# Patient Record
Sex: Female | Born: 1975 | Race: Black or African American | Hispanic: No | Marital: Single | Smoking: Former smoker
Health system: Southern US, Community
[De-identification: ages and names within clinical notes are randomized; demographics above are authoritative.]

## PROBLEM LIST (undated history)

## (undated) DIAGNOSIS — C801 Malignant (primary) neoplasm, unspecified: Secondary | ICD-10-CM

## (undated) DIAGNOSIS — Z800 Family history of malignant neoplasm of digestive organs: Secondary | ICD-10-CM

## (undated) DIAGNOSIS — M1990 Unspecified osteoarthritis, unspecified site: Secondary | ICD-10-CM

## (undated) DIAGNOSIS — Z803 Family history of malignant neoplasm of breast: Secondary | ICD-10-CM

## (undated) HISTORY — DX: Unspecified osteoarthritis, unspecified site: M19.90

## (undated) HISTORY — DX: Family history of malignant neoplasm of digestive organs: Z80.0

## (undated) HISTORY — DX: Malignant (primary) neoplasm, unspecified: C80.1

## (undated) HISTORY — DX: Family history of malignant neoplasm of breast: Z80.3

---
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
## 2018-09-30 DIAGNOSIS — C50912 Malignant neoplasm of unspecified site of left female breast: Secondary | ICD-10-CM | POA: Diagnosis not present

## 2018-09-30 DIAGNOSIS — N644 Mastodynia: Secondary | ICD-10-CM | POA: Diagnosis not present

## 2018-09-30 DIAGNOSIS — R05 Cough: Secondary | ICD-10-CM | POA: Diagnosis not present

## 2018-09-30 DIAGNOSIS — F17200 Nicotine dependence, unspecified, uncomplicated: Secondary | ICD-10-CM | POA: Diagnosis not present

## 2018-09-30 DIAGNOSIS — R0602 Shortness of breath: Secondary | ICD-10-CM | POA: Diagnosis not present

## 2018-09-30 DIAGNOSIS — C801 Malignant (primary) neoplasm, unspecified: Secondary | ICD-10-CM | POA: Diagnosis not present

## 2018-09-30 DIAGNOSIS — J90 Pleural effusion, not elsewhere classified: Secondary | ICD-10-CM | POA: Diagnosis not present

## 2018-09-30 DIAGNOSIS — C7800 Secondary malignant neoplasm of unspecified lung: Secondary | ICD-10-CM | POA: Diagnosis not present

## 2018-09-30 DIAGNOSIS — C7981 Secondary malignant neoplasm of breast: Secondary | ICD-10-CM | POA: Diagnosis not present

## 2018-10-10 ENCOUNTER — Ambulatory Visit (INDEPENDENT_AMBULATORY_CARE_PROVIDER_SITE_OTHER): Payer: BLUE CROSS/BLUE SHIELD | Admitting: Family Medicine

## 2018-10-10 ENCOUNTER — Encounter: Payer: Self-pay | Admitting: Family Medicine

## 2018-10-10 VITALS — BP 148/88 | HR 96 | Temp 99.7°F | Ht 64.0 in | Wt 270.0 lb

## 2018-10-10 DIAGNOSIS — Z7689 Persons encountering health services in other specified circumstances: Secondary | ICD-10-CM

## 2018-10-10 DIAGNOSIS — C50919 Malignant neoplasm of unspecified site of unspecified female breast: Secondary | ICD-10-CM | POA: Insufficient documentation

## 2018-10-10 DIAGNOSIS — N6320 Unspecified lump in the left breast, unspecified quadrant: Secondary | ICD-10-CM | POA: Diagnosis not present

## 2018-10-10 DIAGNOSIS — C50819 Malignant neoplasm of overlapping sites of unspecified female breast: Secondary | ICD-10-CM

## 2018-10-10 NOTE — Progress Notes (Signed)
Subjective:  Patient ID: Sharon Floyd, female    DOB: 1976/06/14, 43 y.o.   MRN: [id number]  Chief Complaint:  Establish Care (went to [facility] ER, needs referral)   HPI: Sharon Floyd is a 43 y.o. female presenting on 10/10/2018 for Establish Care (went to [facility] ER, needs referral)   1. Left breast mass   2. Encounter to establish care   Pt presents today to establish care and to get a referral to oncology. States she was seen at [facility] in the ED on 09/30/2018 for ongoing cough and left upper chest pain with left breast swelling. Pt states she was told she had metastatic breast cancer. She states she noticed left breast swelling 2 days prior to her ED visit.  States she had had intermittent left breast swelling with slight tenderness, she states this always subsided. Pt states she has had the cough and exertional shortness of breath since 09/28/2018. States she has not had fever, chills, unexplained weight loss, or weakness. She states her appetite has been great. States she does have a difficult time getting comfortable to sleep due to the breast swelling and pressure. She states she has not noticed any drainage from her breast. No significant family history of cancer. She stases her last PAP was normal. She has had a colposcopy in the past due to an abnormal PAP. She states that was years ago. She denies use of birth control or hormones. States she use to get Depo injections but stopped these several years ago.  Relevant past medical, surgical, family, and social history reviewed and updated as indicated.  Allergies and medications reviewed and updated.   Past Medical History:  Diagnosis Date  . Arthritis   . Cancer [facility])    breast    Past Surgical History:  Procedure Laterality Date  . PATELLA ARTHROPLASTY Left 1992    Social History   Socioeconomic History  . Marital status: Single    Spouse name: Not on file  . Number of children: Not on file  .  Years of education: Not on file  . Highest education level: Not on file  Occupational History  . Not on file  Social Needs  . Financial resource strain: Not on file  . Food insecurity:    Worry: Not on file    Inability: Not on file  . Transportation needs:    Medical: Not on file    Non-medical: Not on file  Tobacco Use  . Smoking status: Former Smoker    Types: Cigarettes    Last attempt to quit: 10/09/2018  . Smokeless tobacco: Never Used  Substance and Sexual Activity  . Alcohol use: Not Currently  . Drug use: Never  . Sexual activity: Not on file  Lifestyle  . Physical activity:    Days per week: Not on file    Minutes per session: Not on file  . Stress: Not on file  Relationships  . Social connections:    Talks on phone: Not on file    Gets together: Not on file    Attends religious service: Not on file    Active member of club or organization: Not on file    Attends meetings of clubs or organizations: Not on file    Relationship status: Not on file  . Intimate partner violence:    Fear of current or ex partner: Not on file    Emotionally abused: Not on file    Physically abused: Not  on file    Forced sexual activity: Not on file  Other Topics Concern  . Not on file  Social History Narrative  . Not on file    Outpatient Encounter Medications as of 10/10/2018  Medication Sig  . naproxen sodium (ALEVE) 220 MG tablet Take 220 mg by mouth daily as needed.   No facility-administered encounter medications on file as of 10/10/2018.     Allergies  Allergen Reactions  . Aspirin     Review of Systems  Constitutional: Negative for activity change, appetite change, chills, fatigue, fever and unexpected weight change.  Respiratory: Positive for cough, chest tightness, shortness of breath and wheezing.   Cardiovascular: Negative for chest pain, palpitations and leg swelling.  Gastrointestinal: Negative for abdominal pain.  Genitourinary: Negative for pelvic pain,  vaginal bleeding and vaginal pain.  Neurological: Negative for dizziness, weakness and headaches.  Hematological: Positive for adenopathy.  Psychiatric/Behavioral: Negative for confusion.  All other systems reviewed and are negative.       Objective:  BP (!) 148/88   Pulse 96   Temp 99.7 F (37.6 C) (Oral)   Ht 5\' 4"  (1.626 m)   Wt 270 lb (122.5 kg)   BMI 46.35 kg/m    Wt Readings from Last 3 Encounters:  10/10/18 270 lb (122.5 kg)    Physical Exam Vitals signs and nursing note reviewed.  Constitutional:      General: She is not in acute distress.    Appearance: Normal appearance. She is obese. She is not ill-appearing or toxic-appearing.  HENT:     Head: Normocephalic and atraumatic.     Mouth/Throat:     Mouth: Mucous membranes are moist.     Pharynx: Oropharynx is clear.  Eyes:     Conjunctiva/sclera: Conjunctivae normal.     Pupils: Pupils are equal, round, and reactive to light.  Neck:     Musculoskeletal: Full passive range of motion without pain and neck supple.     Thyroid: No thyroid mass, thyromegaly or thyroid tenderness.  Cardiovascular:     Rate and Rhythm: Normal rate and regular rhythm.     Heart sounds: Normal heart sounds. No murmur. No friction rub. No gallop.   Pulmonary:     Effort: Respiratory distress (exertional shortness of breath) present.     Breath sounds: Examination of the right-upper field reveals wheezing. Examination of the left-upper field reveals wheezing. Wheezing (mild) present.  Chest:     Breasts: Breasts are asymmetrical.        Right: Normal.        Left: Swelling, mass (hard mass to upper breast quadrant), skin change (peau d' orange to entire breast) and tenderness present. No bleeding or nipple discharge.  Lymphadenopathy:     Cervical: No cervical adenopathy.     Upper Body:     Right upper body: No supraclavicular, axillary or pectoral adenopathy.     Left upper body: Supraclavicular adenopathy and axillary adenopathy  present.  Skin:    General: Skin is warm and dry.     Capillary Refill: Capillary refill takes less than 2 seconds.  Neurological:     Mental Status: She is alert and oriented to person, place, and time.  Psychiatric:        Mood and Affect: Mood normal.        Behavior: Behavior is cooperative.        Thought Content: Thought content normal.        Judgment: Judgment normal.  No results found for this or any previous visit.     Pertinent labs & imaging results that were available during my care of the patient were reviewed by me and considered in my medical decision making.  Assessment & Plan:  Anasia was seen today for establish care.  Diagnoses and all orders for this visit:  Left breast mass Urgent referral to oncology. Pt needs CPE. States she will set this up once she sees oncology and develops a treatment plan.  -     Ambulatory referral to Oncology    Continue all other maintenance medications.  Follow up plan: Return for CPE, ASAP.  Educational handout given for breast cancer  The above assessment and management plan was discussed with the patient. The patient verbalized understanding of and has agreed to the management plan. Patient is aware to call the clinic if symptoms persist or worsen. Patient is aware when to return to the clinic for a follow-up visit. Patient educated on when it is appropriate to go to the emergency department.   Monia Pouch, FNP-C [facility] [phone]

## 2018-10-10 NOTE — Patient Instructions (Signed)
Breast Cancer, Female  Breast cancer is a malignant growth of tissue (tumor) in the breast. Unlike noncancerous (benign) tumors, malignant tumors are cancerous and can spread to other parts of the body. The two most common types of breast cancer start in the milk ducts (ductal carcinoma) or in the lobules where milk is made in the breast (lobular carcinoma). Breast cancer is one of the most common types of cancer in women. What are the causes? The exact cause of female breast cancer is unknown. What increases the risk? The following factors may make you more likely to develop this condition:  Being older than 44 years of age.  Race and ethnicity. Caucasian women generally have an increased risk, but African-American women are more likely to develop the disease before age 18.  Having a family history of breast cancer.  Having had breast cancer in the past.  Having certain noncancerous conditions of the breast, such as dense breast tissue.  Having the BRCA1 and BRCA2 genes.  Having a history of radiation exposure.  Obesity.  Starting menopause after age 64.  Starting your menstrual periods before age 10.  Having never been pregnant or having your first child after age 53.  Having never breastfed.  Using hormone therapy after menopause.  Using birth control pills.  Drinking more than one alcoholic drink a day.  Exposure to the drug DES, which was given to pregnant women from the 1940s to the 1970s. What are the signs or symptoms? Symptoms of this condition include:  A painless lump or thickening in your breast.  Changes in the size or shape of your breast.  Breast skin changes, such as puckering or dimpling.  Nipple abnormalities, such as scaling, crustiness, redness, or pulling in (retraction).  Nipple discharge that is bloody or clear. How is this diagnosed? This condition may be diagnosed by:  Taking your medical history and doing a physical exam. During the  exam, your health care provider will feel the tissue around your breast and under your arms.  Taking a sample of nipple discharge. The sample will be examined under a microscope.  Performing imaging tests, such as breast X-rays (mammogram), breast ultrasound exams, or an MRI.  Taking a tissue sample (biopsy) from the breast. The sample will be examined under a microscope to look for cancer cells.  Taking a sample from the lymph nodes near the affected breast (sentinel node biopsy). Your cancer will be staged to determine its severity and extent. Staging is a careful attempt to find out the size of the tumor, whether the cancer has spread, and if so, to what parts of the body. Staging also includes testing your tumor for certain receptors, such as estrogen, progesterone, and human epidermal growth factor receptor 2 (HER2). This will help your cancer care team decide on a treatment that will work best for you. You may need to have more tests to determine the stage of your cancer. Stages include the following:  Stage 0-The tumor has not spread to other breast tissue.  Stage I-The cancer is only found in the breast or may be in the lymph nodes. The tumor may be up to  in (2 cm) wide.  Stage II-The cancer has spread to nearby lymph nodes. The tumor may be up to 2 in (5 cm) wide.  Stage III-The cancer has spread to more distant lymph nodes. The tumor may be larger than 2 in (5 cm) wide.  Stage IV-The cancer has spread to other parts of  the body, such as the bones, brain, liver, or lungs. How is this treated? Treatment for this condition depends on the type and stage of the breast cancer. It may be treated with:  Surgery. This may involve breast-conserving surgery (lumpectomy or partial mastectomy) in which only the part of the breast containing the cancer is removed. Some normal tissue surrounding this area may also be removed. In some cases, surgery may be done to remove the entire breast  (mastectomy) and nipple. Lymph nodes may also be removed.  Radiation therapy, which uses high-energy rays to kill cancer cells.  Chemotherapy, which is the use of drugs to kill cancer cells.  Hormone therapy, which involves taking medicine to adjust the hormone levels in your body. You may take medicine to decrease your estrogen levels. This can help stop cancer cells from growing.  Targeted therapy, in which drugs are used to block the growth and spread of cancer cells. These drugs target a specific part of the cancer cell and usually cause fewer side effects than chemotherapy. Targeted therapy may be used alone or in combination with chemotherapy.  A combination of surgery, radiation, chemotherapy, or hormone therapy may be needed to treat breast cancer. Follow these instructions at home:  Take over-the-counter and prescription medicines only as told by your health care provider.  Eat a healthy diet. A healthy diet includes lots of fruits and vegetables, low-fat dairy products, lean meats, and fiber. ? Make sure half your plate is filled with fruits or vegetables. ? Choose high-fiber foods such as whole-grain breads and cereals.  Consider joining a support group. This may help you learn to cope with the stress of having breast cancer.  Talk to your health care team about exercise and physical activity. The right exercise program can: ? Help prevent or reduce symptoms such as fatigue or depression. ? Improve overall health and survival rates.  Keep all follow-up visits as told by your health care provider. This is important. Where to find more information  American Cancer Society: www.cancer.org  National Cancer Institute: www.cancer.gov Contact a health care provider if:  You have a sudden increase in pain.  You have any symptoms or changes that concern you.  You lose weight without trying.  You notice a new lump in either breast or under your arm.  You develop swelling in  either arm or hand.  You have a fever.  You notice new fatigue or weakness. Get help right away if:  You have chest pain or trouble breathing.  You faint. Summary  Breast cancer is a malignant growth of tissue (tumor) in the breast.  Your cancer will be staged to determine its severity and extent.  Treatment for this condition depends on the type and stage of the breast cancer. This information is not intended to replace advice given to you by your health care provider. Make sure you discuss any questions you have with your health care provider. Document Released: 11/03/2005 Document Revised: 03/21/2017 Document Reviewed: 03/21/2017 Elsevier Interactive Patient Education  2019 Elsevier Inc.  

## 2018-10-11 ENCOUNTER — Encounter: Payer: Self-pay | Admitting: *Deleted

## 2018-10-11 ENCOUNTER — Other Ambulatory Visit: Payer: Self-pay

## 2018-10-11 ENCOUNTER — Encounter: Payer: Self-pay | Admitting: Hematology

## 2018-10-11 NOTE — Progress Notes (Signed)
Reached out to [employer] to introduce myself as the office RN Navigator and explain our new patient process. Reviewed the reason for their referral and scheduled their new patient appointment along with labs. Provided address and directions to the office including call back phone number. Reviewed with patient any concerns they may have or any possible barriers to attending their appointment.   Informed patient about my role as a navigator and that I will meet with them prior to their New Patient appointment and more fully discuss what services I can provide. At this time patient has no further questions or needs.

## 2018-10-12 ENCOUNTER — Ambulatory Visit (INDEPENDENT_AMBULATORY_CARE_PROVIDER_SITE_OTHER): Payer: BLUE CROSS/BLUE SHIELD | Admitting: Pulmonary Disease

## 2018-10-12 ENCOUNTER — Inpatient Hospital Stay (HOSPITAL_BASED_OUTPATIENT_CLINIC_OR_DEPARTMENT_OTHER): Payer: BLUE CROSS/BLUE SHIELD | Admitting: Hematology

## 2018-10-12 ENCOUNTER — Other Ambulatory Visit: Payer: Self-pay

## 2018-10-12 ENCOUNTER — Encounter: Payer: Self-pay | Admitting: Pulmonary Disease

## 2018-10-12 ENCOUNTER — Encounter: Payer: Self-pay | Admitting: Hematology

## 2018-10-12 ENCOUNTER — Inpatient Hospital Stay: Payer: BLUE CROSS/BLUE SHIELD | Attending: Hematology

## 2018-10-12 ENCOUNTER — Encounter: Payer: Self-pay | Admitting: *Deleted

## 2018-10-12 ENCOUNTER — Other Ambulatory Visit: Payer: Self-pay | Admitting: Hematology

## 2018-10-12 ENCOUNTER — Telehealth: Payer: Self-pay | Admitting: Hematology

## 2018-10-12 VITALS — BP 141/91 | HR 103 | Ht 65.0 in | Wt 280.0 lb

## 2018-10-12 VITALS — BP 145/99 | HR 101 | Resp 21 | Ht 65.0 in | Wt 268.5 lb

## 2018-10-12 DIAGNOSIS — C7931 Secondary malignant neoplasm of brain: Secondary | ICD-10-CM | POA: Insufficient documentation

## 2018-10-12 DIAGNOSIS — C7802 Secondary malignant neoplasm of left lung: Secondary | ICD-10-CM | POA: Insufficient documentation

## 2018-10-12 DIAGNOSIS — Z800 Family history of malignant neoplasm of digestive organs: Secondary | ICD-10-CM | POA: Diagnosis not present

## 2018-10-12 DIAGNOSIS — Z79899 Other long term (current) drug therapy: Secondary | ICD-10-CM | POA: Diagnosis not present

## 2018-10-12 DIAGNOSIS — N63 Unspecified lump in unspecified breast: Secondary | ICD-10-CM | POA: Diagnosis not present

## 2018-10-12 DIAGNOSIS — N6320 Unspecified lump in the left breast, unspecified quadrant: Secondary | ICD-10-CM

## 2018-10-12 DIAGNOSIS — C7801 Secondary malignant neoplasm of right lung: Secondary | ICD-10-CM | POA: Insufficient documentation

## 2018-10-12 DIAGNOSIS — Z87891 Personal history of nicotine dependence: Secondary | ICD-10-CM | POA: Insufficient documentation

## 2018-10-12 DIAGNOSIS — Z803 Family history of malignant neoplasm of breast: Secondary | ICD-10-CM | POA: Insufficient documentation

## 2018-10-12 DIAGNOSIS — J910 Malignant pleural effusion: Secondary | ICD-10-CM | POA: Insufficient documentation

## 2018-10-12 DIAGNOSIS — Z809 Family history of malignant neoplasm, unspecified: Secondary | ICD-10-CM

## 2018-10-12 DIAGNOSIS — C50919 Malignant neoplasm of unspecified site of unspecified female breast: Secondary | ICD-10-CM

## 2018-10-12 DIAGNOSIS — C50812 Malignant neoplasm of overlapping sites of left female breast: Secondary | ICD-10-CM

## 2018-10-12 DIAGNOSIS — D649 Anemia, unspecified: Secondary | ICD-10-CM | POA: Diagnosis not present

## 2018-10-12 DIAGNOSIS — G893 Neoplasm related pain (acute) (chronic): Secondary | ICD-10-CM | POA: Insufficient documentation

## 2018-10-12 DIAGNOSIS — R918 Other nonspecific abnormal finding of lung field: Secondary | ICD-10-CM

## 2018-10-12 DIAGNOSIS — F17200 Nicotine dependence, unspecified, uncomplicated: Secondary | ICD-10-CM

## 2018-10-12 LAB — CMP (CANCER CENTER ONLY)
ALT: 19 U/L (ref 0–44)
AST: 20 U/L (ref 15–41)
Albumin: 4.1 g/dL (ref 3.5–5.0)
Alkaline Phosphatase: 65 U/L (ref 38–126)
Anion gap: 9 (ref 5–15)
BUN: 11 mg/dL (ref 6–20)
CO2: 27 mmol/L (ref 22–32)
Calcium: 9.8 mg/dL (ref 8.9–10.3)
Chloride: 103 mmol/L (ref 98–111)
Creatinine: 0.99 mg/dL (ref 0.44–1.00)
GFR, Est AFR Am: >60 mL/min (ref >60)
GFR, Estimated: >60 mL/min (ref >60)
Glucose, Bld: 111 mg/dL — ABNORMAL HIGH (ref 70–99)
Potassium: 4.2 mmol/L (ref 3.5–5.1)
Sodium: 139 mmol/L (ref 135–145)
Total Bilirubin: 0.5 mg/dL (ref 0.3–1.2)
Total Protein: 7.7 g/dL (ref 6.5–8.1)

## 2018-10-12 LAB — CBC WITH DIFFERENTIAL (CANCER CENTER ONLY)
Abs Immature Granulocytes: 0.04 10*3/uL (ref 0.00–0.07)
Basophils Absolute: 0 10*3/uL (ref 0.0–0.1)
Basophils Relative: 0 %
EOS PCT: 0 %
Eosinophils Absolute: 0 10*3/uL (ref 0.0–0.5)
HCT: 40.3 % (ref 36.0–46.0)
Hemoglobin: 13 g/dL (ref 12.0–15.0)
Immature Granulocytes: 1 %
Lymphocytes Relative: 20 %
Lymphs Abs: 1.5 10*3/uL (ref 0.7–4.0)
MCH: 29.1 pg (ref 26.0–34.0)
MCHC: 32.3 g/dL (ref 30.0–36.0)
MCV: 90.4 fL (ref 80.0–100.0)
Monocytes Absolute: 0.8 10*3/uL (ref 0.1–1.0)
Monocytes Relative: 10 %
Neutro Abs: 5.1 10*3/uL (ref 1.7–7.7)
Neutrophils Relative %: 69 %
Platelet Count: 504 10*3/uL — ABNORMAL HIGH (ref 150–400)
RBC: 4.46 MIL/uL (ref 3.87–5.11)
RDW: 12.5 % (ref 11.5–15.5)
WBC Count: 7.4 10*3/uL (ref 4.0–10.5)
nRBC: 0 % (ref 0.0–0.2)

## 2018-10-12 MED ORDER — LORAZEPAM 0.5 MG PO TABS: 0.2500 mg | ORAL_TABLET | Freq: Once | ORAL | 0 refills | Status: DC | PRN

## 2018-10-12 NOTE — Telephone Encounter (Signed)
Scheduled appt per 3/5 sch message - -pt aware of appt date and time   

## 2018-10-12 NOTE — Patient Instructions (Addendum)
Breast mass and multiple lung masses: > we are going to arrange for a breast mass biopsy

## 2018-10-12 NOTE — Progress Notes (Signed)
Synopsis: Referred in March 2020 for evaluation of a breast mass with multiple lung metastases.  Subjective:   PATIENT ID: Sharon Floyd GENDER: female DOB: 09/28/1975, MRN: [id number]   HPI  Chief Complaint  Patient presents with  . Pulm Consult    Referred by Dr. Maylon Peppers.     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  She has smoked cigarettes up until about 2 days ago, less than a pack a day for many years.  She says that as a child she had asthma but she was never told that she had any severe symptoms.  She has considered herself healthy for most of her adult life.  However, she says that after her last menstrual period she had a persistence of pain and swelling in her left breast which was more than normal and then she developed a cough.  She went to be evaluated at an outside facility where she was found to have a lung mass as well as a large mass invading her pectoral muscle.  A CT scan was performed which showed evidence of a large mediastinal mass.  She was referred to oncology who asked that we see her today.  She says that she does not take any blood thinners.  She says that her cough will sometimes be productive, she does not have hemoptysis, fevers, chills.  She says that she feels like she is short of breath because there is a mass pressing down on the left side of her chest.  Past Medical History:  Diagnosis Date  . Arthritis   . Cancer [facility])    breast     Family History  Problem Relation Age of Onset  . Arthritis Mother   . Diabetes Father   . Hypertension Father   . Arthritis Father   . Heart disease Father   . Early death Maternal Grandmother   . Asthma Maternal Grandfather   . Heart disease Maternal Grandfather   . Migraines Maternal Grandfather   . Early death Maternal Grandfather   . Asthma Paternal Grandmother   . Hypertension Paternal Grandmother   .  Asthma Paternal Grandfather   . Hypertension Paternal Grandfather      Social History   Socioeconomic History  . Marital status: Single    Spouse name: Not on file  . Number of children: Not on file  . Years of education: Not on file  . Highest education level: Not on file  Occupational History  . Not on file  Social Needs  . Financial resource strain: Not on file  . Food insecurity:    Worry: Not on file    Inability: Not on file  . Transportation needs:    Medical: Not on file    Non-medical: Not on file  Tobacco Use  . Smoking status: Former Smoker    Types: Cigarettes    Last attempt to quit: 10/09/2018  . Smokeless tobacco: Never Used  Substance and Sexual Activity  . Alcohol use: Not Currently  . Drug use: Never  . Sexual activity: Not on file  Lifestyle  . Physical activity:    Days per week: Not on file    Minutes per session: Not on file  . Stress: Not on file  Relationships  . Social connections:    Talks on phone: Not on file    Gets together: Not on file  Attends religious service: Not on file    Active member of club or organization: Not on file    Attends meetings of clubs or organizations: Not on file    Relationship status: Not on file  . Intimate partner violence:    Fear of current or ex partner: Not on file    Emotionally abused: Not on file    Physically abused: Not on file    Forced sexual activity: Not on file  Other Topics Concern  . Not on file  Social History Narrative  . Not on file     Allergies  Allergen Reactions  . Aspirin      Outpatient Medications Prior to Visit  Medication Sig Dispense Refill  . LORazepam (ATIVAN) 0.5 MG tablet Take 0.5 tablets (0.25 mg total) by mouth once as needed for up to 2 doses for anxiety (Prior to PET and MRI scan). 1 tablet 0  . naproxen sodium (ALEVE) 220 MG tablet Take 220 mg by mouth daily as needed.     No facility-administered medications prior to visit.     Review of Systems    Constitutional: Positive for malaise/fatigue. Negative for chills, fever and weight loss.  HENT: Negative for congestion, nosebleeds, sinus pain and sore throat.   Eyes: Negative for photophobia, pain and discharge.  Respiratory: Positive for cough, sputum production and shortness of breath. Negative for hemoptysis and wheezing.   Cardiovascular: Negative for chest pain, palpitations, orthopnea and leg swelling.  Gastrointestinal: Negative for abdominal pain, constipation, diarrhea, nausea and vomiting.  Genitourinary: Negative for dysuria, frequency, hematuria and urgency.  Musculoskeletal: Negative for back pain, joint pain, myalgias and neck pain.  Skin: Negative for itching and rash.  Neurological: Negative for tingling, tremors, sensory change, speech change, focal weakness, seizures, weakness and headaches.  Psychiatric/Behavioral: Negative for memory loss, substance abuse and suicidal ideas. The patient is not nervous/anxious.       Objective:  Physical Exam   Vitals:   10/12/18 1411  BP: (!) 141/91  Pulse: (!) 103  SpO2: 92%  Weight: 280 lb (127 kg)  Height: 5\' 5"  (1.651 m)   Gen: well appearing, no acute distress HENT: NCAT, OP clear, neck supple without masses Eyes: PERRL, EOMi Lymph: no cervical lymphadenopathy PULM: CTA B CV: RRR, no mgr, no JVD GI: BS+, soft, nontender, no hsm Derm: no rash or skin breakdown MSK: normal bulk and tone Neuro: A&Ox4, CN II-XII intact, strength 5/5 in all 4 extremities Psyche: normal mood and affect   CBC    Component Value Date/Time   WBC 7.4 10/12/2018 1030   RBC 4.46 10/12/2018 1030   HGB 13.0 10/12/2018 1030   HCT 40.3 10/12/2018 1030   PLT 504 (H) 10/12/2018 1030   MCV 90.4 10/12/2018 1030   MCH 29.1 10/12/2018 1030   MCHC 32.3 10/12/2018 1030   RDW 12.5 10/12/2018 1030   LYMPHSABS 1.5 10/12/2018 1030   MONOABS 0.8 10/12/2018 1030   EOSABS 0.0 10/12/2018 1030   BASOSABS 0.0 10/12/2018 1030     Chest  imaging: February 2020 CT chest images independently reviewed showing innumerable pulmonary masses bilaterally, scattered nodules, bronchovascular distribution, there is mediastinal and hilar adenopathy seen as well.  PFT:  Labs:  Path:  Echo:  Heart Catheterization:       Assessment & Plan:   Breast mass in female - Plan: [place] FNA SOFT TISSUE  Lung mass  Smoker  Discussion: Orvan Falconer presents today for evaluation of multiple pulmonary masses as well as  mediastinal adenopathy in the setting of a large breast mass.  We are happy to perform a navigational bronchoscopy and endoscopic ultrasound guided needle aspiration of her lymphadenopathy to obtain a tissue diagnosis.  Based on her smoking history and the need to obtain ample tissue for molecular and genetic studies  Plan: Breast mass and multiple lung masses: > After discussing the images with my partners we think the best approach is to obtain a core biopsy of the left breast mass > If we are unable to obtain enough tissue for molecular/hormonal receptor testing then we are happy to perform a bronchoscopy to obtain tissue from the lung metastasis.    Current Outpatient Medications:  .  LORazepam (ATIVAN) 0.5 MG tablet, Take 0.5 tablets (0.25 mg total) by mouth once as needed for up to 2 doses for anxiety (Prior to PET and MRI scan)., Disp: 1 tablet, Rfl: 0 .  naproxen sodium (ALEVE) 220 MG tablet, Take 220 mg by mouth daily as needed., Disp: , Rfl:

## 2018-10-12 NOTE — Progress Notes (Signed)
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 -Late 09/2018: presented to [facility] ER for several weeks of cough; exam notable for left breast skin changes; CT showed dermal thickening and multiple masses within the left breast and a 6cm mass invading the left pectoralis muscle, consistent with primary breast malignancy, extensive thoracic adenopathy and numerous pulmonary metastases, and an indeterminate subcentimeter lucency in the dome of right liver  ASSESSMENT & PLAN:   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  She underwent CT chest, which showed dermal thickening and multiple masses within the left breast as well as a 6 cm mass invading the left pectoralis muscle, consistent with breast malignancy.  In addition, there was extensive thoracic adenopathy and multiple pulmonary metastases as well as an indeterminant subcentimeter lucency in the dome of the right liver. -I reviewed imaging results in detail with the patient -Given the suspected extensive pulmonary metastases and thoracic adenopathy, I have referred the patient to pulmonary medicine for urgent evaluation of biopsy to confirm malignancy and for hormone receptor studies, assuming that this is primary breast malignancy -In addition, I have ordered PET scan and MRI brain to assess for evidence of other metastatic  disease -Anticipating that she is going to require chemotherapy, I have also ordered TTE to assess baseline cardiac function  -Pending the imaging studies and biopsy results, we will determine the treatment options and prognosis  Family history of malignancy  -There is no 1st degree relative with history of cancer, but there is malignancy in more distant relatives, including a 2nd degree aunt (colon cancer) and her aunt's son (breast cancer) -Therefore, I have referred the patient to genetics for evaluation of any hereditary syndrome, such as BRCA mutation   Orders Placed This Encounter  Procedures  . Ambulatory referral to Genetics    Referral Priority:   Urgent    Referral Type:   Consultation    Referral Reason:   Specialty Services Required    Number of Visits Requested:   1  . ECHOCARDIOGRAM COMPLETE    Standing Status:   Future    Standing Expiration Date:   01/12/2020    Order Specific Question:   Where should this test be performed    Answer:   [facility]    Order Specific Question:   Perflutren DEFINITY (image enhancing agent) should be administered unless hypersensitivity or allergy exist    Answer:   Administer Perflutren    Order Specific Question:   Reason for exam-Echo    Answer:   Chemotherapy evaluation  v87.41 / v58.11    Order Specific Question:   Reason for exam-Echo    Answer:   Chest Pain  786.50 / R07.9   A total of more than 60 minutes were spent face-to-face with the patient during this encounter and over half of that time was spent on counseling and coordination of care as outlined above.    All questions were  answered. The patient knows to call the clinic with any problems, questions or concerns.  Return in 1 week for clinic follow-up of the work-up above.   Tish Men, MD 10/12/2018 11:52 AM   CHIEF COMPLAINTS/PURPOSE OF CONSULTATION:  "My left breast is very uncomfortable"  HISTORY OF PRESENTING ILLNESS:  Sharon Floyd 43 y.o. female is here  because of suspected metastatic breast cancer to the lungs.  Patient presented to [facility] ED in 09/2018 for several weeks of cough, left upper chest pain, and left breast swelling.  On exam, her left breast was noticeably enlarged with skin changes, concerning for inflammatory breast cancer.  She underwent CT chest, which unfortunately showed extensive pulmonary metastases and thoracic adenopathy, as well as an indeterminant lucency in the dome of the right liver.  Patient was referred to oncology for further evaluation.  Patient reports that she has some difficulty with breathing at night when she lies flat due to the large left breast mass that weighs on her chest wall, and she has to reposition herself several times over the course of the night to be able to sleep.  She has tried breast binder without any relief.  She is on Aleve twice a day.  In addition, she reports mild exertional dyspnea over the past week.  She denies any fever, chill, night sweats, persistent chest pain, abdominal pain, nausea, vomiting, diarrhea.  I have reviewed her chart and materials related to her cancer extensively and collaborated history with the patient. Summary of oncologic history is as follows:   Breast cancer ([place])   09/30/2018 Imaging    CT chest with contrast (at [facility]): Impressions: 1.  Dermal thickening of the multiple masses within the left breast as well as a invasive 6 cm mass of the left pectoralis major, compatible with primary breast cancer. 2.  Numerous pulmonary metastases.  Left axillary, retropectoral, supraclavicular, and upper mediastinal metastatic lymphadenopathy.  Mildly enlarged right axillary and supraclavicular lymph nodes may be metastatic. 3.  Indeterminant subcentimeter lucency in the dome of the right liver. 4.  Small left pleural effusion.    10/10/2018 Initial Diagnosis    Breast cancer [facility])     MEDICAL HISTORY:  Past Medical History:  Diagnosis Date  . Arthritis    . Cancer [facility])    breast    SURGICAL HISTORY: Past Surgical History:  Procedure Laterality Date  . PATELLA ARTHROPLASTY Left 1992    SOCIAL HISTORY: Social History   Socioeconomic History  . Marital status: Single    Spouse name: Not on file  . Number of children: Not on file  . Years of education: Not on file  . Highest education level: Not on file  Occupational History  . Not on file  Social Needs  . Financial resource strain: Not on file  . Food insecurity:    Worry: Not on file    Inability: Not on file  . Transportation needs:    Medical: Not on file    Non-medical: Not on file  Tobacco Use  . Smoking status: Former Smoker    Types: Cigarettes    Last attempt to quit: 10/09/2018  . Smokeless tobacco: Never Used  Substance and Sexual Activity  . Alcohol use: Not Currently  . Drug use: Never  . Sexual activity: Not on file  Lifestyle  . Physical activity:    Days per week: Not on file    Minutes per session: Not on file  . Stress: Not on file  Relationships  . Social connections:    Talks on phone: Not on file    Gets together: Not on file    Attends religious service: Not on file    Active member of club or organization: Not on file    Attends meetings of clubs or organizations: Not on file    Relationship status: Not on file  . Intimate partner violence:    Fear of current or ex partner: Not on file    Emotionally abused: Not on file    Physically abused: Not on file    Forced sexual activity: Not on file  Other Topics Concern  . Not on file  Social History Narrative  . Not on file    FAMILY HISTORY: Family History  Problem Relation Age of Onset  . Arthritis Mother   . Diabetes Father   . Hypertension Father   . Arthritis Father   . Heart disease Father   . Early death Maternal Grandmother   . Asthma Maternal Grandfather   . Heart disease Maternal Grandfather   . Migraines Maternal Grandfather   . Early death Maternal Grandfather   .  Asthma Paternal Grandmother   . Hypertension Paternal Grandmother   . Asthma Paternal Grandfather   . Hypertension Paternal Grandfather     ALLERGIES:  is allergic to aspirin.  MEDICATIONS:  Current Outpatient Medications  Medication Sig Dispense Refill  . naproxen sodium (ALEVE) 220 MG tablet Take 220 mg by mouth daily as needed.    Marland Kitchen LORazepam (ATIVAN) 0.5 MG tablet Take 0.5 tablets (0.25 mg total) by mouth once as needed for up to 2 doses for anxiety (Prior to PET and MRI scan). 1 tablet 0   No current facility-administered medications for this visit.     REVIEW OF SYSTEMS:   Constitutional: ( - ) fevers, ( - )  chills , ( - ) night sweats Eyes: ( - ) blurriness of vision, ( - ) double vision, ( - ) watery eyes Ears, nose, mouth, throat, and face: ( - ) mucositis, ( - ) sore throat Respiratory: ( - ) cough, ( - ) dyspnea, ( - ) wheezes Cardiovascular: ( - ) palpitation, ( - ) chest discomfort, ( - ) lower extremity swelling Gastrointestinal:  ( - ) nausea, ( - ) heartburn, ( - ) change in bowel habits Skin: ( - ) abnormal skin rashes Lymphatics: ( - ) new lymphadenopathy, ( - ) easy bruising Neurological: ( - ) numbness, ( - ) tingling, ( - ) new weaknesses Behavioral/Psych: ( - ) mood change, ( - ) new changes  All other systems were reviewed with the patient and are negative.  PHYSICAL EXAMINATION: ECOG PERFORMANCE STATUS: 1 - Symptomatic but completely ambulatory  Vitals:   10/12/18 1000  BP: (!) 145/99  Pulse: (!) 101  Resp: (!) 21   Filed Weights   10/12/18 1000  Weight: 268 lb 8 oz (121.8 kg)    GENERAL: alert, no distress and comfortable, obese  SKIN: skin color, texture, turgor are normal, no rashes or significant lesions EYES: conjunctiva are pink and non-injected, sclera clear OROPHARYNX: no exudate, no erythema; lips, buccal mucosa, and tongue normal  NECK: supple, non-tender LYMPH:  palpable ~1.5cm left supraclavicular LN and left axillary  adenopathy LUNGS: clear to auscultation with normal breathing effort HEART: regular rate & rhythm, no murmurs, no lower extremity edema ABDOMEN: soft, non-tender, non-distended, normal bowel sounds Musculoskeletal: no cyanosis of digits and no clubbing  PSYCH: alert &  oriented x 3, fluent speech NEURO: no focal motor/sensory deficits BREASTS: a large, firm left breast with extensive peau d'orange changes in the entire breast, right breast unremarkable   LABORATORY DATA:  I have reviewed the data as listed Lab Results  Component Value Date   WBC 7.4 10/12/2018   HGB 13.0 10/12/2018   HCT 40.3 10/12/2018   MCV 90.4 10/12/2018   PLT 504 (H) 10/12/2018   Lab Results  Component Value Date   NA 139 10/12/2018   K 4.2 10/12/2018   CL 103 10/12/2018   CO2 27 10/12/2018    RADIOGRAPHIC STUDIES: I have personally reviewed the radiological images as listed and agreed with the findings in the report.  PATHOLOGY: I have reviewed the pathology reports as documented in the oncologist history.

## 2018-10-12 NOTE — Progress Notes (Signed)
Initial RN Navigator Patient Visit  Name: Sharon Floyd Date of Referral : 10/10/2018 Diagnosis: Metastatic Breast  Met with patient prior to their visit with MD. Hanley Seamen patient "Your Patient Navigator" handout which explains my role, areas in which I am able to help, and all the contact information for myself and the office. Also gave patient MD and Navigator business card. Reviewed with patient the general overview of expected course after initial diagnosis and time frame for all steps to be completed.  Patient completed visit with Dr. Maylon Peppers  Revisited with patient after MD visit. Patient will need  - Referral to Pulmonology - Dr Lake Bells. Scheduled for today at 2p.  - PET - scheduled for 10/18/2018 @ 12p - MRI brain - scheduled for  10/18/2018 @ 9a - Echo - scheduled for 10/19/2018 @ 2:15  The following appointments were made while patient was still here in the office. Calendar was reviewed and given to patient. Patient given instruction on PET and MRI prep.  Patient understands all follow up procedures and expectations. They have my number to reach out for any further clarification or additional needs. Will call patient in 5-7 days to see if any further needs have presented, or if patient has any further questions or needs.

## 2018-10-13 ENCOUNTER — Encounter: Payer: Self-pay | Admitting: *Deleted

## 2018-10-13 ENCOUNTER — Telehealth: Payer: Self-pay | Admitting: Pulmonary Disease

## 2018-10-13 NOTE — Telephone Encounter (Signed)
LMTCB

## 2018-10-13 NOTE — Telephone Encounter (Signed)
Called and spoke with Anderson Malta, she stated that she spoke with both Cherina and BQ at length yesterday in regards to the patient. BQ was under the impression that the patient received a mammogram from [facility]. Patient has not had one. Per the radiologist the patient will need a full workup including a L breast ultrasound and a mammogram. As of right now [place] radiology is short handed and they could not get the patient in until Wednesday of next week however the patient has an appointment in [place] already. Anderson Malta stated that they could get the patient in at [facility] on Tuesday at [facility].    BQ please advise, thank you.

## 2018-10-13 NOTE — Telephone Encounter (Signed)
Called and spoke with patient, see other phone from 10/13/18 advised her of response. Will wait on response from BQ.

## 2018-10-13 NOTE — Progress Notes (Signed)
Received further orders from Dr Maylon Peppers for patient  - Referral to [facility] - scheduled for 10/17/2018  Orders placed by other physicians, but confirmed to be scheduled  - Genetics - scheduled for 10/17/2018 - Breast Center Biopsy - location still unable to work out an [place] appointment. Will follow up next week.   Called patient and spoke to her to confirm and review all appointments. She is aware of all appointments, locations, and reason for referrals. She expressed a concern over financial obligation of her scans ~ $3000. I made a referral to our Financial Counsellor and also reached out to breast navigators at [facility].

## 2018-10-15 ENCOUNTER — Encounter: Payer: Self-pay | Admitting: Hematology

## 2018-10-16 ENCOUNTER — Other Ambulatory Visit: Payer: Self-pay | Admitting: Hematology

## 2018-10-16 ENCOUNTER — Telehealth: Payer: Self-pay | Admitting: Pulmonary Disease

## 2018-10-16 DIAGNOSIS — N6320 Unspecified lump in the left breast, unspecified quadrant: Secondary | ICD-10-CM

## 2018-10-16 DIAGNOSIS — C50919 Malignant neoplasm of unspecified site of unspecified female breast: Secondary | ICD-10-CM

## 2018-10-16 NOTE — Progress Notes (Signed)
Radiation Oncology         [phone] ________________________________  Name: Sharon Floyd        MRN: [id number]  Date of Service: 10/17/2018 DOB: May 09, 1976  KT:GYBWL, Connye Burkitt, FNP  Tish Men, MD     REFERRING PHYSICIAN: Tish Men, MD   DIAGNOSIS: The primary encounter diagnosis was Malignant neoplasm of female breast, unspecified estrogen receptor status, unspecified laterality, unspecified site of breast ([place]). Diagnoses of Fever, unspecified and Shortness of breath were also pertinent to this visit.   HISTORY OF PRESENT ILLNESS: Sharon Floyd is a 43 y.o. female seen at the request of Dr. 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 The patient reports that for several months she's had episodes of cough and progressive edema of the left breast. She noticed in the last 2-3 weeks of having progressive edema and tightness of the left breast. She reports she's had progressive shortness of breath in the last few weeks as well. She did go to the ED at [facility] on 10/02/18 when she had a CXR noting numerous bilateral pulmonary nodules and masses most compatible with pulmonary metastasis and small left effusion. She had a CT without contrast showing multiple masses within the left breast and a 6 cm mass of the left pectoralis major. She had numerous metastatic lesions in the left axilla, retropectoral region, supraclavicular and mediastinal adenopathy. She had indeterminate subcentimeter disease in the dome of rigth liver. She had a small left effusion. She is scheduled for PET and brain MRI tomorrow. She is scheduled for ultrasound of the breast and biopsy on 10/19/2018. She comes today to discuss palliative radiotherapy to the left breast.  PREVIOUS RADIATION THERAPY: No   PAST MEDICAL HISTORY:  Past Medical History:  Diagnosis Date  . Arthritis   . Cancer [facility])    breast  . Family history of breast cancer   . Family history of colon cancer        PAST SURGICAL  HISTORY: Past Surgical History:  Procedure Laterality Date  . PATELLA REALIGNMENT Left 1992     FAMILY HISTORY:  Family History  Problem Relation Age of Onset  . Arthritis Mother   . Diabetes Father   . Hypertension Father   . Arthritis Father   . Heart disease Father   . Early death Maternal Grandmother   . Asthma Maternal Grandfather   . Heart disease Maternal Grandfather   . Migraines Maternal Grandfather   . Early death Maternal Grandfather   . Asthma Paternal Grandmother   . Hypertension Paternal Grandmother   . Asthma Paternal Grandfather   . Hypertension Paternal Grandfather   . Breast cancer Other        paternal cousin, female  . Colon cancer Cousin        paternal  . Breast cancer Cousin        paternal  . Lung cancer Other      SOCIAL HISTORY:  reports that she quit smoking 8 days ago. Her smoking use included cigarettes. She has never used smokeless tobacco. She reports previous alcohol use. She reports that she does not use drugs. The patient is single and lives in [place]. She is accompanied by her mother. 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 She has a son who is 67.    ALLERGIES: Aspirin   MEDICATIONS:  Current Outpatient Medications  Medication Sig Dispense Refill  . LORazepam (ATIVAN) 0.5 MG  tablet Take 0.5 tablets (0.25 mg total) by mouth once as needed for up to 2 doses for anxiety (Prior to PET and MRI scan). 1 tablet 0  . naproxen sodium (ALEVE) 220 MG tablet Take 220 mg by mouth daily as needed.    Marland Kitchen oxyCODONE (OXY IR/ROXICODONE) 5 MG immediate release tablet Take 0.5-1 tablets (2.5-5 mg total) by mouth every 4 (four) hours as needed for severe pain. 60 tablet 0   No current facility-administered medications for this encounter.      REVIEW OF SYSTEMS: On review of systems, the patient reports that she is not doing so well. She is short of breath with rest and exertion and with exertion, has oxygen  saturations that drop into the 70% range. She reports she has terrible chest wall pain at times, and some mild errosion of the left breast along the upper outer quadrant of the left breast. She has a sense of being "smothered" when laying flat. She denies any known fevers prior to coming in. She has been exposed to other people at work with pneumonia and flu.  She has a productive cough with clear to white mucous. She denies unintended weight changes. She denies any bowel or bladder disturbances, and denies abdominal pain, nausea or vomiting. She denies any other new musculoskeletal or joint aches or pains. A complete review of systems is obtained and is otherwise negative.     PHYSICAL EXAM:  Wt Readings from Last 3 Encounters:  10/17/18 267 lb 3.2 oz (121.2 kg)  10/12/18 280 lb (127 kg)  10/12/18 268 lb 8 oz (121.8 kg)   Temp Readings from Last 3 Encounters:  10/17/18 99.1 F (37.3 C) (Oral)  10/10/18 99.7 F (37.6 C) (Oral)   BP Readings from Last 3 Encounters:  10/17/18 119/86  10/12/18 (!) 141/91  10/12/18 (!) 145/99   Pulse Readings from Last 3 Encounters:  10/17/18 (!) 103  10/12/18 (!) 103  10/12/18 (!) 101   Pain Assessment Pain Score: 6  Pain Frequency: Constant Pain Loc: Breast(Left breast- Shooting pain, pins/needles)/10  In general this is a well appearing African American female in mild respiratory distress with tachypnea at a rate of 30 breaths per minute. She is alert and oriented x4 and appropriate throughout the examination. HEENT reveals that the patient is normocephalic, atraumatic. EOMs are intact. Cardiovascular exam reveals a tachycardic rate but normal rhythm. No C/R/M are noted. Her chest reveals absent breath sounds in the upper fields of the left chest. She has normal breath sounds in the right lung fields. Her left breast is firm, enlarged, and very tight. She has an area about 1 cm in the upper outer quadrant of the left breast. She has lidocaine patches  over the breast but does have intact skin below this. The tissue of the breast appears consistent with peau d'orange.        ECOG = 1  0 - Asymptomatic (Fully active, able to carry on all predisease activities without restriction)  1 - Symptomatic but completely ambulatory (Restricted in physically strenuous activity but ambulatory and able to carry out work of a light or sedentary nature. For example, light housework, office work)  2 - Symptomatic, <50% in bed during the day (Ambulatory and capable of all self care but unable to carry out any work activities. Up and about more than 50% of waking hours)  3 - Symptomatic, >50% in bed, but not bedbound (Capable of only limited self-care, confined to bed or chair 50%  or more of waking hours)  4 - Bedbound (Completely disabled. Cannot carry on any self-care. Totally confined to bed or chair)  5 - Death   Eustace Pen MM, Creech RH, Tormey DC, et al. [phone]). "Toxicity and response criteria of the [facility]". [place] Oncol. 5 (6): 649-55    LABORATORY DATA:  Lab Results  Component Value Date   WBC 7.4 10/12/2018   HGB 13.0 10/12/2018   HCT 40.3 10/12/2018   MCV 90.4 10/12/2018   PLT 504 (H) 10/12/2018   Lab Results  Component Value Date   NA 139 10/12/2018   K 4.2 10/12/2018   CL 103 10/12/2018   CO2 27 10/12/2018   Lab Results  Component Value Date   ALT 19 10/12/2018   AST 20 10/12/2018   ALKPHOS 65 10/12/2018   BILITOT 0.5 10/12/2018      RADIOGRAPHY: No results found.     IMPRESSION/PLAN: 1. 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 Dr. Lisbeth Renshaw discusses the imaging findings, exam findings, and nature of breast disease. We would anticipate a course of palliative radiotherapy to the left breast and regional nodes. We discussed the risks, benefits, short, and long term effects of radiotherapy, and the patient is interested in proceeding. Dr. Lisbeth Renshaw discusses the delivery and logistics of  radiotherapy and anticipates a course of 12 fractions of radiotherapy. Written consent is obtained and placed in the chart, a copy was provided to the patient. She will need to coordinate simulation with our staff this week.  2. Shortness of breath in the setting of pulmonary metastases and recent finding of effusion in the pleural space and elevated temperature. We will proceed with rapid Flu testing as well. After discussing her case with Dr. Maylon Peppers, we will proceed to the ED for further work up. We're suspicious of pleural effusion given outside reports versus postobstructive pneumonia. She is in agreement with this plan. 3. Possible genetic predisposition to malignancy. The patient is a candidate for genetic testing given her personal and family history. She was offered referral and has already met with genetic counseling.   In a visit lasting 90 minutes, greater than 50% of the time was spent face to face discussing her case, and coordinating the patient's care.  The above documentation reflects my direct findings during this shared patient visit. Please see the separate note by Dr. Lisbeth Renshaw on this date for the remainder of the patient's plan of care.    Carola Rhine, PAC

## 2018-10-16 NOTE — Telephone Encounter (Signed)
Spoke with Sharon Floyd, she is calling us back to explain the events that happened on Friday. They thought the patient had a a Mammogram at [facility] in February, they looked over the looked over the films and come to find out she didn't have a mammogram but she had a CT chest. [facility] Na had an opening on Tuesday morning that they were trying to hold for pt since this was a time-sensitive matter. Sharon Floyd called and tried to explain what was going on and that she needed to speak to Dr. Lake Bells as soon as possible about the orders and the plan moving forward.   She told the person who answered our phone and took the message that this was important but kept telling her that they would send a message.   Sharon Floyd states they have scheduled for diagnostic mammogram  biopsy, genetic counseling, Pet scan, first thing Thursday morning. The patient is aware. Dr. Maylon Peppers ended up signing off on the orders and just wanted to let you know that everything is handled. If you need to speak to [facility] or Dr. Maylon Peppers, the numbers are listed below.   [phone] Dr. Maylon Peppers [phone]

## 2018-10-16 NOTE — Telephone Encounter (Signed)
Anderson Malta calling back [phone] ext [phone]//kob

## 2018-10-16 NOTE — Telephone Encounter (Signed)
I was told on 3/5 in the afternoon that the team at the breast center was going to review her CT scan report and then advise what procedure was needed.  Would also advise discussing with Dr. Lorette Ang nurse navigator because I think they were going to check on the situation on 3/6 as well.  Either way she needs a biopsy ASAP so if I need to sign an order let me know.

## 2018-10-16 NOTE — Telephone Encounter (Signed)
Attempted to call Anderson Malta again but unable to reach her. Left message for Anderson Malta to return call.

## 2018-10-16 NOTE — Telephone Encounter (Signed)
Message from phone encounter 10/13/2018: Juanito Doom, MD  to [facility] Triage Pool      10:45 AM  Note    I was told on 3/5 in the afternoon that the team at the breast center was going to review her CT scan report and then advise what procedure was needed.  Would also advise discussing with Dr. Lorette Ang nurse navigator because I think they were going to check on the situation on 3/6 as well. Either way she needs a biopsy ASAP so if I need to sign an order let me know.     Attempted to call Anderson Malta at [place] but unable to reach. Left message for Anderson Malta to return call.

## 2018-10-16 NOTE — Telephone Encounter (Signed)
Will close this encounter so we are not documenting on duplicate messages.

## 2018-10-16 NOTE — Telephone Encounter (Signed)
Anderson Malta from the [facility] is calling back [phone]

## 2018-10-16 NOTE — Progress Notes (Signed)
Location of Breast Cancer: Malignant neoplasm of female breast, unspecified estrogen receptor status, unspecified laterality, unspecified site of breast.  Did patient present with symptoms (if so, please note symptoms) or was this found on screening mammography?: Patient noticed some left breast swelling and tenderness.  Patient presented to [facility] ER for several weeks of cough, Left sided chest pain, left breast swelling.  PET 10/18/2018:  MRI Brain 10/18/2018:  Mammogram 10/19/2018:  Breast US 10/19/2018:  CT Chest 09/30/2018: dermal thickening and multiple masses within the left breast and a 6cm mass invading the left pectoralis muscle, consistent with primary breast malignancy, extensive thoracic adenopathy and numerous pulmonary metastases, and an indeterminate sub-centimeter lucency in the dome of the right liver.  Histology per Pathology Report:   Receptor Status: ER(), PR (), Her2-neu (), Ki-()  Past/Anticipated interventions by surgeon, if any:  Past/Anticipated interventions by pulmonology, if any: Dr. Lake Bells 10/12/2018 -After discussing the images with my partners we think the best approach is to obtain a core biopsy of the left breast mass. -If we are unable to obtain enough tissue for molecular/hormonal receptor testing then we are happy to perform a bronchoscopy to obtain tissue from the lung metastasis.   Past/Anticipated interventions by medical oncology, if any: Chemotherapy  Dr. Maylon Peppers 10/12/2018 -Given the suspected extensive pulmonary metastases and thoracic adenopathy, I have referred the patient to pulmonary medicine for urgent evaluation of biopsy to confirm malignancy and for hormone receptor studies, assuming that this is primary breast malignancy. -In addition, I have ordered PET scan and MRI brain to assess for evidence of other metastatic disease -Anticipating that she is going to require chemotherapy, I have also ordered TTE to assess baseline cardiac  function  -Pending the imaging studies and biopsy results, we will determine the treatment options and prognosis.  Lymphedema issues, if any: None in the arm.  Left subclavian swollen.  Pain issues, if any:  5-6 pain, shooting pains, feels like pins and needles.  BP 119/86 (BP Location: Right Arm, Patient Position: Sitting)   Pulse (!) 103   Temp 99.1 F (37.3 C) (Oral)   Resp (!) 28   Ht 5' 4"  (1.626 m)   Wt 267 lb 3.2 oz (121.2 kg)   SpO2 95%   BMI 45.86 kg/m    Wt Readings from Last 3 Encounters:  10/17/18 267 lb 3.2 oz (121.2 kg)  10/12/18 280 lb (127 kg)  10/12/18 268 lb 8 oz (121.8 kg)   SAFETY ISSUES:  Prior radiation? No  Pacemaker/ICD? No  Possible current pregnancy? No  Is the patient on methotrexate? No  Current Complaints / other details:   -Genetics 10/17/2018    Cori Razor, RN 10/16/2018,12:59 PM

## 2018-10-16 NOTE — Telephone Encounter (Signed)
Patient called regarding the biopsy she is supposed to have. She is confused and needs clarification.

## 2018-10-16 NOTE — Telephone Encounter (Signed)
Called the [facility] and there was nobody available to talk to. I left a voicemail for Anderson Malta to call back.

## 2018-10-17 ENCOUNTER — Inpatient Hospital Stay (HOSPITAL_COMMUNITY)
Admission: EM | Admit: 2018-10-17 | Discharge: 2018-10-20 | DRG: 579 | Disposition: A | Discharge status: Home / Self Care | Payer: BLUE CROSS/BLUE SHIELD | Source: Ambulatory Visit | Attending: Internal Medicine | Admitting: Internal Medicine

## 2018-10-17 ENCOUNTER — Ambulatory Visit
Admission: RE | Admit: 2018-10-17 | Discharge: 2018-10-17 | Disposition: A | Discharge status: Home / Self Care | Payer: BLUE CROSS/BLUE SHIELD | Source: Ambulatory Visit | Attending: Radiation Oncology | Admitting: Radiation Oncology

## 2018-10-17 ENCOUNTER — Inpatient Hospital Stay: Payer: BLUE CROSS/BLUE SHIELD

## 2018-10-17 ENCOUNTER — Other Ambulatory Visit: Payer: Self-pay

## 2018-10-17 ENCOUNTER — Inpatient Hospital Stay (HOSPITAL_BASED_OUTPATIENT_CLINIC_OR_DEPARTMENT_OTHER): Payer: BLUE CROSS/BLUE SHIELD | Admitting: Licensed Clinical Social Worker

## 2018-10-17 ENCOUNTER — Encounter (HOSPITAL_COMMUNITY): Payer: Self-pay

## 2018-10-17 ENCOUNTER — Other Ambulatory Visit: Payer: BLUE CROSS/BLUE SHIELD

## 2018-10-17 ENCOUNTER — Encounter: Payer: Self-pay | Admitting: *Deleted

## 2018-10-17 ENCOUNTER — Emergency Department (HOSPITAL_COMMUNITY): Payer: BLUE CROSS/BLUE SHIELD

## 2018-10-17 ENCOUNTER — Encounter: Payer: Self-pay | Admitting: Radiation Oncology

## 2018-10-17 VITALS — BP 119/86 | HR 103 | Temp 99.1°F | Resp 28 | Ht 64.0 in | Wt 267.2 lb

## 2018-10-17 DIAGNOSIS — J90 Pleural effusion, not elsewhere classified: Secondary | ICD-10-CM

## 2018-10-17 DIAGNOSIS — C7802 Secondary malignant neoplasm of left lung: Secondary | ICD-10-CM | POA: Diagnosis not present

## 2018-10-17 DIAGNOSIS — Z79899 Other long term (current) drug therapy: Secondary | ICD-10-CM | POA: Insufficient documentation

## 2018-10-17 DIAGNOSIS — Z8261 Family history of arthritis: Secondary | ICD-10-CM

## 2018-10-17 DIAGNOSIS — R0602 Shortness of breath: Secondary | ICD-10-CM | POA: Insufficient documentation

## 2018-10-17 DIAGNOSIS — Z803 Family history of malignant neoplasm of breast: Secondary | ICD-10-CM

## 2018-10-17 DIAGNOSIS — J910 Malignant pleural effusion: Secondary | ICD-10-CM | POA: Diagnosis not present

## 2018-10-17 DIAGNOSIS — M1990 Unspecified osteoarthritis, unspecified site: Secondary | ICD-10-CM | POA: Diagnosis not present

## 2018-10-17 DIAGNOSIS — R509 Fever, unspecified: Secondary | ICD-10-CM

## 2018-10-17 DIAGNOSIS — J9601 Acute respiratory failure with hypoxia: Secondary | ICD-10-CM | POA: Diagnosis not present

## 2018-10-17 DIAGNOSIS — C50812 Malignant neoplasm of overlapping sites of left female breast: Secondary | ICD-10-CM

## 2018-10-17 DIAGNOSIS — C50919 Malignant neoplasm of unspecified site of unspecified female breast: Secondary | ICD-10-CM

## 2018-10-17 DIAGNOSIS — F17210 Nicotine dependence, cigarettes, uncomplicated: Secondary | ICD-10-CM | POA: Insufficient documentation

## 2018-10-17 DIAGNOSIS — C7931 Secondary malignant neoplasm of brain: Secondary | ICD-10-CM | POA: Diagnosis present

## 2018-10-17 DIAGNOSIS — J9811 Atelectasis: Secondary | ICD-10-CM | POA: Diagnosis present

## 2018-10-17 DIAGNOSIS — C770 Secondary and unspecified malignant neoplasm of lymph nodes of head, face and neck: Secondary | ICD-10-CM | POA: Diagnosis present

## 2018-10-17 DIAGNOSIS — R918 Other nonspecific abnormal finding of lung field: Secondary | ICD-10-CM | POA: Diagnosis not present

## 2018-10-17 DIAGNOSIS — N6332 Unspecified lump in axillary tail of the left breast: Secondary | ICD-10-CM

## 2018-10-17 DIAGNOSIS — R0902 Hypoxemia: Secondary | ICD-10-CM

## 2018-10-17 DIAGNOSIS — C7801 Secondary malignant neoplasm of right lung: Secondary | ICD-10-CM | POA: Diagnosis present

## 2018-10-17 DIAGNOSIS — J181 Lobar pneumonia, unspecified organism: Secondary | ICD-10-CM | POA: Diagnosis not present

## 2018-10-17 DIAGNOSIS — R7989 Other specified abnormal findings of blood chemistry: Secondary | ICD-10-CM

## 2018-10-17 DIAGNOSIS — Z800 Family history of malignant neoplasm of digestive organs: Secondary | ICD-10-CM | POA: Diagnosis not present

## 2018-10-17 DIAGNOSIS — N6320 Unspecified lump in the left breast, unspecified quadrant: Secondary | ICD-10-CM

## 2018-10-17 DIAGNOSIS — C50912 Malignant neoplasm of unspecified site of left female breast: Secondary | ICD-10-CM | POA: Diagnosis not present

## 2018-10-17 DIAGNOSIS — Z801 Family history of malignant neoplasm of trachea, bronchus and lung: Secondary | ICD-10-CM

## 2018-10-17 DIAGNOSIS — Z5111 Encounter for antineoplastic chemotherapy: Secondary | ICD-10-CM | POA: Diagnosis not present

## 2018-10-17 DIAGNOSIS — M129 Arthropathy, unspecified: Secondary | ICD-10-CM

## 2018-10-17 DIAGNOSIS — R591 Generalized enlarged lymph nodes: Secondary | ICD-10-CM

## 2018-10-17 DIAGNOSIS — Z87891 Personal history of nicotine dependence: Secondary | ICD-10-CM | POA: Diagnosis not present

## 2018-10-17 DIAGNOSIS — C384 Malignant neoplasm of pleura: Secondary | ICD-10-CM | POA: Diagnosis not present

## 2018-10-17 DIAGNOSIS — Z888 Allergy status to other drugs, medicaments and biological substances status: Secondary | ICD-10-CM

## 2018-10-17 DIAGNOSIS — R590 Localized enlarged lymph nodes: Secondary | ICD-10-CM | POA: Diagnosis not present

## 2018-10-17 DIAGNOSIS — C7800 Secondary malignant neoplasm of unspecified lung: Secondary | ICD-10-CM | POA: Diagnosis not present

## 2018-10-17 DIAGNOSIS — C773 Secondary and unspecified malignant neoplasm of axilla and upper limb lymph nodes: Secondary | ICD-10-CM | POA: Diagnosis not present

## 2018-10-17 DIAGNOSIS — C50819 Malignant neoplasm of overlapping sites of unspecified female breast: Secondary | ICD-10-CM

## 2018-10-17 DIAGNOSIS — R778 Other specified abnormalities of plasma proteins: Secondary | ICD-10-CM

## 2018-10-17 DIAGNOSIS — R599 Enlarged lymph nodes, unspecified: Secondary | ICD-10-CM

## 2018-10-17 DIAGNOSIS — Z9889 Other specified postprocedural states: Secondary | ICD-10-CM

## 2018-10-17 LAB — COMPREHENSIVE METABOLIC PANEL
ALT: 29 U/L (ref 0–44)
AST: 42 U/L — ABNORMAL HIGH (ref 15–41)
Albumin: 3.8 g/dL (ref 3.5–5.0)
Alkaline Phosphatase: 70 U/L (ref 38–126)
Anion gap: 8 (ref 5–15)
BUN: 11 mg/dL (ref 6–20)
CO2: 25 mmol/L (ref 22–32)
Calcium: 9 mg/dL (ref 8.9–10.3)
Chloride: 106 mmol/L (ref 98–111)
Creatinine, Ser: 0.9 mg/dL (ref 0.44–1.00)
GFR calc non Af Amer: >60 mL/min (ref >60)
GLUCOSE: 89 mg/dL (ref 70–99)
Potassium: 4.2 mmol/L (ref 3.5–5.1)
Sodium: 139 mmol/L (ref 135–145)
Total Bilirubin: 0.8 mg/dL (ref 0.3–1.2)
Total Protein: 7.9 g/dL (ref 6.5–8.1)

## 2018-10-17 LAB — PROTIME-INR
INR: 0.9 (ref 0.8–1.2)
Prothrombin Time: 12.1 seconds (ref 11.4–15.2)

## 2018-10-17 LAB — CBC WITH DIFFERENTIAL/PLATELET
Abs Immature Granulocytes: 0.05 10*3/uL (ref 0.00–0.07)
Basophils Absolute: 0 10*3/uL (ref 0.0–0.1)
Basophils Relative: 0 %
Eosinophils Absolute: 0 10*3/uL (ref 0.0–0.5)
Eosinophils Relative: 0 %
HCT: 40.9 % (ref 36.0–46.0)
Hemoglobin: 12.6 g/dL (ref 12.0–15.0)
Immature Granulocytes: 1 %
Lymphocytes Relative: 22 %
Lymphs Abs: 1.8 10*3/uL (ref 0.7–4.0)
MCH: 28.4 pg (ref 26.0–34.0)
MCHC: 30.8 g/dL (ref 30.0–36.0)
MCV: 92.1 fL (ref 80.0–100.0)
MONOS PCT: 10 %
Monocytes Absolute: 0.8 10*3/uL (ref 0.1–1.0)
Neutro Abs: 5.6 10*3/uL (ref 1.7–7.7)
Neutrophils Relative %: 67 %
Platelets: 513 10*3/uL — ABNORMAL HIGH (ref 150–400)
RBC: 4.44 MIL/uL (ref 3.87–5.11)
RDW: 13.1 % (ref 11.5–15.5)
WBC: 8.4 10*3/uL (ref 4.0–10.5)
nRBC: 0 % (ref 0.0–0.2)

## 2018-10-17 LAB — TROPONIN I: Troponin I: 0.08 ng/mL (ref <0.03)

## 2018-10-17 LAB — BRAIN NATRIURETIC PEPTIDE: B Natriuretic Peptide: 39.8 pg/mL (ref 0.0–100.0)

## 2018-10-17 LAB — INFLUENZA PANEL BY PCR (TYPE A & B)
Influenza A By PCR: NEGATIVE
Influenza B By PCR: NEGATIVE

## 2018-10-17 MED ORDER — IOHEXOL 350 MG/ML SOLN
100.0000 mL | Freq: Once | INTRAVENOUS | Status: AC | PRN
  Administered 2018-10-17: 100 mL via INTRAVENOUS

## 2018-10-17 MED ORDER — SODIUM CHLORIDE (PF) 0.9 % IJ SOLN
INTRAMUSCULAR | Status: AC
  Filled 2018-10-17: qty 50

## 2018-10-17 MED ORDER — OXYCODONE HCL 5 MG PO TABS: 2.5000 mg | ORAL_TABLET | ORAL | 0 refills | Status: DC | PRN

## 2018-10-17 MED ORDER — IPRATROPIUM-ALBUTEROL 0.5-2.5 (3) MG/3ML IN SOLN
3.0000 mL | Freq: Once | RESPIRATORY_TRACT | Status: AC
  Administered 2018-10-17: 3 mL via RESPIRATORY_TRACT
  Filled 2018-10-17: qty 3

## 2018-10-17 MED ORDER — OXYCODONE-ACETAMINOPHEN 5-325 MG PO TABS
1.0000 | ORAL_TABLET | Freq: Once | ORAL | Status: AC
  Administered 2018-10-17: 1 via ORAL
  Filled 2018-10-17: qty 1

## 2018-10-17 NOTE — Telephone Encounter (Signed)
Called and spoke with Anderson Malta from the breast center. She stated that everything was taken care of for this patient after talking with [facility] yesterday. Will copy the note from that encounter below. Nothing further needed.    Jannette Spanner, CMA       4:51 PM  Note    Spoke with Anderson Malta, she is calling us back to explain the events that happened on Friday. They thought the patient had a a Mammogram at [facility] in February, they looked over the looked over the films and come to find out she didn't have a mammogram but she had a CT chest. Forestine Na had an opening on Tuesday morning that they were trying to hold for pt since this was a time-sensitive matter. Anderson Malta called and tried to explain what was going on and that she needed to speak to Dr. Lake Bells as soon as possible about the orders and the plan moving forward.   She told the person who answered our phone and took the message that this was important but kept telling her that they would send a message.   Anderson Malta states they have scheduled for diagnostic mammogram  biopsy, genetic counseling, Pet scan, first thing Thursday morning. The patient is aware. Dr. Maylon Peppers ended up signing off on the orders and just wanted to let you know that everything is handled. If you need to speak to [facility] or Dr. Maylon Peppers, the numbers are listed below.   [phone] Dr. Maylon Peppers [phone]

## 2018-10-17 NOTE — ED Triage Notes (Signed)
Pt from [place] center.  Pt hx breast cancer, pt has recently had difficulty breathing with her O2 sats ranging from 77-82%.  Rapid response came and placed pt on 2L, increasing her sats to  96%.  Trial without O2 sats were 84-86%.  Pt's last CT showed atelectasis, pts provider is concerned it is worse.

## 2018-10-17 NOTE — Progress Notes (Signed)
REFERRING PROVIDER: Tish Men, MD [place], [place] [address]  PRIMARY PROVIDER:  Baruch Gouty, FNP  PRIMARY REASON FOR VISIT:  1. Malignant neoplasm of female breast, unspecified estrogen receptor status, unspecified laterality, unspecified site of breast ([place])   2. Family history of breast cancer   3. Family history of colon cancer     HISTORY OF PRESENT ILLNESS:   Sharon Floyd, a 43 y.o. female, was seen for a [place] cancer genetics consultation at the request of Dr. Maylon Peppers due to a personal and family history of cancer..  Sharon Floyd presents to clinic today to discuss the possibility of a hereditary predisposition to cancer, genetic testing, and to further clarify her future cancer risks, as well as potential cancer risks for family members.   Sharon Floyd is a 43 y.o. 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 She presented to [facility] ER with a cough, left breast skin changes were noted. A chest CT showed multiple masses in the left breast and numerous pulmonary metastases.   CANCER HISTORY:    Breast cancer ([place])   09/30/2018 Imaging    CT chest with contrast (at [facility]: [facility]): Impressions: 1.  Dermal thickening of the multiple masses within the left breast as well as a invasive 6 cm mass of the left pectoralis major, compatible with primary breast cancer. 2.  Numerous pulmonary metastases.  Left axillary, retropectoral, supraclavicular, and upper mediastinal metastatic lymphadenopathy.  Mildly enlarged right axillary and supraclavicular lymph nodes may be metastatic. 3.  Indeterminant subcentimeter lucency in the dome of the right liver. 4.  Small left pleural effusion.    10/10/2018 Initial Diagnosis    Breast cancer ([place])      RISK FACTORS:  Menarche was at age 89.  First live birth at age 75.  OCP use for approximately 2 years.  Ovaries intact: yes.  Hysterectomy: no.  Menopausal status: perimenopausal.  HRT use: 0  years. Colonoscopy: no; not examined. Mammogram within the last year: no. Number of breast biopsies: 0. Up to date with pelvic exams: no. Any excessive radiation exposure in the past: no  Past Medical History:  Diagnosis Date  . Arthritis   . Cancer [facility])    breast  . Family history of breast cancer   . Family history of colon cancer     Past Surgical History:  Procedure Laterality Date  . PATELLA ARTHROPLASTY Left 1992    Social History   Socioeconomic History  . Marital status: Single    Spouse name: Not on file  . Number of children: Not on file  . Years of education: Not on file  . Highest education level: Not on file  Occupational History  . Not on file  Social Needs  . Financial resource strain: Not on file  . Food insecurity:    Worry: Not on file    Inability: Not on file  . Transportation needs:    Medical: Not on file    Non-medical: Not on file  Tobacco Use  . Smoking status: Former Smoker    Types: Cigarettes    Last attempt to quit: 10/09/2018    Years since quitting: 0.0  . Smokeless tobacco: Never Used  Substance and Sexual Activity  . Alcohol use: Not Currently  . Drug use: Never  . Sexual activity: Not on file  Lifestyle  . Physical activity:    Days per week: Not on file    Minutes per session: Not on  file  . Stress: Not on file  Relationships  . Social connections:    Talks on phone: Not on file    Gets together: Not on file    Attends religious service: Not on file    Active member of club or organization: Not on file    Attends meetings of clubs or organizations: Not on file    Relationship status: Not on file  Other Topics Concern  . Not on file  Social History Narrative  . Not on file     FAMILY HISTORY:  We obtained a detailed, 4-generation family history.  Significant diagnoses are listed below: Family History  Problem Relation Age of Onset  . Arthritis Mother   . Diabetes Father   . Hypertension Father   . Arthritis  Father   . Heart disease Father   . Early death Maternal Grandmother   . Asthma Maternal Grandfather   . Heart disease Maternal Grandfather   . Migraines Maternal Grandfather   . Early death Maternal Grandfather   . Asthma Paternal Grandmother   . Hypertension Paternal Grandmother   . Asthma Paternal Grandfather   . Hypertension Paternal Grandfather   . Breast cancer Other        paternal cousin, female  . Colon cancer Cousin        paternal  . Breast cancer Cousin        paternal  . Lung cancer Other    Sharon Floyd has one son, age 32. She does not have biological siblings.  Sharon Floyd mother is living at 55. Sharon Floyd had 3 maternal uncles, 1 maternal aunt. Her uncles have all passed away, no cancer history. No information is known about her aunt. No cancers in her maternal cousins that she is aware of. Her maternal grandmother died at 55 due to a brain hemorrhage, grandfather died at 12 due to heart issues.  Sharon Floyd father is living at 70. He does not have siblings. Sharon Floyd maternal grandmother died in her late 70s-70s. Sharon Floyd maternal grandfather died in his late 20s-70s. He had a brother who had lung cancer with history of smoking. He also had a sister whose daughter had colon cancer. This relative with colon cancer had a son, Sharon Floyd's second cousin, with breast cancer recently diagnosed in his 78s. Sharon Floyd has another second cousin once removed who had breast cancer.  Sharon Floyd is unaware of previous family history of genetic testing for hereditary cancer risks. Patient's maternal ancestors are of [place] descent, and paternal ancestors are of African American/Caucasian descent. There is no reported Ashkenazi Jewish ancestry. There is no known consanguinity.  GENETIC COUNSELING ASSESSMENT: Sharon Floyd is a 43 y.o. female with a personal and family history which is somewhat suggestive of Hereditary Breast and Ovarian Cancer and predisposition to  cancer. We, therefore, discussed and recommended the following at today's visit.   DISCUSSION: We discussed that 5-10% of breast cancer is hereditary, with most cases associated with the BRCA genes.  There are other genes that can be associated with hereditary breast cancer syndromes.  These include PALB, ATM, CHEK2, etc. There are also other genes associated with other cancers that we can analyze with genetic testing.  We reviewed the characteristics, features and inheritance patterns of hereditary cancer syndromes. We also discussed genetic testing, including the appropriate family members to test, the process of testing, insurance coverage and turn-around-time for results. We discussed the implications of a negative, positive and/or variant of  uncertain significant result. We recommended Sharon Floyd pursue genetic testing for the Common Hereditary Cancers gene panel.   The Common Hereditary Cancers Panel offered by [facility] includes sequencing and/or deletion duplication testing of the following 47 genes: APC, ATM, AXIN2, BARD1, BMPR1A, BRCA1, BRCA2, BRIP1, CDH1, CDKN2A (p14ARF), CDKN2A (p16INK4a), CKD4, CHEK2, CTNNA1, DICER1, EPCAM (Deletion/duplication testing only), GREM1 (promoter region deletion/duplication testing only), KIT, MEN1, MLH1, MSH2, MSH3, MSH6, MUTYH, NBN, NF1, NHTL1, PALB2, PDGFRA, PMS2, POLD1, POLE, PTEN, RAD50, RAD51C, RAD51D, SDHB, SDHC, SDHD, SMAD4, SMARCA4. STK11, TP53, TSC1, TSC2, and VHL.  The following genes were evaluated for sequence changes only: SDHA and HOXB13 c.251G>A variant only.  Based on Sharon Floyd. Rhody's personal history of cancer, she meets medical criteria for genetic testing. Despite that she meets criteria, she may still have an out of pocket cost. The lab will notify her of an OOP if any.  PLAN: After considering the risks, benefits, and limitations, Sharon Floyd provided informed consent to pursue genetic testing and the blood sample was sent to [facility] for  analysis of the Common Hereditary Cancers Panel. Results should be available within approximately 2-3 weeks' time, at which point they will be disclosed by telephone to Sharon Floyd, as will any additional recommendations warranted by these results. Sharon Floyd. Krinsky will receive a summary of her genetic counseling visit and a copy of her results once available. This information will also be available in Epic.   Based on Sharon Floyd. Misner's family history, we recommended her female second cousin, who was diagnosed with breast cancer, have genetic counseling and testing. Sharon Floyd. Merriott will let us know if we can be of any assistance in coordinating genetic counseling and/or testing for this family member.   Lastly, we encouraged Sharon Floyd. Laubacher to remain in contact with cancer genetics annually so that we can continuously update the family history and inform her of any changes in cancer genetics and testing that may be of benefit for this family.   Sharon Floyd. Alwine questions were answered to her satisfaction today. Our contact information was provided should additional questions or concerns arise. Thank you for the referral and allowing [place] to share in the care of your patient.   Faith Rogue, Sharon Floyd Genetic Counselor [email or web address] Phone: [phone]  The patient was seen for a total of 35 minutes in face-to-face genetic counseling. The patient was accompanied by her mother, Augustin Coupe.  Drs. Magrinat, Lindi Adie and/or Burr Medico were available for discussion regarding this case.

## 2018-10-17 NOTE — ED Notes (Signed)
Bed: FF69 Expected date:  Expected time:  Means of arrival:  Comments: [place]- 01-Mar-1976- stage 4 breast CA- [facility]- pulse ox 70s- 92-95 on 2L- diminished

## 2018-10-17 NOTE — ED Notes (Signed)
Pts mother request to be contacted when pt is admitted [phone]

## 2018-10-17 NOTE — ED Notes (Signed)
Bed: [facility] Expected date:  Expected time:  Means of arrival:  Comments: Pt is waiting for her husband

## 2018-10-17 NOTE — ED Notes (Signed)
[place] MD made aware of pt request for pain medication.

## 2018-10-17 NOTE — ED Provider Notes (Signed)
Emergency Department Provider Note   I have reviewed the triage vital signs and the nursing notes.   HISTORY  Chief Complaint No chief complaint on file.   HPI Sharon Floyd is a 43 y.o. 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  Patient states that she is had chemotherapy she is wait for radiation but has not yet.  She not had any fever or other infection symptoms.  No chills or body aches.  Patient states that she has worsening dyspnea on exertion.  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 No lower extremity swelling but does have significant swelling of her left breast with a cancer started.  No upper extremity swelling.  No history of DVT.  No other associated or modifying symptoms.    Past Medical History:  Diagnosis Date  . Arthritis   . Cancer [facility])    breast  . Family history of breast cancer   . Family history of colon cancer     Patient Active Problem List   Diagnosis Date Noted  . Family history of breast cancer   . Family history of colon cancer   . Family history of cancer 10/12/2018  . Malignant neoplasm of overlapping sites of breast ([place]) 10/10/2018    Past Surgical History:  Procedure Laterality Date  . PATELLA REALIGNMENT Left 1992    Current Outpatient Rx  . Order #: [id number] Class: Historical Med  . Order #: [id number] Class: Normal  . Order #: [id number] Class: Normal    Allergies Aspirin  Family History  Problem Relation Age of Onset  . Arthritis Mother   . Diabetes Father   . Hypertension Father   . Arthritis Father   . Heart disease Father   . Early death Maternal Grandmother   . Asthma Maternal Grandfather   . Heart disease Maternal Grandfather   . Migraines Maternal Grandfather   . Early death Maternal Grandfather   .  Asthma Paternal Grandmother   . Hypertension Paternal Grandmother   . Asthma Paternal Grandfather   . Hypertension Paternal Grandfather   . Breast cancer Other        paternal cousin, female  . Colon cancer Cousin        paternal  . Breast cancer Cousin        paternal  . Lung cancer Other     Social History Social History   Tobacco Use  . Smoking status: Former Smoker    Types: Cigarettes    Last attempt to quit: 10/09/2018    Years since quitting: 0.0  . Smokeless tobacco: Never Used  Substance Use Topics  . Alcohol use: Not Currently  . Drug use: Never    Review of Systems  All other systems negative except as documented in the HPI. All pertinent positives and negatives as reviewed in the HPI. ____________________________________________   PHYSICAL EXAM:  VITAL SIGNS: ED Triage Vitals  Enc Vitals Group     BP 10/17/18 1549 (!) 144/93     Pulse Rate 10/17/18 1549 97     Resp 10/17/18 1549 16     Temp 10/17/18 1549 98.2 F (36.8 C)     Temp src --      SpO2 10/17/18 1549 99 %     Weight 10/17/18 1551 267 lb (  121.1 kg)     Height 10/17/18 1551 5\' 4"  (1.626 m)    Constitutional: Alert and oriented. Well appearing and in no acute distress. Eyes: Conjunctivae are normal. PERRL. EOMI. Head: Atraumatic. Nose: No congestion/rhinnorhea. Mouth/Throat: Mucous membranes are moist.  Oropharynx non-erythematous. Neck: No stridor.  No meningeal signs.   Cardiovascular: Normal rate, regular rhythm. Good peripheral circulation. Grossly normal heart sounds.   Respiratory: Normal respiratory effort.  No retractions. Lungs CTAB. Gastrointestinal: Soft and nontender. No distention.  Musculoskeletal: No lower extremity tenderness nor edema. No gross deformities of extremities. Neurologic:  Normal speech and language. No gross focal neurologic deficits are appreciated.  Skin:  Skin is warm, dry and intact. No rash noted.   ____________________________________________    LABS (all labs ordered are listed, but only abnormal results are displayed)  Labs Reviewed  CBC WITH DIFFERENTIAL/PLATELET - Abnormal; Notable for the following components:      Result Value   Platelets 513 (*)    All other components within normal limits  COMPREHENSIVE METABOLIC PANEL - Abnormal; Notable for the following components:   AST 42 (*)    All other components within normal limits  TROPONIN I - Abnormal; Notable for the following components:   Troponin I 0.08 (*)    All other components within normal limits  BRAIN NATRIURETIC PEPTIDE  PROTIME-INR   ____________________________________________  EKG   EKG Interpretation  Date/Time:    Ventricular Rate:    PR Interval:    QRS Duration:   QT Interval:    QTC Calculation:   R Axis:     Text Interpretation:         ____________________________________________  RADIOLOGY  Dg Chest 2 View  Result Date: 10/17/2018 CLINICAL DATA:  43 year old female with breast cancer and shortness of breath requiring rapid response. EXAM: CHEST - 2 VIEW COMPARISON:  [facility] chest CT 09/30/2018. FINDINGS: Numerous rounded bilateral pulmonary metastases redemonstrated with superimposed small left pleural effusion. Mildly decreased left lung volume and decreased left lung ventilation compared to the February CT. No superimposed pneumothorax or pulmonary edema. Stable cardiac size and mediastinal contours. Visualized tracheal air column is within normal limits. Negative visible bowel gas pattern. Asymmetric left breast enlargement redemonstrated. No osseous lesion is evident. IMPRESSION: Extensive pulmonary metastatic disease with small left pleural effusion and worsening left lung base ventilation since the February CT. Electronically Signed   By: Genevie Ann M.D.   On: 10/17/2018 17:51   Ct Angio Chest Pe W And/or Wo Contrast  Result Date: 10/17/2018 CLINICAL DATA:  Cough and shortness of breath. Recently diagnosed breast  cancer. EXAM: CT ANGIOGRAPHY CHEST WITH CONTRAST TECHNIQUE: Multidetector CT imaging of the chest was performed using the standard protocol during bolus administration of intravenous contrast. Multiplanar CT image reconstructions and MIPs were obtained to evaluate the vascular anatomy. CONTRAST:  144mL OMNIPAQUE IOHEXOL 350 MG/ML SOLN COMPARISON:  09/30/2018 FINDINGS: Cardiovascular: No lobar or more central pulmonary embolus is identified. Segmental assessment is limited by motion artifact and patient body habitus. The heart is normal in size. There is no pericardial effusion. The thoracic aorta is normal in size. Mediastinum/Nodes: Again partially visualized are multiple left breast masses with left greater than right breast skin thickening. A 6 x 6 cm mass invading the left pectoralis major muscle is unchanged, and there is a similar appearance of diffuse regional edema. Multiple enlarged left axillary and subpectoral lymph nodes are again partially visualized measuring up to approximately 4 cm in long axis. Partially  visualized left supraclavicular lymph nodes are similar to the prior CT. Partially visualized right axillary lymph nodes appear mildly larger than on the prior CT and measure up to 1.5 cm in short axis. Enlarged mediastinal lymph nodes have not significantly changed with the largest measuring 1.9 cm in short axis in the pre-vascular region. Lungs/Pleura: A moderate-sized left pleural effusion has enlarged from the prior CT with increasing atelectasis in the left lower lobe and to a lesser extent lingula. There is the suggestion of mild underlying pleural thickening, however a discrete pleural mass is not identified. Numerous bilateral lung nodules are again seen with multiple demonstrating a mild interval increase in size including a 3.3 x 3.1 cm left upper lobe mass (series 6, image 48, previously 3.1 x 3.0 cm) and a 2.0 cm right upper lobe nodule (series 6, image 50, previously 1.7 cm). Motion  artifact and the enlarging left pleural effusion and atelectasis obscure some nodules. Upper Abdomen: Subcentimeter hypodensity in the hepatic dome on the prior CT is obscured by motion artifact on today's study. Musculoskeletal: No acute osseous abnormality or suspicious osseous lesion. Review of the MIP images confirms the above findings. IMPRESSION: 1. No evidence of lobar or more central pulmonary embolus. Nondiagnostic segmental assessment. 2. Moderate left pleural effusion, increased in size from the prior CT with increasing left lung atelectasis. 3. Unchanged appearance of multiple left breast masses including an invasive mass involving the pectoralis major. 4. Unchanged left axillary, left subpectoral, and mediastinal lymphadenopathy. 5. Mild interval enlargement of multiple lung metastases and of right axillary lymph nodes. Electronically Signed   By: Logan Bores M.D.   On: 10/17/2018 20:05    ____________________________________________   PROCEDURES  Procedure(s) performed:   Procedures  CRITICAL CARE Performed by: Merrily Pew Total critical care time: 35 minutes Critical care time was exclusive of separately billable procedures and treating other patients. Critical care was necessary to treat or prevent imminent or life-threatening deterioration. Critical care was time spent personally by me on the following activities: development of treatment plan with patient and/or surrogate as well as nursing, discussions with consultants, evaluation of patient's response to treatment, examination of patient, obtaining history from patient or surrogate, ordering and performing treatments and interventions, ordering and review of laboratory studies, ordering and review of radiographic studies, pulse oximetry and re-evaluation of patient's condition.  ____________________________________________   INITIAL IMPRESSION / ASSESSMENT AND PLAN / ED COURSE  Ultimately found to have a large left-sided  pleural effusion is likely cause for her hypoxic respiratory failure.  This association with her metastatic disease is probably what is going on.  She is doing well and stable on oxygen so will not do thoracentesis at this point.  Will admit for same.  She also is worsening metastatic disease which is probably making things worse as well.  No evidence of PE down to the segmental level. No e/o infection.     Pertinent labs & imaging results that were available during my care of the patient were reviewed by me and considered in my medical decision making (see chart for details).  ____________________________________________  FINAL CLINICAL IMPRESSION(S) / ED DIAGNOSES  Final diagnoses:  Acute respiratory failure with hypoxia (HCC)  Pleural effusion     MEDICATIONS GIVEN DURING THIS VISIT:  Medications  sodium chloride (PF) 0.9 % injection (has no administration in time range)  ipratropium-albuterol (DUONEB) 0.5-2.5 (3) MG/3ML nebulizer solution 3 mL (3 mLs Nebulization Given 10/17/18 1619)  iohexol (OMNIPAQUE) 350 MG/ML injection 100 mL (  100 mLs Intravenous Contrast Given 10/17/18 1929)     NEW OUTPATIENT MEDICATIONS STARTED DURING THIS VISIT:  New Prescriptions   No medications on file    Note:  This note was prepared with assistance of Dragon voice recognition software. Occasional wrong-word or sound-a-like substitutions may have occurred due to the inherent limitations of voice recognition software.   Merrily Pew, MD 10/17/18 2051

## 2018-10-17 NOTE — Addendum Note (Signed)
Encounter addended by: Kyung Rudd, MD on: 10/17/2018 5:15 PM  Actions taken: Problem List modified, Visit diagnoses modified

## 2018-10-18 ENCOUNTER — Inpatient Hospital Stay (HOSPITAL_COMMUNITY): Payer: BLUE CROSS/BLUE SHIELD

## 2018-10-18 ENCOUNTER — Observation Stay (HOSPITAL_COMMUNITY): Payer: BLUE CROSS/BLUE SHIELD

## 2018-10-18 ENCOUNTER — Other Ambulatory Visit: Payer: Self-pay

## 2018-10-18 ENCOUNTER — Ambulatory Visit (HOSPITAL_COMMUNITY): Payer: BLUE CROSS/BLUE SHIELD

## 2018-10-18 ENCOUNTER — Ambulatory Visit
Admission: RE | Admit: 2018-10-18 | Discharge: 2018-10-18 | Disposition: A | Discharge status: Home / Self Care | Payer: BLUE CROSS/BLUE SHIELD | Source: Ambulatory Visit | Attending: Radiation Oncology | Admitting: Radiation Oncology

## 2018-10-18 ENCOUNTER — Ambulatory Visit (HOSPITAL_COMMUNITY): Admission: RE | Admit: 2018-10-18 | Payer: BLUE CROSS/BLUE SHIELD | Source: Ambulatory Visit

## 2018-10-18 ENCOUNTER — Ambulatory Visit (HOSPITAL_COMMUNITY)
Admit: 2018-10-18 | Discharge: 2018-10-18 | Disposition: A | Discharge status: Home / Self Care | Payer: BLUE CROSS/BLUE SHIELD | Attending: Hematology | Admitting: Hematology

## 2018-10-18 DIAGNOSIS — C7802 Secondary malignant neoplasm of left lung: Secondary | ICD-10-CM | POA: Diagnosis present

## 2018-10-18 DIAGNOSIS — Z87891 Personal history of nicotine dependence: Secondary | ICD-10-CM | POA: Diagnosis not present

## 2018-10-18 DIAGNOSIS — J910 Malignant pleural effusion: Secondary | ICD-10-CM

## 2018-10-18 DIAGNOSIS — C7931 Secondary malignant neoplasm of brain: Secondary | ICD-10-CM | POA: Insufficient documentation

## 2018-10-18 DIAGNOSIS — C50912 Malignant neoplasm of unspecified site of left female breast: Secondary | ICD-10-CM

## 2018-10-18 DIAGNOSIS — Z510 Encounter for antineoplastic radiation therapy: Secondary | ICD-10-CM | POA: Insufficient documentation

## 2018-10-18 DIAGNOSIS — J181 Lobar pneumonia, unspecified organism: Secondary | ICD-10-CM | POA: Diagnosis not present

## 2018-10-18 DIAGNOSIS — C770 Secondary and unspecified malignant neoplasm of lymph nodes of head, face and neck: Secondary | ICD-10-CM | POA: Diagnosis not present

## 2018-10-18 DIAGNOSIS — J9601 Acute respiratory failure with hypoxia: Secondary | ICD-10-CM | POA: Diagnosis not present

## 2018-10-18 DIAGNOSIS — C7801 Secondary malignant neoplasm of right lung: Secondary | ICD-10-CM | POA: Diagnosis present

## 2018-10-18 DIAGNOSIS — C50812 Malignant neoplasm of overlapping sites of left female breast: Secondary | ICD-10-CM | POA: Diagnosis not present

## 2018-10-18 DIAGNOSIS — Z5111 Encounter for antineoplastic chemotherapy: Secondary | ICD-10-CM | POA: Diagnosis not present

## 2018-10-18 DIAGNOSIS — R778 Other specified abnormalities of plasma proteins: Secondary | ICD-10-CM

## 2018-10-18 DIAGNOSIS — J90 Pleural effusion, not elsewhere classified: Secondary | ICD-10-CM

## 2018-10-18 DIAGNOSIS — C384 Malignant neoplasm of pleura: Secondary | ICD-10-CM | POA: Diagnosis not present

## 2018-10-18 DIAGNOSIS — Z803 Family history of malignant neoplasm of breast: Secondary | ICD-10-CM | POA: Diagnosis not present

## 2018-10-18 DIAGNOSIS — N6320 Unspecified lump in the left breast, unspecified quadrant: Secondary | ICD-10-CM

## 2018-10-18 DIAGNOSIS — Z800 Family history of malignant neoplasm of digestive organs: Secondary | ICD-10-CM | POA: Diagnosis not present

## 2018-10-18 DIAGNOSIS — R7989 Other specified abnormal findings of blood chemistry: Secondary | ICD-10-CM

## 2018-10-18 DIAGNOSIS — Z8261 Family history of arthritis: Secondary | ICD-10-CM | POA: Diagnosis not present

## 2018-10-18 DIAGNOSIS — Z801 Family history of malignant neoplasm of trachea, bronchus and lung: Secondary | ICD-10-CM | POA: Diagnosis not present

## 2018-10-18 DIAGNOSIS — M1990 Unspecified osteoarthritis, unspecified site: Secondary | ICD-10-CM | POA: Diagnosis present

## 2018-10-18 DIAGNOSIS — Z888 Allergy status to other drugs, medicaments and biological substances status: Secondary | ICD-10-CM | POA: Diagnosis not present

## 2018-10-18 DIAGNOSIS — Z171 Estrogen receptor negative status [ER-]: Secondary | ICD-10-CM

## 2018-10-18 DIAGNOSIS — J9811 Atelectasis: Secondary | ICD-10-CM | POA: Diagnosis present

## 2018-10-18 DIAGNOSIS — R918 Other nonspecific abnormal finding of lung field: Secondary | ICD-10-CM | POA: Diagnosis not present

## 2018-10-18 DIAGNOSIS — C50919 Malignant neoplasm of unspecified site of unspecified female breast: Secondary | ICD-10-CM | POA: Diagnosis not present

## 2018-10-18 LAB — HIV ANTIBODY (ROUTINE TESTING W REFLEX): HIV Screen 4th Generation wRfx: NONREACTIVE

## 2018-10-18 LAB — LACTATE DEHYDROGENASE, PLEURAL OR PERITONEAL FLUID: LD FL: 276 U/L — AB (ref 3–23)

## 2018-10-18 LAB — BODY FLUID CELL COUNT WITH DIFFERENTIAL
EOS FL: 0 %
Lymphs, Fluid: 52 %
Monocyte-Macrophage-Serous Fluid: 48 % — ABNORMAL LOW (ref 50–90)
Neutrophil Count, Fluid: 0 % (ref 0–25)
Total Nucleated Cell Count, Fluid: 1432 cu mm — ABNORMAL HIGH (ref 0–1000)

## 2018-10-18 LAB — TROPONIN I
TROPONIN I: 0.03 ng/mL — AB (ref <0.03)
Troponin I: 0.04 ng/mL (ref <0.03)
Troponin I: 0.03 ng/mL (ref <0.03)

## 2018-10-18 LAB — GRAM STAIN

## 2018-10-18 LAB — PROTEIN, PLEURAL OR PERITONEAL FLUID: Total protein, fluid: 4.6 g/dL

## 2018-10-18 LAB — LACTATE DEHYDROGENASE: LDH: 314 U/L — ABNORMAL HIGH (ref 98–192)

## 2018-10-18 LAB — GLUCOSE, PLEURAL OR PERITONEAL FLUID: Glucose, Fluid: 89 mg/dL

## 2018-10-18 MED ORDER — GADOBUTROL 1 MMOL/ML IV SOLN
10.0000 mL | Freq: Once | INTRAVENOUS | Status: AC | PRN
  Administered 2018-10-18: 10 mL via INTRAVENOUS

## 2018-10-18 MED ORDER — LIDOCAINE HCL 1 % IJ SOLN
INTRAMUSCULAR | Status: AC
  Filled 2018-10-18: qty 20

## 2018-10-18 MED ORDER — ACETAMINOPHEN 325 MG PO TABS
650.0000 mg | ORAL_TABLET | Freq: Four times a day (QID) | ORAL | Status: DC | PRN
  Administered 2018-10-18: 650 mg via ORAL
  Filled 2018-10-18: qty 2

## 2018-10-18 MED ORDER — MORPHINE SULFATE (PF) 2 MG/ML IV SOLN
1.0000 mg | INTRAVENOUS | Status: DC | PRN
  Administered 2018-10-18 – 2018-10-19 (×2): 2 mg via INTRAVENOUS
  Filled 2018-10-18 (×2): qty 1

## 2018-10-18 MED ORDER — DEXTROSE 5 % IV SOLN
3.0000 g | INTRAVENOUS | Status: AC
  Administered 2018-10-19: 3 g via INTRAVENOUS
  Filled 2018-10-18: qty 3000
  Filled 2018-10-18: qty 3

## 2018-10-18 MED ORDER — DEXTROSE 5 % IV SOLN
3.0000 g | INTRAVENOUS | Status: DC
  Filled 2018-10-18: qty 3000

## 2018-10-18 MED ORDER — ACETAMINOPHEN 650 MG RE SUPP: 650.0000 mg | Freq: Four times a day (QID) | RECTAL | Status: DC | PRN

## 2018-10-18 MED ORDER — HYDROCODONE-ACETAMINOPHEN 5-325 MG PO TABS
1.0000 | ORAL_TABLET | Freq: Four times a day (QID) | ORAL | Status: DC | PRN
  Administered 2018-10-18 – 2018-10-19 (×4): 2 via ORAL
  Filled 2018-10-18 (×4): qty 2

## 2018-10-18 MED ORDER — HEPARIN SODIUM (PORCINE) 5000 UNIT/ML IJ SOLN: 5000.0000 [IU] | Freq: Three times a day (TID) | INTRAMUSCULAR | Status: DC

## 2018-10-18 NOTE — H&P (Signed)
History and Physical    Sharon Floyd GLO:[id number] DOB: [birth year] DOA: 10/17/2018  PCP: Baruch Gouty, FNP  Chief Complaint: Shortness of breath  HPI: Sharon Floyd is a 43 y.o. female with medical history significant of recent diagnosis of suspected stage IV breast cancer with lung mets presenting to the hospital for evaluation of shortness of breath.  Patient presented from the cancer center where oxygen saturation was 77 to 82% on room air and she was placed on 2 L supplemental oxygen.  Patient reports having dyspnea on exertion for the past few weeks; worse this past 1 week.  Also reports cough productive of white-colored sputum.  No fevers or chills.  Reports significant left-sided breast pain for the past 3 to 4 weeks due to swelling.  Her oncologist is Dr. Jeannie Done in [facility].  Patient states she has not been started on any treatments yet.  Review of Systems: As per HPI otherwise 10 point review of systems negative.  Past Medical History:  Diagnosis Date  . Arthritis   . Cancer [facility])    breast  . Family history of breast cancer   . Family history of colon cancer     Past Surgical History:  Procedure Laterality Date  . PATELLA REALIGNMENT Left 1992     reports that she quit smoking 9 days ago. Her smoking use included cigarettes. She has never used smokeless tobacco. She reports previous alcohol use. She reports that she does not use drugs.  Allergies  Allergen Reactions  . Aspirin     Family History  Problem Relation Age of Onset  . Arthritis Mother   . Diabetes Father   . Hypertension Father   . Arthritis Father   . Heart disease Father   . Early death Maternal Grandmother   . Asthma Maternal Grandfather   . Heart disease Maternal Grandfather   . Migraines Maternal Grandfather   . Early death Maternal Grandfather   . Asthma Paternal Grandmother   . Hypertension Paternal Grandmother   . Asthma Paternal Grandfather   . Hypertension Paternal Grandfather   .  Breast cancer Other        paternal cousin, female  . Colon cancer Cousin        paternal  . Breast cancer Cousin        paternal  . Lung cancer Other     Prior to Admission medications   Medication Sig Start Date End Date Taking? Authorizing Provider  naproxen sodium (ALEVE) 220 MG tablet Take 220 mg by mouth daily as needed (pain).    Yes [provider]  LORazepam (ATIVAN) 0.5 MG tablet Take 0.5 tablets (0.25 mg total) by mouth once as needed for up to 2 doses for anxiety (Prior to PET and MRI scan). 10/12/18   Tish Men, MD  oxyCODONE (OXY IR/ROXICODONE) 5 MG immediate release tablet Take 0.5-1 tablets (2.5-5 mg total) by mouth every 4 (four) hours as needed for severe pain. 10/17/18   Hayden Pedro, PA-C    Physical Exam: Vitals:   10/18/18 0200 10/18/18 0201 10/18/18 0250 10/18/18 0603  BP: 119/78  (!) 168/90 112/87  Pulse: 85  94 74  Resp:   20 19  Temp:  98.2 F (36.8 C) 98.2 F (36.8 C) 98.2 F (36.8 C)  TempSrc:  Oral Oral Oral  SpO2: 96%  97% 98%  Weight:      Height:        Physical Exam  Constitutional: She is oriented to  person, place, and time. She appears well-developed and well-nourished. No distress.  HENT:  Head: Normocephalic.  Mouth/Throat: Oropharynx is clear and moist.  Eyes: Right eye exhibits no discharge. Left eye exhibits no discharge.  Neck: Neck supple.  Cardiovascular: Normal rate, regular rhythm and intact distal pulses.  Pulmonary/Chest: Effort normal. No respiratory distress. She has no wheezes. She has no rales.  Decreased breath sounds at the left lung base On 2.5 L supplemental oxygen Speaking clearly in full sentences  Abdominal: Soft. Bowel sounds are normal. She exhibits no distension. There is no abdominal tenderness. There is no guarding.  Musculoskeletal:        General: Edema present.     Comments: Left breast: Significant edema and dermal thickening.  Neurological: She is alert and oriented to person, place, and  time.  Skin: Skin is warm and dry. She is not diaphoretic.    Labs on Admission: I have personally reviewed following labs and imaging studies  CBC: Recent Labs  Lab 10/12/18 1030 10/17/18 1612  WBC 7.4 8.4  NEUTROABS 5.1 5.6  HGB 13.0 12.6  HCT 40.3 40.9  MCV 90.4 92.1  PLT 504* 706*   Basic Metabolic Panel: Recent Labs  Lab 10/12/18 1030 10/17/18 1612  NA 139 139  K 4.2 4.2  CL 103 106  CO2 27 25  GLUCOSE 111* 89  BUN 11 11  CREATININE 0.99 0.90  CALCIUM 9.8 9.0   GFR: Estimated Creatinine Clearance: 103.4 mL/min (by C-G formula based on SCr of 0.9 mg/dL). Liver Function Tests: Recent Labs  Lab 10/12/18 1030 10/17/18 1612  AST 20 42*  ALT 19 29  ALKPHOS 65 70  BILITOT 0.5 0.8  PROT 7.7 7.9  ALBUMIN 4.1 3.8   No results for input(s): LIPASE, AMYLASE in the last 168 hours. No results for input(s): AMMONIA in the last 168 hours. Coagulation Profile: Recent Labs  Lab 10/17/18 1612  INR 0.9   Cardiac Enzymes: Recent Labs  Lab 10/17/18 1612 10/18/18 0443  TROPONINI 0.08* 0.04*   BNP (last 3 results) No results for input(s): PROBNP in the last 8760 hours. HbA1C: No results for input(s): HGBA1C in the last 72 hours. CBG: No results for input(s): GLUCAP in the last 168 hours. Lipid Profile: No results for input(s): CHOL, HDL, LDLCALC, TRIG, CHOLHDL, LDLDIRECT in the last 72 hours. Thyroid Function Tests: No results for input(s): TSH, T4TOTAL, FREET4, T3FREE, THYROIDAB in the last 72 hours. Anemia Panel: No results for input(s): VITAMINB12, FOLATE, FERRITIN, TIBC, IRON, RETICCTPCT in the last 72 hours. Urine analysis: No results found for: COLORURINE, APPEARANCEUR, LABSPEC, PHURINE, GLUCOSEU, HGBUR, BILIRUBINUR, KETONESUR, PROTEINUR, UROBILINOGEN, NITRITE, LEUKOCYTESUR  Radiological Exams on Admission: Dg Chest 2 View  Result Date: 10/17/2018 CLINICAL DATA:  43 year old female with breast cancer and shortness of breath requiring rapid response.  EXAM: CHEST - 2 VIEW COMPARISON:  [facility] chest CT 09/30/2018. FINDINGS: Numerous rounded bilateral pulmonary metastases redemonstrated with superimposed small left pleural effusion. Mildly decreased left lung volume and decreased left lung ventilation compared to the February CT. No superimposed pneumothorax or pulmonary edema. Stable cardiac size and mediastinal contours. Visualized tracheal air column is within normal limits. Negative visible bowel gas pattern. Asymmetric left breast enlargement redemonstrated. No osseous lesion is evident. IMPRESSION: Extensive pulmonary metastatic disease with small left pleural effusion and worsening left lung base ventilation since the February CT. Electronically Signed   By: Genevie Ann M.D.   On: 10/17/2018 17:51   Ct Angio Chest Pe W  And/or Wo Contrast  Result Date: 10/17/2018 CLINICAL DATA:  Cough and shortness of breath. Recently diagnosed breast cancer. EXAM: CT ANGIOGRAPHY CHEST WITH CONTRAST TECHNIQUE: Multidetector CT imaging of the chest was performed using the standard protocol during bolus administration of intravenous contrast. Multiplanar CT image reconstructions and MIPs were obtained to evaluate the vascular anatomy. CONTRAST:  131mL OMNIPAQUE IOHEXOL 350 MG/ML SOLN COMPARISON:  09/30/2018 FINDINGS: Cardiovascular: No lobar or more central pulmonary embolus is identified. Segmental assessment is limited by motion artifact and patient body habitus. The heart is normal in size. There is no pericardial effusion. The thoracic aorta is normal in size. Mediastinum/Nodes: Again partially visualized are multiple left breast masses with left greater than right breast skin thickening. A 6 x 6 cm mass invading the left pectoralis major muscle is unchanged, and there is a similar appearance of diffuse regional edema. Multiple enlarged left axillary and subpectoral lymph nodes are again partially visualized measuring up to approximately 4 cm in long axis.  Partially visualized left supraclavicular lymph nodes are similar to the prior CT. Partially visualized right axillary lymph nodes appear mildly larger than on the prior CT and measure up to 1.5 cm in short axis. Enlarged mediastinal lymph nodes have not significantly changed with the largest measuring 1.9 cm in short axis in the pre-vascular region. Lungs/Pleura: A moderate-sized left pleural effusion has enlarged from the prior CT with increasing atelectasis in the left lower lobe and to a lesser extent lingula. There is the suggestion of mild underlying pleural thickening, however a discrete pleural mass is not identified. Numerous bilateral lung nodules are again seen with multiple demonstrating a mild interval increase in size including a 3.3 x 3.1 cm left upper lobe mass (series 6, image 48, previously 3.1 x 3.0 cm) and a 2.0 cm right upper lobe nodule (series 6, image 50, previously 1.7 cm). Motion artifact and the enlarging left pleural effusion and atelectasis obscure some nodules. Upper Abdomen: Subcentimeter hypodensity in the hepatic dome on the prior CT is obscured by motion artifact on today's study. Musculoskeletal: No acute osseous abnormality or suspicious osseous lesion. Review of the MIP images confirms the above findings. IMPRESSION: 1. No evidence of lobar or more central pulmonary embolus. Nondiagnostic segmental assessment. 2. Moderate left pleural effusion, increased in size from the prior CT with increasing left lung atelectasis. 3. Unchanged appearance of multiple left breast masses including an invasive mass involving the pectoralis major. 4. Unchanged left axillary, left subpectoral, and mediastinal lymphadenopathy. 5. Mild interval enlargement of multiple lung metastases and of right axillary lymph nodes. Electronically Signed   By: Logan Bores M.D.   On: 10/17/2018 20:05    Assessment/Plan Principal Problem:   Pleural effusion Active Problems:   Malignant neoplasm of overlapping  sites of breast (HCC)   Acute respiratory failure with hypoxia (HCC)   Elevated troponin   Acute hypoxic respiratory failure secondary to pleural effusion -SPO2 77 to 82% on room air.  Requiring 2 to 3 L supplemental oxygen. -Afebrile and no leukocytosis.  Influenza panel negative.  BNP normal.  CT angiogram without evidence of lobar or more central PE.  Showing moderate left pleural effusion, increased in size from prior CT with increasing left lung atelectasis.  Pleural effusion likely related to suspected metastatic breast cancer. -Supplemental oxygen -IR ultrasound-guided thoracentesis with pleural fluid analysis labs ordered -Check serum LDH level  Elevated troponin Troponin 0.08 > 0.04.  Difficult to assess whether patient is having angina due to significant left breast  edema/discomfort.  Except family history and prior tobacco use, no other significant risk factors for CAD. -Continue to trend troponin -EKG -Cardiac monitoring  Suspected stage IV breast cancer with lung mets -Patient has significant left breast edema and discomfort.  Recently seen by oncology and radiation oncology.  Scheduled for mammogram and breast biopsy tomorrow.  Patient will need continued oncology follow-up. -CT showing unchanged appearance of multiple left breast masses including an invasive mass involving the pectoralis major.  Unchanged left axillary, left subpectoral, and mediastinal lymphadenopathy.  Mild interval enlargement of multiple lung metastasis and of right axillary lymph nodes. -Percocet PRN pain, Tylenol PRN  DVT prophylaxis: SCDs Code Status: Full code Family Communication: No family available. Disposition Plan: Anticipate discharge after clinical improvement. Admission status: Observation, telemetry   Shela Leff MD [facility] Pager [phone]  If 7PM-7AM, please contact night-coverage [email or web address] Password [facility]  10/18/2018, 7:42 AM

## 2018-10-18 NOTE — Progress Notes (Signed)
Attempted patient for stat Brain MRI, pt does not clear bore of scanner due to body habitus. Spoke to [employer] and explained that if MD still wants patient to have MRI done as INPT she will need to call [facility] at [phone] so they can give a time to have [facility] bring patient. Sharon Floyd is to call md. Called [facility], no answer at [phone] 11:37am

## 2018-10-18 NOTE — Progress Notes (Signed)
Patient ID: Sharon Floyd, female   DOB: 1976-08-07, 43 y.o.   MRN: [id number] IR dept called to get pt for port a cath/lymph node biopsy. She is currently at [facility] undergoing MRI. Per Dr. Anselm Pancoast procedures will now be done on 3/12. Nurse aware.

## 2018-10-18 NOTE — ED Notes (Addendum)
ED TO INPATIENT HANDOFF REPORT  ED Nurse Name and Phone #[phone] S Name/Age/Gender Sharon Floyd 43 y.o. female Room/Bed: WA13/WA13  Code Status   Code Status: Full Code  Home/SNF/Other Home Patient oriented to: self Is this baseline? Yes   Triage Complete: Triage complete  Chief Complaint CA Pt; [place]  Triage Note Pt from [facility].  Pt hx breast cancer, pt has recently had difficulty breathing with her O2 sats ranging from 77-82%.  Rapid response came and placed pt on 2L, increasing her sats to  96%.  Trial without O2 sats were 84-86%.  Pt's last CT showed atelectasis, pts provider is concerned it is worse.   Allergies Allergies  Allergen Reactions  . Aspirin     Level of Care/Admitting Diagnosis ED Disposition    ED Disposition Condition [facility] Area: [place] [[id number]]  Level of Care: Telemetry [5]  Admit to tele based on following criteria: Other see comments  Comments: Monitor hemodynamics  Diagnosis: Acute respiratory failure with hypoxia [facility]) [[id number]]  Admitting Physician: Shela Leff [[id number]]  Attending Physician: Shela Leff [[id number]]  PT Class (Do Not Modify): Observation [104]  PT Acc Code (Do Not Modify): Observation [10022]       B Medical/Surgery History Past Medical History:  Diagnosis Date  . Arthritis   . Cancer [facility])    breast  . Family history of breast cancer   . Family history of colon cancer    Past Surgical History:  Procedure Laterality Date  . PATELLA REALIGNMENT Left 1992     A IV Location/Drains/Wounds Patient Lines/Drains/Airways Status   Active Line/Drains/Airways    Name:   Placement date:   Placement time:   Site:   Days:   Peripheral IV 10/17/18 Left Antecubital   10/17/18    1710    Antecubital   1          Intake/Output Last 24 hours No intake or output data in the 24 hours ending 10/18/18 0145  Labs/Imaging Results for orders placed or performed  during the hospital encounter of 10/17/18 (from the past 48 hour(s))  CBC WITH DIFFERENTIAL     Status: Abnormal   Collection Time: 10/17/18  4:12 PM  Result Value Ref Range   WBC 8.4 4.0 - 10.5 K/uL   RBC 4.44 3.87 - 5.11 MIL/uL   Hemoglobin 12.6 12.0 - 15.0 g/dL   HCT 40.9 36.0 - 46.0 %   MCV 92.1 80.0 - 100.0 fL   MCH 28.4 26.0 - 34.0 pg   MCHC 30.8 30.0 - 36.0 g/dL   RDW 13.1 11.5 - 15.5 %   Platelets 513 (H) 150 - 400 K/uL   nRBC 0.0 0.0 - 0.2 %   Neutrophils Relative % 67 %   Neutro Abs 5.6 1.7 - 7.7 K/uL   Lymphocytes Relative 22 %   Lymphs Abs 1.8 0.7 - 4.0 K/uL   Monocytes Relative 10 %   Monocytes Absolute 0.8 0.1 - 1.0 K/uL   Eosinophils Relative 0 %   Eosinophils Absolute 0.0 0.0 - 0.5 K/uL   Basophils Relative 0 %   Basophils Absolute 0.0 0.0 - 0.1 K/uL   Immature Granulocytes 1 %   Abs Immature Granulocytes 0.05 0.00 - 0.07 K/uL    Comment: Performed at [facility], [place] [address] [place], [place] [address]  Comprehensive metabolic panel     Status: Abnormal   Collection Time: 10/17/18  4:12 PM  Result Value  Ref Range   Sodium 139 135 - 145 mmol/L   Potassium 4.2 3.5 - 5.1 mmol/L   Chloride 106 98 - 111 mmol/L   CO2 25 22 - 32 mmol/L   Glucose, Bld 89 70 - 99 mg/dL   BUN 11 6 - 20 mg/dL   Creatinine, Ser 0.90 0.44 - 1.00 mg/dL   Calcium 9.0 8.9 - 10.3 mg/dL   Total Protein 7.9 6.5 - 8.1 g/dL   Albumin 3.8 3.5 - 5.0 g/dL   AST 42 (H) 15 - 41 U/L   ALT 29 0 - 44 U/L   Alkaline Phosphatase 70 38 - 126 U/L   Total Bilirubin 0.8 0.3 - 1.2 mg/dL   GFR calc non Af Amer >60 >60 mL/min   GFR calc Af Amer >60 >60 mL/min   Anion gap 8 5 - 15    Comment: Performed at [facility], [place] [address] [place], [place] [address]  Brain natriuretic peptide     Status: None   Collection Time: 10/17/18  4:12 PM  Result Value Ref Range   B Natriuretic Peptide 39.8 0.0 - 100.0 pg/mL    Comment: Performed at [facility], [place] [address] [place], [place] [address]  Troponin I - ONCE - STAT     Status: Abnormal   Collection Time: 10/17/18  4:12 PM  Result Value Ref Range   Troponin I 0.08 (HH) <0.03 ng/mL    Comment: CRITICAL RESULT CALLED TO, READ BACK BY AND VERIFIED WITH: S.BINGHAM AT 1729 ON 10/17/18 BY N.THOMPSON Performed at [facility], [place] [address] [place], [place] [address]   Protime-INR     Status: None   Collection Time: 10/17/18  4:12 PM  Result Value Ref Range   Prothrombin Time 12.1 11.4 - 15.2 seconds   INR 0.9 0.8 - 1.2    Comment: (NOTE) INR goal varies based on device and disease states. Performed at [facility], [place] [address], [place], [place] [address]    Dg Chest 2 View  Result Date: 10/17/2018 CLINICAL DATA:  43 year old female with breast cancer and shortness of breath requiring rapid response. EXAM: CHEST - 2 VIEW COMPARISON:  [facility] chest CT 09/30/2018. FINDINGS: Numerous rounded bilateral pulmonary metastases redemonstrated with superimposed small left pleural effusion. Mildly decreased left lung volume and decreased left lung ventilation compared to the February CT. No superimposed pneumothorax or pulmonary edema. Stable cardiac size and mediastinal contours. Visualized tracheal air column is within normal limits. Negative visible bowel gas pattern. Asymmetric left breast enlargement redemonstrated. No osseous lesion is evident. IMPRESSION: Extensive pulmonary metastatic disease with small left pleural effusion and worsening left lung base ventilation since the February CT. Electronically Signed   By: Genevie Ann M.D.   On: 10/17/2018 17:51   Ct Angio Chest Pe W And/or Wo Contrast  Result Date: 10/17/2018 CLINICAL DATA:  Cough and shortness of breath. Recently diagnosed breast cancer. EXAM: CT ANGIOGRAPHY CHEST WITH CONTRAST TECHNIQUE: Multidetector CT imaging of the chest was performed using the standard protocol  during bolus administration of intravenous contrast. Multiplanar CT image reconstructions and MIPs were obtained to evaluate the vascular anatomy. CONTRAST:  141mL OMNIPAQUE IOHEXOL 350 MG/ML SOLN COMPARISON:  09/30/2018 FINDINGS: Cardiovascular: No lobar or more central pulmonary embolus is identified. Segmental assessment is limited by motion artifact and patient body habitus. The heart is normal in size. There is no pericardial effusion. The thoracic aorta is normal in size.  Mediastinum/Nodes: Again partially visualized are multiple left breast masses with left greater than right breast skin thickening. A 6 x 6 cm mass invading the left pectoralis major muscle is unchanged, and there is a similar appearance of diffuse regional edema. Multiple enlarged left axillary and subpectoral lymph nodes are again partially visualized measuring up to approximately 4 cm in long axis. Partially visualized left supraclavicular lymph nodes are similar to the prior CT. Partially visualized right axillary lymph nodes appear mildly larger than on the prior CT and measure up to 1.5 cm in short axis. Enlarged mediastinal lymph nodes have not significantly changed with the largest measuring 1.9 cm in short axis in the pre-vascular region. Lungs/Pleura: A moderate-sized left pleural effusion has enlarged from the prior CT with increasing atelectasis in the left lower lobe and to a lesser extent lingula. There is the suggestion of mild underlying pleural thickening, however a discrete pleural mass is not identified. Numerous bilateral lung nodules are again seen with multiple demonstrating a mild interval increase in size including a 3.3 x 3.1 cm left upper lobe mass (series 6, image 48, previously 3.1 x 3.0 cm) and a 2.0 cm right upper lobe nodule (series 6, image 50, previously 1.7 cm). Motion artifact and the enlarging left pleural effusion and atelectasis obscure some nodules. Upper Abdomen: Subcentimeter hypodensity in the hepatic  dome on the prior CT is obscured by motion artifact on today's study. Musculoskeletal: No acute osseous abnormality or suspicious osseous lesion. Review of the MIP images confirms the above findings. IMPRESSION: 1. No evidence of lobar or more central pulmonary embolus. Nondiagnostic segmental assessment. 2. Moderate left pleural effusion, increased in size from the prior CT with increasing left lung atelectasis. 3. Unchanged appearance of multiple left breast masses including an invasive mass involving the pectoralis major. 4. Unchanged left axillary, left subpectoral, and mediastinal lymphadenopathy. 5. Mild interval enlargement of multiple lung metastases and of right axillary lymph nodes. Electronically Signed   By: Logan Bores M.D.   On: 10/17/2018 20:05    Pending Labs Unresulted Labs (From admission, onward)    Start     Ordered   10/18/18 0500  Lactate dehydrogenase  Tomorrow morning,   R     10/18/18 0052   10/18/18 0049  Troponin I - Now Then Q6H  Now then every 6 hours,   R     10/18/18 0051   10/18/18 0047  HIV antibody (Routine Testing)  Once,   R     10/18/18 0051          Vitals/Pain Today's Vitals   10/18/18 0000 10/18/18 0034 10/18/18 0100 10/18/18 0140  BP: (!) 99/55 (!) 99/55 103/61 126/87  Pulse: 99 (!) 103 (!) 101 (!) 102  Resp:  (!) 21  20  Temp:    98.4 F (36.9 C)  TempSrc:    Oral  SpO2: 95% 98% 90% 98%  Weight:      Height:      PainSc:    8     Isolation Precautions No active isolations  Medications Medications  sodium chloride (PF) 0.9 % injection (has no administration in time range)  heparin injection 5,000 Units (has no administration in time range)  acetaminophen (TYLENOL) tablet 650 mg (has no administration in time range)    Or  acetaminophen (TYLENOL) suppository 650 mg (has no administration in time range)  HYDROcodone-acetaminophen (NORCO/VICODIN) 5-325 MG per tablet 1-2 tablet (2 tablets Oral Given 10/18/18 0139)  ipratropium-albuterol  (DUONEB) 0.5-2.5 (  3) MG/3ML nebulizer solution 3 mL (3 mLs Nebulization Given 10/17/18 1619)  iohexol (OMNIPAQUE) 350 MG/ML injection 100 mL (100 mLs Intravenous Contrast Given 10/17/18 1929)    Mobility walks Moderate fall risk   Focused Assessments Pulmonary Assessment Handoff:  Lung sounds:   O2 Device: Room Air O2 Flow Rate (L/min): 3 L/min      R Recommendations: See Admitting Provider Note  Report given to:   Additional Notes: condition staBLE

## 2018-10-18 NOTE — Progress Notes (Addendum)
I saw the patient this am in our CT simulation room. She was able to tolerate simulation. She is going for left thoracentesis for dx and therapeutic purposes this am, as well as left supraclavicular biopsy and PAC placement this afternoon. It appears that she also has CT imaging planned of the abdomen and pelvis and MRI brain as well to complete her staging. We anticipate starting radiation treatment to the left breast and regional nodes on Monday to allow for tissue confirmation of her suspected breast cancer diagnosis.    Carola Rhine, PAC

## 2018-10-18 NOTE — Consult Note (Addendum)
Chief Complaint: Patient was seen in consultation today for port a cath placement and left axillary lymph node biopsy  Referring Physician(s): Akula,V/Zhao,Y  Supervising Physician: Markus Daft  Patient Status: [facility] - In-pt  History of Present Illness: Sharon Floyd is a 43 y.o. female with recent findings of suspected metastatic breast cancer to the lungs in late 09/2018 when she presented to [facility] ER with cough and left sided chest pain. Patient followed up with oncology, and imaging on 09/30/18 confirmed dermal thickening of multiple masses within left breast compatible with primary breast cancer, numerous pulmonary metastases, and left axillary, supraclavicular, and upper mediastinal metastatic lymphadenopathy. Patient has yet to have tissue diagnosis. On 3/10, patient presented to [facility] ER with complaint of shortness of breath, and imaging found pleural effusion on L side. Request has been made for left axillary lymph node biopsy, Port-A-Cath placement, and thoracentesis of L pleural effusion.   Past Medical History:  Diagnosis Date  . Arthritis   . Cancer [facility])    breast  . Family history of breast cancer   . Family history of colon cancer     Past Surgical History:  Procedure Laterality Date  . PATELLA REALIGNMENT Left 1992    Allergies: Aspirin  Medications: Prior to Admission medications   Medication Sig Start Date End Date Taking? Authorizing Provider  naproxen sodium (ALEVE) 220 MG tablet Take 220 mg by mouth daily as needed (pain).    Yes [provider]  LORazepam (ATIVAN) 0.5 MG tablet Take 0.5 tablets (0.25 mg total) by mouth once as needed for up to 2 doses for anxiety (Prior to PET and MRI scan). 10/12/18/    Tish Men, MD  oxyCODONE (OXY IR/ROXICODONE) 5 MG immediate release tablet Take 0.5-1 tablets (2.5-5 mg total) by mouth every 4 (four) hours as needed for severe pain. 10/17/18   Hayden Pedro, PA-C     Family History  Problem Relation Age  of Onset  . Arthritis Mother   . Diabetes Father   . Hypertension Father   . Arthritis Father   . Heart disease Father   . Early death Maternal Grandmother   . Asthma Maternal Grandfather   . Heart disease Maternal Grandfather   . Migraines Maternal Grandfather   . Early death Maternal Grandfather   . Asthma Paternal Grandmother   . Hypertension Paternal Grandmother   . Asthma Paternal Grandfather   . Hypertension Paternal Grandfather   . Breast cancer Other        paternal cousin, female  . Colon cancer Cousin        paternal  . Breast cancer Cousin        paternal  . Lung cancer Other     Social History   Socioeconomic History  . Marital status: Single    Spouse name: Not on file  . Number of children: Not on file  . Years of education: Not on file  . Highest education level: Not on file  Occupational History  . Not on file  Social Needs  . Financial resource strain: Not on file  . Food insecurity:    Worry: Not on file    Inability: Not on file  . Transportation needs:    Medical: No    Non-medical: No  Tobacco Use  . Smoking status: Former Smoker    Types: Cigarettes    Last attempt to quit: 10/09/2018    Years since quitting: 0.0  . Smokeless tobacco: Never Used  Substance and  Sexual Activity  . Alcohol use: Not Currently  . Drug use: Never  . Sexual activity: Not on file  Lifestyle  . Physical activity:    Days per week: Not on file    Minutes per session: Not on file  . Stress: Not on file  Relationships  . Social connections:    Talks on phone: Not on file    Gets together: Not on file    Attends religious service: Not on file    Active member of club or organization: Not on file    Attends meetings of clubs or organizations: Not on file    Relationship status: Not on file  Other Topics Concern  . Not on file  Social History Narrative  . Not on file      Review of Systems  Patient denies headache, fever, chills, abdominal pain, vomiting,  chest pain, and bleeding. Patient confirms shortness of breath, cough, and some occasional nausea. Patient says she did not eat today.  Vital Signs: BP 112/87 (BP Location: Right Arm)   Pulse 74   Temp 98.2 F (36.8 C) (Oral)   Resp 19   Ht 5\' 4"  (1.626 m)   Wt 267 lb (121.1 kg)   LMP 10/04/2018   SpO2 98%   BMI 45.83 kg/m   Physical Exam  Patient is awake and alert, resting comfortably in bed. Breathing was mildly labored with exertion; distant breath sounds in left lower lung field; regular rate and rhythm without murmurs/rubs, abdomen was soft and non-tender to palpation with normal bowel sounds; no LE edema noted  Imaging: Dg Chest 2 View  Result Date: 10/17/2018 CLINICAL DATA:  43 year old female with breast cancer and shortness of breath requiring rapid response. EXAM: CHEST - 2 VIEW COMPARISON:  [facility] chest CT 09/30/2018. FINDINGS: Numerous rounded bilateral pulmonary metastases redemonstrated with superimposed small left pleural effusion. Mildly decreased left lung volume and decreased left lung ventilation compared to the February CT. No superimposed pneumothorax or pulmonary edema. Stable cardiac size and mediastinal contours. Visualized tracheal air column is within normal limits. Negative visible bowel gas pattern. Asymmetric left breast enlargement redemonstrated. No osseous lesion is evident. IMPRESSION: Extensive pulmonary metastatic disease with small left pleural effusion and worsening left lung base ventilation since the February CT. Electronically Signed   By: Genevie Ann M.D.   On: 10/17/2018 17:51   Ct Angio Chest Pe W And/or Wo Contrast  Result Date: 10/17/2018 CLINICAL DATA:  Cough and shortness of breath. Recently diagnosed breast cancer. EXAM: CT ANGIOGRAPHY CHEST WITH CONTRAST TECHNIQUE: Multidetector CT imaging of the chest was performed using the standard protocol during bolus administration of intravenous contrast. Multiplanar CT image  reconstructions and MIPs were obtained to evaluate the vascular anatomy. CONTRAST:  12mL OMNIPAQUE IOHEXOL 350 MG/ML SOLN COMPARISON:  09/30/2018 FINDINGS: Cardiovascular: No lobar or more central pulmonary embolus is identified. Segmental assessment is limited by motion artifact and patient body habitus. The heart is normal in size. There is no pericardial effusion. The thoracic aorta is normal in size. Mediastinum/Nodes: Again partially visualized are multiple left breast masses with left greater than right breast skin thickening. A 6 x 6 cm mass invading the left pectoralis major muscle is unchanged, and there is a similar appearance of diffuse regional edema. Multiple enlarged left axillary and subpectoral lymph nodes are again partially visualized measuring up to approximately 4 cm in long axis. Partially visualized left supraclavicular lymph nodes are similar to the prior CT. Partially visualized right  axillary lymph nodes appear mildly larger than on the prior CT and measure up to 1.5 cm in short axis. Enlarged mediastinal lymph nodes have not significantly changed with the largest measuring 1.9 cm in short axis in the pre-vascular region. Lungs/Pleura: A moderate-sized left pleural effusion has enlarged from the prior CT with increasing atelectasis in the left lower lobe and to a lesser extent lingula. There is the suggestion of mild underlying pleural thickening, however a discrete pleural mass is not identified. Numerous bilateral lung nodules are again seen with multiple demonstrating a mild interval increase in size including a 3.3 x 3.1 cm left upper lobe mass (series 6, image 48, previously 3.1 x 3.0 cm) and a 2.0 cm right upper lobe nodule (series 6, image 50, previously 1.7 cm). Motion artifact and the enlarging left pleural effusion and atelectasis obscure some nodules. Upper Abdomen: Subcentimeter hypodensity in the hepatic dome on the prior CT is obscured by motion artifact on today's study.  Musculoskeletal: No acute osseous abnormality or suspicious osseous lesion. Review of the MIP images confirms the above findings. IMPRESSION: 1. No evidence of lobar or more central pulmonary embolus. Nondiagnostic segmental assessment. 2. Moderate left pleural effusion, increased in size from the prior CT with increasing left lung atelectasis. 3. Unchanged appearance of multiple left breast masses including an invasive mass involving the pectoralis major. 4. Unchanged left axillary, left subpectoral, and mediastinal lymphadenopathy. 5. Mild interval enlargement of multiple lung metastases and of right axillary lymph nodes. Electronically Signed   By: Logan Bores M.D.   On: 10/17/2018 20:05    Labs:  CBC: Recent Labs    10/12/18 1030 10/17/18 1612  WBC 7.4 8.4  HGB 13.0 12.6  HCT 40.3 40.9  PLT 504* 513*    COAGS: Recent Labs    10/17/18 1612  INR 0.9    BMP: Recent Labs    10/12/18 1030 10/17/18 1612  NA 139 139  K 4.2 4.2  CL 103 106  CO2 27 25  GLUCOSE 111* 89  BUN 11 11  CALCIUM 9.8 9.0  CREATININE 0.99 0.90  GFRNONAA >60 >60  GFRAA >60 >60    LIVER FUNCTION TESTS: Recent Labs    10/12/18 1030 10/17/18 1612  BILITOT 0.5 0.8  AST 20 42*  ALT 19 29  ALKPHOS 65 70  PROT 7.7 7.9  ALBUMIN 4.1 3.8    TUMOR MARKERS: No results for input(s): AFPTM, CEA, CA199, CHROMGRNA in the last 8760 hours.  Assessment and Plan: Malignant neoplasm of overlapping sites of left  breast; left axillary, subpectoral, and supraclavicular lymphadenopathy; acute respiratory failure with hypoxia; moderate left sided pleural effusion; stage IV breast cancer suspected Plan is to perform US guided left thoracentesis this morning and perform L axillary LN biopsy and Port-A-Cath placement this afternoon in IR.  Imaging was reviewed by Dr. Anselm Pancoast; labs are stable Risks and benefits of biopsy, thoracentesis, and port-a-cath placement was discussed with the patient and/or patient's family  including, but not limited to bleeding, infection, damage to adjacent structures or low yield requiring additional tests.  All of the questions were answered and there is agreement to proceed.  Consent signed and in chart.   Thank you for this interesting consult.  I greatly enjoyed meeting Sharon Floyd and look forward to participating in their care.  A copy of this report was sent to the requesting provider on this date.  Electronically Signed: D. Rowe Robert, PA-C 10/18/2018, 9:26 AM   I spent a total  of 25 minutes    in face to face in clinical consultation, greater than 50% of which was counseling/coordinating care for port a cath placement/image guided left axillary lymph node biopsy

## 2018-10-18 NOTE — Progress Notes (Signed)
Pt unable to fit in [facility]. [facility] contacted, pt has 2pm appointment. [facility] contacted, pt transport set up for 1:30pm.

## 2018-10-18 NOTE — Procedures (Signed)
Ultrasound-guided diagnostic and therapeutic left thoracentesis performed yielding 1.2 liters of hazy, yellow fluid. No immediate complications. Follow-up chest x-ray pending.The fluid was sent to the lab for preordered studies. EBL none.

## 2018-10-18 NOTE — Progress Notes (Signed)
[facility] INPATIENT PROGRESS NOTE  SUBJECTIVE: Sharon Floyd 43 y.o. female with suspected stage IV metastatic breast cancer.  Biopsy and additional staging are pending.  The patient was admitted to the hospital for hypoxia and dyspnea.  CT angiogram the chest performed in the emergency room showed no evidence of pulmonary embolism.  There was a moderate left pleural effusion which was increased in size, unchanged pulmonary metastasis and left axillary, subpectoral, and mediastinal lymphadenopathy.  There is mild interval enlargement of the multiple lung metastases and right axillary lymph nodes.  The patient reports her breathing is better today since being on oxygen.  Denies fevers and chills.  Denies chest pain.  Denies nausea, vomiting, constipation, diarrhea.  No bleeding.  ALLERGIES:  is allergic to aspirin.  MEDICATIONS:  Current Facility-Administered Medications  Medication Dose Route Frequency Provider Last Rate Last Dose  . acetaminophen (TYLENOL) tablet 650 mg  650 mg Oral Q6H PRN Shela Leff, MD       Or  . acetaminophen (TYLENOL) suppository 650 mg  650 mg Rectal Q6H PRN Shela Leff, MD      . HYDROcodone-acetaminophen (NORCO/VICODIN) 5-325 MG per tablet 1-2 tablet  1-2 tablet Oral Q6H PRN Shela Leff, MD   2 tablet at 10/18/18 0139    REVIEW OF SYSTEMS:    Review of Systems  Constitutional: Positive for malaise/fatigue. Negative for chills and fever.  HENT: Negative.   Eyes: Negative.   Respiratory: Positive for shortness of breath. Negative for cough and hemoptysis.   Cardiovascular: Negative.   Gastrointestinal: Negative.   Genitourinary: Negative.   Musculoskeletal: Negative.   Skin: Negative for rash.  Neurological: Negative.   Endo/Heme/Allergies: Negative.   Psychiatric/Behavioral: Negative.       PHYSICAL EXAMINATION:  Vital signs in last 24 hours: Temp:  [98.2 F (36.8 C)-99.1 F (37.3 C)] 98.2 F (36.8 C) (03/11  0603) Pulse Rate:  [74-105] 74 (03/11 0603) Resp:  [16-28] 19 (03/11 0603) BP: (99-168)/(55-108) 112/87 (03/11 0603) SpO2:  [90 %-99 %] 98 % (03/11 0603) Weight:  [267 lb (121.1 kg)-267 lb 3.2 oz (121.2 kg)] 267 lb (121.1 kg) (03/10 1551) Weight change:  Last BM Date: 10/18/18  ECOG PERFORMANCE STATUS: 1 - Symptomatic but completely ambulatory  Intake/Output from previous day: No intake/output data recorded. General: Alert, awake without distress. Head: Normocephalic atraumatic. Mouth: mucous membranes moist, pharynx normal without lesions Eyes: No scleral icterus.  Pupils are equal and round reactive to light. Lymph: Palpable approximately 1.5 cm left supraclavicular lymph node and left axillary adenopathy Resp: Diminished breath sounds to the left lung base.  Clear on the right. Cardio: regular rate and rhythm, S1, S2 normal, no murmur, click, rub or gallop GI: soft, non-tender; bowel sounds normal; no masses,  no organomegaly Musculoskeletal: No joint deformity or effusion. Neurological: No motor, sensory deficits.  Intact deep tendon reflexes. Skin: No rashes or lesions. Breasts: A large, firm left breast with extensive peau d'orange changes in the entire breast, right breast unremarkable   LABORATORY DATA: Lab Results  Component Value Date   WBC 8.4 10/17/2018   HGB 12.6 10/17/2018   HCT 40.9 10/17/2018   MCV 92.1 10/17/2018   PLT 513 (H) 10/17/2018   CMP Latest Ref Rng & Units 10/17/2018 10/12/2018  Glucose 70 - 99 mg/dL 89 111(H)  BUN 6 - 20 mg/dL 11 11  Creatinine 0.44 - 1.00 mg/dL 0.90 0.99  Sodium 135 - 145 mmol/L 139 139  Potassium 3.5 - 5.1 mmol/L 4.2 4.2  Chloride 98 - 111 mmol/L 106 103  CO2 22 - 32 mmol/L 25 27  Calcium 8.9 - 10.3 mg/dL 9.0 9.8  Total Protein 6.5 - 8.1 g/dL 7.9 7.7  Total Bilirubin 0.3 - 1.2 mg/dL 0.8 0.5  Alkaline Phos 38 - 126 U/L 70 65  AST 15 - 41 U/L 42(H) 20  ALT 0 - 44 U/L 29 19    RADIOGRAPHIC STUDIES:  Dg Chest 2  View  Result Date: 10/17/2018 CLINICAL DATA:  43 year old female with breast cancer and shortness of breath requiring rapid response. EXAM: CHEST - 2 VIEW COMPARISON:  [facility] chest CT 09/30/2018. FINDINGS: Numerous rounded bilateral pulmonary metastases redemonstrated with superimposed small left pleural effusion. Mildly decreased left lung volume and decreased left lung ventilation compared to the February CT. No superimposed pneumothorax or pulmonary edema. Stable cardiac size and mediastinal contours. Visualized tracheal air column is within normal limits. Negative visible bowel gas pattern. Asymmetric left breast enlargement redemonstrated. No osseous lesion is evident. IMPRESSION: Extensive pulmonary metastatic disease with small left pleural effusion and worsening left lung base ventilation since the February CT. Electronically Signed   By: Genevie Ann M.D.   On: 10/17/2018 17:51   Ct Angio Chest Pe W And/or Wo Contrast  Result Date: 10/17/2018 CLINICAL DATA:  Cough and shortness of breath. Recently diagnosed breast cancer. EXAM: CT ANGIOGRAPHY CHEST WITH CONTRAST TECHNIQUE: Multidetector CT imaging of the chest was performed using the standard protocol during bolus administration of intravenous contrast. Multiplanar CT image reconstructions and MIPs were obtained to evaluate the vascular anatomy. CONTRAST:  181mL OMNIPAQUE IOHEXOL 350 MG/ML SOLN COMPARISON:  09/30/2018 FINDINGS: Cardiovascular: No lobar or more central pulmonary embolus is identified. Segmental assessment is limited by motion artifact and patient body habitus. The heart is normal in size. There is no pericardial effusion. The thoracic aorta is normal in size. Mediastinum/Nodes: Again partially visualized are multiple left breast masses with left greater than right breast skin thickening. A 6 x 6 cm mass invading the left pectoralis major muscle is unchanged, and there is a similar appearance of diffuse regional edema.  Multiple enlarged left axillary and subpectoral lymph nodes are again partially visualized measuring up to approximately 4 cm in long axis. Partially visualized left supraclavicular lymph nodes are similar to the prior CT. Partially visualized right axillary lymph nodes appear mildly larger than on the prior CT and measure up to 1.5 cm in short axis. Enlarged mediastinal lymph nodes have not significantly changed with the largest measuring 1.9 cm in short axis in the pre-vascular region. Lungs/Pleura: A moderate-sized left pleural effusion has enlarged from the prior CT with increasing atelectasis in the left lower lobe and to a lesser extent lingula. There is the suggestion of mild underlying pleural thickening, however a discrete pleural mass is not identified. Numerous bilateral lung nodules are again seen with multiple demonstrating a mild interval increase in size including a 3.3 x 3.1 cm left upper lobe mass (series 6, image 48, previously 3.1 x 3.0 cm) and a 2.0 cm right upper lobe nodule (series 6, image 50, previously 1.7 cm). Motion artifact and the enlarging left pleural effusion and atelectasis obscure some nodules. Upper Abdomen: Subcentimeter hypodensity in the hepatic dome on the prior CT is obscured by motion artifact on today's study. Musculoskeletal: No acute osseous abnormality or suspicious osseous lesion. Review of the MIP images confirms the above findings. IMPRESSION: 1. No evidence of lobar or more central pulmonary embolus. Nondiagnostic segmental assessment. 2. Moderate  left pleural effusion, increased in size from the prior CT with increasing left lung atelectasis. 3. Unchanged appearance of multiple left breast masses including an invasive mass involving the pectoralis major. 4. Unchanged left axillary, left subpectoral, and mediastinal lymphadenopathy. 5. Mild interval enlargement of multiple lung metastases and of right axillary lymph nodes. Electronically Signed   By: Logan Bores M.D.    On: 10/17/2018 20:05     ASSESSMENT/PLAN:  1.  Suspected metastatic breast cancer to the lungs.  The patient presented with a several week history of cough and skin changes to her left breast.  She has now been admitted to the hospital with worsening shortness of breath and hypoxia. -Thoracentesis today.  We will send fluid for pleural fluid analysis. -Axillary lymph node biopsy by interventional radiology today. -Port-A-Cath will be placed for pending chemotherapy interventional radiology. -A CT of the abdomen and pelvis has been ordered for staging purposes. -An MRI of the brain has been ordered for staging purposes. -The patient may need an echocardiogram as baseline prior to potential chemotherapy.   LOS: 0 days   Mikey Bussing, DNP, AGPCNP-BC, AOCNP 10/18/18

## 2018-10-18 NOTE — Progress Notes (Signed)
Sharon Floyd is a 43 y.o. female with medical history significant of recent diagnosis of suspected stage IV breast cancer with lung mets presenting to the hospital for evaluation of shortness of breath.  Patient presented from the cancer center where oxygen saturation was 77 to 82% on room air and she was placed on 2 L supplemental oxygen.  Patient reports having dyspnea on exertion for the past few weeks; worse this past 1 week.   Plan: 1. IR guided biopsy of LN 2. Thoracentesis.    Hosie Poisson, MD [phone]

## 2018-10-18 NOTE — Progress Notes (Signed)
Spoke to Dr. Edison Pace attempted, however, unable to visualize heart due to breast swelling.

## 2018-10-19 ENCOUNTER — Ambulatory Visit (HOSPITAL_BASED_OUTPATIENT_CLINIC_OR_DEPARTMENT_OTHER): Admission: RE | Admit: 2018-10-19 | Payer: BLUE CROSS/BLUE SHIELD | Source: Ambulatory Visit

## 2018-10-19 ENCOUNTER — Encounter (HOSPITAL_COMMUNITY): Payer: Self-pay | Admitting: Interventional Radiology

## 2018-10-19 ENCOUNTER — Inpatient Hospital Stay (HOSPITAL_COMMUNITY): Payer: BLUE CROSS/BLUE SHIELD

## 2018-10-19 ENCOUNTER — Inpatient Hospital Stay: Payer: BLUE CROSS/BLUE SHIELD

## 2018-10-19 ENCOUNTER — Other Ambulatory Visit: Payer: Self-pay | Admitting: Radiation Therapy

## 2018-10-19 ENCOUNTER — Ambulatory Visit: Payer: BLUE CROSS/BLUE SHIELD | Admitting: Radiation Oncology

## 2018-10-19 DIAGNOSIS — C7931 Secondary malignant neoplasm of brain: Secondary | ICD-10-CM

## 2018-10-19 DIAGNOSIS — C50912 Malignant neoplasm of unspecified site of left female breast: Secondary | ICD-10-CM

## 2018-10-19 DIAGNOSIS — J9601 Acute respiratory failure with hypoxia: Secondary | ICD-10-CM

## 2018-10-19 DIAGNOSIS — C7949 Secondary malignant neoplasm of other parts of nervous system: Principal | ICD-10-CM

## 2018-10-19 DIAGNOSIS — C770 Secondary and unspecified malignant neoplasm of lymph nodes of head, face and neck: Secondary | ICD-10-CM | POA: Diagnosis not present

## 2018-10-19 HISTORY — PX: IR US GUIDE BX ASP/DRAIN: IMG2392

## 2018-10-19 HISTORY — PX: IR IMAGING GUIDED PORT INSERTION: IMG5740

## 2018-10-19 LAB — CBC WITH DIFFERENTIAL/PLATELET
Abs Immature Granulocytes: 0.04 10*3/uL (ref 0.00–0.07)
Basophils Absolute: 0 10*3/uL (ref 0.0–0.1)
Basophils Relative: 0 %
EOS ABS: 0.1 10*3/uL (ref 0.0–0.5)
Eosinophils Relative: 1 %
HCT: 38.3 % (ref 36.0–46.0)
Hemoglobin: 11.8 g/dL — ABNORMAL LOW (ref 12.0–15.0)
IMMATURE GRANULOCYTES: 1 %
Lymphocytes Relative: 16 %
Lymphs Abs: 1.2 10*3/uL (ref 0.7–4.0)
MCH: 29 pg (ref 26.0–34.0)
MCHC: 30.8 g/dL (ref 30.0–36.0)
MCV: 94.1 fL (ref 80.0–100.0)
Monocytes Absolute: 0.9 10*3/uL (ref 0.1–1.0)
Monocytes Relative: 13 %
NEUTROS PCT: 69 %
Neutro Abs: 4.9 10*3/uL (ref 1.7–7.7)
Platelets: 422 10*3/uL — ABNORMAL HIGH (ref 150–400)
RBC: 4.07 MIL/uL (ref 3.87–5.11)
RDW: 13 % (ref 11.5–15.5)
WBC: 7 10*3/uL (ref 4.0–10.5)
nRBC: 0 % (ref 0.0–0.2)

## 2018-10-19 LAB — ECHOCARDIOGRAM COMPLETE
Height: 64 in
Weight: 4272 oz

## 2018-10-19 LAB — PH, BODY FLUID: pH, Body Fluid: 7.4

## 2018-10-19 MED ORDER — FENTANYL CITRATE (PF) 100 MCG/2ML IJ SOLN
INTRAMUSCULAR | Status: AC | PRN
  Administered 2018-10-19: 25 ug via INTRAVENOUS
  Administered 2018-10-19: 50 ug via INTRAVENOUS

## 2018-10-19 MED ORDER — INSULIN ASPART 100 UNIT/ML ~~LOC~~ SOLN: 0.0000 [IU] | Freq: Three times a day (TID) | SUBCUTANEOUS | Status: DC

## 2018-10-19 MED ORDER — MORPHINE SULFATE (PF) 2 MG/ML IV SOLN: 1.0000 mg | INTRAVENOUS | Status: DC | PRN

## 2018-10-19 MED ORDER — MIDAZOLAM HCL 2 MG/2ML IJ SOLN
INTRAMUSCULAR | Status: AC | PRN
  Administered 2018-10-19: 1 mg via INTRAVENOUS

## 2018-10-19 MED ORDER — HEPARIN SODIUM (PORCINE) 5000 UNIT/ML IJ SOLN
5000.0000 [IU] | Freq: Three times a day (TID) | INTRAMUSCULAR | Status: DC
  Filled 2018-10-19: qty 1

## 2018-10-19 MED ORDER — FENTANYL CITRATE (PF) 100 MCG/2ML IJ SOLN
INTRAMUSCULAR | Status: AC
  Filled 2018-10-19: qty 4

## 2018-10-19 MED ORDER — IOHEXOL 300 MG/ML  SOLN
100.0000 mL | Freq: Once | INTRAMUSCULAR | Status: AC | PRN
  Administered 2018-10-19: 100 mL via INTRAVENOUS

## 2018-10-19 MED ORDER — PERFLUTREN LIPID MICROSPHERE
1.0000 mL | INTRAVENOUS | Status: AC | PRN
  Administered 2018-10-19: 2 mL via INTRAVENOUS
  Filled 2018-10-19: qty 10

## 2018-10-19 MED ORDER — INSULIN ASPART 100 UNIT/ML ~~LOC~~ SOLN: 0.0000 [IU] | Freq: Every day | SUBCUTANEOUS | Status: DC

## 2018-10-19 MED ORDER — IOHEXOL 300 MG/ML  SOLN: 15.0000 mL | Freq: Once | INTRAMUSCULAR | Status: DC | PRN

## 2018-10-19 MED ORDER — SODIUM CHLORIDE (PF) 0.9 % IJ SOLN
INTRAMUSCULAR | Status: AC
  Filled 2018-10-19: qty 50

## 2018-10-19 MED ORDER — OXYCODONE HCL 5 MG PO TABS
5.0000 mg | ORAL_TABLET | ORAL | Status: DC | PRN
  Administered 2018-10-19 – 2018-10-20 (×4): 10 mg via ORAL
  Filled 2018-10-19 (×4): qty 2

## 2018-10-19 MED ORDER — FENTANYL CITRATE (PF) 100 MCG/2ML IJ SOLN
INTRAMUSCULAR | Status: AC | PRN
  Administered 2018-10-19: 25 ug via INTRAVENOUS

## 2018-10-19 MED ORDER — MIDAZOLAM HCL 2 MG/2ML IJ SOLN
INTRAMUSCULAR | Status: AC
  Filled 2018-10-19: qty 4

## 2018-10-19 MED ORDER — LIDOCAINE-EPINEPHRINE (PF) 2 %-1:200000 IJ SOLN
INTRAMUSCULAR | Status: AC
  Filled 2018-10-19: qty 20

## 2018-10-19 MED ORDER — HEPARIN SOD (PORK) LOCK FLUSH 100 UNIT/ML IV SOLN
INTRAVENOUS | Status: AC
  Filled 2018-10-19: qty 5

## 2018-10-19 MED ORDER — LIDOCAINE HCL 1 % IJ SOLN
INTRAMUSCULAR | Status: AC
  Filled 2018-10-19: qty 20

## 2018-10-19 NOTE — Procedures (Signed)
Interventional Radiology Procedure Note  Procedure: Placement of a right IJ approach single lumen PowerPort.  Tip is positioned at the superior cavoatrial junction and catheter is ready for immediate use.   Left Supraclavicular LN biopsy with US guidance.   Complications: No immediate Recommendations:  - Ok to shower tomorrow - Do not submerge for 7 days - Routine line care   Signed,  Criselda Peaches, MD

## 2018-10-19 NOTE — Progress Notes (Signed)
  Echocardiogram 2D Echocardiogram has been performed.  Sharon Floyd 10/19/2018, 10:04 AM

## 2018-10-19 NOTE — Progress Notes (Signed)
PROGRESS NOTE    Sharon Floyd  IOE:[id number] DOB: 24-Nov-1975 DOA: 10/17/2018 PCP: Baruch Gouty, FNP   Brief Narrative: Sharon Floyd 43 y.o. female with suspected stage IV metastatic breast cancer,  was admitted to the hospital for hypoxia and dyspnea.  She underwent CT of the chest showing Moderate left pleural effusion, increased in size from the prior CT with increasing left lung atelectasis. 3. Unchanged appearance of multiple left breast masses including an invasive mass involving the pectoralis major. 4. Unchanged left axillary, left subpectoral, and mediastinal lymphadenopathy.  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  Thoracentesis and 1.2 L fluid taken out and sent for analysis. She underwent left supraclavicular lymph node biopsy and placement of right IJ single-lumen PowerPort by IR.  Patient still hypoxic and requiring up to 3 L of nasal cannula oxygen to keep sats greater than 90% especially during ambulation. Monitor her overnight watch to see if we can wean her off the oxygen in the next 24 hours.  Probably repeat chest x-ray tomorrow.    Metastatic disease to brain Radiation oncology consulted and currently waiting for biopsy results and schedule for radiation on Monday   Acute respiratory failure with hypoxia probably secondary to left pleural effusion from metastatic disease.   DVT prophylaxis: Heparin Code Status: Full code Family Communication: None at bedside  disposition Plan: Possible discharge tomorrow   Consultants:   Neurology  Radiation oncology  IR  Procedures: Supraclavicular lymph  node biopsy Right IJ port placed. MRI of the brain  Antimicrobials: None  Subjective: Coughing and hypoxia on room air on ambulation hence requiring up to 3 L of nasal cannula oxygen.  Objective: Vitals:   10/19/18 1555 10/19/18 1558 10/19/18 1605 10/19/18 1611  BP: 117/76 117/76 133/88 (!) 135/102  Pulse: 89 90 89 95  Resp: 14 14 17 11   Temp:      TempSrc:      SpO2: 95% 92% 94% 93%  Weight:      Height:        Intake/Output Summary (Last 24 hours) at 10/19/2018 1645 Last data filed at 10/19/2018 1200 Gross per 24 hour  Intake 0 ml  Output --  Net 0 ml   Filed Weights   10/17/18 1551  Weight: 121.1 kg    Examination:  General exam: still requiring up to 3 lit of [place] Oxygen.  Respiratory system: diminished on the left , no wheezing heard.  Cardiovascular system: S1 & S2 heard, RRR.  Gastrointestinal system: Abdomen is nondistended, soft and nontender. No organomegaly or masses felt. Normal bowel sounds heard. Central nervous system: Alert and oriented. No focal neurological deficits. Extremities: Symmetric 5 x 5 power. Skin: No rashes, lesions or ulcers Psychiatry: Mood & affect appropriate.     Data Reviewed: I have personally reviewed following labs and imaging studies  CBC: Recent Labs  Lab 10/17/18 1612 10/19/18 0625  WBC 8.4 7.0  NEUTROABS 5.6 4.9  HGB 12.6 11.8*  HCT 40.9 38.3  MCV 92.1 94.1  PLT 513* 182*   Basic Metabolic Panel: Recent Labs  Lab 10/17/18 1612  NA 139  K 4.2  CL 106  CO2 25  GLUCOSE 89  BUN 11  CREATININE 0.90  CALCIUM 9.0   GFR: Estimated Creatinine Clearance: 103.4 mL/min (by C-G formula based on SCr of 0.9 mg/dL). Liver Function Tests: Recent Labs  Lab 10/17/18 1612  AST 42*  ALT 29  ALKPHOS 70  BILITOT 0.8  PROT 7.9  ALBUMIN 3.8   No results for input(s): LIPASE, AMYLASE in the last 168 hours. No results for input(s): AMMONIA in the last 168 hours. Coagulation Profile: Recent Labs  Lab 10/17/18 1612   INR 0.9   Cardiac Enzymes: Recent Labs  Lab 10/17/18 1612 10/18/18 0443 10/18/18 1012 10/18/18 1647  TROPONINI 0.08* 0.04* 0.03* 0.03*   BNP (last 3 results) No results for input(s): PROBNP in the last 8760 hours. HbA1C: No results for input(s): HGBA1C in the last 72 hours. CBG: No results for input(s): GLUCAP in the last 168 hours. Lipid Profile: No results for input(s): CHOL, HDL, LDLCALC, TRIG, CHOLHDL, LDLDIRECT in the last 72 hours. Thyroid Function Tests: No results for input(s): TSH, T4TOTAL, FREET4, T3FREE, THYROIDAB in the last 72 hours. Anemia Panel: No results for input(s): VITAMINB12, FOLATE, FERRITIN, TIBC, IRON, RETICCTPCT in the last 72 hours. Sepsis Labs: No results for input(s): PROCALCITON, LATICACIDVEN in the last 168 hours.  Recent Results (from the past 240 hour(s))  Culture, body fluid-bottle     Status: None (Preliminary result)   Collection Time: 10/18/18 11:23 AM  Result Value Ref Range Status   Specimen Description PLEURAL LEFT  Final   Special Requests NONE  Final   Culture   Final    NO GROWTH 1 DAY Performed at [facility], [address] [place], [place] [address]    Report Status PENDING  Incomplete  Gram stain     Status: None   Collection Time: 10/18/18 11:23 AM  Result Value Ref Range Status   Specimen Description PLEURAL LEFT  Final   Special Requests NONE  Final   Gram Stain   Final    RARE WBC PRESENT, PREDOMINANTLY PMN NO ORGANISMS SEEN Performed at [facility], [place] [address], [place], [place] [address]    Report Status 10/18/2018 FINAL  Final         Radiology Studies: Dg Chest 1 View  Result Date: 10/18/2018 CLINICAL DATA:  Status post left-sided thoracentesis EXAM: CHEST  1 VIEW COMPARISON:  10/17/2018 FINDINGS: Cardiac shadows within normal limits. Significant reduction in left pleural effusion is noted following thoracentesis. No pneumothorax is noted. Minimal residual fluid remains. Multiple  pulmonary metastatic lesions are noted. No acute bony abnormality is seen. IMPRESSION: Stable pulmonary metastatic disease. Improving left-sided effusion following thoracentesis. No pneumothorax is noted. Electronically Signed   By: Inez Catalina M.D.   On: 10/18/2018 11:51   Dg Chest 2 View  Result Date: 10/17/2018 CLINICAL DATA:  43 year old female with breast cancer and shortness of breath requiring rapid response. EXAM: CHEST - 2 VIEW COMPARISON:  [facility] chest CT 09/30/2018. FINDINGS: Numerous rounded bilateral pulmonary metastases redemonstrated with superimposed small left pleural effusion. Mildly decreased left lung volume and decreased left lung ventilation compared to the February CT. No superimposed pneumothorax or pulmonary edema. Stable cardiac size and mediastinal contours. Visualized tracheal air column is within normal limits. Negative visible bowel gas pattern. Asymmetric left breast enlargement redemonstrated. No osseous lesion is evident. IMPRESSION: Extensive pulmonary metastatic disease with small left pleural effusion and worsening left lung base ventilation since the February CT. Electronically Signed  By: Genevie Ann M.D.   On: 10/17/2018 17:51   Ct Angio Chest Pe W And/or Wo Contrast  Result Date: 10/17/2018 CLINICAL DATA:  Cough and shortness of breath. Recently diagnosed breast cancer. EXAM: CT ANGIOGRAPHY CHEST WITH CONTRAST TECHNIQUE: Multidetector CT imaging of the chest was performed using the standard protocol during bolus administration of intravenous contrast. Multiplanar CT image reconstructions and MIPs were obtained to evaluate the vascular anatomy. CONTRAST:  155mL OMNIPAQUE IOHEXOL 350 MG/ML SOLN COMPARISON:  09/30/2018 FINDINGS: Cardiovascular: No lobar or more central pulmonary embolus is identified. Segmental assessment is limited by motion artifact and patient body habitus. The heart is normal in size. There is no pericardial effusion. The thoracic aorta  is normal in size. Mediastinum/Nodes: Again partially visualized are multiple left breast masses with left greater than right breast skin thickening. A 6 x 6 cm mass invading the left pectoralis major muscle is unchanged, and there is a similar appearance of diffuse regional edema. Multiple enlarged left axillary and subpectoral lymph nodes are again partially visualized measuring up to approximately 4 cm in long axis. Partially visualized left supraclavicular lymph nodes are similar to the prior CT. Partially visualized right axillary lymph nodes appear mildly larger than on the prior CT and measure up to 1.5 cm in short axis. Enlarged mediastinal lymph nodes have not significantly changed with the largest measuring 1.9 cm in short axis in the pre-vascular region. Lungs/Pleura: A moderate-sized left pleural effusion has enlarged from the prior CT with increasing atelectasis in the left lower lobe and to a lesser extent lingula. There is the suggestion of mild underlying pleural thickening, however a discrete pleural mass is not identified. Numerous bilateral lung nodules are again seen with multiple demonstrating a mild interval increase in size including a 3.3 x 3.1 cm left upper lobe mass (series 6, image 48, previously 3.1 x 3.0 cm) and a 2.0 cm right upper lobe nodule (series 6, image 50, previously 1.7 cm). Motion artifact and the enlarging left pleural effusion and atelectasis obscure some nodules. Upper Abdomen: Subcentimeter hypodensity in the hepatic dome on the prior CT is obscured by motion artifact on today's study. Musculoskeletal: No acute osseous abnormality or suspicious osseous lesion. Review of the MIP images confirms the above findings. IMPRESSION: 1. No evidence of lobar or more central pulmonary embolus. Nondiagnostic segmental assessment. 2. Moderate left pleural effusion, increased in size from the prior CT with increasing left lung atelectasis. 3. Unchanged appearance of multiple left breast  masses including an invasive mass involving the pectoralis major. 4. Unchanged left axillary, left subpectoral, and mediastinal lymphadenopathy. 5. Mild interval enlargement of multiple lung metastases and of right axillary lymph nodes. Electronically Signed   By: Logan Bores M.D.   On: 10/17/2018 20:05   Mr Jeri Cos XB Contrast  Result Date: 10/19/2018 CLINICAL DATA:  43 y/o F; Breast cancer, initial staging, clinical stage IV. EXAM: MRI HEAD WITHOUT AND WITH CONTRAST TECHNIQUE: Multiplanar, multiecho pulse sequences of the brain and surrounding structures were obtained without and with intravenous contrast. CONTRAST:  10 cc Gadavist. COMPARISON:  None. FINDINGS: Brain: 4 mm focus of enhancement within the right frontal white matter (series 23, image 37 and series 17, image 17 and series 5, image 85). No additional abnormal enhancement in the brain is identified. No findings of acute stroke, hemorrhage, extra-axial collection, hydrocephalus, or herniation. No additional structural or signal abnormality of the brain is evident. Vascular: Normal flow voids. Skull and upper cervical spine: Normal marrow signal.  Sinuses/Orbits: Negative. Other: None. IMPRESSION: Single 4 mm focus of enhancement within right frontal white matter probably representing metastatic disease. Attention at follow-up is recommended. Electronically Signed   By: Kristine Garbe M.D.   On: 10/19/2018 02:43   Ct Abdomen Pelvis W Contrast  Result Date: 10/19/2018 CLINICAL DATA:  Staging EXAM: CT ABDOMEN AND PELVIS WITH CONTRAST TECHNIQUE: Multidetector CT imaging of the abdomen and pelvis was performed using the standard protocol following bolus administration of intravenous contrast. CONTRAST:  153mL OMNIPAQUE IOHEXOL 300 MG/ML  SOLN COMPARISON:  CT chest, 10/17/2018 FINDINGS: Lower chest: Left pleural effusion associated atelectasis consolidation with numerous pulmonary masses and nodules on partially imaged. Skin thickening of  the left breast. Hepatobiliary: No focal liver abnormality is seen. No gallstones, gallbladder wall thickening, or biliary dilatation. Pancreas: Unremarkable. No pancreatic ductal dilatation or surrounding inflammatory changes. Spleen: Normal in size without focal abnormality. Adrenals/Urinary Tract: Adrenal glands are unremarkable. Kidneys are normal, without renal calculi, focal lesion, or hydronephrosis. Bladder is unremarkable. Stomach/Bowel: Stomach is within normal limits. Appendix appears normal. No evidence of bowel wall thickening, distention, or inflammatory changes. Vascular/Lymphatic: No significant vascular findings are present. No enlarged abdominal or pelvic lymph nodes. Reproductive: No mass or other abnormality. Other: No abdominal wall hernia or abnormality. No abdominopelvic ascites. Musculoskeletal: No acute or significant osseous findings. IMPRESSION: 1. No CT evidence of metastatic disease involving the abdomen or pelvis. 2. Left pleural effusion associated atelectasis consolidation with numerous pulmonary masses and nodules on partially imaged. Skin thickening of the left breast. Findings are in keeping with advanced breast malignancy and as seen on recent CT of the chest, 10/17/2018. Electronically Signed   By: Eddie Candle M.D.   On: 10/19/2018 14:09   US Thoracentesis Asp Pleural Space W/img Guide  Result Date: 10/18/2018 INDICATION: Patient with history of presumed stage IV metastatic breast cancer with dyspnea and left pleural effusion; request made for diagnostic and therapeutic left thoracentesis. EXAM: ULTRASOUND GUIDED DIAGNOSTIC AND THERAPEUTIC LEFT THORACENTESIS MEDICATIONS: None COMPLICATIONS: None immediate. PROCEDURE: An ultrasound guided thoracentesis was thoroughly discussed with the patient and questions answered. The benefits, risks, alternatives and complications were also discussed. The patient understands and wishes to proceed with the procedure. Written consent was  obtained. Ultrasound was performed to localize and mark an adequate pocket of fluid in the left chest. The area was then prepped and draped in the normal sterile fashion. 1% Lidocaine was used for local anesthesia. Under ultrasound guidance a 6 Fr Safe-T-Centesis catheter was introduced. Thoracentesis was performed. The catheter was removed and a dressing applied. FINDINGS: A total of approximately 1.2 liters of hazy, yellow fluid was removed. Samples were sent to the laboratory as requested by the clinical team. IMPRESSION: Successful ultrasound guided diagnostic and therapeutic left thoracentesis yielding 1.2 liters of pleural fluid. Read by: Rowe Robert, PA-C Electronically Signed   By: Markus Daft M.D.   On: 10/18/2018 12:03        Scheduled Meds:  sodium chloride (PF)       Continuous Infusions:   LOS: 1 day    Time spent: 36 minutes.     Hosie Poisson, MD [facility] Pager [phone]  If 7PM-7AM, please contact night-coverage [email or web address] Password [facility] 10/19/2018, 4:45 PM

## 2018-10-19 NOTE — Progress Notes (Signed)
[facility] INPATIENT PROGRESS NOTE  SUBJECTIVE: Sharon Floyd 43 y.o. female with suspected stage IV metastatic breast cancer.  Biopsy and additional staging are pending.  The patient was admitted to the hospital for hypoxia and dyspnea.  CT angiogram the chest performed in the emergency room showed no evidence of pulmonary embolism.  There was a moderate left pleural effusion which was increased in size, unchanged pulmonary metastasis and left axillary, subpectoral, and mediastinal lymphadenopathy.  There is mild interval enlargement of the multiple lung metastases and right axillary lymph nodes.  The patient underwent a thoracentesis on 10/18/2018.  1.2 L of hazy, yellow fluid was removed.  Fluid has been sent for pleural fluid analysis.  She also had an MRI of the brain performed yesterday for initial staging.  This showed a single 4 mm focus of enhancement within the right frontal white matter likely representing metastatic disease.  CT of the abdomen and pelvis, lymph node biopsy, and Port-A-Cath placement are pending today.  An echocardiogram was attempted yesterday but could not be completed due to significant swelling over her left chest.  Echocardiogram is being reattempted this morning and although a complete echocardiogram was unable to be completed, and ejection fraction should be available to [place].  The patient reports her breathing is better today as the thoracentesis.  Denies fevers and chills.  Denies chest pain.  Denies nausea, vomiting, constipation, diarrhea.  No bleeding.  ALLERGIES:  is allergic to aspirin.  MEDICATIONS:  Current Facility-Administered Medications  Medication Dose Route Frequency Provider Last Rate Last Dose  . acetaminophen (TYLENOL) tablet 650 mg  650 mg Oral Q6H PRN Shela Leff, MD   650 mg at 10/18/18 1334   Or  . acetaminophen (TYLENOL) suppository 650 mg  650 mg Rectal Q6H PRN Shela Leff, MD      . ceFAZolin (ANCEF) 3 g in  dextrose 5 % 50 mL IVPB  3 g Intravenous to XRAY Allred, Darrell K, PA-C      . HYDROcodone-acetaminophen (NORCO/VICODIN) 5-325 MG per tablet 1-2 tablet  1-2 tablet Oral Q6H PRN Shela Leff, MD   2 tablet at 10/18/18 1932  . iohexol (OMNIPAQUE) 300 MG/ML solution 15 mL  15 mL Oral Once PRN Hosie Poisson, MD      . morphine 2 MG/ML injection 1-2 mg  1-2 mg Intravenous Q4H PRN Hosie Poisson, MD   2 mg at 10/19/18 0039  . perflutren lipid microspheres (DEFINITY) IV suspension  1-10 mL Intravenous PRN Maryanna Shape, NP   2 mL at 10/19/18 0909    REVIEW OF SYSTEMS:    Review of Systems  Constitutional: Positive for malaise/fatigue. Negative for chills and fever.  HENT: Negative.   Eyes: Negative.   Respiratory: Positive for cough and shortness of breath. Negative for hemoptysis and sputum production.        Shortness of breath has improved since thoracentesis.  Cardiovascular: Negative.   Gastrointestinal: Negative.   Genitourinary: Negative.   Musculoskeletal: Negative.   Skin: Negative for rash.  Neurological: Negative.   Endo/Heme/Allergies: Negative.   Psychiatric/Behavioral: Negative.       PHYSICAL EXAMINATION:  Vital signs in last 24 hours: Temp:  [98.6 F (37 C)-99.9 F (37.7 C)] 99.2 F (37.3 C) (03/12 0500) Pulse Rate:  [91-101] 97 (03/12 0500) Resp:  [20-30] 22 (03/12 0500) BP: (119-133)/(65-89) 130/89 (03/12 0500) SpO2:  [94 %-97 %] 95 % (03/12 0500) Weight change:  Last BM Date: 10/18/18  ECOG PERFORMANCE STATUS: 1 - Symptomatic  but completely ambulatory  Intake/Output from previous day: No intake/output data recorded. General: Alert, awake without distress. Head: Normocephalic atraumatic. Mouth: mucous membranes moist, pharynx normal without lesions Eyes: No scleral icterus.  Pupils are equal and round reactive to light. Lymph: Palpable approximately 1.5 cm left supraclavicular lymph node and left axillary adenopathy Resp: Diminished breath sounds to  the left lung base.  Clear on the right. Cardio: regular rate and rhythm, S1, S2 normal, no murmur, click, rub or gallop GI: soft, non-tender; bowel sounds normal; no masses,  no organomegaly Musculoskeletal: No joint deformity or effusion. Neurological: No motor, sensory deficits.  Intact deep tendon reflexes. Skin: No rashes or lesions. Breasts: A large, firm left breast with extensive peau d'orange changes in the entire breast, right breast unremarkable   LABORATORY DATA: Lab Results  Component Value Date   WBC 7.0 10/19/2018   HGB 11.8 (L) 10/19/2018   HCT 38.3 10/19/2018   MCV 94.1 10/19/2018   PLT 422 (H) 10/19/2018   CMP Latest Ref Rng & Units 10/17/2018 10/12/2018  Glucose 70 - 99 mg/dL 89 111(H)  BUN 6 - 20 mg/dL 11 11  Creatinine 0.44 - 1.00 mg/dL 0.90 0.99  Sodium 135 - 145 mmol/L 139 139  Potassium 3.5 - 5.1 mmol/L 4.2 4.2  Chloride 98 - 111 mmol/L 106 103  CO2 22 - 32 mmol/L 25 27  Calcium 8.9 - 10.3 mg/dL 9.0 9.8  Total Protein 6.5 - 8.1 g/dL 7.9 7.7  Total Bilirubin 0.3 - 1.2 mg/dL 0.8 0.5  Alkaline Phos 38 - 126 U/L 70 65  AST 15 - 41 U/L 42(H) 20  ALT 0 - 44 U/L 29 19    RADIOGRAPHIC STUDIES:  Dg Chest 1 View  Result Date: 10/18/2018 CLINICAL DATA:  Status post left-sided thoracentesis EXAM: CHEST  1 VIEW COMPARISON:  10/17/2018 FINDINGS: Cardiac shadows within normal limits. Significant reduction in left pleural effusion is noted following thoracentesis. No pneumothorax is noted. Minimal residual fluid remains. Multiple pulmonary metastatic lesions are noted. No acute bony abnormality is seen. IMPRESSION: Stable pulmonary metastatic disease. Improving left-sided effusion following thoracentesis. No pneumothorax is noted. Electronically Signed   By: Inez Catalina M.D.   On: 10/18/2018 11:51   Dg Chest 2 View  Result Date: 10/17/2018 CLINICAL DATA:  43 year old female with breast cancer and shortness of breath requiring rapid response. EXAM: CHEST - 2 VIEW  COMPARISON:  [facility] chest CT 09/30/2018. FINDINGS: Numerous rounded bilateral pulmonary metastases redemonstrated with superimposed small left pleural effusion. Mildly decreased left lung volume and decreased left lung ventilation compared to the February CT. No superimposed pneumothorax or pulmonary edema. Stable cardiac size and mediastinal contours. Visualized tracheal air column is within normal limits. Negative visible bowel gas pattern. Asymmetric left breast enlargement redemonstrated. No osseous lesion is evident. IMPRESSION: Extensive pulmonary metastatic disease with small left pleural effusion and worsening left lung base ventilation since the February CT. Electronically Signed   By: Genevie Ann M.D.   On: 10/17/2018 17:51   Ct Angio Chest Pe W And/or Wo Contrast  Result Date: 10/17/2018 CLINICAL DATA:  Cough and shortness of breath. Recently diagnosed breast cancer. EXAM: CT ANGIOGRAPHY CHEST WITH CONTRAST TECHNIQUE: Multidetector CT imaging of the chest was performed using the standard protocol during bolus administration of intravenous contrast. Multiplanar CT image reconstructions and MIPs were obtained to evaluate the vascular anatomy. CONTRAST:  132mL OMNIPAQUE IOHEXOL 350 MG/ML SOLN COMPARISON:  09/30/2018 FINDINGS: Cardiovascular: No lobar or more central pulmonary embolus  is identified. Segmental assessment is limited by motion artifact and patient body habitus. The heart is normal in size. There is no pericardial effusion. The thoracic aorta is normal in size. Mediastinum/Nodes: Again partially visualized are multiple left breast masses with left greater than right breast skin thickening. A 6 x 6 cm mass invading the left pectoralis major muscle is unchanged, and there is a similar appearance of diffuse regional edema. Multiple enlarged left axillary and subpectoral lymph nodes are again partially visualized measuring up to approximately 4 cm in long axis. Partially visualized  left supraclavicular lymph nodes are similar to the prior CT. Partially visualized right axillary lymph nodes appear mildly larger than on the prior CT and measure up to 1.5 cm in short axis. Enlarged mediastinal lymph nodes have not significantly changed with the largest measuring 1.9 cm in short axis in the pre-vascular region. Lungs/Pleura: A moderate-sized left pleural effusion has enlarged from the prior CT with increasing atelectasis in the left lower lobe and to a lesser extent lingula. There is the suggestion of mild underlying pleural thickening, however a discrete pleural mass is not identified. Numerous bilateral lung nodules are again seen with multiple demonstrating a mild interval increase in size including a 3.3 x 3.1 cm left upper lobe mass (series 6, image 48, previously 3.1 x 3.0 cm) and a 2.0 cm right upper lobe nodule (series 6, image 50, previously 1.7 cm). Motion artifact and the enlarging left pleural effusion and atelectasis obscure some nodules. Upper Abdomen: Subcentimeter hypodensity in the hepatic dome on the prior CT is obscured by motion artifact on today's study. Musculoskeletal: No acute osseous abnormality or suspicious osseous lesion. Review of the MIP images confirms the above findings. IMPRESSION: 1. No evidence of lobar or more central pulmonary embolus. Nondiagnostic segmental assessment. 2. Moderate left pleural effusion, increased in size from the prior CT with increasing left lung atelectasis. 3. Unchanged appearance of multiple left breast masses including an invasive mass involving the pectoralis major. 4. Unchanged left axillary, left subpectoral, and mediastinal lymphadenopathy. 5. Mild interval enlargement of multiple lung metastases and of right axillary lymph nodes. Electronically Signed   By: Logan Bores M.D.   On: 10/17/2018 20:05   Mr Jeri Cos XA Contrast  Result Date: 10/19/2018 CLINICAL DATA:  43 y/o F; Breast cancer, initial staging, clinical stage IV. EXAM:  MRI HEAD WITHOUT AND WITH CONTRAST TECHNIQUE: Multiplanar, multiecho pulse sequences of the brain and surrounding structures were obtained without and with intravenous contrast. CONTRAST:  10 cc Gadavist. COMPARISON:  None. FINDINGS: Brain: 4 mm focus of enhancement within the right frontal white matter (series 23, image 37 and series 17, image 17 and series 5, image 85). No additional abnormal enhancement in the brain is identified. No findings of acute stroke, hemorrhage, extra-axial collection, hydrocephalus, or herniation. No additional structural or signal abnormality of the brain is evident. Vascular: Normal flow voids. Skull and upper cervical spine: Normal marrow signal. Sinuses/Orbits: Negative. Other: None. IMPRESSION: Single 4 mm focus of enhancement within right frontal white matter probably representing metastatic disease. Attention at follow-up is recommended. Electronically Signed   By: Kristine Garbe M.D.   On: 10/19/2018 02:43   US Thoracentesis Asp Pleural Space W/img Guide  Result Date: 10/18/2018 INDICATION: Patient with history of presumed stage IV metastatic breast cancer with dyspnea and left pleural effusion; request made for diagnostic and therapeutic left thoracentesis. EXAM: ULTRASOUND GUIDED DIAGNOSTIC AND THERAPEUTIC LEFT THORACENTESIS MEDICATIONS: None COMPLICATIONS: None immediate. PROCEDURE: An ultrasound guided  thoracentesis was thoroughly discussed with the patient and questions answered. The benefits, risks, alternatives and complications were also discussed. The patient understands and wishes to proceed with the procedure. Written consent was obtained. Ultrasound was performed to localize and mark an adequate pocket of fluid in the left chest. The area was then prepped and draped in the normal sterile fashion. 1% Lidocaine was used for local anesthesia. Under ultrasound guidance a 6 Fr Safe-T-Centesis catheter was introduced. Thoracentesis was performed. The catheter  was removed and a dressing applied. FINDINGS: A total of approximately 1.2 liters of hazy, yellow fluid was removed. Samples were sent to the laboratory as requested by the clinical team. IMPRESSION: Successful ultrasound guided diagnostic and therapeutic left thoracentesis yielding 1.2 liters of pleural fluid. Read by: Rowe Robert, PA-C Electronically Signed   By: Markus Daft M.D.   On: 10/18/2018 12:03     ASSESSMENT/PLAN:  1.  Suspected metastatic breast cancer to the lungs.  The patient presented with a several week history of cough and skin changes to her left breast.  She has now been admitted to the hospital with worsening shortness of breath and hypoxia. -Thoracentesis placed on 10/18/2018.  Breathing has improved.  Fluid has been sent for pleural fluid analysis. -Axillary lymph node biopsy by interventional radiology today. -Port-A-Cath will be placed for pending chemotherapy by interventional radiology later today. -A CT of the abdomen and pelvis has been ordered for staging purposes. -An MRI of the brain showed a small 4 mm focus of enhancement.  Radiation oncology has been notified will evaluate the patient for possible SRS.  This result has been discussed with the patient.  She has no vasogenic edema so dexamethasone will not be started. -Echocardiogram was reattempted this morning and likely successful in measuring ejection fraction.  Waiting results. -Above plan was discussed with the hospitalist.   LOS: 1 day   Mikey Bussing, DNP, AGPCNP-BC, AOCNP 10/19/18

## 2018-10-19 NOTE — Progress Notes (Signed)
MEDICATION-RELATED CONSULT NOTE   IR Procedure Consult - Anticoagulant/Antiplatelet PTA/Inpatient Med List Review by Pharmacist    Procedure: IJ placement and left supraclavicular LN biopsy    Completed: 1614  Post-Procedural bleeding risk per IR MD assessment: low  Antithrombotic medications on inpatient or PTA profile prior to procedure:   [place] UFH    Recommended restart time per IR Post-Procedure Guidelines:  4 hours post-procedure   Other considerations:      Plan:     Will resume  UFH 5000 units q 8h at 2200.  Ulice Dash, PharmD Clinical Pharmacist Pager # [phone]

## 2018-10-19 NOTE — Progress Notes (Signed)
Notified by tele that patient was not on montior. Day shift RN failed to put patient back on tele after biopsy procedure. Patient is now back on tele. Will continue to monitor.

## 2018-10-19 NOTE — Telephone Encounter (Signed)
There was a duplicate encounter that was also open in regards to same info from [place] at [facility] and all as able to get all taken care of for pt. Nothing further needed.

## 2018-10-20 ENCOUNTER — Ambulatory Visit: Payer: BLUE CROSS/BLUE SHIELD

## 2018-10-20 ENCOUNTER — Inpatient Hospital Stay (HOSPITAL_COMMUNITY): Payer: BLUE CROSS/BLUE SHIELD

## 2018-10-20 DIAGNOSIS — J9601 Acute respiratory failure with hypoxia: Secondary | ICD-10-CM

## 2018-10-20 LAB — CBC
HCT: 40.7 % (ref 36.0–46.0)
Hemoglobin: 12.2 g/dL (ref 12.0–15.0)
MCH: 28.6 pg (ref 26.0–34.0)
MCHC: 30 g/dL (ref 30.0–36.0)
MCV: 95.3 fL (ref 80.0–100.0)
Platelets: 472 10*3/uL — ABNORMAL HIGH (ref 150–400)
RBC: 4.27 MIL/uL (ref 3.87–5.11)
RDW: 12.7 % (ref 11.5–15.5)
WBC: 7.7 10*3/uL (ref 4.0–10.5)
nRBC: 0 % (ref 0.0–0.2)

## 2018-10-20 LAB — BASIC METABOLIC PANEL
Anion gap: 10 (ref 5–15)
BUN: 9 mg/dL (ref 6–20)
CHLORIDE: 102 mmol/L (ref 98–111)
CO2: 26 mmol/L (ref 22–32)
Calcium: 8.8 mg/dL — ABNORMAL LOW (ref 8.9–10.3)
Creatinine, Ser: 0.84 mg/dL (ref 0.44–1.00)
GFR calc Af Amer: >60 mL/min (ref >60)
GFR calc non Af Amer: >60 mL/min (ref >60)
Glucose, Bld: 97 mg/dL (ref 70–99)
Potassium: 4.1 mmol/L (ref 3.5–5.1)
Sodium: 138 mmol/L (ref 135–145)

## 2018-10-20 NOTE — Progress Notes (Signed)
[place] INPATIENT PROGRESS NOTE  SUBJECTIVE: Sharon Floyd 43 y.o. female with suspected stage IV metastatic breast cancer.  Biopsy and additional staging are pending.  The patient was admitted to the hospital for hypoxia and dyspnea.  CT angiogram the chest performed in the emergency room showed no evidence of pulmonary embolism.  There was a moderate left pleural effusion which was increased in size, unchanged pulmonary metastasis and left axillary, subpectoral, and mediastinal lymphadenopathy.  There is mild interval enlargement of the multiple lung metastases and right axillary lymph nodes.  The patient underwent a thoracentesis on 10/18/2018.  1.2 L of hazy, yellow fluid was removed.  Fluid has been sent for pleural fluid analysis. Cytology pending.  She also had an MRI of the brain performed yesterday for initial staging.  This showed a single 4 mm focus of enhancement within the right frontal white matter likely representing metastatic disease.  Radiation oncology is aware of these results and is in the process of scheduling the patient for [facility].  CT of the abdomen pelvis did not show any evidence of metastatic disease.  A Port-A-Cath was placed by interventional radiology on 10/19/2018.  She also underwent a lymph node biopsy. Pathology pending.  An echocardiogram was performed which showed ejection fraction greater than 65%.  The patient reports that her breathing is stable.  Still has an intermittent nonproductive cough.  Denies fevers and chills.  Denies chest pain.  Denies nausea, vomiting, constipation, diarrhea.  No bleeding.  ALLERGIES:  is allergic to aspirin.  MEDICATIONS:  Current Facility-Administered Medications  Medication Dose Route Frequency Provider Last Rate Last Dose  . acetaminophen (TYLENOL) tablet 650 mg  650 mg Oral Q6H PRN Shela Leff, MD   650 mg at 10/18/18 1334   Or  . acetaminophen (TYLENOL) suppository 650 mg  650 mg Rectal Q6H PRN Shela Leff, MD      . heparin injection 5,000 Units  5,000 Units Subcutaneous Q8H Jacqulynn Cadet, MD      . iohexol (OMNIPAQUE) 300 MG/ML solution 15 mL  15 mL Oral Once PRN Hosie Poisson, MD      . morphine 2 MG/ML injection 1-2 mg  1-2 mg Intravenous Q4H PRN Hosie Poisson, MD      . oxyCODONE (Oxy IR/ROXICODONE) immediate release tablet 5-10 mg  5-10 mg Oral Q4H PRN Hosie Poisson, MD   10 mg at 10/20/18 5697    REVIEW OF SYSTEMS:    Review of Systems  Constitutional: Positive for malaise/fatigue. Negative for chills and fever.  HENT: Negative.   Eyes: Negative.   Respiratory: Positive for cough and shortness of breath. Negative for hemoptysis and sputum production.        Shortness of breath has improved since thoracentesis.  Cardiovascular: Negative.   Gastrointestinal: Negative.   Genitourinary: Negative.   Musculoskeletal: Negative.   Skin: Negative for rash.  Neurological: Negative.   Endo/Heme/Allergies: Negative.   Psychiatric/Behavioral: Negative.       PHYSICAL EXAMINATION:  Vital signs in last 24 hours: Temp:  [98.3 F (36.8 C)-98.9 F (37.2 C)] 98.8 F (37.1 C) (03/13 0553) Pulse Rate:  [84-103] 103 (03/13 0553) Resp:  [11-20] 20 (03/13 0553) BP: (100-135)/(69-102) 128/93 (03/13 0553) SpO2:  [92 %-99 %] 97 % (03/13 0553) Weight change:  Last BM Date: 10/18/18  ECOG PERFORMANCE STATUS: 1 - Symptomatic but completely ambulatory  Intake/Output from previous day: No intake/output data recorded. General: Alert, awake without distress. Head: Normocephalic atraumatic. Mouth: mucous membranes moist, pharynx  normal without lesions Eyes: No scleral icterus.  Pupils are equal and round reactive to light. Lymph: Palpable approximately 1.5 cm left supraclavicular lymph node and left axillary adenopathy Resp: Diminished breath sounds to the left lung base.  Clear on the right. Cardio: regular rate and rhythm, S1, S2 normal, no murmur, click, rub or gallop GI: soft,  non-tender; bowel sounds normal; no masses,  no organomegaly Musculoskeletal: No joint deformity or effusion. Neurological: No motor, sensory deficits.  Intact deep tendon reflexes. Skin: No rashes or lesions. Breasts: A large, firm left breast with extensive peau d'orange changes in the entire breast, right breast unremarkable   LABORATORY DATA: Lab Results  Component Value Date   WBC 7.7 10/20/2018   HGB 12.2 10/20/2018   HCT 40.7 10/20/2018   MCV 95.3 10/20/2018   PLT 472 (H) 10/20/2018   CMP Latest Ref Rng & Units 10/20/2018 10/17/2018 10/12/2018  Glucose 70 - 99 mg/dL 97 89 111(H)  BUN 6 - 20 mg/dL 9 11 11   Creatinine 0.44 - 1.00 mg/dL 0.84 0.90 0.99  Sodium 135 - 145 mmol/L 138 139 139  Potassium 3.5 - 5.1 mmol/L 4.1 4.2 4.2  Chloride 98 - 111 mmol/L 102 106 103  CO2 22 - 32 mmol/L 26 25 27   Calcium 8.9 - 10.3 mg/dL 8.8(L) 9.0 9.8  Total Protein 6.5 - 8.1 g/dL - 7.9 7.7  Total Bilirubin 0.3 - 1.2 mg/dL - 0.8 0.5  Alkaline Phos 38 - 126 U/L - 70 65  AST 15 - 41 U/L - 42(H) 20  ALT 0 - 44 U/L - 29 19    RADIOGRAPHIC STUDIES:  Dg Chest 1 View  Result Date: 10/18/2018 CLINICAL DATA:  Status post left-sided thoracentesis EXAM: CHEST  1 VIEW COMPARISON:  10/17/2018 FINDINGS: Cardiac shadows within normal limits. Significant reduction in left pleural effusion is noted following thoracentesis. No pneumothorax is noted. Minimal residual fluid remains. Multiple pulmonary metastatic lesions are noted. No acute bony abnormality is seen. IMPRESSION: Stable pulmonary metastatic disease. Improving left-sided effusion following thoracentesis. No pneumothorax is noted. Electronically Signed   By: Inez Catalina M.D.   On: 10/18/2018 11:51   Dg Chest 2 View  Result Date: 10/17/2018 CLINICAL DATA:  43 year old female with breast cancer and shortness of breath requiring rapid response. EXAM: CHEST - 2 VIEW COMPARISON:  [facility] chest CT 09/30/2018. FINDINGS: Numerous rounded  bilateral pulmonary metastases redemonstrated with superimposed small left pleural effusion. Mildly decreased left lung volume and decreased left lung ventilation compared to the February CT. No superimposed pneumothorax or pulmonary edema. Stable cardiac size and mediastinal contours. Visualized tracheal air column is within normal limits. Negative visible bowel gas pattern. Asymmetric left breast enlargement redemonstrated. No osseous lesion is evident. IMPRESSION: Extensive pulmonary metastatic disease with small left pleural effusion and worsening left lung base ventilation since the February CT. Electronically Signed   By: Genevie Ann M.D.   On: 10/17/2018 17:51   Ct Angio Chest Pe W And/or Wo Contrast  Result Date: 10/17/2018 CLINICAL DATA:  Cough and shortness of breath. Recently diagnosed breast cancer. EXAM: CT ANGIOGRAPHY CHEST WITH CONTRAST TECHNIQUE: Multidetector CT imaging of the chest was performed using the standard protocol during bolus administration of intravenous contrast. Multiplanar CT image reconstructions and MIPs were obtained to evaluate the vascular anatomy. CONTRAST:  16mL OMNIPAQUE IOHEXOL 350 MG/ML SOLN COMPARISON:  09/30/2018 FINDINGS: Cardiovascular: No lobar or more central pulmonary embolus is identified. Segmental assessment is limited by motion artifact and patient body  habitus. The heart is normal in size. There is no pericardial effusion. The thoracic aorta is normal in size. Mediastinum/Nodes: Again partially visualized are multiple left breast masses with left greater than right breast skin thickening. A 6 x 6 cm mass invading the left pectoralis major muscle is unchanged, and there is a similar appearance of diffuse regional edema. Multiple enlarged left axillary and subpectoral lymph nodes are again partially visualized measuring up to approximately 4 cm in long axis. Partially visualized left supraclavicular lymph nodes are similar to the prior CT. Partially visualized  right axillary lymph nodes appear mildly larger than on the prior CT and measure up to 1.5 cm in short axis. Enlarged mediastinal lymph nodes have not significantly changed with the largest measuring 1.9 cm in short axis in the pre-vascular region. Lungs/Pleura: A moderate-sized left pleural effusion has enlarged from the prior CT with increasing atelectasis in the left lower lobe and to a lesser extent lingula. There is the suggestion of mild underlying pleural thickening, however a discrete pleural mass is not identified. Numerous bilateral lung nodules are again seen with multiple demonstrating a mild interval increase in size including a 3.3 x 3.1 cm left upper lobe mass (series 6, image 48, previously 3.1 x 3.0 cm) and a 2.0 cm right upper lobe nodule (series 6, image 50, previously 1.7 cm). Motion artifact and the enlarging left pleural effusion and atelectasis obscure some nodules. Upper Abdomen: Subcentimeter hypodensity in the hepatic dome on the prior CT is obscured by motion artifact on today's study. Musculoskeletal: No acute osseous abnormality or suspicious osseous lesion. Review of the MIP images confirms the above findings. IMPRESSION: 1. No evidence of lobar or more central pulmonary embolus. Nondiagnostic segmental assessment. 2. Moderate left pleural effusion, increased in size from the prior CT with increasing left lung atelectasis. 3. Unchanged appearance of multiple left breast masses including an invasive mass involving the pectoralis major. 4. Unchanged left axillary, left subpectoral, and mediastinal lymphadenopathy. 5. Mild interval enlargement of multiple lung metastases and of right axillary lymph nodes. Electronically Signed   By: Logan Bores M.D.   On: 10/17/2018 20:05   Mr Jeri Cos TK Contrast  Result Date: 10/19/2018 CLINICAL DATA:  43 y/o F; Breast cancer, initial staging, clinical stage IV. EXAM: MRI HEAD WITHOUT AND WITH CONTRAST TECHNIQUE: Multiplanar, multiecho pulse  sequences of the brain and surrounding structures were obtained without and with intravenous contrast. CONTRAST:  10 cc Gadavist. COMPARISON:  None. FINDINGS: Brain: 4 mm focus of enhancement within the right frontal white matter (series 23, image 37 and series 17, image 17 and series 5, image 85). No additional abnormal enhancement in the brain is identified. No findings of acute stroke, hemorrhage, extra-axial collection, hydrocephalus, or herniation. No additional structural or signal abnormality of the brain is evident. Vascular: Normal flow voids. Skull and upper cervical spine: Normal marrow signal. Sinuses/Orbits: Negative. Other: None. IMPRESSION: Single 4 mm focus of enhancement within right frontal white matter probably representing metastatic disease. Attention at follow-up is recommended. Electronically Signed   By: Kristine Garbe M.D.   On: 10/19/2018 02:43   Ct Abdomen Pelvis W Contrast  Result Date: 10/19/2018 CLINICAL DATA:  Staging EXAM: CT ABDOMEN AND PELVIS WITH CONTRAST TECHNIQUE: Multidetector CT imaging of the abdomen and pelvis was performed using the standard protocol following bolus administration of intravenous contrast. CONTRAST:  131mL OMNIPAQUE IOHEXOL 300 MG/ML  SOLN COMPARISON:  CT chest, 10/17/2018 FINDINGS: Lower chest: Left pleural effusion associated atelectasis consolidation with  numerous pulmonary masses and nodules on partially imaged. Skin thickening of the left breast. Hepatobiliary: No focal liver abnormality is seen. No gallstones, gallbladder wall thickening, or biliary dilatation. Pancreas: Unremarkable. No pancreatic ductal dilatation or surrounding inflammatory changes. Spleen: Normal in size without focal abnormality. Adrenals/Urinary Tract: Adrenal glands are unremarkable. Kidneys are normal, without renal calculi, focal lesion, or hydronephrosis. Bladder is unremarkable. Stomach/Bowel: Stomach is within normal limits. Appendix appears normal. No evidence  of bowel wall thickening, distention, or inflammatory changes. Vascular/Lymphatic: No significant vascular findings are present. No enlarged abdominal or pelvic lymph nodes. Reproductive: No mass or other abnormality. Other: No abdominal wall hernia or abnormality. No abdominopelvic ascites. Musculoskeletal: No acute or significant osseous findings. IMPRESSION: 1. No CT evidence of metastatic disease involving the abdomen or pelvis. 2. Left pleural effusion associated atelectasis consolidation with numerous pulmonary masses and nodules on partially imaged. Skin thickening of the left breast. Findings are in keeping with advanced breast malignancy and as seen on recent CT of the chest, 10/17/2018. Electronically Signed   By: Eddie Candle M.D.   On: 10/19/2018 14:09   Ir US Guide Bx Asp/drain  Addendum Date: 10/19/2018   ADDENDUM REPORT: 10/19/2018 16:58 ADDENDUM: Concurrently performed procedures were inadvertently linked. Following successful placement of the port catheter, attention was turned to the left supraclavicular region which was sterilely prepped and draped in the standard fashion using chlorhexidine skin prep. Ultrasound was used to interrogate the neck. A large supraclavicular lymph node was successfully identified. A suitable skin entry site was selected and marked. Local anesthesia was attained by infiltration with 1 percent lidocaine. A small dermatotomy was made. Under real-time sonographic guidance, multiple 18 gauge core biopsies were obtained of lymph node using the Bard mission biopsy device. Biopsy specimens were placed in formalin and delivered to pathology for further analysis. Post biopsy ultrasound imaging demonstrates no evidence of complication. IMPRESSION: Technically successful ultrasound-guided core biopsy of left supraclavicular lymph node. Electronically Signed   By: Jacqulynn Cadet M.D.   On: 10/19/2018 16:58   Result Date: 10/19/2018 INDICATION: 43 year old female with  advanced left breast cancer. She presents for port catheter placement to initiate chemotherapy. EXAM: IMPLANTED PORT A CATH PLACEMENT WITH ULTRASOUND AND FLUOROSCOPIC GUIDANCE MEDICATIONS: 3 g Ancef; The antibiotic was administered within an appropriate time interval prior to skin puncture. ANESTHESIA/SEDATION: Versed 1 mg IV; Fentanyl 75 mcg IV; Moderate Sedation Time:  22 minutes The patient was continuously monitored during the procedure by the interventional radiology nurse under my direct supervision. FLUOROSCOPY TIME:  0 minutes, 12 seconds (12 mGy) COMPLICATIONS: None immediate. PROCEDURE: The right neck and chest was prepped with chlorhexidine, and draped in the usual sterile fashion using maximum barrier technique (cap and mask, sterile gown, sterile gloves, large sterile sheet, hand hygiene and cutaneous antiseptic). Local anesthesia was attained by infiltration with 1% lidocaine with epinephrine. Ultrasound demonstrated patency of the right internal jugular vein, and this was documented with an image. Under real-time ultrasound guidance, this vein was accessed with a 21 gauge micropuncture needle and image documentation was performed. A small dermatotomy was made at the access site with an 11 scalpel. A 0.018" wire was advanced into the SVC and the access needle exchanged for a 69F micropuncture vascular sheath. The 0.018" wire was then removed and a 0.035" wire advanced into the IVC. An appropriate location for the subcutaneous reservoir was selected below the clavicle and an incision was made through the skin and underlying soft tissues. The subcutaneous tissues were then  dissected using a combination of blunt and sharp surgical technique and a pocket was formed. A single lumen power injectable portacatheter was then tunneled through the subcutaneous tissues from the pocket to the dermatotomy and the port reservoir placed within the subcutaneous pocket. The venous access site was then serially dilated  and a peel away vascular sheath placed over the wire. The wire was removed and the port catheter advanced into position under fluoroscopic guidance. The catheter tip is positioned in the superior cavoatrial junction. This was documented with a spot image. The portacatheter was then tested and found to flush and aspirate well. The port was flushed with saline followed by 100 units/mL heparinized saline. The pocket was then closed in two layers using first subdermal inverted interrupted absorbable sutures followed by a running subcuticular suture. The epidermis was then sealed with Dermabond. The dermatotomy at the venous access site was also closed with Dermabond. IMPRESSION: Successful placement of a right IJ approach Power Port with ultrasound and fluoroscopic guidance. The catheter is ready for use. Electronically Signed: By: Jacqulynn Cadet M.D. On: 10/19/2018 16:48   Ir Imaging Guided Port Insertion  Addendum Date: 10/19/2018   ADDENDUM REPORT: 10/19/2018 16:58 ADDENDUM: Concurrently performed procedures were inadvertently linked. Following successful placement of the port catheter, attention was turned to the left supraclavicular region which was sterilely prepped and draped in the standard fashion using chlorhexidine skin prep. Ultrasound was used to interrogate the neck. A large supraclavicular lymph node was successfully identified. A suitable skin entry site was selected and marked. Local anesthesia was attained by infiltration with 1 percent lidocaine. A small dermatotomy was made. Under real-time sonographic guidance, multiple 18 gauge core biopsies were obtained of lymph node using the Bard mission biopsy device. Biopsy specimens were placed in formalin and delivered to pathology for further analysis. Post biopsy ultrasound imaging demonstrates no evidence of complication. IMPRESSION: Technically successful ultrasound-guided core biopsy of left supraclavicular lymph node. Electronically Signed    By: Jacqulynn Cadet M.D.   On: 10/19/2018 16:58   Result Date: 10/19/2018 INDICATION: 43 year old female with advanced left breast cancer. She presents for port catheter placement to initiate chemotherapy. EXAM: IMPLANTED PORT A CATH PLACEMENT WITH ULTRASOUND AND FLUOROSCOPIC GUIDANCE MEDICATIONS: 3 g Ancef; The antibiotic was administered within an appropriate time interval prior to skin puncture. ANESTHESIA/SEDATION: Versed 1 mg IV; Fentanyl 75 mcg IV; Moderate Sedation Time:  22 minutes The patient was continuously monitored during the procedure by the interventional radiology nurse under my direct supervision. FLUOROSCOPY TIME:  0 minutes, 12 seconds (12 mGy) COMPLICATIONS: None immediate. PROCEDURE: The right neck and chest was prepped with chlorhexidine, and draped in the usual sterile fashion using maximum barrier technique (cap and mask, sterile gown, sterile gloves, large sterile sheet, hand hygiene and cutaneous antiseptic). Local anesthesia was attained by infiltration with 1% lidocaine with epinephrine. Ultrasound demonstrated patency of the right internal jugular vein, and this was documented with an image. Under real-time ultrasound guidance, this vein was accessed with a 21 gauge micropuncture needle and image documentation was performed. A small dermatotomy was made at the access site with an 11 scalpel. A 0.018" wire was advanced into the SVC and the access needle exchanged for a 22F micropuncture vascular sheath. The 0.018" wire was then removed and a 0.035" wire advanced into the IVC. An appropriate location for the subcutaneous reservoir was selected below the clavicle and an incision was made through the skin and underlying soft tissues. The subcutaneous tissues were then dissected  using a combination of blunt and sharp surgical technique and a pocket was formed. A single lumen power injectable portacatheter was then tunneled through the subcutaneous tissues from the pocket to the dermatotomy  and the port reservoir placed within the subcutaneous pocket. The venous access site was then serially dilated and a peel away vascular sheath placed over the wire. The wire was removed and the port catheter advanced into position under fluoroscopic guidance. The catheter tip is positioned in the superior cavoatrial junction. This was documented with a spot image. The portacatheter was then tested and found to flush and aspirate well. The port was flushed with saline followed by 100 units/mL heparinized saline. The pocket was then closed in two layers using first subdermal inverted interrupted absorbable sutures followed by a running subcuticular suture. The epidermis was then sealed with Dermabond. The dermatotomy at the venous access site was also closed with Dermabond. IMPRESSION: Successful placement of a right IJ approach Power Port with ultrasound and fluoroscopic guidance. The catheter is ready for use. Electronically Signed: By: Jacqulynn Cadet M.D. On: 10/19/2018 16:48   US Thoracentesis Asp Pleural Space W/img Guide  Result Date: 10/18/2018 INDICATION: Patient with history of presumed stage IV metastatic breast cancer with dyspnea and left pleural effusion; request made for diagnostic and therapeutic left thoracentesis. EXAM: ULTRASOUND GUIDED DIAGNOSTIC AND THERAPEUTIC LEFT THORACENTESIS MEDICATIONS: None COMPLICATIONS: None immediate. PROCEDURE: An ultrasound guided thoracentesis was thoroughly discussed with the patient and questions answered. The benefits, risks, alternatives and complications were also discussed. The patient understands and wishes to proceed with the procedure. Written consent was obtained. Ultrasound was performed to localize and mark an adequate pocket of fluid in the left chest. The area was then prepped and draped in the normal sterile fashion. 1% Lidocaine was used for local anesthesia. Under ultrasound guidance a 6 Fr Safe-T-Centesis catheter was introduced. Thoracentesis  was performed. The catheter was removed and a dressing applied. FINDINGS: A total of approximately 1.2 liters of hazy, yellow fluid was removed. Samples were sent to the laboratory as requested by the clinical team. IMPRESSION: Successful ultrasound guided diagnostic and therapeutic left thoracentesis yielding 1.2 liters of pleural fluid. Read by: Rowe Robert, PA-C Electronically Signed   By: Markus Daft M.D.   On: 10/18/2018 12:03     ASSESSMENT/PLAN:  1.  Suspected metastatic breast cancer to the lungs.  The patient presented with a several week history of cough and skin changes to her left breast.  She has now been admitted to the hospital with worsening shortness of breath and hypoxia. -Thoracentesis placed on 10/18/2018.  Breathing has improved.  Fluid has been sent for pleural fluid analysis. Cytology pending. -Supraclavicular lymph node biopsy was performed by interventional radiology on 10/19/2018. Pathology pending. -Port-A-Cath has been placed and ready for use. -A CT of the abdomen and pelvis showed no evidence of metastatic disease. -An MRI of the brain showed a small 4 mm focus of enhancement.  Radiation oncology has been notified and will arrange for [facility] as an outpatient. -Echocardiogram showed ejection fraction greater than 65%. -From an oncology standpoint, the patient may be discharged home medically stable.  She has a follow-up appointment on 10/23/2018 with radiation oncology and will follow-up with Dr. Maylon Peppers on 10/24/2018.   LOS: 2 days   Mikey Bussing, DNP, AGPCNP-BC, AOCNP 10/20/18

## 2018-10-20 NOTE — Progress Notes (Signed)
SATURATION QUALIFICATIONS: (This note is used to comply with regulatory documentation for home oxygen)  Patient Saturations on Room Air at Rest = 91%  Patient Saturations on Room Air while Ambulating = 86%  Patient Saturations on 2 Liters of oxygen while Ambulating = 88%

## 2018-10-20 NOTE — Progress Notes (Signed)
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 I called her to let her know we were aware of the findings, and Dr. Ida Rogue recommendation for stereotactic radiosurgery to this site. We reviewed the delivery and logistics of this single fraction treatment. She will have a 3T MRI scan coordinated as an outpatient, meet as an outpatient with Dr. Sherwood Gambler, and the proceed with simulation in our department and then subsequent treatment. Her anticipated SRS treatment will be administered the week of 11/06/2018 so we can coordinate all these things. We will follow up with the results of her biopsy and her cytology as well. We will plan to begin her breast radiotherapy however on Monday 3/16. The patient is in agreement with this plan after we discussed the risks, benefits, short, and long term effects. We will have formal consent and show her the treatment planning mask one day next week while she's in our department.      Carola Rhine, PAC

## 2018-10-20 NOTE — Discharge Summary (Signed)
Physician Discharge Summary  Sharon Floyd XAJ:[id number] DOB: [birth year] DOA: 10/17/2018  PCP: Sharon Gouty, FNP  Admit date: 10/17/2018 Discharge date: 10/20/2018  Admitted From: Home.  Disposition:  Home.   Recommendations for Outpatient Follow-up:  1. Follow up with PCP in 1-2 weeks 2. Please obtain BMP/CBC in one week Please follow up with oncology and radiation oncology as recommended.    Oxygen. 3 lit of [place] .   Discharge Condition:STABLE.  CODE STATUS: FULL CODE.  Diet recommendation: Heart Healthy  Brief/Interim Summary: Sharon Floyd y.o.femalewith suspected stage IV metastatic breast cancer,  was admitted to the hospital for hypoxia and dyspnea.  She underwent CT of the chest showing Moderate left pleural effusion, increased in size from the prior CT with increasing left lung atelectasis.  Unchanged appearance of multiple left breast masses including an invasive mass involving the pectoralis major.  Unchanged left axillary, left subpectoral, and mediastinal lymphadenopathy.  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  Thoracentesis and 1.2 L fluid taken out and sent for analysis. She underwent left supraclavicular lymph node biopsy and placement of right IJ single-lumen PowerPort by IR.  Patient still hypoxic and requiring up to 3 L of nasal cannula oxygen to keep sats greater than 90% especially during ambulation. She will need oxygen on discharge  Repeat CXR does not show any recurrent effusion.     Metastatic disease to brain Radiation oncology consulted and currently waiting for biopsy results and  schedule for radiation on Monday   Acute respiratory failure with hypoxia probably secondary to left pleural effusion from metastatic disease.  Discharge Instructions  Discharge Instructions    Diet - low sodium heart healthy   Complete by:  As directed    Diet - low sodium heart healthy   Complete by:  As directed    Discharge instructions   Complete by:  As directed    Please follow up with oncology as recommended.  Please follow up with PCP in one week.   Discharge instructions   Complete by:  As directed    Follow up with oncology and radiation oncology as recommended.     Allergies as of 10/20/2018      Reactions   Aspirin       Medication List    TAKE these medications   LORazepam 0.5 MG tablet Commonly known as:  ATIVAN Take 0.5 tablets (0.25 mg total) by mouth once as needed for up to 2 doses for anxiety (Prior to PET and MRI scan).   naproxen sodium 220 MG tablet Commonly known as:  ALEVE Take 220 mg by mouth daily as needed (pain).   oxyCODONE 5 MG immediate release tablet Commonly known as:  Oxy IR/ROXICODONE Take 0.5-1 tablets (2.5-5 mg total) by mouth every 4 (four) hours as needed for severe pain.            Durable Medical Equipment  (From admission, onward)         Start     Ordered   10/20/18 0945  For home use only DME oxygen  Once    Question Answer Comment  Mode or (Route) Nasal cannula  Liters per Minute 3   Frequency Continuous (stationary and portable oxygen unit needed)   Oxygen conserving device Yes   Oxygen delivery system Gas      10/20/18 0945         Follow-up Information    Sharon Floyd, Sharon Burkitt, FNP. Schedule an appointment as soon as possible for a visit in 1 week(s).   Specialty:  Family Medicine Contact information: [place] [phone]          Allergies  Allergen Reactions  . Aspirin     Consultations:  Oncology  Radiation oncology  IR.    Procedures/Studies: Dg  Chest 1 View  Result Date: 10/18/2018 CLINICAL DATA:  Status post left-sided thoracentesis EXAM: CHEST  1 VIEW COMPARISON:  10/17/2018 FINDINGS: Cardiac shadows within normal limits. Significant reduction in left pleural effusion is noted following thoracentesis. No pneumothorax is noted. Minimal residual fluid remains. Multiple pulmonary metastatic lesions are noted. No acute bony abnormality is seen. IMPRESSION: Stable pulmonary metastatic disease. Improving left-sided effusion following thoracentesis. No pneumothorax is noted. Electronically Signed   By: Sharon Floyd M.D.   On: 10/18/2018 11:51   Dg Chest 2 View  Result Date: 10/20/2018 CLINICAL DATA:  43 year old female with mild chest wall tenderness this morning. Port catheter placement yesterday. EXAM: CHEST - 2 VIEW COMPARISON:  Prior chest x-ray 10/18/2018 FINDINGS: New right IJ approach single-lumen power injectable port catheter. The catheter appears to be in good position with the tip overlying the cavoatrial junction. Stable cardiac and mediastinal contours. Multiple cannonball pulmonary nodules bilaterally consistent with suspected metastatic breast cancer. There is marked enlargement of the left breast shadow relative to the right consistent with known underlying breast mass. No acute osseous abnormality. IMPRESSION: 1. Well-positioned right IJ approach single-lumen power injectable port catheter. 2. Imaging findings suggest left breast cancer with multifocal pulmonary metastatic disease. Electronically Signed   By: Sharon Floyd M.D.   On: 10/20/2018 14:03   Dg Chest 2 View  Result Date: 10/17/2018 CLINICAL DATA:  43 year old female with breast cancer and shortness of breath requiring rapid response. EXAM: CHEST - 2 VIEW COMPARISON:  [facility] chest CT 09/30/2018. FINDINGS: Numerous rounded bilateral pulmonary metastases redemonstrated with superimposed small left pleural effusion. Mildly decreased left lung volume and  decreased left lung ventilation compared to the February CT. No superimposed pneumothorax or pulmonary edema. Stable cardiac size and mediastinal contours. Visualized tracheal air column is within normal limits. Negative visible bowel gas pattern. Asymmetric left breast enlargement redemonstrated. No osseous lesion is evident. IMPRESSION: Extensive pulmonary metastatic disease with small left pleural effusion and worsening left lung base ventilation since the February CT. Electronically Signed   By: Genevie Ann M.D.   On: 10/17/2018 17:51   Ct Angio Chest Pe W And/or Wo Contrast  Result Date: 10/17/2018 CLINICAL DATA:  Cough and shortness of breath. Recently diagnosed breast cancer. EXAM: CT ANGIOGRAPHY CHEST WITH CONTRAST TECHNIQUE: Multidetector CT imaging of the chest was performed using the standard protocol during bolus administration of intravenous contrast. Multiplanar CT image reconstructions and MIPs were obtained to evaluate the vascular anatomy. CONTRAST:  13mL OMNIPAQUE IOHEXOL 350 MG/ML SOLN COMPARISON:  09/30/2018 FINDINGS: Cardiovascular: No lobar or more central pulmonary embolus is identified. Segmental assessment is limited by motion artifact and patient body habitus. The heart is normal in size. There is no pericardial effusion. The thoracic aorta is normal in size. Mediastinum/Nodes: Again partially visualized are multiple left breast masses with left greater  than right breast skin thickening. A 6 x 6 cm mass invading the left pectoralis major muscle is unchanged, and there is a similar appearance of diffuse regional edema. Multiple enlarged left axillary and subpectoral lymph nodes are again partially visualized measuring up to approximately 4 cm in long axis. Partially visualized left supraclavicular lymph nodes are similar to the prior CT. Partially visualized right axillary lymph nodes appear mildly larger than on the prior CT and measure up to 1.5 cm in short axis. Enlarged mediastinal  lymph nodes have not significantly changed with the largest measuring 1.9 cm in short axis in the pre-vascular region. Lungs/Pleura: A moderate-sized left pleural effusion has enlarged from the prior CT with increasing atelectasis in the left lower lobe and to a lesser extent lingula. There is the suggestion of mild underlying pleural thickening, however a discrete pleural mass is not identified. Numerous bilateral lung nodules are again seen with multiple demonstrating a mild interval increase in size including a 3.3 x 3.1 cm left upper lobe mass (series 6, image 48, previously 3.1 x 3.0 cm) and a 2.0 cm right upper lobe nodule (series 6, image 50, previously 1.7 cm). Motion artifact and the enlarging left pleural effusion and atelectasis obscure some nodules. Upper Abdomen: Subcentimeter hypodensity in the hepatic dome on the prior CT is obscured by motion artifact on today's study. Musculoskeletal: No acute osseous abnormality or suspicious osseous lesion. Review of the MIP images confirms the above findings. IMPRESSION: 1. No evidence of lobar or more central pulmonary embolus. Nondiagnostic segmental assessment. 2. Moderate left pleural effusion, increased in size from the prior CT with increasing left lung atelectasis. 3. Unchanged appearance of multiple left breast masses including an invasive mass involving the pectoralis major. 4. Unchanged left axillary, left subpectoral, and mediastinal lymphadenopathy. 5. Mild interval enlargement of multiple lung metastases and of right axillary lymph nodes. Electronically Signed   By: Logan Bores M.D.   On: 10/17/2018 20:05   Mr Jeri Cos HG Contrast  Result Date: 10/19/2018 CLINICAL DATA:  43 y/o F; Breast cancer, initial staging, clinical stage IV. EXAM: MRI HEAD WITHOUT AND WITH CONTRAST TECHNIQUE: Multiplanar, multiecho pulse sequences of the brain and surrounding structures were obtained without and with intravenous contrast. CONTRAST:  10 cc Gadavist.  COMPARISON:  None. FINDINGS: Brain: 4 mm focus of enhancement within the right frontal white matter (series 23, image 37 and series 17, image 17 and series 5, image 85). No additional abnormal enhancement in the brain is identified. No findings of acute stroke, hemorrhage, extra-axial collection, hydrocephalus, or herniation. No additional structural or signal abnormality of the brain is evident. Vascular: Normal flow voids. Skull and upper cervical spine: Normal marrow signal. Sinuses/Orbits: Negative. Other: None. IMPRESSION: Single 4 mm focus of enhancement within right frontal white matter probably representing metastatic disease. Attention at follow-up is recommended. Electronically Signed   By: Kristine Garbe M.D.   On: 10/19/2018 02:43   Ct Abdomen Pelvis W Contrast  Result Date: 10/19/2018 CLINICAL DATA:  Staging EXAM: CT ABDOMEN AND PELVIS WITH CONTRAST TECHNIQUE: Multidetector CT imaging of the abdomen and pelvis was performed using the standard protocol following bolus administration of intravenous contrast. CONTRAST:  162mL OMNIPAQUE IOHEXOL 300 MG/ML  SOLN COMPARISON:  CT chest, 10/17/2018 FINDINGS: Lower chest: Left pleural effusion associated atelectasis consolidation with numerous pulmonary masses and nodules on partially imaged. Skin thickening of the left breast. Hepatobiliary: No focal liver abnormality is seen. No gallstones, gallbladder wall thickening, or biliary dilatation. Pancreas: Unremarkable.  No pancreatic ductal dilatation or surrounding inflammatory changes. Spleen: Normal in size without focal abnormality. Adrenals/Urinary Tract: Adrenal glands are unremarkable. Kidneys are normal, without renal calculi, focal lesion, or hydronephrosis. Bladder is unremarkable. Stomach/Bowel: Stomach is within normal limits. Appendix appears normal. No evidence of bowel wall thickening, distention, or inflammatory changes. Vascular/Lymphatic: No significant vascular findings are present.  No enlarged abdominal or pelvic lymph nodes. Reproductive: No mass or other abnormality. Other: No abdominal wall hernia or abnormality. No abdominopelvic ascites. Musculoskeletal: No acute or significant osseous findings. IMPRESSION: 1. No CT evidence of metastatic disease involving the abdomen or pelvis. 2. Left pleural effusion associated atelectasis consolidation with numerous pulmonary masses and nodules on partially imaged. Skin thickening of the left breast. Findings are in keeping with advanced breast malignancy and as seen on recent CT of the chest, 10/17/2018. Electronically Signed   By: Eddie Candle M.D.   On: 10/19/2018 14:09   Ir US Guide Bx Asp/drain  Addendum Date: 10/19/2018   ADDENDUM REPORT: 10/19/2018 16:58 ADDENDUM: Concurrently performed procedures were inadvertently linked. Following successful placement of the port catheter, attention was turned to the left supraclavicular region which was sterilely prepped and draped in the standard fashion using chlorhexidine skin prep. Ultrasound was used to interrogate the neck. A large supraclavicular lymph node was successfully identified. A suitable skin entry site was selected and marked. Local anesthesia was attained by infiltration with 1 percent lidocaine. A small dermatotomy was made. Under real-time sonographic guidance, multiple 18 gauge core biopsies were obtained of lymph node using the Bard mission biopsy device. Biopsy specimens were placed in formalin and delivered to pathology for further analysis. Post biopsy ultrasound imaging demonstrates no evidence of complication. IMPRESSION: Technically successful ultrasound-guided core biopsy of left supraclavicular lymph node. Electronically Signed   By: Sharon Floyd M.D.   On: 10/19/2018 16:58   Result Date: 10/19/2018 INDICATION: 43 year old female with advanced left breast cancer. She presents for port catheter placement to initiate chemotherapy. EXAM: IMPLANTED PORT A CATH PLACEMENT  WITH ULTRASOUND AND FLUOROSCOPIC GUIDANCE MEDICATIONS: 3 g Ancef; The antibiotic was administered within an appropriate time interval prior to skin puncture. ANESTHESIA/SEDATION: Versed 1 mg IV; Fentanyl 75 mcg IV; Moderate Sedation Time:  22 minutes The patient was continuously monitored during the procedure by the interventional radiology nurse under my direct supervision. FLUOROSCOPY TIME:  0 minutes, 12 seconds (12 mGy) COMPLICATIONS: None immediate. PROCEDURE: The right neck and chest was prepped with chlorhexidine, and draped in the usual sterile fashion using maximum barrier technique (cap and mask, sterile gown, sterile gloves, large sterile sheet, hand hygiene and cutaneous antiseptic). Local anesthesia was attained by infiltration with 1% lidocaine with epinephrine. Ultrasound demonstrated patency of the right internal jugular vein, and this was documented with an image. Under real-time ultrasound guidance, this vein was accessed with a 21 gauge micropuncture needle and image documentation was performed. A small dermatotomy was made at the access site with an 11 scalpel. A 0.018" wire was advanced into the SVC and the access needle exchanged for a 67F micropuncture vascular sheath. The 0.018" wire was then removed and a 0.035" wire advanced into the IVC. An appropriate location for the subcutaneous reservoir was selected below the clavicle and an incision was made through the skin and underlying soft tissues. The subcutaneous tissues were then dissected using a combination of blunt and sharp surgical technique and a pocket was formed. A single lumen power injectable portacatheter was then tunneled through the subcutaneous tissues from the pocket  to the dermatotomy and the port reservoir placed within the subcutaneous pocket. The venous access site was then serially dilated and a peel away vascular sheath placed over the wire. The wire was removed and the port catheter advanced into position under  fluoroscopic guidance. The catheter tip is positioned in the superior cavoatrial junction. This was documented with a spot image. The portacatheter was then tested and found to flush and aspirate well. The port was flushed with saline followed by 100 units/mL heparinized saline. The pocket was then closed in two layers using first subdermal inverted interrupted absorbable sutures followed by a running subcuticular suture. The epidermis was then sealed with Dermabond. The dermatotomy at the venous access site was also closed with Dermabond. IMPRESSION: Successful placement of a right IJ approach Power Port with ultrasound and fluoroscopic guidance. The catheter is ready for use. Electronically Signed: By: Sharon Floyd M.D. On: 10/19/2018 16:48   Ir Imaging Guided Port Insertion  Addendum Date: 10/19/2018   ADDENDUM REPORT: 10/19/2018 16:58 ADDENDUM: Concurrently performed procedures were inadvertently linked. Following successful placement of the port catheter, attention was turned to the left supraclavicular region which was sterilely prepped and draped in the standard fashion using chlorhexidine skin prep. Ultrasound was used to interrogate the neck. A large supraclavicular lymph node was successfully identified. A suitable skin entry site was selected and marked. Local anesthesia was attained by infiltration with 1 percent lidocaine. A small dermatotomy was made. Under real-time sonographic guidance, multiple 18 gauge core biopsies were obtained of lymph node using the Bard mission biopsy device. Biopsy specimens were placed in formalin and delivered to pathology for further analysis. Post biopsy ultrasound imaging demonstrates no evidence of complication. IMPRESSION: Technically successful ultrasound-guided core biopsy of left supraclavicular lymph node. Electronically Signed   By: Sharon Floyd M.D.   On: 10/19/2018 16:58   Result Date: 10/19/2018 INDICATION: 43 year old female with advanced left  breast cancer. She presents for port catheter placement to initiate chemotherapy. EXAM: IMPLANTED PORT A CATH PLACEMENT WITH ULTRASOUND AND FLUOROSCOPIC GUIDANCE MEDICATIONS: 3 g Ancef; The antibiotic was administered within an appropriate time interval prior to skin puncture. ANESTHESIA/SEDATION: Versed 1 mg IV; Fentanyl 75 mcg IV; Moderate Sedation Time:  22 minutes The patient was continuously monitored during the procedure by the interventional radiology nurse under my direct supervision. FLUOROSCOPY TIME:  0 minutes, 12 seconds (12 mGy) COMPLICATIONS: None immediate. PROCEDURE: The right neck and chest was prepped with chlorhexidine, and draped in the usual sterile fashion using maximum barrier technique (cap and mask, sterile gown, sterile gloves, large sterile sheet, hand hygiene and cutaneous antiseptic). Local anesthesia was attained by infiltration with 1% lidocaine with epinephrine. Ultrasound demonstrated patency of the right internal jugular vein, and this was documented with an image. Under real-time ultrasound guidance, this vein was accessed with a 21 gauge micropuncture needle and image documentation was performed. A small dermatotomy was made at the access site with an 11 scalpel. A 0.018" wire was advanced into the SVC and the access needle exchanged for a 9F micropuncture vascular sheath. The 0.018" wire was then removed and a 0.035" wire advanced into the IVC. An appropriate location for the subcutaneous reservoir was selected below the clavicle and an incision was made through the skin and underlying soft tissues. The subcutaneous tissues were then dissected using a combination of blunt and sharp surgical technique and a pocket was formed. A single lumen power injectable portacatheter was then tunneled through the subcutaneous tissues from the pocket to  the dermatotomy and the port reservoir placed within the subcutaneous pocket. The venous access site was then serially dilated and a peel away  vascular sheath placed over the wire. The wire was removed and the port catheter advanced into position under fluoroscopic guidance. The catheter tip is positioned in the superior cavoatrial junction. This was documented with a spot image. The portacatheter was then tested and found to flush and aspirate well. The port was flushed with saline followed by 100 units/mL heparinized saline. The pocket was then closed in two layers using first subdermal inverted interrupted absorbable sutures followed by a running subcuticular suture. The epidermis was then sealed with Dermabond. The dermatotomy at the venous access site was also closed with Dermabond. IMPRESSION: Successful placement of a right IJ approach Power Port with ultrasound and fluoroscopic guidance. The catheter is ready for use. Electronically Signed: By: Sharon Floyd M.D. On: 10/19/2018 16:48   US Thoracentesis Asp Pleural Space W/img Guide  Result Date: 10/18/2018 INDICATION: Patient with history of presumed stage IV metastatic breast cancer with dyspnea and left pleural effusion; request made for diagnostic and therapeutic left thoracentesis. EXAM: ULTRASOUND GUIDED DIAGNOSTIC AND THERAPEUTIC LEFT THORACENTESIS MEDICATIONS: None COMPLICATIONS: None immediate. PROCEDURE: An ultrasound guided thoracentesis was thoroughly discussed with the patient and questions answered. The benefits, risks, alternatives and complications were also discussed. The patient understands and wishes to proceed with the procedure. Written consent was obtained. Ultrasound was performed to localize and mark an adequate pocket of fluid in the left chest. The area was then prepped and draped in the normal sterile fashion. 1% Lidocaine was used for local anesthesia. Under ultrasound guidance a 6 Fr Safe-T-Centesis catheter was introduced. Thoracentesis was performed. The catheter was removed and a dressing applied. FINDINGS: A total of approximately 1.2 liters of hazy, yellow  fluid was removed. Samples were sent to the laboratory as requested by the clinical team. IMPRESSION: Successful ultrasound guided diagnostic and therapeutic left thoracentesis yielding 1.2 liters of pleural fluid. Read by: Rowe Robert, PA-C Electronically Signed   By: Markus Daft M.D.   On: 10/18/2018 12:03     Subjective: No chest pain. Sob improved. Still requiring oxygen.   Discharge Exam: Vitals:   10/19/18 2118 10/20/18 0553  BP: 121/75 (!) 128/93  Pulse: 99 (!) 103  Resp: 17 20  Temp: 98.3 F (36.8 C) 98.8 F (37.1 C)  SpO2: 94% 97%   Vitals:   10/19/18 1605 10/19/18 1611 10/19/18 2118 10/20/18 0553  BP: 133/88 (!) 135/102 121/75 (!) 128/93  Pulse: 89 95 99 (!) 103  Resp: 17 11 17 20   Temp:   98.3 F (36.8 C) 98.8 F (37.1 C)  TempSrc:   Oral Oral  SpO2: 94% 93% 94% 97%  Weight:      Height:        General: Pt is alert, awake, not in acute distress Cardiovascular: RRR, S1/S2 +, no rubs, no gallops Respiratory: CTA bilaterally, no wheezing, no rhonchi Abdominal: Soft, NT, ND, bowel sounds + Extremities: no edema, no cyanosis    The results of significant diagnostics from this hospitalization (including imaging, microbiology, ancillary and laboratory) are listed below for reference.     Microbiology: Recent Results (from the past 240 hour(s))  Culture, body fluid-bottle     Status: None (Preliminary result)   Collection Time: 10/18/18 11:23 AM  Result Value Ref Range Status   Specimen Description PLEURAL LEFT  Final   Special Requests NONE  Final   Culture  Final    NO GROWTH 1 DAY Performed at [facility], [place] [address], [place], [place] [address]    Report Status PENDING  Incomplete  Gram stain     Status: None   Collection Time: 10/18/18 11:23 AM  Result Value Ref Range Status   Specimen Description PLEURAL LEFT  Final   Special Requests NONE  Final   Gram Stain   Final    RARE WBC PRESENT, PREDOMINANTLY PMN NO ORGANISMS SEEN Performed  at [facility], [place] [address], [place], [place] [address]    Report Status 10/18/2018 FINAL  Final     Labs: BNP (last 3 results) Recent Labs    10/17/18 1612  BNP 30.8   Basic Metabolic Panel: Recent Labs  Lab 10/17/18 1612 10/20/18 0608  NA 139 138  K 4.2 4.1  CL 106 102  CO2 25 26  GLUCOSE 89 97  BUN 11 9  CREATININE 0.90 0.84  CALCIUM 9.0 8.8*   Liver Function Tests: Recent Labs  Lab 10/17/18 1612  AST 42*  ALT 29  ALKPHOS 70  BILITOT 0.8  PROT 7.9  ALBUMIN 3.8   No results for input(s): LIPASE, AMYLASE in the last 168 hours. No results for input(s): AMMONIA in the last 168 hours. CBC: Recent Labs  Lab 10/17/18 1612 10/19/18 0625 10/20/18 0608  WBC 8.4 7.0 7.7  NEUTROABS 5.6 4.9  --   HGB 12.6 11.8* 12.2  HCT 40.9 38.3 40.7  MCV 92.1 94.1 95.3  PLT 513* 422* 472*   Cardiac Enzymes: Recent Labs  Lab 10/17/18 1612 10/18/18 0443 10/18/18 1012 10/18/18 1647  TROPONINI 0.08* 0.04* 0.03* 0.03*   BNP: Invalid input(s): POCBNP CBG: No results for input(s): GLUCAP in the last 168 hours. D-Dimer No results for input(s): DDIMER in the last 72 hours. Hgb A1c No results for input(s): HGBA1C in the last 72 hours. Lipid Profile No results for input(s): CHOL, HDL, LDLCALC, TRIG, CHOLHDL, LDLDIRECT in the last 72 hours. Thyroid function studies No results for input(s): TSH, T4TOTAL, T3FREE, THYROIDAB in the last 72 hours.  Invalid input(s): FREET3 Anemia work up No results for input(s): VITAMINB12, FOLATE, FERRITIN, TIBC, IRON, RETICCTPCT in the last 72 hours. Urinalysis No results found for: COLORURINE, APPEARANCEUR, [place], [place], [place], [place], [place], [place], PROTEINUR, UROBILINOGEN, NITRITE, LEUKOCYTESUR Sepsis Labs Invalid input(s): PROCALCITONIN,  WBC,  LACTICIDVEN Microbiology Recent Results (from the past 240 hour(s))  Culture, body fluid-bottle     Status: None (Preliminary result)   Collection Time: 10/18/18 11:23  AM  Result Value Ref Range Status   Specimen Description PLEURAL LEFT  Final   Special Requests NONE  Final   Culture   Final    NO GROWTH 1 DAY Performed at [facility], [address] [place], [place] [address]    Report Status PENDING  Incomplete  Gram stain     Status: None   Collection Time: 10/18/18 11:23 AM  Result Value Ref Range Status   Specimen Description PLEURAL LEFT  Final   Special Requests NONE  Final   Gram Stain   Final    RARE WBC PRESENT, PREDOMINANTLY PMN NO ORGANISMS SEEN Performed at [facility], [place] [address] [place], [place] [address]    Report Status 10/18/2018 FINAL  Final     Time coordinating discharge: 32  minutes  SIGNED:   Hosie Poisson, MD  [facility] 10/20/2018, 2:14 PM Pager   If 7PM-7AM, please contact night-coverage  [email or web address] Password [email or web address]

## 2018-10-23 ENCOUNTER — Telehealth: Payer: Self-pay | Admitting: *Deleted

## 2018-10-23 ENCOUNTER — Other Ambulatory Visit: Payer: Self-pay

## 2018-10-23 ENCOUNTER — Ambulatory Visit
Admission: RE | Admit: 2018-10-23 | Discharge: 2018-10-23 | Disposition: A | Discharge status: Home / Self Care | Payer: BLUE CROSS/BLUE SHIELD | Source: Ambulatory Visit | Attending: Radiation Oncology | Admitting: Radiation Oncology

## 2018-10-23 DIAGNOSIS — Z510 Encounter for antineoplastic radiation therapy: Secondary | ICD-10-CM | POA: Diagnosis not present

## 2018-10-23 DIAGNOSIS — C50812 Malignant neoplasm of overlapping sites of left female breast: Secondary | ICD-10-CM | POA: Diagnosis not present

## 2018-10-23 DIAGNOSIS — C7931 Secondary malignant neoplasm of brain: Secondary | ICD-10-CM | POA: Diagnosis not present

## 2018-10-23 LAB — CULTURE, BODY FLUID W GRAM STAIN -BOTTLE: Culture: NO GROWTH

## 2018-10-23 NOTE — Telephone Encounter (Signed)
Called Ronda back at 1p and added request for PDL-1 testing.

## 2018-10-23 NOTE — Progress Notes (Addendum)
[place] OFFICE PROGRESS NOTE  Patient Care Team: Baruch Gouty, FNP as PCP - General (Family Medicine) Tish Men, MD as Medical Oncologist (Hematology) Cordelia Poche, RN as Oncology Nurse Navigator  HEME/ONC OVERVIEW: 1. 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  -Late 09/2018: evaluation for cough at [facility] ER; dermal thickening and multiple masses within the left breast and a 6cm mass invading the left pectoralis muscle on CT, consistent with primary breast malignancy, extensive thoracic adenopathy and numerous pulmonary metastases, and an indeterminate subcentimeter lucency in the dome of right liver -10/2018: admitted for malignant left pleural effusion (cytology pos for malignancy); MRI brain showed a single 41m R frontal met; CT AP negative; left supraclavicular LN bx showed metastatic carcinoma, receptor status, Foundation One and PD-L1 pending   2. 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  CT CAP showed enlarging left pleural effusion, for which she underwent therapeutic thoracentesis with removal of ~1L pleural effusion, cytology positive for malignancy.  CT abdomen/pelvis was negative for malignancy, but MRI brain unfortunately showed a 419mright frontal lesion, consistent with metastatic disease.  Radiation oncology was consulted while  inpatient, and initiated palliative radiation to the left breast on 10/23/2018.  In addition, radiation oncology is coordinating outpatient evaluation and treatment of the right frontal lesion with neurosurgery.  Left supraclavicular lymph node biopsy was positive for metastatic carcinoma, receptor status, Foundation One and PD-L1 testing pending. -Port placed in 10/2018 while inpatient -Palliative radiation to the left breast started on 10/23/2018, plan for 2 weeks  -I reviewed the imaging and pathology results in detail with the patient -Pending the receptor status, Foundation One and PD-L1 testing, we will determine the treatment options and to start systemic therapy ASAP    Malignant pleural effusion  -S/p diagnostic and therapeutic thoracentesis in 10/2018; cytology positive for malignancy -Clinically, patient denies any symptoms of worsening dyspnea -Systemic treatment to be initiated ASAP once receptor status and PD-L1 testing results are available -Repeat CXR today showed moderate left pleural effusion, likely contributing to the patient's dyspnea -Given the rapidly re-accumulating malignant pleural effusion, I have requested PleurX catheter to be placed by IR  -I have also placed order for home health to do home catheter drainage teacher and supplies; plan to drain the catheter at least once a week, given the rapidly re-accumulating pleural effusion   Metastasis to the brain  -MRI brain incidentally noted a 1m17m frontal lobe metastasis; no vasogenic edema  -Patient is asymptomatic -I have discussed the case at length with radiation oncology, who is coordinating with neurosurgery for outpatient evaluation and treatment (SBRT) -I counseled the patient on any concerning symptoms, including new onset persistent headache, vision change, diplopia, or unilateral extremity weakness/numbness/tingling, for which she should contact the clinic clinic promptly  Cancer-related pain -Likely due to the  left pectoralis muscle invasion by the left breast malignancy -  Pain currently not well controlled -Given the frequency of short-acting pain medication usage, I have started MS-Contin 77m BID, with oxycodone 173mq6hrs PRN for breakthrough pain -I have also prescribed gabapentin 30049mID, as there is likely a neuropathic component due to the muscle and nerve invasion by the breast cancer   Family history of malignancy  -Genetic test pending to assess for any pathogenic mutation   Orders Placed This Encounter  Procedures  . DG Chest 2 View    Standing Status:   Future    Number of Occurrences:   1    Standing Expiration Date:   10/24/2019    Order Specific Question:   Reason for Exam (SYMPTOM  OR DIAGNOSIS REQUIRED)    Answer:   Malignant left pleural effusion s/p thoracentesis    Order Specific Question:   Preferred imaging location?    Answer:   [facility] Question:   Radiology Contrast Protocol - do NOT remove file path    Answer:   [email or web address]    Order Specific Question:   Is patient pregnant?    Answer:   No  . 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    Answer:   No    Order Specific Question:   Preferred Imaging Location?    [facility] ([facility]    Standing Status:   Future    Standing Expiration Date:   11/28/2019  . CMP ([facility])    Standing Status:   Future    Standing Expiration Date:   11/28/2019  . Ambulatory referral to Home Health    Referral Priority:   Urgent    Referral Type:   Home Health Care    Referral Reason:   Specialty Services  Required    Requested Specialty:   [facility] Number of Visits Requested:   1    All questions were answered. The patient knows to call the clinic with any problems, questions or concerns. No barriers to learning was detected.  Return in 1 week for labs, port flush, and clinic appt to follow up pathology results and discuss treatment options.  YanTish MenD 10/24/2018 4:07 PM  CHIEF COMPLAINT: "My left breast still hurts"  INTERVAL HISTORY: Ms. DalHarphamturns to clinic for follow-up of metastatic breast cancer.  Patient reports that since discharge, she continues to have intermittent pain in the left breast, sharp, exacerbated by movement, moderate to severe intensity, for which she takes oxycodone 10 mg approximately 3 times a day with relatively adequate pain control.  She also reports intermittent left-sided back pain, which she attributes to back strain and has tried Voltaren gel with some relief.  She reports that she has mild persistent shortness of breath, exacerbated by activity.  She was discharged from the hospital with home oxygen, but has had some issues with home health not delivering the right oxygen equipment.  She is currently using a portable oxygen tank and awaiting home health to set up home oxygen.  She has not gone back  to work yet, but would like to, despite her exertional dyspnea.  SUMMARY OF ONCOLOGIC HISTORY:   Malignant neoplasm of overlapping sites of breast ([place])   09/30/2018 Imaging    CT chest with contrast (at [facility]): Impressions: 1.  Dermal thickening of the multiple masses within the left breast as well as a invasive 6 cm mass of the left pectoralis major, compatible with primary breast cancer. 2.  Numerous pulmonary metastases.  Left axillary, retropectoral, supraclavicular, and upper mediastinal metastatic lymphadenopathy.  Mildly enlarged right axillary and supraclavicular lymph nodes may be metastatic. 3.  Indeterminant subcentimeter  lucency in the dome of the right liver. 4.  Small left pleural effusion.    10/10/2018 Initial Diagnosis    Breast cancer ([place])    10/17/2018 Imaging    CTA chest: IMPRESSION: 1. No evidence of lobar or more central pulmonary embolus. Nondiagnostic segmental assessment. 2. Moderate left pleural effusion, increased in size from the prior CT with increasing left lung atelectasis. 3. Unchanged appearance of multiple left breast masses including an invasive mass involving the pectoralis major. 4. Unchanged left axillary, left subpectoral, and mediastinal lymphadenopathy. 5. Mild interval enlargement of multiple lung metastases and of right axillary lymph nodes.    10/18/2018 Imaging    MRI brain w/o and w/ contrast: Single 4 mm focus of enhancement within right frontal white matter probably representing metastatic disease. Attention at follow-up is recommended.    10/19/2018 Imaging    CT abdomen/pelvis w/ contrast: IMPRESSION: 1. No CT evidence of metastatic disease involving the abdomen or pelvis. 2. Left pleural effusion associated atelectasis consolidation with numerous pulmonary masses and nodules on partially imaged. Skin thickening of the left breast. Findings are in keeping with advanced breast malignancy and as seen on recent CT of the chest, 10/17/2018.    10/19/2018 Procedure    US-guided bx of left supraclavicular LN     10/19/2018 Pathology Results    Accession: [id number]  Lymph node, needle/core biopsy, left supraclavicular - METASTATIC CARCINOMA     REVIEW OF SYSTEMS:   Constitutional: ( - ) fevers, ( - )  chills , ( - ) night sweats Eyes: ( - ) blurriness of vision, ( - ) double vision, ( - ) watery eyes Ears, nose, mouth, throat, and face: ( - ) mucositis, ( - ) sore throat Respiratory: ( - ) cough, ( - ) dyspnea, ( - ) wheezes Cardiovascular: ( - ) palpitation, ( - ) chest discomfort, ( - ) lower extremity swelling Gastrointestinal:  ( - ) nausea, ( - )  heartburn, ( - ) change in bowel habits Skin: ( + ) abnormal skin rashes Lymphatics: ( - ) new lymphadenopathy, ( - ) easy bruising Neurological: ( - ) numbness, ( - ) tingling, ( - ) new weaknesses Behavioral/Psych: ( - ) mood change, ( - ) new changes  All other systems were reviewed with the patient and are negative.  I have reviewed the past medical history, past surgical history, social history and family history with the patient and they are unchanged from previous note.  ALLERGIES:  is allergic to aspirin.  MEDICATIONS:  Current Outpatient Medications  Medication Sig Dispense Refill  . LORazepam (ATIVAN) 0.5 MG tablet Take 0.5 tablets (0.25 mg total) by mouth once as needed for up to 2 doses for anxiety (Prior to PET and MRI scan). 1 tablet 0  . gabapentin (NEURONTIN) 300 MG capsule Take 1 capsule (300 mg total) by mouth 2 (two) times daily  for 30 days. 60 capsule 3  . morphine (MS CONTIN) 30 MG 12 hr tablet Take 1 tablet (30 mg total) by mouth every 12 (twelve) hours for 30 days. 60 tablet 0  . oxyCODONE (OXY IR/ROXICODONE) 5 MG immediate release tablet Take 2 tablets (10 mg total) by mouth every 6 (six) hours as needed for up to 30 days for severe pain. 90 tablet 0   No current facility-administered medications for this visit.     PHYSICAL EXAMINATION: ECOG PERFORMANCE STATUS: 1 - Symptomatic but completely ambulatory  Today's Vitals   10/24/18 1000  BP: (!) 107/41  Pulse: (!) 114  Resp: (!) 21  Temp: 98.2 F (36.8 C)  TempSrc: Oral  SpO2: 92%  Height: 5' 4"  (1.626 m)  PainSc: 5    Body mass index is 45.83 kg/m.  There were no vitals filed for this visit.  GENERAL: alert, no distress and comfortable SKIN: skin color, texture, turgor are normal, no rashes or significant lesions EYES: conjunctiva are pink and non-injected, sclera clear OROPHARYNX: no exudate, no erythema; lips, buccal mucosa, and tongue normal  NECK: supple, non-tender LUNGS: clear to  auscultation, slightly increased respiratory effort with exertion HEART: regular rhythm, slightly tachycardic, no murmurs and no lower extremity edema ABDOMEN: soft, non-tender, non-distended, normal bowel sounds Musculoskeletal: no cyanosis of digits and no clubbing  PSYCH: alert & oriented x 3, fluent speech NEURO: no focal motor/sensory deficits  LABORATORY DATA:  I have reviewed the data as listed    Component Value Date/Time   NA 138 10/20/2018 0608   K 4.1 10/20/2018 0608   CL 102 10/20/2018 0608   CO2 26 10/20/2018 0608   GLUCOSE 97 10/20/2018 0608   BUN 9 10/20/2018 0608   CREATININE 0.84 10/20/2018 0608   CREATININE 0.99 10/12/2018 1030   CALCIUM 8.8 (L) 10/20/2018 0608   PROT 7.9 10/17/2018 1612   ALBUMIN 3.8 10/17/2018 1612   AST 42 (H) 10/17/2018 1612   AST 20 10/12/2018 1030   ALT 29 10/17/2018 1612   ALT 19 10/12/2018 1030   ALKPHOS 70 10/17/2018 1612   BILITOT 0.8 10/17/2018 1612   BILITOT 0.5 10/12/2018 1030   GFRNONAA >60 10/20/2018 0608   GFRNONAA >60 10/12/2018 1030   GFRAA >60 10/20/2018 0608   GFRAA >60 10/12/2018 1030    No results found for: SPEP, UPEP  Lab Results  Component Value Date   WBC 7.7 10/20/2018   NEUTROABS 4.9 10/19/2018   HGB 12.2 10/20/2018   HCT 40.7 10/20/2018   MCV 95.3 10/20/2018   PLT 472 (H) 10/20/2018      Chemistry      Component Value Date/Time   NA 138 10/20/2018 0608   K 4.1 10/20/2018 0608   CL 102 10/20/2018 0608   CO2 26 10/20/2018 0608   BUN 9 10/20/2018 0608   CREATININE 0.84 10/20/2018 0608   CREATININE 0.99 10/12/2018 1030      Component Value Date/Time   CALCIUM 8.8 (L) 10/20/2018 0608   ALKPHOS 70 10/17/2018 1612   AST 42 (H) 10/17/2018 1612   AST 20 10/12/2018 1030   ALT 29 10/17/2018 1612   ALT 19 10/12/2018 1030   BILITOT 0.8 10/17/2018 1612   BILITOT 0.5 10/12/2018 1030       RADIOGRAPHIC STUDIES: I have personally reviewed the radiological images as listed below and agreed with the  findings in the report. Dg Chest 1 View  Result Date: 10/18/2018 CLINICAL DATA:  Status post left-sided thoracentesis EXAM:  CHEST  1 VIEW COMPARISON:  10/17/2018 FINDINGS: Cardiac shadows within normal limits. Significant reduction in left pleural effusion is noted following thoracentesis. No pneumothorax is noted. Minimal residual fluid remains. Multiple pulmonary metastatic lesions are noted. No acute bony abnormality is seen. IMPRESSION: Stable pulmonary metastatic disease. Improving left-sided effusion following thoracentesis. No pneumothorax is noted. Electronically Signed   By: Inez Catalina M.D.   On: 10/18/2018 11:51   Dg Chest 2 View  Result Date: 10/24/2018 CLINICAL DATA:  Cough and congestion. Shortness of breath. History of breast cancer. EXAM: CHEST - 2 VIEW COMPARISON:  No prior. FINDINGS: PowerPort catheter with tip over right atrium. Mild cardiomegaly. No pulmonary venous congestion. Innumerable bilateral large pulmonary nodular densities are noted most consistent metastatic disease. Infectious etiologies can not be completely excluded. Left base atelectasis and infiltrate. Moderate left pleural effusion. No pneumothorax. IMPRESSION: 1. Innumerable bilateral large pulmonary nodular densities most consistent with metastatic disease 2. Left base atelectasis/infiltrate and moderate left pleural effusion. 3. Cardiomegaly. No pulmonary venous congestion. PowerPort catheter noted with tip over right atrium. Electronically Signed   By: Marcello Moores  Register   On: 10/24/2018 15:29   Dg Chest 2 View  Result Date: 10/20/2018 CLINICAL DATA:  43 year old female with mild chest wall tenderness this morning. Port catheter placement yesterday. EXAM: CHEST - 2 VIEW COMPARISON:  Prior chest x-ray 10/18/2018 FINDINGS: New right IJ approach single-lumen power injectable port catheter. The catheter appears to be in good position with the tip overlying the cavoatrial junction. Stable cardiac and mediastinal  contours. Multiple cannonball pulmonary nodules bilaterally consistent with suspected metastatic breast cancer. There is marked enlargement of the left breast shadow relative to the right consistent with known underlying breast mass. No acute osseous abnormality. IMPRESSION: 1. Well-positioned right IJ approach single-lumen power injectable port catheter. 2. Imaging findings suggest left breast cancer with multifocal pulmonary metastatic disease. Electronically Signed   By: Jacqulynn Cadet M.D.   On: 10/20/2018 14:03   Dg Chest 2 View  Result Date: 10/17/2018 CLINICAL DATA:  43 year old female with breast cancer and shortness of breath requiring rapid response. EXAM: CHEST - 2 VIEW COMPARISON:  [facility] chest CT 09/30/2018. FINDINGS: Numerous rounded bilateral pulmonary metastases redemonstrated with superimposed small left pleural effusion. Mildly decreased left lung volume and decreased left lung ventilation compared to the February CT. No superimposed pneumothorax or pulmonary edema. Stable cardiac size and mediastinal contours. Visualized tracheal air column is within normal limits. Negative visible bowel gas pattern. Asymmetric left breast enlargement redemonstrated. No osseous lesion is evident. IMPRESSION: Extensive pulmonary metastatic disease with small left pleural effusion and worsening left lung base ventilation since the February CT. Electronically Signed   By: Genevie Ann M.D.   On: 10/17/2018 17:51   Ct Angio Chest Pe W And/or Wo Contrast  Result Date: 10/17/2018 CLINICAL DATA:  Cough and shortness of breath. Recently diagnosed breast cancer. EXAM: CT ANGIOGRAPHY CHEST WITH CONTRAST TECHNIQUE: Multidetector CT imaging of the chest was performed using the standard protocol during bolus administration of intravenous contrast. Multiplanar CT image reconstructions and MIPs were obtained to evaluate the vascular anatomy. CONTRAST:  168m OMNIPAQUE IOHEXOL 350 MG/ML SOLN COMPARISON:   09/30/2018 FINDINGS: Cardiovascular: No lobar or more central pulmonary embolus is identified. Segmental assessment is limited by motion artifact and patient body habitus. The heart is normal in size. There is no pericardial effusion. The thoracic aorta is normal in size. Mediastinum/Nodes: Again partially visualized are multiple left breast masses with left greater than right  breast skin thickening. A 6 x 6 cm mass invading the left pectoralis major muscle is unchanged, and there is a similar appearance of diffuse regional edema. Multiple enlarged left axillary and subpectoral lymph nodes are again partially visualized measuring up to approximately 4 cm in long axis. Partially visualized left supraclavicular lymph nodes are similar to the prior CT. Partially visualized right axillary lymph nodes appear mildly larger than on the prior CT and measure up to 1.5 cm in short axis. Enlarged mediastinal lymph nodes have not significantly changed with the largest measuring 1.9 cm in short axis in the pre-vascular region. Lungs/Pleura: A moderate-sized left pleural effusion has enlarged from the prior CT with increasing atelectasis in the left lower lobe and to a lesser extent lingula. There is the suggestion of mild underlying pleural thickening, however a discrete pleural mass is not identified. Numerous bilateral lung nodules are again seen with multiple demonstrating a mild interval increase in size including a 3.3 x 3.1 cm left upper lobe mass (series 6, image 48, previously 3.1 x 3.0 cm) and a 2.0 cm right upper lobe nodule (series 6, image 50, previously 1.7 cm). Motion artifact and the enlarging left pleural effusion and atelectasis obscure some nodules. Upper Abdomen: Subcentimeter hypodensity in the hepatic dome on the prior CT is obscured by motion artifact on today's study. Musculoskeletal: No acute osseous abnormality or suspicious osseous lesion. Review of the MIP images confirms the above findings.  IMPRESSION: 1. No evidence of lobar or more central pulmonary embolus. Nondiagnostic segmental assessment. 2. Moderate left pleural effusion, increased in size from the prior CT with increasing left lung atelectasis. 3. Unchanged appearance of multiple left breast masses including an invasive mass involving the pectoralis major. 4. Unchanged left axillary, left subpectoral, and mediastinal lymphadenopathy. 5. Mild interval enlargement of multiple lung metastases and of right axillary lymph nodes. Electronically Signed   By: Logan Bores M.D.   On: 10/17/2018 20:05   Mr Jeri Cos ZR Contrast  Result Date: 10/19/2018 CLINICAL DATA:  43 y/o F; Breast cancer, initial staging, clinical stage IV. EXAM: MRI HEAD WITHOUT AND WITH CONTRAST TECHNIQUE: Multiplanar, multiecho pulse sequences of the brain and surrounding structures were obtained without and with intravenous contrast. CONTRAST:  10 cc Gadavist. COMPARISON:  None. FINDINGS: Brain: 4 mm focus of enhancement within the right frontal white matter (series 23, image 37 and series 17, image 17 and series 5, image 85). No additional abnormal enhancement in the brain is identified. No findings of acute stroke, hemorrhage, extra-axial collection, hydrocephalus, or herniation. No additional structural or signal abnormality of the brain is evident. Vascular: Normal flow voids. Skull and upper cervical spine: Normal marrow signal. Sinuses/Orbits: Negative. Other: None. IMPRESSION: Single 4 mm focus of enhancement within right frontal white matter probably representing metastatic disease. Attention at follow-up is recommended. Electronically Signed   By: Kristine Garbe M.D.   On: 10/19/2018 02:43   Ct Abdomen Pelvis W Contrast  Result Date: 10/19/2018 CLINICAL DATA:  Staging EXAM: CT ABDOMEN AND PELVIS WITH CONTRAST TECHNIQUE: Multidetector CT imaging of the abdomen and pelvis was performed using the standard protocol following bolus administration of  intravenous contrast. CONTRAST:  165m OMNIPAQUE IOHEXOL 300 MG/ML  SOLN COMPARISON:  CT chest, 10/17/2018 FINDINGS: Lower chest: Left pleural effusion associated atelectasis consolidation with numerous pulmonary masses and nodules on partially imaged. Skin thickening of the left breast. Hepatobiliary: No focal liver abnormality is seen. No gallstones, gallbladder wall thickening, or biliary dilatation. Pancreas: Unremarkable. No pancreatic  ductal dilatation or surrounding inflammatory changes. Spleen: Normal in size without focal abnormality. Adrenals/Urinary Tract: Adrenal glands are unremarkable. Kidneys are normal, without renal calculi, focal lesion, or hydronephrosis. Bladder is unremarkable. Stomach/Bowel: Stomach is within normal limits. Appendix appears normal. No evidence of bowel wall thickening, distention, or inflammatory changes. Vascular/Lymphatic: No significant vascular findings are present. No enlarged abdominal or pelvic lymph nodes. Reproductive: No mass or other abnormality. Other: No abdominal wall hernia or abnormality. No abdominopelvic ascites. Musculoskeletal: No acute or significant osseous findings. IMPRESSION: 1. No CT evidence of metastatic disease involving the abdomen or pelvis. 2. Left pleural effusion associated atelectasis consolidation with numerous pulmonary masses and nodules on partially imaged. Skin thickening of the left breast. Findings are in keeping with advanced breast malignancy and as seen on recent CT of the chest, 10/17/2018. Electronically Signed   By: Eddie Candle M.D.   On: 10/19/2018 14:09   Ir US Guide Bx Asp/drain  Addendum Date: 10/19/2018   ADDENDUM REPORT: 10/19/2018 16:58 ADDENDUM: Concurrently performed procedures were inadvertently linked. Following successful placement of the port catheter, attention was turned to the left supraclavicular region which was sterilely prepped and draped in the standard fashion using chlorhexidine skin prep. Ultrasound  was used to interrogate the neck. A large supraclavicular lymph node was successfully identified. A suitable skin entry site was selected and marked. Local anesthesia was attained by infiltration with 1 percent lidocaine. A small dermatotomy was made. Under real-time sonographic guidance, multiple 18 gauge core biopsies were obtained of lymph node using the [employer] mission biopsy device. Biopsy specimens were placed in formalin and delivered to pathology for further analysis. Post biopsy ultrasound imaging demonstrates no evidence of complication. IMPRESSION: Technically successful ultrasound-guided core biopsy of left supraclavicular lymph node. Electronically Signed   By: Jacqulynn Cadet M.D.   On: 10/19/2018 16:58   Result Date: 10/19/2018 INDICATION: 43 year old female with advanced left breast cancer. She presents for port catheter placement to initiate chemotherapy. EXAM: IMPLANTED PORT A CATH PLACEMENT WITH ULTRASOUND AND FLUOROSCOPIC GUIDANCE MEDICATIONS: 3 g Ancef; The antibiotic was administered within an appropriate time interval prior to skin puncture. ANESTHESIA/SEDATION: Versed 1 mg IV; Fentanyl 75 mcg IV; Moderate Sedation Time:  22 minutes The patient was continuously monitored during the procedure by the interventional radiology nurse under my direct supervision. FLUOROSCOPY TIME:  0 minutes, 12 seconds (12 mGy) COMPLICATIONS: None immediate. PROCEDURE: The right neck and chest was prepped with chlorhexidine, and draped in the usual sterile fashion using maximum barrier technique (cap and mask, sterile gown, sterile gloves, large sterile sheet, hand hygiene and cutaneous antiseptic). Local anesthesia was attained by infiltration with 1% lidocaine with epinephrine. Ultrasound demonstrated patency of the right internal jugular vein, and this was documented with an image. Under real-time ultrasound guidance, this vein was accessed with a 21 gauge micropuncture needle and image documentation was  performed. A small dermatotomy was made at the access site with an 11 scalpel. A 0.018" wire was advanced into the SVC and the access needle exchanged for a 69F micropuncture vascular sheath. The 0.018" wire was then removed and a 0.035" wire advanced into the IVC. An appropriate location for the subcutaneous reservoir was selected below the clavicle and an incision was made through the skin and underlying soft tissues. The subcutaneous tissues were then dissected using a combination of blunt and sharp surgical technique and a pocket was formed. A single lumen power injectable portacatheter was then tunneled through the subcutaneous tissues from the pocket to the  dermatotomy and the port reservoir placed within the subcutaneous pocket. The venous access site was then serially dilated and a peel away vascular sheath placed over the wire. The wire was removed and the port catheter advanced into position under fluoroscopic guidance. The catheter tip is positioned in the superior cavoatrial junction. This was documented with a spot image. The portacatheter was then tested and found to flush and aspirate well. The port was flushed with saline followed by 100 units/mL heparinized saline. The pocket was then closed in two layers using first subdermal inverted interrupted absorbable sutures followed by a running subcuticular suture. The epidermis was then sealed with Dermabond. The dermatotomy at the venous access site was also closed with Dermabond. IMPRESSION: Successful placement of a right IJ approach Power Port with ultrasound and fluoroscopic guidance. The catheter is ready for use. Electronically Signed: By: Jacqulynn Cadet M.D. On: 10/19/2018 16:48   Ir Imaging Guided Port Insertion  Addendum Date: 10/19/2018   ADDENDUM REPORT: 10/19/2018 16:58 ADDENDUM: Concurrently performed procedures were inadvertently linked. Following successful placement of the port catheter, attention was turned to the left  supraclavicular region which was sterilely prepped and draped in the standard fashion using chlorhexidine skin prep. Ultrasound was used to interrogate the neck. A large supraclavicular lymph node was successfully identified. A suitable skin entry site was selected and marked. Local anesthesia was attained by infiltration with 1 percent lidocaine. A small dermatotomy was made. Under real-time sonographic guidance, multiple 18 gauge core biopsies were obtained of lymph node using the Bard mission biopsy device. Biopsy specimens were placed in formalin and delivered to pathology for further analysis. Post biopsy ultrasound imaging demonstrates no evidence of complication. IMPRESSION: Technically successful ultrasound-guided core biopsy of left supraclavicular lymph node. Electronically Signed   By: Jacqulynn Cadet M.D.   On: 10/19/2018 16:58   Result Date: 10/19/2018 INDICATION: 43 year old female with advanced left breast cancer. She presents for port catheter placement to initiate chemotherapy. EXAM: IMPLANTED PORT A CATH PLACEMENT WITH ULTRASOUND AND FLUOROSCOPIC GUIDANCE MEDICATIONS: 3 g Ancef; The antibiotic was administered within an appropriate time interval prior to skin puncture. ANESTHESIA/SEDATION: Versed 1 mg IV; Fentanyl 75 mcg IV; Moderate Sedation Time:  22 minutes The patient was continuously monitored during the procedure by the interventional radiology nurse under my direct supervision. FLUOROSCOPY TIME:  0 minutes, 12 seconds (12 mGy) COMPLICATIONS: None immediate. PROCEDURE: The right neck and chest was prepped with chlorhexidine, and draped in the usual sterile fashion using maximum barrier technique (cap and mask, sterile gown, sterile gloves, large sterile sheet, hand hygiene and cutaneous antiseptic). Local anesthesia was attained by infiltration with 1% lidocaine with epinephrine. Ultrasound demonstrated patency of the right internal jugular vein, and this was documented with an image.  Under real-time ultrasound guidance, this vein was accessed with a 21 gauge micropuncture needle and image documentation was performed. A small dermatotomy was made at the access site with an 11 scalpel. A 0.018" wire was advanced into the SVC and the access needle exchanged for a 63F micropuncture vascular sheath. The 0.018" wire was then removed and a 0.035" wire advanced into the IVC. An appropriate location for the subcutaneous reservoir was selected below the clavicle and an incision was made through the skin and underlying soft tissues. The subcutaneous tissues were then dissected using a combination of blunt and sharp surgical technique and a pocket was formed. A single lumen power injectable portacatheter was then tunneled through the subcutaneous tissues from the pocket to the dermatotomy  and the port reservoir placed within the subcutaneous pocket. The venous access site was then serially dilated and a peel away vascular sheath placed over the wire. The wire was removed and the port catheter advanced into position under fluoroscopic guidance. The catheter tip is positioned in the superior cavoatrial junction. This was documented with a spot image. The portacatheter was then tested and found to flush and aspirate well. The port was flushed with saline followed by 100 units/mL heparinized saline. The pocket was then closed in two layers using first subdermal inverted interrupted absorbable sutures followed by a running subcuticular suture. The epidermis was then sealed with Dermabond. The dermatotomy at the venous access site was also closed with Dermabond. IMPRESSION: Successful placement of a right IJ approach Power Port with ultrasound and fluoroscopic guidance. The catheter is ready for use. Electronically Signed: By: Jacqulynn Cadet M.D. 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 EXAM: ULTRASOUND GUIDED DIAGNOSTIC AND THERAPEUTIC LEFT THORACENTESIS MEDICATIONS: None COMPLICATIONS: None immediate. PROCEDURE: An ultrasound guided thoracentesis was thoroughly discussed with the patient and questions answered. The benefits, risks, alternatives and complications were also discussed. The patient understands and wishes to proceed with the procedure. Written consent was obtained. Ultrasound was performed to localize and mark an adequate pocket of fluid in the left chest. The area was then prepped and draped in the normal sterile fashion. 1% Lidocaine was used for local anesthesia. Under ultrasound guidance a 6 Fr Safe-T-Centesis catheter was introduced. Thoracentesis was performed. The catheter was removed and a dressing applied. FINDINGS: A total of approximately 1.2 liters of hazy, yellow fluid was removed. Samples were sent to the laboratory as requested by the clinical team. IMPRESSION: Successful ultrasound guided diagnostic and therapeutic left thoracentesis yielding 1.2 liters of pleural fluid. Read by: Rowe Robert, PA-C Electronically Signed   By: Markus Daft M.D.   On: 10/18/2018 12:03

## 2018-10-23 NOTE — Telephone Encounter (Signed)
Spoke to [place] at [employer] and requested that Foundation One testing be added to specimen [id number]

## 2018-10-24 ENCOUNTER — Ambulatory Visit (HOSPITAL_BASED_OUTPATIENT_CLINIC_OR_DEPARTMENT_OTHER)
Admission: RE | Admit: 2018-10-24 | Discharge: 2018-10-24 | Disposition: A | Discharge status: Home / Self Care | Payer: BLUE CROSS/BLUE SHIELD | Source: Ambulatory Visit | Attending: Hematology | Admitting: Hematology

## 2018-10-24 ENCOUNTER — Encounter: Payer: Self-pay | Admitting: Hematology

## 2018-10-24 ENCOUNTER — Inpatient Hospital Stay (HOSPITAL_BASED_OUTPATIENT_CLINIC_OR_DEPARTMENT_OTHER): Payer: BLUE CROSS/BLUE SHIELD | Admitting: Hematology

## 2018-10-24 ENCOUNTER — Ambulatory Visit
Admission: RE | Admit: 2018-10-24 | Discharge: 2018-10-24 | Disposition: A | Discharge status: Home / Self Care | Payer: BLUE CROSS/BLUE SHIELD | Source: Ambulatory Visit | Attending: Radiation Oncology | Admitting: Radiation Oncology

## 2018-10-24 ENCOUNTER — Other Ambulatory Visit: Payer: Self-pay

## 2018-10-24 ENCOUNTER — Encounter: Payer: Self-pay | Admitting: *Deleted

## 2018-10-24 VITALS — BP 107/41 | HR 114 | Temp 98.2°F | Resp 21 | Ht 64.0 in

## 2018-10-24 DIAGNOSIS — C7931 Secondary malignant neoplasm of brain: Secondary | ICD-10-CM

## 2018-10-24 DIAGNOSIS — R05 Cough: Secondary | ICD-10-CM | POA: Diagnosis not present

## 2018-10-24 DIAGNOSIS — Z79899 Other long term (current) drug therapy: Secondary | ICD-10-CM

## 2018-10-24 DIAGNOSIS — Z87891 Personal history of nicotine dependence: Secondary | ICD-10-CM

## 2018-10-24 DIAGNOSIS — Z803 Family history of malignant neoplasm of breast: Secondary | ICD-10-CM

## 2018-10-24 DIAGNOSIS — C7802 Secondary malignant neoplasm of left lung: Secondary | ICD-10-CM

## 2018-10-24 DIAGNOSIS — J910 Malignant pleural effusion: Secondary | ICD-10-CM

## 2018-10-24 DIAGNOSIS — C50812 Malignant neoplasm of overlapping sites of left female breast: Secondary | ICD-10-CM | POA: Insufficient documentation

## 2018-10-24 DIAGNOSIS — Z809 Family history of malignant neoplasm, unspecified: Secondary | ICD-10-CM

## 2018-10-24 DIAGNOSIS — Z510 Encounter for antineoplastic radiation therapy: Secondary | ICD-10-CM | POA: Diagnosis not present

## 2018-10-24 DIAGNOSIS — C7801 Secondary malignant neoplasm of right lung: Secondary | ICD-10-CM

## 2018-10-24 DIAGNOSIS — Z800 Family history of malignant neoplasm of digestive organs: Secondary | ICD-10-CM

## 2018-10-24 DIAGNOSIS — R0602 Shortness of breath: Secondary | ICD-10-CM | POA: Diagnosis not present

## 2018-10-24 DIAGNOSIS — G893 Neoplasm related pain (acute) (chronic): Secondary | ICD-10-CM | POA: Insufficient documentation

## 2018-10-24 MED ORDER — GABAPENTIN 300 MG PO CAPS: 300.0000 mg | ORAL_CAPSULE | Freq: Two times a day (BID) | ORAL | 3 refills | Status: DC

## 2018-10-24 MED ORDER — OXYCODONE HCL 5 MG PO TABS: 10.0000 mg | ORAL_TABLET | Freq: Four times a day (QID) | ORAL | 0 refills | Status: DC | PRN

## 2018-10-24 MED ORDER — MORPHINE SULFATE ER 30 MG PO TBCR: 30.0000 mg | EXTENDED_RELEASE_TABLET | Freq: Two times a day (BID) | ORAL | 0 refills | Status: DC

## 2018-10-24 NOTE — Addendum Note (Signed)
Addended by: Tish Men on: 10/24/2018 04:07 PM   Modules accepted: Orders

## 2018-10-25 ENCOUNTER — Other Ambulatory Visit: Payer: Self-pay | Admitting: Radiation Therapy

## 2018-10-25 ENCOUNTER — Ambulatory Visit
Admission: RE | Admit: 2018-10-25 | Discharge: 2018-10-25 | Disposition: A | Discharge status: Home / Self Care | Payer: BLUE CROSS/BLUE SHIELD | Source: Ambulatory Visit | Attending: Radiation Oncology | Admitting: Radiation Oncology

## 2018-10-25 ENCOUNTER — Encounter: Payer: Self-pay | Admitting: *Deleted

## 2018-10-25 DIAGNOSIS — C50812 Malignant neoplasm of overlapping sites of left female breast: Secondary | ICD-10-CM | POA: Diagnosis not present

## 2018-10-25 DIAGNOSIS — C7931 Secondary malignant neoplasm of brain: Secondary | ICD-10-CM | POA: Diagnosis not present

## 2018-10-25 DIAGNOSIS — Z510 Encounter for antineoplastic radiation therapy: Secondary | ICD-10-CM | POA: Diagnosis not present

## 2018-10-25 NOTE — Progress Notes (Signed)
Sharon Floyd Psychosocial Distress Screening Clinical Social Work  Clinical Social Work was referred by distress screening protocol.  The patient scored a 6 on the Psychosocial Distress Thermometer which indicates moderate distress. Clinical Social Worker contacted patient at home to assess for distress and other psychosocial needs.  CSW left patient a message offering support and information on the support team and support services at [facility].  CSW provided contact information and encouraged patient to call with any questions or concerns.    ONCBCN DISTRESS SCREENING 10/17/2018  Screening Type Initial Screening  Distress experienced in past week (1-10) 6  Practical problem type Work/school  Emotional problem type Nervousness/Anxiety;Adjusting to illness  Physical Problem type Pain;Breathing  Other Reached by phone [phone]    Johnnye Lana, MSW, LCSW, OSW-C Clinical Social Worker [facility] [phone]

## 2018-10-26 ENCOUNTER — Other Ambulatory Visit: Payer: Self-pay

## 2018-10-26 ENCOUNTER — Other Ambulatory Visit: Payer: Self-pay | Admitting: Hematology

## 2018-10-26 ENCOUNTER — Ambulatory Visit
Admission: RE | Admit: 2018-10-26 | Discharge: 2018-10-26 | Disposition: A | Discharge status: Home / Self Care | Payer: BLUE CROSS/BLUE SHIELD | Source: Ambulatory Visit | Attending: Radiation Oncology | Admitting: Radiation Oncology

## 2018-10-26 ENCOUNTER — Encounter: Payer: Self-pay | Admitting: *Deleted

## 2018-10-26 ENCOUNTER — Telehealth: Payer: Self-pay | Admitting: *Deleted

## 2018-10-26 DIAGNOSIS — C50812 Malignant neoplasm of overlapping sites of left female breast: Secondary | ICD-10-CM

## 2018-10-26 DIAGNOSIS — J9601 Acute respiratory failure with hypoxia: Secondary | ICD-10-CM | POA: Diagnosis not present

## 2018-10-26 DIAGNOSIS — Z510 Encounter for antineoplastic radiation therapy: Secondary | ICD-10-CM | POA: Diagnosis not present

## 2018-10-26 DIAGNOSIS — C7931 Secondary malignant neoplasm of brain: Secondary | ICD-10-CM | POA: Diagnosis not present

## 2018-10-26 DIAGNOSIS — J910 Malignant pleural effusion: Secondary | ICD-10-CM

## 2018-10-26 NOTE — Telephone Encounter (Signed)
Request for PleurX supplies faxed to [facility]  [phone]

## 2018-10-26 NOTE — Progress Notes (Unsigned)
thorac

## 2018-10-26 NOTE — Progress Notes (Signed)
Patient called requesting results of CXR on the 17th. When speaking to patient, it was clear that she was struggling to breathe and was significantly short of breath. She confirmed these symptoms and stated that unless she was still, she felt like she couldn't catch her breath. At rest she is 94% on 3L of continuous O2.   Currently, she is scheduled for PleurX cath placement on 11/01/2018.  Called IR to see if this appointment could be moved up. They cannot accommodate that request. Spoke to Dr Maylon Peppers and he will place an order for a therapeutic thoracentesis for today or tomorrow.   Patient scheduled for thoracentesis 10/27/2018 at 11:30 to arrive at 11:15a. Patient instruction on time, date and location. She is also instructed to go to the ED if her breathing becomes worse, prior to her appointment for urgent assessment. She understands.

## 2018-10-27 ENCOUNTER — Ambulatory Visit (HOSPITAL_COMMUNITY)
Admission: RE | Admit: 2018-10-27 | Discharge: 2018-10-27 | Disposition: A | Discharge status: Home / Self Care | Payer: BLUE CROSS/BLUE SHIELD | Source: Ambulatory Visit | Attending: Radiology | Admitting: Radiology

## 2018-10-27 ENCOUNTER — Ambulatory Visit
Admission: RE | Admit: 2018-10-27 | Discharge: 2018-10-27 | Disposition: A | Discharge status: Home / Self Care | Payer: BLUE CROSS/BLUE SHIELD | Source: Ambulatory Visit | Attending: Radiation Oncology | Admitting: Radiation Oncology

## 2018-10-27 ENCOUNTER — Other Ambulatory Visit: Payer: Self-pay | Admitting: Hematology

## 2018-10-27 ENCOUNTER — Other Ambulatory Visit (HOSPITAL_COMMUNITY): Payer: BLUE CROSS/BLUE SHIELD

## 2018-10-27 ENCOUNTER — Ambulatory Visit (HOSPITAL_COMMUNITY)
Admission: RE | Admit: 2018-10-27 | Discharge: 2018-10-27 | Disposition: A | Discharge status: Home / Self Care | Payer: BLUE CROSS/BLUE SHIELD | Source: Ambulatory Visit | Attending: Hematology | Admitting: Hematology

## 2018-10-27 DIAGNOSIS — C50812 Malignant neoplasm of overlapping sites of left female breast: Secondary | ICD-10-CM

## 2018-10-27 DIAGNOSIS — J90 Pleural effusion, not elsewhere classified: Secondary | ICD-10-CM | POA: Diagnosis not present

## 2018-10-27 DIAGNOSIS — C7931 Secondary malignant neoplasm of brain: Secondary | ICD-10-CM | POA: Diagnosis not present

## 2018-10-27 DIAGNOSIS — J910 Malignant pleural effusion: Secondary | ICD-10-CM | POA: Diagnosis not present

## 2018-10-27 DIAGNOSIS — Z9889 Other specified postprocedural states: Secondary | ICD-10-CM | POA: Diagnosis not present

## 2018-10-27 DIAGNOSIS — Z510 Encounter for antineoplastic radiation therapy: Secondary | ICD-10-CM | POA: Diagnosis not present

## 2018-10-27 DIAGNOSIS — C50919 Malignant neoplasm of unspecified site of unspecified female breast: Secondary | ICD-10-CM

## 2018-10-27 MED ORDER — RADIAPLEXRX EX GEL
Freq: Once | CUTANEOUS | Status: AC
  Administered 2018-10-27: 15:00:00 via TOPICAL

## 2018-10-27 MED ORDER — LIDOCAINE HCL 1 % IJ SOLN
INTRAMUSCULAR | Status: AC
  Filled 2018-10-27: qty 20

## 2018-10-27 NOTE — Procedures (Signed)
Ultrasound-guided  therapeutic left thoracentesis performed yielding 1.2 liters of hazy, yellow  fluid. No immediate complications. Follow-up chest x-ray pending. EBL < 1 cc.

## 2018-10-30 ENCOUNTER — Ambulatory Visit
Admission: RE | Admit: 2018-10-30 | Discharge: 2018-10-30 | Disposition: A | Discharge status: Home / Self Care | Payer: BLUE CROSS/BLUE SHIELD | Source: Ambulatory Visit | Attending: Radiation Oncology | Admitting: Radiation Oncology

## 2018-10-30 ENCOUNTER — Other Ambulatory Visit: Payer: Self-pay

## 2018-10-30 ENCOUNTER — Ambulatory Visit
Admit: 2018-10-30 | Discharge: 2018-10-30 | Disposition: A | Discharge status: Home / Self Care | Payer: BLUE CROSS/BLUE SHIELD | Attending: Radiation Oncology | Admitting: Radiation Oncology

## 2018-10-30 DIAGNOSIS — C7931 Secondary malignant neoplasm of brain: Secondary | ICD-10-CM | POA: Diagnosis not present

## 2018-10-30 DIAGNOSIS — C7949 Secondary malignant neoplasm of other parts of nervous system: Principal | ICD-10-CM

## 2018-10-30 DIAGNOSIS — Z510 Encounter for antineoplastic radiation therapy: Secondary | ICD-10-CM | POA: Diagnosis not present

## 2018-10-30 DIAGNOSIS — C50812 Malignant neoplasm of overlapping sites of left female breast: Secondary | ICD-10-CM | POA: Diagnosis not present

## 2018-10-30 MED ORDER — GADOBENATE DIMEGLUMINE 529 MG/ML IV SOLN
20.0000 mL | Freq: Once | INTRAVENOUS | Status: AC | PRN
  Administered 2018-10-30: 20 mL via INTRAVENOUS

## 2018-10-31 ENCOUNTER — Other Ambulatory Visit: Payer: Self-pay | Admitting: Radiology

## 2018-10-31 ENCOUNTER — Other Ambulatory Visit: Payer: Self-pay

## 2018-10-31 ENCOUNTER — Ambulatory Visit
Admission: RE | Admit: 2018-10-31 | Discharge: 2018-10-31 | Disposition: A | Discharge status: Home / Self Care | Payer: BLUE CROSS/BLUE SHIELD | Source: Ambulatory Visit | Attending: Radiation Oncology | Admitting: Radiation Oncology

## 2018-10-31 VITALS — BP 115/73 | HR 111 | Temp 99.2°F | Resp 24 | Ht 64.0 in | Wt 264.0 lb

## 2018-10-31 DIAGNOSIS — C50812 Malignant neoplasm of overlapping sites of left female breast: Secondary | ICD-10-CM | POA: Diagnosis not present

## 2018-10-31 DIAGNOSIS — C7931 Secondary malignant neoplasm of brain: Secondary | ICD-10-CM

## 2018-10-31 DIAGNOSIS — Z510 Encounter for antineoplastic radiation therapy: Secondary | ICD-10-CM | POA: Diagnosis not present

## 2018-10-31 MED ORDER — HEPARIN SOD (PORK) LOCK FLUSH 100 UNIT/ML IV SOLN
500.0000 [IU] | Freq: Once | INTRAVENOUS | Status: AC
  Administered 2018-10-31: 500 [IU] via INTRAVENOUS

## 2018-10-31 MED ORDER — SODIUM CHLORIDE 0.9% FLUSH
10.0000 mL | Freq: Once | INTRAVENOUS | Status: AC
  Administered 2018-10-31: 10 mL via INTRAVENOUS

## 2018-10-31 NOTE — Progress Notes (Signed)
Has armband been applied?  Yes  Does patient have an allergy to IV contrast dye?: No   Has patient ever received premedication for IV contrast dye?: N/A  Does patient take metformin?: No  If patient does take metformin when was the last dose:N/A    Date of lab work: 10/20/2018 BUN: 9 CR: 0.84 EGfr: >60  IV site: R Chest Port  Has IV site been added to flowsheet? Yes   BP 115/73 (BP Location: Right Arm, Patient Position: Sitting)   Pulse (!) 111   Temp 99.2 F (37.3 C)   Resp (!) 24   Ht 5' 4"  (1.626 m)   Wt 264 lb (119.7 kg)   LMP 10/04/2018   SpO2 95%   BMI 45.32 kg/m   LMP 10/04/2018

## 2018-11-01 ENCOUNTER — Other Ambulatory Visit: Payer: Self-pay

## 2018-11-01 ENCOUNTER — Ambulatory Visit (HOSPITAL_COMMUNITY)
Admission: RE | Admit: 2018-11-01 | Discharge: 2018-11-01 | Disposition: A | Discharge status: Home / Self Care | Payer: BLUE CROSS/BLUE SHIELD | Source: Ambulatory Visit | Attending: Diagnostic Radiology | Admitting: Diagnostic Radiology

## 2018-11-01 ENCOUNTER — Inpatient Hospital Stay: Payer: BLUE CROSS/BLUE SHIELD

## 2018-11-01 ENCOUNTER — Ambulatory Visit
Admission: RE | Admit: 2018-11-01 | Discharge: 2018-11-01 | Disposition: A | Discharge status: Home / Self Care | Payer: BLUE CROSS/BLUE SHIELD | Source: Ambulatory Visit | Attending: Radiation Oncology | Admitting: Radiation Oncology

## 2018-11-01 ENCOUNTER — Ambulatory Visit (HOSPITAL_COMMUNITY)
Admission: RE | Admit: 2018-11-01 | Discharge: 2018-11-01 | Disposition: A | Discharge status: Home / Self Care | Payer: BLUE CROSS/BLUE SHIELD | Source: Ambulatory Visit | Attending: Hematology | Admitting: Hematology

## 2018-11-01 ENCOUNTER — Encounter (HOSPITAL_COMMUNITY): Payer: Self-pay

## 2018-11-01 DIAGNOSIS — Z510 Encounter for antineoplastic radiation therapy: Secondary | ICD-10-CM | POA: Diagnosis not present

## 2018-11-01 DIAGNOSIS — R0602 Shortness of breath: Secondary | ICD-10-CM | POA: Diagnosis not present

## 2018-11-01 DIAGNOSIS — C50912 Malignant neoplasm of unspecified site of left female breast: Secondary | ICD-10-CM | POA: Insufficient documentation

## 2018-11-01 DIAGNOSIS — Z886 Allergy status to analgesic agent status: Secondary | ICD-10-CM | POA: Diagnosis not present

## 2018-11-01 DIAGNOSIS — Z9689 Presence of other specified functional implants: Secondary | ICD-10-CM

## 2018-11-01 DIAGNOSIS — C7981 Secondary malignant neoplasm of breast: Secondary | ICD-10-CM | POA: Diagnosis not present

## 2018-11-01 DIAGNOSIS — J910 Malignant pleural effusion: Secondary | ICD-10-CM | POA: Insufficient documentation

## 2018-11-01 DIAGNOSIS — C50812 Malignant neoplasm of overlapping sites of left female breast: Secondary | ICD-10-CM

## 2018-11-01 DIAGNOSIS — C7931 Secondary malignant neoplasm of brain: Secondary | ICD-10-CM | POA: Diagnosis not present

## 2018-11-01 DIAGNOSIS — J90 Pleural effusion, not elsewhere classified: Secondary | ICD-10-CM | POA: Diagnosis not present

## 2018-11-01 HISTORY — PX: IR PERC PLEURAL DRAIN W/INDWELL CATH W/IMG GUIDE: IMG5383

## 2018-11-01 LAB — CBC WITH DIFFERENTIAL/PLATELET
Abs Immature Granulocytes: 0.05 10*3/uL (ref 0.00–0.07)
Basophils Absolute: 0 10*3/uL (ref 0.0–0.1)
Basophils Relative: 0 %
EOS ABS: 0 10*3/uL (ref 0.0–0.5)
Eosinophils Relative: 0 %
HEMATOCRIT: 32.9 % — AB (ref 36.0–46.0)
HEMOGLOBIN: 10.2 g/dL — AB (ref 12.0–15.0)
Immature Granulocytes: 1 %
Lymphocytes Relative: 7 %
Lymphs Abs: 0.4 10*3/uL — ABNORMAL LOW (ref 0.7–4.0)
MCH: 28.3 pg (ref 26.0–34.0)
MCHC: 31 g/dL (ref 30.0–36.0)
MCV: 91.1 fL (ref 80.0–100.0)
Monocytes Absolute: 0.6 10*3/uL (ref 0.1–1.0)
Monocytes Relative: 12 %
Neutro Abs: 4 10*3/uL (ref 1.7–7.7)
Neutrophils Relative %: 80 %
Platelets: 473 10*3/uL — ABNORMAL HIGH (ref 150–400)
RBC: 3.61 MIL/uL — ABNORMAL LOW (ref 3.87–5.11)
RDW: 12.8 % (ref 11.5–15.5)
WBC: 5 10*3/uL (ref 4.0–10.5)
nRBC: 0 % (ref 0.0–0.2)

## 2018-11-01 LAB — PROTIME-INR
INR: 0.9 (ref 0.8–1.2)
Prothrombin Time: 12.2 seconds (ref 11.4–15.2)

## 2018-11-01 MED ORDER — LIDOCAINE HCL 1 % IJ SOLN
INTRAMUSCULAR | Status: AC | PRN
  Administered 2018-11-01: 20 mL via INTRADERMAL

## 2018-11-01 MED ORDER — FENTANYL CITRATE (PF) 100 MCG/2ML IJ SOLN
INTRAMUSCULAR | Status: AC
  Filled 2018-11-01: qty 2

## 2018-11-01 MED ORDER — SODIUM CHLORIDE 0.9 % IV SOLN
INTRAVENOUS | Status: DC
  Administered 2018-11-01: 14:00:00 via INTRAVENOUS

## 2018-11-01 MED ORDER — MIDAZOLAM HCL 2 MG/2ML IJ SOLN
INTRAMUSCULAR | Status: AC
  Filled 2018-11-01: qty 2

## 2018-11-01 MED ORDER — CEFAZOLIN SODIUM-DEXTROSE 2-4 GM/100ML-% IV SOLN
2.0000 g | INTRAVENOUS | Status: AC
  Administered 2018-11-01: 2 g via INTRAVENOUS

## 2018-11-01 MED ORDER — MIDAZOLAM HCL 2 MG/2ML IJ SOLN
INTRAMUSCULAR | Status: AC | PRN
  Administered 2018-11-01 (×4): 1 mg via INTRAVENOUS

## 2018-11-01 MED ORDER — HEPARIN SOD (PORK) LOCK FLUSH 100 UNIT/ML IV SOLN
500.0000 [IU] | INTRAVENOUS | Status: AC | PRN
  Administered 2018-11-01: 500 [IU]
  Filled 2018-11-01: qty 5

## 2018-11-01 MED ORDER — LIDOCAINE HCL 1 % IJ SOLN
INTRAMUSCULAR | Status: AC
  Filled 2018-11-01: qty 20

## 2018-11-01 MED ORDER — CEFAZOLIN SODIUM-DEXTROSE 2-4 GM/100ML-% IV SOLN
INTRAVENOUS | Status: AC
  Administered 2018-11-01: 2 g via INTRAVENOUS
  Filled 2018-11-01: qty 100

## 2018-11-01 MED ORDER — HYDROCODONE-ACETAMINOPHEN 5-325 MG PO TABS
1.0000 | ORAL_TABLET | ORAL | Status: DC | PRN
  Administered 2018-11-01: 2 via ORAL
  Filled 2018-11-01: qty 2

## 2018-11-01 MED ORDER — FENTANYL CITRATE (PF) 100 MCG/2ML IJ SOLN
INTRAMUSCULAR | Status: AC | PRN
  Administered 2018-11-01 (×2): 50 ug via INTRAVENOUS

## 2018-11-01 NOTE — Progress Notes (Signed)
  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  Identity was confirmed.  All relevant records and images related to the planned course of therapy were reviewed.  The patient freely provided informed written consent to proceed with treatment after reviewing the details related to the planned course of therapy. The consent form was witnessed and verified by the simulation staff. Intravenous access was established for contrast administration. Then, the patient was set-up in a stable reproducible supine position for radiation therapy.  A relocatable thermoplastic stereotactic head frame was fabricated for precise immobilization.  CT images were obtained.  Surface markings were placed.  The CT images were loaded into the planning software and fused with the patient's targeting MRI scan.  Then the target and avoidance structures were contoured.  Treatment planning then occurred.  The radiation prescription was entered and confirmed.  I have requested 3D planning  I have requested a DVH of the following structures: Brain stem, brain, left eye, right eye, lenses, optic chiasm, target volumes, uninvolved brain, and normal tissue.    SPECIAL TREATMENT PROCEDURE:  The planned course of therapy using radiation constitutes a special treatment procedure. Special care is required in the management of this patient for the following reasons. This treatment constitutes a Special Treatment Procedure for the following reason: High dose per fraction requiring special monitoring for increased toxicities of treatment including daily imaging.  The special nature of the planned course of radiotherapy will require increased physician supervision and oversight to ensure patient's safety with  optimal treatment outcomes.  PLAN:  The patient will receive 20 Gy in 1 fraction.   ------------------------------------------------  Jodelle Gross, MD, PhD

## 2018-11-01 NOTE — Discharge Instructions (Addendum)
Indwelling Pleural Catheter Home Guide ° °An indwelling pleural catheter is a thin, flexible tube that is inserted under your skin and into your chest. The catheter drains excess fluid that collects in the area between the chest wall and the lungs (pleural space).After the catheter is inserted, it can be attached to a bottle that collects fluid. °The pleural catheter will allow you to drain fluid from your chest at home on a regular basis (sometimes daily). This will eliminate the need for frequent visits to the hospital or clinic to drain the fluid. The catheter may be removed after the excess fluid problem is resolved, usually after 2-3 months. It is important to follow instructions from your health care provider about how to drain and care for your catheter. °What are the risks? °Generally, this is a safe procedure. However, problems may occur, including: °· Infection. °· Skin damage around the catheter. °· Lung damage. °· Failure of the chest tube to work properly. °· Spreading of cancer cells along the catheter, if you have cancer. °Supplies needed: °· Vacuum-sealed drainage bottle with attached drainage line. °· Sterile dressing. °· Sterile alcohol pads. °· Sterile gloves. °· Valve cap. °· Sterile gauze pads, 4 × 4 inch (10 cm × 10 cm). °· Tape. °· Adhesive dressing. °· Sterile foam catheter pad. °How to care for your catheter and insertion site °· Wash your hands with soap and warm water before and after touching the catheter or insertion site. If soap and water are not available, use hand sanitizer. °· Check your bandage (dressing) daily to make sure it is clean and dry. °· Keep the skin around the catheter clean and dry. °· Check the catheter regularly for any cracks or kinks in the tubing. °· Check your catheter insertion site every day for signs of infection. Check for: °? Skin breakdown. °? Redness, swelling, or pain. °? Fluid or blood. °? Warmth. °? Pus or a bad smell. °How to drain your catheter °You  may need to drain your catheter every day, or more or less often as told by your health care provider. Follow instructions from your health care provider about how to drain your catheter. You may also refer to instructions that come with the drainage system. To drain the catheter: °1. Wash your hands with soap and warm water. If soap and water are not available, use hand sanitizer. °2. Carefully remove the dressing from around the catheter. °3. Wash your hands again. °4. Put on the gloves provided. °5. Prepare the vacuum-sealed drainage bottle and drainage line. Close the drainage line of the vacuum-sealed drainage bottle by squeezing the pinch clamp or rolling the wheel of the roller clamp toward the bottle. The vacuum in the bottle will be lost if the line is not closed completely. °6. Remove the access tip cover from the drainage line. Do not touch the end. Set it on a sterile surface. °7. Remove the catheter valve cap and throw it away. °8. Use an alcohol pad to clean the end of the catheter. °9. Insert the access tip into the catheter valve. Make sure the valve and access tip are securely connected. Listen for a click to confirm that they are connected. °10. Insert the T plunger to break the vacuum seal on the drainage bottle. °11. Open the clamp on the drainage line. °12. Allow the catheter to drain. Keep the catheter and the drainage bottle below the level of your chest. There may be a one-way valve on the end of the   tubing that will allow liquid and air to flow out of the catheter without letting air inside. °13. Drain the amount of fluid as told by your health care provider. It usually takes 5-15 minutes. Do not drain more than 1000 mL of fluid. You may feel a little discomfort while you are draining. If the pain is severe, stop draining and contact your health care provider. °14. After you finish draining the catheter, remove the drainage bottle tubing from the catheter. °15. Use a clean alcohol pad to  wipe the catheter tip. °16. Place a clean cap on the end of the catheter. °17. Use an alcohol pad to clean the skin around the catheter. °18. Allow the skin to air-dry. °19. Put the catheter pad on your skin. Curl the catheter into loops and place it on the pad. Do not place the catheter on your skin. °20. Replace the dressing over the catheter. °21. Discard the drainage bottle as instructed by your health care provider. Do not reuse the drainage bottle. °How to change your dressing °Change your dressing at least once a week, or more often if needed to keep the dressing dry. Be sure to change the dressing whenever it becomes moist. Your health care provider will tell you how often to change your dressing. °1. Wash your hands with soap and warm water. If soap and water are not available, use hand sanitizer. °2. Gently remove the old dressing. Avoid using scissors to remove the dressing. Sharp objects may damage the catheter. °3. Wash the skin around the insertion site with mild, fragrance-free soap and warm water. Rinse well, then pat the area dry with a clean cloth. °4. Check the skin around the catheter for signs of infection. Check for: °? Skin breakdown. °? Redness, swelling, or pain. °? Fluid or blood. °? Warmth. °? Pus or a bad smell. °5. If your catheter was stitched (sutured) to your skin, look at the suture to make sure it is still anchored in your skin. °6. Do not apply creams, ointments, or alcohol to the area. Let your skin air-dry completely before you apply a new dressing. °7. Curl the catheter into loops and place it on the sterile catheter pad. Do not place the catheter on your skin. °8. If you do not have a pad, use a clean dressing. Slide the dressing under the disk that holds the drainage catheter in place. °9. Use gauze to cover the catheter and the catheter pad. The catheter should rest on the pad or dressing, not on your skin. °10. Tape the dressing to your skin. You may be instructed to use an  adhesive dressing covering instead of gauze and tape. °11. Wash your hands with soap and warm water. If soap and water are not available, use hand sanitizer. °General recommendations °· Always wash your hands with soap and warm water before and after caring for your catheter and drainage bottle. Use a mild, fragrance-free soap. If soap and water are not available, use hand sanitizer. °· Always make sure there are no leaks in the catheter or drainage bottle. °· Each time you drain the catheter, note the color and amount of fluid. °· Do not touch the tip of the catheter or the drainage bottle tubing. °· Do not reuse drainage bottles. °· Do not take baths, swim, or use a hot tub until your health care provider approves. Ask your health care provider if you may take showers. You may only be allowed to take sponge baths. °· Take   deep breaths regularly, followed by a cough. Doing this can help to prevent lung infection. °Contact a health care provider if: °· You have any questions about caring for your catheter or drainage bottle. °· You still have pain at the catheter insertion site more than 2 days after your procedure. °· You have pain while draining your catheter. °· Your catheter becomes bent, twisted, or cracked. °· The connection between the catheter and the collection bottle becomes loose. °· You have any of these around your catheter insertion site or coming from it: °? Skin breakdown. °? Redness, swelling, or pain. °? Fluid or blood. °? Warmth. °? Pus or a bad smell. °Get help right away if: °· You have a fever or chills. °· You have chest pain. °· You have dizziness or shortness of breath. °· You have severe redness, swelling, or pain at your catheter insertion site. °· The catheter comes out. °· The catheter is blocked or clogged. °Summary °· An indwelling pleural catheter is a thin, flexible tube that is inserted under your skin and into your chest. The catheter drains excess fluid that collects in the area  between the chest wall and the lungs (pleural space). °· It is important to follow instructions from your health care provider about how to drain and care for your catheter. °· Do not touch the tip of the catheter or the drainage bottle tubing. °· Always wash your hands with soap and water before and after caring for your catheter and drainage bottle. If soap and water are not available, use hand sanitizer. °This information is not intended to replace advice given to you by your health care provider. Make sure you discuss any questions you have with your health care provider. °Document Released: 11/18/2016 Document Revised: 11/18/2016 Document Reviewed: 11/18/2016 °Elsevier Interactive Patient Education © 2019 [employer] °Moderate Conscious Sedation, Adult, Care After °These instructions provide you with information about caring for yourself after your procedure. Your health care provider may also give you more specific instructions. Your treatment has been planned according to current medical practices, but problems sometimes occur. Call your health care provider if you have any problems or questions after your procedure. °What can I expect after the procedure? °After your procedure, it is common: °· To feel sleepy for several hours. °· To feel clumsy and have poor balance for several hours. °· To have poor judgment for several hours. °· To vomit if you eat too soon. °Follow these instructions at home: °For at least 24 hours after the procedure: ° °· Do not: °? Participate in activities where you could fall or become injured. °? Drive. °? Use heavy machinery. °? Drink alcohol. °? Take sleeping pills or medicines that cause drowsiness. °? Make important decisions or sign legal documents. °? Take care of children on your own. °· Rest. °Eating and drinking °· Follow the diet recommended by your health care provider. °· If you vomit: °? Drink water, juice, or soup when you can drink without vomiting. °? Make sure  you have little or no nausea before eating solid foods. °General instructions °· Have a responsible adult stay with you until you are awake and alert. °· Take over-the-counter and prescription medicines only as told by your health care provider. °· If you smoke, do not smoke without supervision. °· Keep all follow-up visits as told by your health care provider. This is important. °Contact a health care provider if: °· You keep feeling nauseous or you keep vomiting. °· You feel   light-headed. °· You develop a rash. °· You have a fever. °Get help right away if: °· You have trouble breathing. °This information is not intended to replace advice given to you by your health care provider. Make sure you discuss any questions you have with your health care provider. °Document Released: 05/16/2013 Document Revised: 12/29/2015 Document Reviewed: 11/15/2015 °Elsevier Interactive Patient Education © 2019 [employer] ° °

## 2018-11-01 NOTE — H&P (Signed)
Referring Physician(s): Zhao,Yan  Supervising Physician: Markus Daft  Patient Status:  [facility]  Chief Complaint:  Breast cancer, shortness of breath  Subjective: Patient familiar to IR service from prior left thoracentesis x2, latest on 3/20 yielding 1.2 L, Port-A-Cath placement  as well as left supraclavicular lymph node biopsy on 10/19/18.  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  She currently denies fever, headache, chest pain, abdominal pain, nausea, vomiting or bleeding.  She does have dyspnea, cough, back pain.   Past Medical History:  Diagnosis Date  . Arthritis   . Cancer [facility])    breast  . Family history of breast cancer   . Family history of colon cancer    Past Surgical History:  Procedure Laterality Date  . IR IMAGING GUIDED PORT INSERTION  10/19/2018  . IR US GUIDE BX ASP/DRAIN  10/19/2018  . PATELLA REALIGNMENT Left 1992      Allergies: Aspirin  Medications: Prior to Admission medications   Medication Sig Start Date End Date Taking? Authorizing Provider  gabapentin (NEURONTIN) 300 MG capsule Take 1 capsule (300 mg total) by mouth 2 (two) times daily for 30 days. 10/24/18 11/23/18 Yes Tish Men, MD  morphine (MS CONTIN) 30 MG 12 hr tablet Take 1 tablet (30 mg total) by mouth every 12 (twelve) hours for 30 days. 10/24/18 11/23/18 Yes Tish Men, MD  oxyCODONE (OXY IR/ROXICODONE) 5 MG immediate release tablet Take 2 tablets (10 mg total) by mouth every 6 (six) hours as needed for up to 30 days for severe pain. 10/24/18 11/23/18 Yes Tish Men, MD  LORazepam (ATIVAN) 0.5 MG tablet Take 0.5 tablets (0.25 mg total) by mouth once as needed for up to 2 doses for anxiety (Prior to PET and MRI scan). 10/12/18   Tish Men, MD     Vital Signs: BP (!) 143/77   Pulse (!) 114   Temp 99.2 F (37.3 C) (Oral)   Resp (!) 22   LMP  10/04/2018   SpO2 95%   Physical Exam awake, alert.  Chest with diminished breath sounds left base, right clear.  Heart with slightly tachycardic but regular rhythm.  Abdomen obese, soft, positive bowel sounds, nontender.  Extremities with full range of motion.  Imaging: Mr Jeri Cos QB Contrast  Result Date: 10/30/2018 CLINICAL DATA:  Metastatic breast cancer. Recent discovery of a right frontal metastasis. S RS planning. EXAM: MRI HEAD WITHOUT AND WITH CONTRAST TECHNIQUE: Multiplanar, multiecho pulse sequences of the brain and surrounding structures were obtained without and with intravenous contrast. CONTRAST:  62mL MULTIHANCE GADOBENATE DIMEGLUMINE 529 MG/ML IV SOLN COMPARISON:  10/18/2018 FINDINGS: Brain: Today's study suffers from some motion degradation. No change is appreciated compared to the previous study. Brain has normal appearance with exception of a 4 mm metastasis in the right frontal white matter, without mass effect or edema. No second lesion. No hydrocephalus or extra-axial collection. No skull or skull base lesion is seen. Vascular: Major vessels at the base of the brain show flow. Skull and upper cervical spine: Negative Sinuses/Orbits: Clear/normal Other: None IMPRESSION: No change since the previous study. Single 4 mm metastasis in the right frontal white matter. Axial image 100, series 13. Electronically Signed   By: Nelson Chimes M.D.   On: 10/30/2018 20:52    Labs:  CBC: Recent Labs    10/12/18 1030 10/17/18 1612 10/19/18 0625 10/20/18  0071  WBC 7.4 8.4 7.0 7.7  HGB 13.0 12.6 11.8* 12.2  HCT 40.3 40.9 38.3 40.7  PLT 504* 513* 422* 472*    COAGS: Recent Labs    10/17/18 1612  INR 0.9    BMP: Recent Labs    10/12/18 1030 10/17/18 1612 10/20/18 0608  NA 139 139 138  K 4.2 4.2 4.1  CL 103 106 102  CO2 27 25 26   GLUCOSE 111* 89 97  BUN 11 11 9   CALCIUM 9.8 9.0 8.8*  CREATININE 0.99 0.90 0.84  GFRNONAA >60 >60 >60  GFRAA >60 >60 >60    LIVER  FUNCTION TESTS: Recent Labs    10/12/18 1030 10/17/18 1612  BILITOT 0.5 0.8  AST 20 42*  ALT 19 29  ALKPHOS 65 70  PROT 7.7 7.9  ALBUMIN 4.1 3.8    Assessment and Plan: Pt with 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  Details/risks of procedure, including but not limited to, internal bleeding, infection, pneumothorax discussed with patient with her understanding and consent.   LABS PENDING   Electronically Signed: D. Rowe Robert, PA-C 11/01/2018, 1:56 PM   I spent a total of 25 minutes at the the patient's bedside AND on the patient's hospital floor or unit, greater than 50% of which was counseling/coordinating care for left Pleurx catheter placement

## 2018-11-01 NOTE — Procedures (Signed)
Interventional Radiology Procedure:   Indications: Metastatic breast cancer and recurrent left pleural effusion  Procedure: Placement of left chest tunneled pleural catheter (Pleurx)  Findings: Pleurx in left pleural space, removed 1 liter of yellow fluid.  Complications: None     EBL: Less than 10 ml  Plan: CXR now and discharge to home later today.     Suzane Vanderweide R. Anselm Pancoast, MD  Pager: [phone]

## 2018-11-02 ENCOUNTER — Ambulatory Visit
Admission: RE | Admit: 2018-11-02 | Discharge: 2018-11-02 | Disposition: A | Discharge status: Home / Self Care | Payer: BLUE CROSS/BLUE SHIELD | Source: Ambulatory Visit | Attending: Radiation Oncology | Admitting: Radiation Oncology

## 2018-11-02 DIAGNOSIS — C50812 Malignant neoplasm of overlapping sites of left female breast: Secondary | ICD-10-CM | POA: Diagnosis not present

## 2018-11-02 DIAGNOSIS — C7931 Secondary malignant neoplasm of brain: Secondary | ICD-10-CM | POA: Diagnosis not present

## 2018-11-02 DIAGNOSIS — Z510 Encounter for antineoplastic radiation therapy: Secondary | ICD-10-CM | POA: Diagnosis not present

## 2018-11-03 ENCOUNTER — Telehealth: Payer: Self-pay | Admitting: Hematology

## 2018-11-03 ENCOUNTER — Inpatient Hospital Stay: Payer: BLUE CROSS/BLUE SHIELD

## 2018-11-03 ENCOUNTER — Inpatient Hospital Stay (HOSPITAL_BASED_OUTPATIENT_CLINIC_OR_DEPARTMENT_OTHER): Payer: BLUE CROSS/BLUE SHIELD | Admitting: Hematology

## 2018-11-03 ENCOUNTER — Encounter: Payer: Self-pay | Admitting: Hematology

## 2018-11-03 ENCOUNTER — Ambulatory Visit: Payer: BLUE CROSS/BLUE SHIELD

## 2018-11-03 ENCOUNTER — Ambulatory Visit
Admission: RE | Admit: 2018-11-03 | Discharge: 2018-11-03 | Disposition: A | Discharge status: Home / Self Care | Payer: BLUE CROSS/BLUE SHIELD | Source: Ambulatory Visit | Attending: Radiation Oncology | Admitting: Radiation Oncology

## 2018-11-03 ENCOUNTER — Other Ambulatory Visit: Payer: Self-pay

## 2018-11-03 ENCOUNTER — Encounter: Payer: Self-pay | Admitting: *Deleted

## 2018-11-03 ENCOUNTER — Other Ambulatory Visit: Payer: Self-pay | Admitting: Hematology

## 2018-11-03 ENCOUNTER — Encounter: Payer: Self-pay | Admitting: General Practice

## 2018-11-03 VITALS — BP 120/52 | HR 115 | Temp 98.0°F | Resp 17 | Ht 64.0 in | Wt 260.4 lb

## 2018-11-03 DIAGNOSIS — Z510 Encounter for antineoplastic radiation therapy: Secondary | ICD-10-CM | POA: Diagnosis not present

## 2018-11-03 DIAGNOSIS — C7931 Secondary malignant neoplasm of brain: Secondary | ICD-10-CM

## 2018-11-03 DIAGNOSIS — C7801 Secondary malignant neoplasm of right lung: Secondary | ICD-10-CM | POA: Diagnosis not present

## 2018-11-03 DIAGNOSIS — C50812 Malignant neoplasm of overlapping sites of left female breast: Secondary | ICD-10-CM

## 2018-11-03 DIAGNOSIS — D649 Anemia, unspecified: Secondary | ICD-10-CM

## 2018-11-03 DIAGNOSIS — Z79899 Other long term (current) drug therapy: Secondary | ICD-10-CM

## 2018-11-03 DIAGNOSIS — C7802 Secondary malignant neoplasm of left lung: Secondary | ICD-10-CM | POA: Diagnosis not present

## 2018-11-03 DIAGNOSIS — Z171 Estrogen receptor negative status [ER-]: Secondary | ICD-10-CM

## 2018-11-03 DIAGNOSIS — G893 Neoplasm related pain (acute) (chronic): Secondary | ICD-10-CM

## 2018-11-03 DIAGNOSIS — Z87891 Personal history of nicotine dependence: Secondary | ICD-10-CM

## 2018-11-03 DIAGNOSIS — J910 Malignant pleural effusion: Secondary | ICD-10-CM

## 2018-11-03 DIAGNOSIS — Z809 Family history of malignant neoplasm, unspecified: Secondary | ICD-10-CM

## 2018-11-03 DIAGNOSIS — Z800 Family history of malignant neoplasm of digestive organs: Secondary | ICD-10-CM

## 2018-11-03 DIAGNOSIS — Z803 Family history of malignant neoplasm of breast: Secondary | ICD-10-CM

## 2018-11-03 DIAGNOSIS — Z7189 Other specified counseling: Secondary | ICD-10-CM | POA: Insufficient documentation

## 2018-11-03 LAB — CMP (CANCER CENTER ONLY)
ALT: 18 U/L (ref 0–44)
AST: 26 U/L (ref 15–41)
Albumin: 3.2 g/dL — ABNORMAL LOW (ref 3.5–5.0)
Alkaline Phosphatase: 66 U/L (ref 38–126)
Anion gap: 8 (ref 5–15)
BUN: 9 mg/dL (ref 6–20)
CO2: 29 mmol/L (ref 22–32)
Calcium: 8.6 mg/dL — ABNORMAL LOW (ref 8.9–10.3)
Chloride: 99 mmol/L (ref 98–111)
Creatinine: 0.7 mg/dL (ref 0.44–1.00)
GFR, Estimated: >60 mL/min (ref >60)
Glucose, Bld: 91 mg/dL (ref 70–99)
Potassium: 4.6 mmol/L (ref 3.5–5.1)
Sodium: 136 mmol/L (ref 135–145)
Total Bilirubin: 0.4 mg/dL (ref 0.3–1.2)
Total Protein: 6.9 g/dL (ref 6.5–8.1)

## 2018-11-03 LAB — CBC WITH DIFFERENTIAL (CANCER CENTER ONLY)
Abs Immature Granulocytes: 0.06 10*3/uL (ref 0.00–0.07)
Basophils Absolute: 0 10*3/uL (ref 0.0–0.1)
Basophils Relative: 0 %
Eosinophils Absolute: 0 10*3/uL (ref 0.0–0.5)
Eosinophils Relative: 0 %
HCT: 32.7 % — ABNORMAL LOW (ref 36.0–46.0)
Hemoglobin: 10.1 g/dL — ABNORMAL LOW (ref 12.0–15.0)
Immature Granulocytes: 1 %
Lymphocytes Relative: 4 %
Lymphs Abs: 0.2 10*3/uL — ABNORMAL LOW (ref 0.7–4.0)
MCH: 27.6 pg (ref 26.0–34.0)
MCHC: 30.9 g/dL (ref 30.0–36.0)
MCV: 89.3 fL (ref 80.0–100.0)
Monocytes Absolute: 0.6 10*3/uL (ref 0.1–1.0)
Monocytes Relative: 11 %
Neutro Abs: 4.4 10*3/uL (ref 1.7–7.7)
Neutrophils Relative %: 84 %
Platelet Count: 439 10*3/uL — ABNORMAL HIGH (ref 150–400)
RBC: 3.66 MIL/uL — ABNORMAL LOW (ref 3.87–5.11)
RDW: 12.8 % (ref 11.5–15.5)
WBC Count: 5.2 10*3/uL (ref 4.0–10.5)
nRBC: 0 % (ref 0.0–0.2)

## 2018-11-03 LAB — MAGNESIUM: Magnesium: 2.3 mg/dL (ref 1.7–2.4)

## 2018-11-03 MED ORDER — DEXAMETHASONE 4 MG PO TABS: 8.0000 mg | ORAL_TABLET | Freq: Two times a day (BID) | ORAL | 4 refills | Status: DC

## 2018-11-03 MED ORDER — HEPARIN SOD (PORK) LOCK FLUSH 100 UNIT/ML IV SOLN
500.0000 [IU] | Freq: Once | INTRAVENOUS | Status: AC
  Administered 2018-11-03: 500 [IU] via INTRAVENOUS
  Filled 2018-11-03: qty 5

## 2018-11-03 MED ORDER — LORAZEPAM 0.5 MG PO TABS: 0.5000 mg | ORAL_TABLET | Freq: Four times a day (QID) | ORAL | 0 refills | Status: DC | PRN

## 2018-11-03 MED ORDER — SODIUM CHLORIDE 0.9% FLUSH
10.0000 mL | Freq: Once | INTRAVENOUS | Status: AC
  Administered 2018-11-03: 10 mL via INTRAVENOUS
  Filled 2018-11-03: qty 10

## 2018-11-03 MED ORDER — LIDOCAINE-PRILOCAINE 2.5-2.5 % EX CREA: TOPICAL_CREAM | CUTANEOUS | 3 refills | Status: AC

## 2018-11-03 MED ORDER — PROCHLORPERAZINE MALEATE 10 MG PO TABS: 10.0000 mg | ORAL_TABLET | Freq: Four times a day (QID) | ORAL | 4 refills | Status: AC | PRN

## 2018-11-03 MED ORDER — ONDANSETRON HCL 8 MG PO TABS: 8.0000 mg | ORAL_TABLET | Freq: Two times a day (BID) | ORAL | 4 refills | Status: AC | PRN

## 2018-11-03 NOTE — Patient Instructions (Signed)

## 2018-11-03 NOTE — Telephone Encounter (Signed)
lmom to inform pt of chemo education class 3/31 at 0900 per 3/27 LOS

## 2018-11-03 NOTE — Progress Notes (Signed)
Per Dr. Maylon Peppers, I faxed the paperwork to [facility] for chest tubes. Fax number is [phone].

## 2018-11-03 NOTE — Progress Notes (Signed)
[place] OFFICE PROGRESS NOTE  Patient Care Team: Rakes, Connye Burkitt, FNP as PCP - General (Family Medicine) Tish Men, MD as Medical Oncologist (Hematology) Cordelia Poche, RN as Oncology Nurse Navigator  HEME/ONC OVERVIEW: 1. 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 Foundation One and PD-L1 pending   2. 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 She will also receive G-CSF support.  -Some of the side-effects included, though not limited to, risk of fatigue, weight loss, risk of allergic reactions, pancytopenia, change in bowel habits, cardiac failure, admission to hospital for various reasons, and risks of death.  -Long term side-effects are also discussed including permanent damage to nerve function, chronic fatigue, and rare secondary malignancy including bone marrow disorders.  -The patient is aware that the response rates discussed earlier is not guaranteed.   -After a long discussion, patient made an informed decision to proceed with the prescribed plan of care.  -Tentatively we will plan to start the 1st treatment on 11/09/2018, with Neulasta on 11/10/2018  -If Foundation One is non-diagnostic, we can consider circulating tumor DNA to assess for any targetable mutation   Normocytic anemia -Likely secondary to anemia chronic disease -Hgb 10.1, stable -We will monitor for now  Malignant left pleural effusion  -S/p diagnostic thoracentesis x 2 for recurrent left pleural effusion  -Pleurx catheter placed on 11/01/2018 due to recurrent pleural effusion -Home health ordered for catheter drainage teaching and supplies; plan to drain the catheter at least once a week  Metastasis to the brain  -Patient is scheduled for SBRT on 11/08/2018 -She is currently asymptomatic -I counseled the patient on any concerning symptoms, including new onset or persistent headache, vision change, diplopia, or unilateral extremity weakness/numbness/tingling, for which she should contact the clinic promptly  Cancer-related pain -Primarily involving the left pectoralis muscle due to tumor invasion -Currently on MS-Contin 389mBID with oxycodone 1064m6hrs PRN, as well as gabapentin   -Pain relatively well controlled -I have increase gabapentin dose to 600m38mD, and continue MS-Contin with PRN oxycodone  for  breakthrough pain   Family history of malignancy  -Genetic evaluation pending for pathologic mutations  Goals of care discussion -I discussed with the patient's family that the treatment for metastatic breast cancer is for palliative intent only, not curative -Patient expressed understanding  Orders Placed This Encounter  Procedures  . CBC with Differential (Cancer Center Only)    Standing Status:   Standing    Number of Occurrences:   20    Standing Expiration Date:   11/03/2019  . CMP ([facility] only)    Standing Status:   Standing    Number of Occurrences:   20    Standing Expiration Date:   11/03/2019  . PHYSICIAN COMMUNICATION ORDER    A baseline Echo/[place] should be obtained prior to initiation of Herceptin, at 3, 6, 9 months during Herceptin  Treatment.   All questions were answered. The patient knows to call the clinic with any problems, questions or concerns. No barriers to learning was detected.  A total of more than 45 minutes were spent face-to-face with the patient during this encounter and over half of that time was spent on counseling and coordination of care as outlined above.  Chemo education on 11/07/2018. Return on 11/30/2018 at the start of Cycle 2 of THP for labs, port flush, and clinic appt.   Tish Men, MD 11/03/2018 3:42 PM  CHIEF COMPLAINT: "I am doing okay"  INTERVAL HISTORY: Ms. Lasecki returns to clinic for follow-up of metastatic breast cancer.  Patient reports that she has little soreness around the Pleurx catheter site, and since placement, she has not yet drained the pleural effusion.  She is still has intermittent dyspnea, mostly with exertion and changing body position, but overall it is unchanged.  She also reports periodic coughing with occasional yellow sputum production [place], but denies any increasing sputum production, change in sputum color, hemoptysis, chest pain, fever, or worsening shortness of breath.  Since starting radiation to the left  breast, she feels that the discomfort in the left breast is slightly less, as she is taking MS Contin, gabapentin, with breakthrough oxycodone (1-2 times per day).  Overall, her pain is relatively well controlled.  She denies any other complaint.  SUMMARY OF ONCOLOGIC HISTORY:   Malignant neoplasm of overlapping sites of breast ([place])   09/30/2018 Imaging    CT chest with contrast (at [facility]): Impressions: 1.  Dermal thickening of the multiple masses within the left breast as well as a invasive 6 cm mass of the left pectoralis major, compatible with primary breast cancer. 2.  Numerous pulmonary metastases.  Left axillary, retropectoral, supraclavicular, and upper mediastinal metastatic lymphadenopathy.  Mildly enlarged right axillary and supraclavicular lymph nodes may be metastatic. 3.  Indeterminant subcentimeter lucency in the dome of the right liver. 4.  Small left pleural effusion.    10/10/2018 Initial Diagnosis    Breast cancer ([place])    10/17/2018 Imaging    CTA chest: IMPRESSION: 1. No evidence of lobar or more central pulmonary embolus. Nondiagnostic segmental assessment. 2. Moderate left pleural effusion, increased in size from the prior CT with increasing left lung atelectasis. 3. Unchanged appearance of multiple left breast masses including an invasive mass involving the pectoralis major. 4. Unchanged left axillary, left subpectoral, and mediastinal lymphadenopathy. 5. Mild interval enlargement of multiple lung metastases and of right axillary lymph nodes.    10/18/2018 Imaging    MRI brain w/o and w/ contrast: Single 4 mm focus of enhancement within right frontal  white matter probably representing metastatic disease. Attention at follow-up is recommended.    10/19/2018 Imaging    CT abdomen/pelvis w/ contrast: IMPRESSION: 1. No CT evidence of metastatic disease involving the abdomen or pelvis. 2. Left pleural effusion associated atelectasis consolidation  with numerous pulmonary masses and nodules on partially imaged. Skin thickening of the left breast. Findings are in keeping with advanced breast malignancy and as seen on recent CT of the chest, 10/17/2018.    10/19/2018 Procedure    US-guided bx of left supraclavicular LN     10/19/2018 Pathology Results    Accession: [id number]  Lymph node, needle/core biopsy, left supraclavicular - METASTATIC CARCINOMA    11/09/2018 -  Chemotherapy    The patient had pegfilgrastim-cbqv (UDENYCA) injection 6 mg, 6 mg, Subcutaneous, Once, 0 of 6 cycles trastuzumab (HERCEPTIN) 966 mg in sodium chloride 0.9 % 250 mL chemo infusion, 8 mg/kg = 966 mg, Intravenous,  Once, 0 of 6 cycles DOCEtaxel (TAXOTERE) 170 mg in sodium chloride 0.9 % 250 mL chemo infusion, 75 mg/m2 = 170 mg, Intravenous,  Once, 0 of 6 cycles pertuzumab (PERJETA) 840 mg in sodium chloride 0.9 % 250 mL chemo infusion, 840 mg, Intravenous, Once, 0 of 6 cycles  for chemotherapy treatment.      REVIEW OF SYSTEMS:   Constitutional: ( - ) fevers, ( - )  chills , ( - ) night sweats Eyes: ( - ) blurriness of vision, ( - ) double vision, ( - ) watery eyes Ears, nose, mouth, throat, and face: ( - ) mucositis, ( - ) sore throat Respiratory: ( + ) cough, ( + ) dyspnea, ( - ) wheezes Cardiovascular: ( - ) palpitation, ( - ) chest discomfort, ( - ) lower extremity swelling Gastrointestinal:  ( - ) nausea, ( - ) heartburn, ( - ) change in bowel habits Skin: ( - ) abnormal skin rashes Lymphatics: ( - ) new lymphadenopathy, ( - ) easy bruising Neurological: ( - ) numbness, ( - ) tingling, ( - ) new weaknesses Behavioral/Psych: ( - ) mood change, ( - ) new changes  All other systems were reviewed with the patient and are negative.  I have reviewed the past medical history, past surgical history, social history and family history with the patient and they are unchanged from previous note.  ALLERGIES:  is allergic to aspirin.  MEDICATIONS:  Current  Outpatient Medications  Medication Sig Dispense Refill  . gabapentin (NEURONTIN) 300 MG capsule Take 1 capsule (300 mg total) by mouth 2 (two) times daily for 30 days. 60 capsule 3  . morphine (MS CONTIN) 30 MG 12 hr tablet Take 1 tablet (30 mg total) by mouth every 12 (twelve) hours for 30 days. 60 tablet 0  . oxyCODONE (OXY IR/ROXICODONE) 5 MG immediate release tablet Take 2 tablets (10 mg total) by mouth every 6 (six) hours as needed for up to 30 days for severe pain. 90 tablet 0  . dexamethasone (DECADRON) 4 MG tablet Take 2 tablets (8 mg total) by mouth 2 (two) times daily. Start the day before Taxotere. Then daily after chemo for 2 days. 30 tablet 4  . lidocaine-prilocaine (EMLA) cream Apply to affected area once 30 g 3  . LORazepam (ATIVAN) 0.5 MG tablet Take 0.5 tablets (0.25 mg total) by mouth once as needed for up to 2 doses for anxiety (Prior to PET and MRI scan). (Patient not taking: Reported on 11/03/2018) 1 tablet 0  . LORazepam (ATIVAN) 0.5 MG tablet  Take 1 tablet (0.5 mg total) by mouth every 6 (six) hours as needed (Nausea or vomiting). 30 tablet 0  . ondansetron (ZOFRAN) 8 MG tablet Take 1 tablet (8 mg total) by mouth 2 (two) times daily as needed for refractory nausea / vomiting. 30 tablet 4  . prochlorperazine (COMPAZINE) 10 MG tablet Take 1 tablet (10 mg total) by mouth every 6 (six) hours as needed (Nausea or vomiting). 30 tablet 4   No current facility-administered medications for this visit.     PHYSICAL EXAMINATION: ECOG PERFORMANCE STATUS: 1 - Symptomatic but completely ambulatory  Today's Vitals   11/03/18 1400 11/03/18 1458  BP:  (!) 120/52  Pulse:  (!) 115  Resp:  17  Temp:  98 F (36.7 C)  TempSrc:  Oral  SpO2:  (!) 89%  Weight:  260 lb 6.4 oz (118.1 kg)  Height:  5' 4"  (1.626 m)  PainSc: 0-No pain 0-No pain   Body mass index is 44.7 kg/m.  Filed Weights   11/03/18 1458  Weight: 260 lb 6.4 oz (118.1 kg)    GENERAL: alert, no distress and  comfortable SKIN: skin color, texture, turgor are normal, no rashes or significant lesions EYES: conjunctiva are pink and non-injected, sclera clear OROPHARYNX: no exudate, no erythema; lips, buccal mucosa, and tongue normal  NECK: supple, non-tender LUNGS: decreased breath sounds in the left lung base, otherwise lungs clear to auscultation, non-labored breathing on supplemental oxygen HEART: regular rate & rhythm and no murmurs and no lower extremity edema ABDOMEN: soft, non-tender, non-distended, normal bowel sounds Musculoskeletal: no cyanosis of digits and no clubbing  PSYCH: alert & oriented x 3, fluent speech NEURO: no focal motor/sensory deficits  LABORATORY DATA:  I have reviewed the data as listed    Component Value Date/Time   NA 136 11/03/2018 1445   K 4.6 11/03/2018 1445   CL 99 11/03/2018 1445   CO2 29 11/03/2018 1445   GLUCOSE 91 11/03/2018 1445   BUN 9 11/03/2018 1445   CREATININE 0.70 11/03/2018 1445   CALCIUM 8.6 (L) 11/03/2018 1445   PROT 6.9 11/03/2018 1445   ALBUMIN 3.2 (L) 11/03/2018 1445   AST 26 11/03/2018 1445   ALT 18 11/03/2018 1445   ALKPHOS 66 11/03/2018 1445   BILITOT 0.4 11/03/2018 1445   GFRNONAA >60 11/03/2018 1445   GFRAA >60 11/03/2018 1445    No results found for: SPEP, UPEP  Lab Results  Component Value Date   WBC 5.2 11/03/2018   NEUTROABS 4.4 11/03/2018   HGB 10.1 (L) 11/03/2018   HCT 32.7 (L) 11/03/2018   MCV 89.3 11/03/2018   PLT 439 (H) 11/03/2018      Chemistry      Component Value Date/Time   NA 136 11/03/2018 1445   K 4.6 11/03/2018 1445   CL 99 11/03/2018 1445   CO2 29 11/03/2018 1445   BUN 9 11/03/2018 1445   CREATININE 0.70 11/03/2018 1445      Component Value Date/Time   CALCIUM 8.6 (L) 11/03/2018 1445   ALKPHOS 66 11/03/2018 1445   AST 26 11/03/2018 1445   ALT 18 11/03/2018 1445   BILITOT 0.4 11/03/2018 1445       RADIOGRAPHIC STUDIES: I have personally reviewed the radiological images as listed below  and agreed with the findings in the report. Dg Chest 1 View  Result Date: 10/27/2018 CLINICAL DATA:  Left thoracentesis EXAM: CHEST  1 VIEW COMPARISON:  10/20/2018 FINDINGS: There is no pneumothorax after left thoracentesis. Bilateral  lung masses and low lung volumes persist. Right jugular Port-A-Cath is stable. Normal heart size. IMPRESSION: No pneumothorax post left thoracentesis. Electronically Signed   By: Marybelle Killings M.D.   On: 10/27/2018 12:32   Dg Chest 1 View  Result Date: 10/18/2018 CLINICAL DATA:  Status post left-sided thoracentesis EXAM: CHEST  1 VIEW COMPARISON:  10/17/2018 FINDINGS: Cardiac shadows within normal limits. Significant reduction in left pleural effusion is noted following thoracentesis. No pneumothorax is noted. Minimal residual fluid remains. Multiple pulmonary metastatic lesions are noted. No acute bony abnormality is seen. IMPRESSION: Stable pulmonary metastatic disease. Improving left-sided effusion following thoracentesis. No pneumothorax is noted. Electronically Signed   By: Inez Catalina M.D.   On: 10/18/2018 11:51   Dg Chest 2 View  Result Date: 10/24/2018 CLINICAL DATA:  Cough and congestion. Shortness of breath. History of breast cancer. EXAM: CHEST - 2 VIEW COMPARISON:  No prior. FINDINGS: PowerPort catheter with tip over right atrium. Mild cardiomegaly. No pulmonary venous congestion. Innumerable bilateral large pulmonary nodular densities are noted most consistent metastatic disease. Infectious etiologies can not be completely excluded. Left base atelectasis and infiltrate. Moderate left pleural effusion. No pneumothorax. IMPRESSION: 1. Innumerable bilateral large pulmonary nodular densities most consistent with metastatic disease 2. Left base atelectasis/infiltrate and moderate left pleural effusion. 3. Cardiomegaly. No pulmonary venous congestion. PowerPort catheter noted with tip over right atrium. Electronically Signed   By: Marcello Moores  Register   On: 10/24/2018  15:29   Dg Chest 2 View  Result Date: 10/20/2018 CLINICAL DATA:  43 year old female with mild chest wall tenderness this morning. Port catheter placement yesterday. EXAM: CHEST - 2 VIEW COMPARISON:  Prior chest x-ray 10/18/2018 FINDINGS: New right IJ approach single-lumen power injectable port catheter. The catheter appears to be in good position with the tip overlying the cavoatrial junction. Stable cardiac and mediastinal contours. Multiple cannonball pulmonary nodules bilaterally consistent with suspected metastatic breast cancer. There is marked enlargement of the left breast shadow relative to the right consistent with known underlying breast mass. No acute osseous abnormality. IMPRESSION: 1. Well-positioned right IJ approach single-lumen power injectable port catheter. 2. Imaging findings suggest left breast cancer with multifocal pulmonary metastatic disease. Electronically Signed   By: Jacqulynn Cadet M.D.   On: 10/20/2018 14:03   Dg Chest 2 View  Result Date: 10/17/2018 CLINICAL DATA:  43 year old female with breast cancer and shortness of breath requiring rapid response. EXAM: CHEST - 2 VIEW COMPARISON:  [facility] chest CT 09/30/2018. FINDINGS: Numerous rounded bilateral pulmonary metastases redemonstrated with superimposed small left pleural effusion. Mildly decreased left lung volume and decreased left lung ventilation compared to the February CT. No superimposed pneumothorax or pulmonary edema. Stable cardiac size and mediastinal contours. Visualized tracheal air column is within normal limits. Negative visible bowel gas pattern. Asymmetric left breast enlargement redemonstrated. No osseous lesion is evident. IMPRESSION: Extensive pulmonary metastatic disease with small left pleural effusion and worsening left lung base ventilation since the February CT. Electronically Signed   By: Genevie Ann M.D.   On: 10/17/2018 17:51   Ct Angio Chest Pe W And/or Wo Contrast  Result Date:  10/17/2018 CLINICAL DATA:  Cough and shortness of breath. Recently diagnosed breast cancer. EXAM: CT ANGIOGRAPHY CHEST WITH CONTRAST TECHNIQUE: Multidetector CT imaging of the chest was performed using the standard protocol during bolus administration of intravenous contrast. Multiplanar CT image reconstructions and MIPs were obtained to evaluate the vascular anatomy. CONTRAST:  146m OMNIPAQUE IOHEXOL 350 MG/ML SOLN COMPARISON:  09/30/2018 FINDINGS:  Cardiovascular: No lobar or more central pulmonary embolus is identified. Segmental assessment is limited by motion artifact and patient body habitus. The heart is normal in size. There is no pericardial effusion. The thoracic aorta is normal in size. Mediastinum/Nodes: Again partially visualized are multiple left breast masses with left greater than right breast skin thickening. A 6 x 6 cm mass invading the left pectoralis major muscle is unchanged, and there is a similar appearance of diffuse regional edema. Multiple enlarged left axillary and subpectoral lymph nodes are again partially visualized measuring up to approximately 4 cm in long axis. Partially visualized left supraclavicular lymph nodes are similar to the prior CT. Partially visualized right axillary lymph nodes appear mildly larger than on the prior CT and measure up to 1.5 cm in short axis. Enlarged mediastinal lymph nodes have not significantly changed with the largest measuring 1.9 cm in short axis in the pre-vascular region. Lungs/Pleura: A moderate-sized left pleural effusion has enlarged from the prior CT with increasing atelectasis in the left lower lobe and to a lesser extent lingula. There is the suggestion of mild underlying pleural thickening, however a discrete pleural mass is not identified. Numerous bilateral lung nodules are again seen with multiple demonstrating a mild interval increase in size including a 3.3 x 3.1 cm left upper lobe mass (series 6, image 48, previously 3.1 x 3.0 cm) and  a 2.0 cm right upper lobe nodule (series 6, image 50, previously 1.7 cm). Motion artifact and the enlarging left pleural effusion and atelectasis obscure some nodules. Upper Abdomen: Subcentimeter hypodensity in the hepatic dome on the prior CT is obscured by motion artifact on today's study. Musculoskeletal: No acute osseous abnormality or suspicious osseous lesion. Review of the MIP images confirms the above findings. IMPRESSION: 1. No evidence of lobar or more central pulmonary embolus. Nondiagnostic segmental assessment. 2. Moderate left pleural effusion, increased in size from the prior CT with increasing left lung atelectasis. 3. Unchanged appearance of multiple left breast masses including an invasive mass involving the pectoralis major. 4. Unchanged left axillary, left subpectoral, and mediastinal lymphadenopathy. 5. Mild interval enlargement of multiple lung metastases and of right axillary lymph nodes. Electronically Signed   By: Logan Bores M.D.   On: 10/17/2018 20:05   Mr Jeri Cos FU Contrast  Result Date: 10/30/2018 CLINICAL DATA:  Metastatic breast cancer. Recent discovery of a right frontal metastasis. S RS planning. EXAM: MRI HEAD WITHOUT AND WITH CONTRAST TECHNIQUE: Multiplanar, multiecho pulse sequences of the brain and surrounding structures were obtained without and with intravenous contrast. CONTRAST:  49m MULTIHANCE GADOBENATE DIMEGLUMINE 529 MG/ML IV SOLN COMPARISON:  10/18/2018 FINDINGS: Brain: Today's study suffers from some motion degradation. No change is appreciated compared to the previous study. Brain has normal appearance with exception of a 4 mm metastasis in the right frontal white matter, without mass effect or edema. No second lesion. No hydrocephalus or extra-axial collection. No skull or skull base lesion is seen. Vascular: Major vessels at the base of the brain show flow. Skull and upper cervical spine: Negative Sinuses/Orbits: Clear/normal Other: None IMPRESSION: No change  since the previous study. Single 4 mm metastasis in the right frontal white matter. Axial image 100, series 13. Electronically Signed   By: MNelson ChimesM.D.   On: 10/30/2018 20:52   Mr BJeri CosWXNContrast  Result Date: 10/19/2018 CLINICAL DATA:  43y/o F; Breast cancer, initial staging, clinical stage IV. EXAM: MRI HEAD WITHOUT AND WITH CONTRAST TECHNIQUE: Multiplanar, multiecho pulse  sequences of the brain and surrounding structures were obtained without and with intravenous contrast. CONTRAST:  10 cc Gadavist. COMPARISON:  None. FINDINGS: Brain: 4 mm focus of enhancement within the right frontal white matter (series 23, image 37 and series 17, image 17 and series 5, image 85). No additional abnormal enhancement in the brain is identified. No findings of acute stroke, hemorrhage, extra-axial collection, hydrocephalus, or herniation. No additional structural or signal abnormality of the brain is evident. Vascular: Normal flow voids. Skull and upper cervical spine: Normal marrow signal. Sinuses/Orbits: Negative. Other: None. IMPRESSION: Single 4 mm focus of enhancement within right frontal white matter probably representing metastatic disease. Attention at follow-up is recommended. Electronically Signed   By: Kristine Garbe M.D.   On: 10/19/2018 02:43   Ct Abdomen Pelvis W Contrast  Result Date: 10/19/2018 CLINICAL DATA:  Staging EXAM: CT ABDOMEN AND PELVIS WITH CONTRAST TECHNIQUE: Multidetector CT imaging of the abdomen and pelvis was performed using the standard protocol following bolus administration of intravenous contrast. CONTRAST:  163m OMNIPAQUE IOHEXOL 300 MG/ML  SOLN COMPARISON:  CT chest, 10/17/2018 FINDINGS: Lower chest: Left pleural effusion associated atelectasis consolidation with numerous pulmonary masses and nodules on partially imaged. Skin thickening of the left breast. Hepatobiliary: No focal liver abnormality is seen. No gallstones, gallbladder wall thickening, or biliary  dilatation. Pancreas: Unremarkable. No pancreatic ductal dilatation or surrounding inflammatory changes. Spleen: Normal in size without focal abnormality. Adrenals/Urinary Tract: Adrenal glands are unremarkable. Kidneys are normal, without renal calculi, focal lesion, or hydronephrosis. Bladder is unremarkable. Stomach/Bowel: Stomach is within normal limits. Appendix appears normal. No evidence of bowel wall thickening, distention, or inflammatory changes. Vascular/Lymphatic: No significant vascular findings are present. No enlarged abdominal or pelvic lymph nodes. Reproductive: No mass or other abnormality. Other: No abdominal wall hernia or abnormality. No abdominopelvic ascites. Musculoskeletal: No acute or significant osseous findings. IMPRESSION: 1. No CT evidence of metastatic disease involving the abdomen or pelvis. 2. Left pleural effusion associated atelectasis consolidation with numerous pulmonary masses and nodules on partially imaged. Skin thickening of the left breast. Findings are in keeping with advanced breast malignancy and as seen on recent CT of the chest, 10/17/2018. Electronically Signed   By: AEddie CandleM.D.   On: 10/19/2018 14:09   Ir [place]Guide Bx Asp/drain  Addendum Date: 10/19/2018   ADDENDUM REPORT: 10/19/2018 16:58 ADDENDUM: Concurrently performed procedures were inadvertently linked. Following successful placement of the port catheter, attention was turned to the left supraclavicular region which was sterilely prepped and draped in the standard fashion using chlorhexidine skin prep. Ultrasound was used to interrogate the neck. A large supraclavicular lymph node was successfully identified. A suitable skin entry site was selected and marked. Local anesthesia was attained by infiltration with 1 percent lidocaine. A small dermatotomy was made. Under real-time sonographic guidance, multiple 18 gauge core biopsies were obtained of lymph node using the Bard mission biopsy device. Biopsy  specimens were placed in formalin and delivered to pathology for further analysis. Post biopsy ultrasound imaging demonstrates no evidence of complication. IMPRESSION: Technically successful ultrasound-guided core biopsy of left supraclavicular lymph node. Electronically Signed   By: HJacqulynn CadetM.D.   On: 10/19/2018 16:58   Result Date: 10/19/2018 INDICATION: 43year old female with advanced left breast cancer. She presents for port catheter placement to initiate chemotherapy. EXAM: IMPLANTED PORT A CATH PLACEMENT WITH ULTRASOUND AND FLUOROSCOPIC GUIDANCE MEDICATIONS: 3 g Ancef; The antibiotic was administered within an appropriate time interval prior to skin puncture. ANESTHESIA/SEDATION: Versed 1  mg IV; Fentanyl 75 mcg IV; Moderate Sedation Time:  22 minutes The patient was continuously monitored during the procedure by the interventional radiology nurse under my direct supervision. FLUOROSCOPY TIME:  0 minutes, 12 seconds (12 mGy) COMPLICATIONS: None immediate. PROCEDURE: The right neck and chest was prepped with chlorhexidine, and draped in the usual sterile fashion using maximum barrier technique (cap and mask, sterile gown, sterile gloves, large sterile sheet, hand hygiene and cutaneous antiseptic). Local anesthesia was attained by infiltration with 1% lidocaine with epinephrine. Ultrasound demonstrated patency of the right internal jugular vein, and this was documented with an image. Under real-time ultrasound guidance, this vein was accessed with a 21 gauge micropuncture needle and image documentation was performed. A small dermatotomy was made at the access site with an 11 scalpel. A 0.018" wire was advanced into the SVC and the access needle exchanged for a 24F micropuncture vascular sheath. The 0.018" wire was then removed and a 0.035" wire advanced into the IVC. An appropriate location for the subcutaneous reservoir was selected below the clavicle and an incision was made through the skin and  underlying soft tissues. The subcutaneous tissues were then dissected using a combination of blunt and sharp surgical technique and a pocket was formed. A single lumen power injectable portacatheter was then tunneled through the subcutaneous tissues from the pocket to the dermatotomy and the port reservoir placed within the subcutaneous pocket. The venous access site was then serially dilated and a peel away vascular sheath placed over the wire. The wire was removed and the port catheter advanced into position under fluoroscopic guidance. The catheter tip is positioned in the superior cavoatrial junction. This was documented with a spot image. The portacatheter was then tested and found to flush and aspirate well. The port was flushed with saline followed by 100 units/mL heparinized saline. The pocket was then closed in two layers using first subdermal inverted interrupted absorbable sutures followed by a running subcuticular suture. The epidermis was then sealed with Dermabond. The dermatotomy at the venous access site was also closed with Dermabond. IMPRESSION: Successful placement of a right IJ approach Power Port with ultrasound and fluoroscopic guidance. The catheter is ready for use. Electronically Signed: By: Jacqulynn Cadet M.D. On: 10/19/2018 16:48   Dg Chest Port 1 View  Result Date: 11/01/2018 CLINICAL DATA:  Left PleurX catheter placement. Metastatic breast cancer EXAM: PORTABLE CHEST 1 VIEW COMPARISON:  10/27/2018 FINDINGS: Multiple pulmonary nodules compatible with widespread metastatic disease. PleurX catheter in the left lung base not fully evaluated due to large patient size and enlargement of the left breast due to tumor. No pneumothorax. Port-A-Cath tip SVC/RA junction. Marked enlargement of left axilla and left breast soft tissues due to tumor IMPRESSION: PleurX catheter left lung base. Tip of the catheter difficult to see due to overlying metastatic disease. No pneumothorax. Electronically  Signed   By: Franchot Gallo M.D.   On: 11/01/2018 16:03   Ir Imaging Guided Port Insertion  Addendum Date: 10/19/2018   ADDENDUM REPORT: 10/19/2018 16:58 ADDENDUM: Concurrently performed procedures were inadvertently linked. Following successful placement of the port catheter, attention was turned to the left supraclavicular region which was sterilely prepped and draped in the standard fashion using chlorhexidine skin prep. Ultrasound was used to interrogate the neck. A large supraclavicular lymph node was successfully identified. A suitable skin entry site was selected and marked. Local anesthesia was attained by infiltration with 1 percent lidocaine. A small dermatotomy was made. Under real-time sonographic guidance, multiple 18  gauge core biopsies were obtained of lymph node using the Bard mission biopsy device. Biopsy specimens were placed in formalin and delivered to pathology for further analysis. Post biopsy ultrasound imaging demonstrates no evidence of complication. IMPRESSION: Technically successful ultrasound-guided core biopsy of left supraclavicular lymph node. Electronically Signed   By: Jacqulynn Cadet M.D.   On: 10/19/2018 16:58   Result Date: 10/19/2018 INDICATION: 43 year old female with advanced left breast cancer. She presents for port catheter placement to initiate chemotherapy. EXAM: IMPLANTED PORT A CATH PLACEMENT WITH ULTRASOUND AND FLUOROSCOPIC GUIDANCE MEDICATIONS: 3 g Ancef; The antibiotic was administered within an appropriate time interval prior to skin puncture. ANESTHESIA/SEDATION: Versed 1 mg IV; Fentanyl 75 mcg IV; Moderate Sedation Time:  22 minutes The patient was continuously monitored during the procedure by the interventional radiology nurse under my direct supervision. FLUOROSCOPY TIME:  0 minutes, 12 seconds (12 mGy) COMPLICATIONS: None immediate. PROCEDURE: The right neck and chest was prepped with chlorhexidine, and draped in the usual sterile fashion using maximum  barrier technique (cap and mask, sterile gown, sterile gloves, large sterile sheet, hand hygiene and cutaneous antiseptic). Local anesthesia was attained by infiltration with 1% lidocaine with epinephrine. Ultrasound demonstrated patency of the right internal jugular vein, and this was documented with an image. Under real-time ultrasound guidance, this vein was accessed with a 21 gauge micropuncture needle and image documentation was performed. A small dermatotomy was made at the access site with an 11 scalpel. A 0.018" wire was advanced into the SVC and the access needle exchanged for a 20F micropuncture vascular sheath. The 0.018" wire was then removed and a 0.035" wire advanced into the IVC. An appropriate location for the subcutaneous reservoir was selected below the clavicle and an incision was made through the skin and underlying soft tissues. The subcutaneous tissues were then dissected using a combination of blunt and sharp surgical technique and a pocket was formed. A single lumen power injectable portacatheter was then tunneled through the subcutaneous tissues from the pocket to the dermatotomy and the port reservoir placed within the subcutaneous pocket. The venous access site was then serially dilated and a peel away vascular sheath placed over the wire. The wire was removed and the port catheter advanced into position under fluoroscopic guidance. The catheter tip is positioned in the superior cavoatrial junction. This was documented with a spot image. The portacatheter was then tested and found to flush and aspirate well. The port was flushed with saline followed by 100 units/mL heparinized saline. The pocket was then closed in two layers using first subdermal inverted interrupted absorbable sutures followed by a running subcuticular suture. The epidermis was then sealed with Dermabond. The dermatotomy at the venous access site was also closed with Dermabond. IMPRESSION: Successful placement of a right  IJ approach Power Port with ultrasound and fluoroscopic guidance. The catheter is ready for use. Electronically Signed: By: Jacqulynn Cadet M.D. On: 10/19/2018 16:48   Ir Perc Pleural Drain W/indwell Cath W/img Guide  Result Date: 11/01/2018 INDICATION: 43 year old with metastatic breast cancer and recurrent left pleural effusion. EXAM: PLACEMENT A TUNNELED LEFT PLEURAL CATHETER WITH ULTRASOUND AND FLUOROSCOPIC GUIDANCE MEDICATIONS: Ancef 2 g ANESTHESIA/SEDATION: Fentanyl 100 mcg IV; Versed 4.0 mg IV Moderate Sedation Time:  24 minutes The patient was continuously monitored during the procedure by the interventional radiology nurse under my direct supervision. FLUOROSCOPY TIME:  24 seconds, 27 mGy COMPLICATIONS: None immediate. PROCEDURE: Informed written consent was obtained from the patient after a thorough discussion of the procedural  risks, benefits and alternatives. All questions were addressed. Maximal Sterile Barrier Technique was utilized including caps, mask, sterile gowns, sterile gloves, sterile drape, hand hygiene and skin antiseptic. A timeout was performed prior to the initiation of the procedure. Ultrasound demonstrated left pleural fluid. The left lower chest was prepped and draped in sterile fashion. Skin was anesthetized at 2 areas and 2 small incisions were made. PleurX catheter was tunneled between these 2 incisions. Cuff was placed underneath the skin. At the more lateral incision, a Yueh catheter was directed into the left pleural fluid using ultrasound guidance and clear yellow fluid was aspirated. A stiff Amplatz wire was advanced into the pleural space. Wire position was confirmed with fluoroscopy. Tract was dilated to accommodate the peel-away sheath. Catheter was cut and slightly shortened. Catheter was advanced through the peel-away sheath and easily directed into the pleural space. Catheter was attached to vacuum canister and approximately 1 L of yellow fluid was removed. The  patient had significant coughing during removal of the pleural fluid. The pleural skin entrance site was closed using absorbable suture and Dermabond. The catheter was sutured to skin with Prolene suture. Dressings placed over the catheter and incision. Fluoroscopic and ultrasound images were taken and saved for documentation. FINDINGS: Pleural catheter successfully placed in the left pleural space. 1 L of fluid was removed. IMPRESSION: Successful placement of a tunneled left pleural catheter with ultrasound and fluoroscopic guidance. Electronically Signed   By: Markus Daft M.D.   On: 11/01/2018 16:27   US Thoracentesis Asp Pleural Space W/img Guide  Result Date: 10/27/2018 INDICATION: Patient with history of metastatic breast cancer, dyspnea, recurrent left pleural effusion. Request made for therapeutic left thoracentesis. EXAM: ULTRASOUND GUIDED THERAPEUTIC LEFT THORACENTESIS MEDICATIONS: None COMPLICATIONS: None immediate. PROCEDURE: An ultrasound guided thoracentesis was thoroughly discussed with the patient and questions answered. The benefits, risks, alternatives and complications were also discussed. The patient understands and wishes to proceed with the procedure. Written consent was obtained. Ultrasound was performed to localize and mark an adequate pocket of fluid in the left chest. The area was then prepped and draped in the normal sterile fashion. 1% Lidocaine was used for local anesthesia. Under ultrasound guidance a 6 Fr Safe-T-Centesis catheter was introduced. Thoracentesis was performed. The catheter was removed and a dressing applied. FINDINGS: A total of approximately 1.2 liters of hazy, yellow fluid was removed. IMPRESSION: Successful ultrasound guided therapeutic left thoracentesis yielding 1.2 liters of pleural fluid. Read by: Rowe Robert, PA-C Electronically Signed   By: Sandi Mariscal M.D.   On: 10/27/2018 14:03   US Thoracentesis Asp Pleural Space W/img Guide  Result Date:  10/18/2018 INDICATION: Patient with history of presumed stage IV metastatic breast cancer with dyspnea and left pleural effusion; request made for diagnostic and therapeutic left thoracentesis. EXAM: ULTRASOUND GUIDED DIAGNOSTIC AND THERAPEUTIC LEFT THORACENTESIS MEDICATIONS: None COMPLICATIONS: None immediate. PROCEDURE: An ultrasound guided thoracentesis was thoroughly discussed with the patient and questions answered. The benefits, risks, alternatives and complications were also discussed. The patient understands and wishes to proceed with the procedure. Written consent was obtained. Ultrasound was performed to localize and mark an adequate pocket of fluid in the left chest. The area was then prepped and draped in the normal sterile fashion. 1% Lidocaine was used for local anesthesia. Under ultrasound guidance a 6 Fr Safe-T-Centesis catheter was introduced. Thoracentesis was performed. The catheter was removed and a dressing applied. FINDINGS: A total of approximately 1.2 liters of hazy, yellow fluid was removed. Samples were sent  to the laboratory as requested by the clinical team. IMPRESSION: Successful ultrasound guided diagnostic and therapeutic left thoracentesis yielding 1.2 liters of pleural fluid. Read by: Rowe Robert, PA-C Electronically Signed   By: Markus Daft M.D.   On: 10/18/2018 12:03

## 2018-11-03 NOTE — Progress Notes (Signed)
START ON PATHWAY REGIMEN - Breast     A cycle is every 21 days:     Pertuzumab      Pertuzumab      Trastuzumab-xxxx      Trastuzumab-xxxx      Docetaxel   **Always confirm dose/schedule in your pharmacy ordering system**  Patient Characteristics: Distant Metastases or Locoregional Recurrent Disease - Unresected or Locally Advanced Unresectable Disease Progressing after Neoadjuvant and Local Therapies, HER2 Positive, ER Negative/Unknown, Chemotherapy, First Line Therapeutic Status: Distant Metastases BRCA Mutation Status: Awaiting Test Results ER Status: Negative (-) HER2 Status: Positive (+) PR Status: Negative (-) Line of Therapy: First Line Intent of Therapy: Non-Curative / Palliative Intent, Discussed with Patient

## 2018-11-05 NOTE — Progress Notes (Signed)
Patient came in today to discuss starting treatment next week. Patient had pleurex catheter placed this week. She feels okay, but does state that breathing is better. She appears more comfortable than when seen last, and she doesn't seem to be 'struggling to breath' as she appeared last visit.  She has paperwork for FMLA. Placed in [place] bin.  She asks about the order for Home Health to provide her with portable oxygen tanks. Called [facility] to follow up. They are unable to find the order. I sent it again. They also stated that portable tanks are on backorder and there will be a delay getting one to patient - she is aware of this.  Patient will start treatment next Thursday. Confirmed an appointment for chemo education with JoEllen on Tuesday. Introduced patient to [facility] and gave her a short tour of the infusion room.  She will finished radiation to the breast and brain next Tuesday. She states she doesn't really have many side effects from this, but she also doesn't notice any improvement in the tumor burden to her left chest.  Patient had an order form for [facility] supplies. Dr Maylon Peppers completed the physician section. Erline Levine RN, the desk nurse completed the remaining sections and faxed to [facility].  Patient had pleurex placed Wednesday and had 1L fluid removed at that time. She bring today a drainage set, and her mother, who she hopes we can help talk though draining the tube and changing the dressing in the office today. 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 Patient and myself walked her mother through the steps to both drainage and dressing change. Patient drained 700cc of straw colored fluid. She ended up in significant pain after the procedure. Reccommended that she take her pain medicine about 45 minutes before draining, and that she only drain 500cc the next time to hopefully have better  tolerance.  At this time patient will drain every other day until her output decreases. New dressing placed by mother with support.   Will follow up with patient after next Tuesday's chemo ed appointment to see if patient has any additional needs for Thursday.

## 2018-11-06 ENCOUNTER — Other Ambulatory Visit: Payer: Self-pay

## 2018-11-06 ENCOUNTER — Ambulatory Visit
Admission: RE | Admit: 2018-11-06 | Discharge: 2018-11-06 | Disposition: A | Discharge status: Home / Self Care | Payer: BLUE CROSS/BLUE SHIELD | Source: Ambulatory Visit | Attending: Radiation Oncology | Admitting: Radiation Oncology

## 2018-11-06 DIAGNOSIS — C50812 Malignant neoplasm of overlapping sites of left female breast: Secondary | ICD-10-CM | POA: Diagnosis not present

## 2018-11-06 DIAGNOSIS — Z510 Encounter for antineoplastic radiation therapy: Secondary | ICD-10-CM | POA: Diagnosis not present

## 2018-11-06 DIAGNOSIS — C7931 Secondary malignant neoplasm of brain: Secondary | ICD-10-CM | POA: Diagnosis not present

## 2018-11-07 ENCOUNTER — Ambulatory Visit
Admission: RE | Admit: 2018-11-07 | Discharge: 2018-11-07 | Disposition: A | Discharge status: Home / Self Care | Payer: BLUE CROSS/BLUE SHIELD | Source: Ambulatory Visit | Attending: Radiation Oncology | Admitting: Radiation Oncology

## 2018-11-07 ENCOUNTER — Inpatient Hospital Stay: Payer: BLUE CROSS/BLUE SHIELD

## 2018-11-07 ENCOUNTER — Encounter: Payer: Self-pay | Admitting: Radiation Oncology

## 2018-11-07 ENCOUNTER — Other Ambulatory Visit: Payer: Self-pay

## 2018-11-07 ENCOUNTER — Encounter: Payer: Self-pay | Admitting: *Deleted

## 2018-11-07 DIAGNOSIS — Z510 Encounter for antineoplastic radiation therapy: Secondary | ICD-10-CM | POA: Diagnosis not present

## 2018-11-07 DIAGNOSIS — C50812 Malignant neoplasm of overlapping sites of left female breast: Secondary | ICD-10-CM | POA: Diagnosis not present

## 2018-11-07 DIAGNOSIS — C7931 Secondary malignant neoplasm of brain: Secondary | ICD-10-CM | POA: Diagnosis not present

## 2018-11-08 ENCOUNTER — Other Ambulatory Visit: Payer: Self-pay

## 2018-11-08 ENCOUNTER — Ambulatory Visit
Admission: RE | Admit: 2018-11-08 | Discharge: 2018-11-08 | Disposition: A | Discharge status: Home / Self Care | Payer: BLUE CROSS/BLUE SHIELD | Source: Ambulatory Visit | Attending: Radiation Oncology | Admitting: Radiation Oncology

## 2018-11-08 ENCOUNTER — Encounter: Payer: Self-pay | Admitting: Radiation Oncology

## 2018-11-08 DIAGNOSIS — Z510 Encounter for antineoplastic radiation therapy: Secondary | ICD-10-CM | POA: Insufficient documentation

## 2018-11-08 DIAGNOSIS — C7931 Secondary malignant neoplasm of brain: Secondary | ICD-10-CM | POA: Insufficient documentation

## 2018-11-08 NOTE — Op Note (Signed)
Stereotactic Radiosurgery Operative Note  Name: Sharon Floyd MRN: [id number]  Date: 11/08/2018  DOB: 07/19/1976  Op Note  Pre Operative Diagnosis:  Metastatic breast cancer with brain metastasis  Post Operative Diagnois:  Metastatic breast cancer with brain metastasis  3D TREATMENT PLANNING AND DOSIMETRY:  The patient's radiation plan was reviewed and approved by myself (neurosurgery) and Dr. Kyung Rudd (radiation oncology) prior to treatment.  It showed 3-dimensional radiation distributions overlaid onto the planning CT/MRI image set.  [facility] for the target structures as well as the organs at risk were reviewed. The documentation of the 3D plan and dosimetry are filed in the radiation oncology EMR.  NARRATIVE:  Sharon Floyd was brought to the [employer] stereotactic radiation treatment machine and placed supine on the CT couch. The head frame was applied, and the patient was set up for stereotactic radiosurgery.  SIMULATION VERIFICATION:  In the couch zero-angle position, the patient underwent Exactrac imaging using the Brainlab system with orthogonal KV images.  These were carefully aligned and repeated to confirm treatment position for each of the isocenters.  The Exactrac snap film verification was repeated at each couch angle.  SPECIAL TREATMENT PROCEDURE: Sharon Floyd received stereotactic radiosurgery to the following target: Right frontal target was treated using 3 Dynamic Conformal Arcs to a prescription dose of 20 Gy.  ExacTrac registration was performed for each couch angle.  The 100% isodose line was prescribed.  STEREOTACTIC TREATMENT MANAGEMENT:  Following delivery, the patient was transported to nursing in stable condition and monitored for possible acute effects.  Vital signs were recorded. The patient tolerated treatment without significant acute effects, and was discharged to home in stable condition.    PLAN: Follow-up in one month with Dr. Lisbeth Renshaw.

## 2018-11-09 ENCOUNTER — Encounter (HOSPITAL_COMMUNITY): Payer: Self-pay | Admitting: Hematology

## 2018-11-09 ENCOUNTER — Other Ambulatory Visit: Payer: Self-pay

## 2018-11-09 ENCOUNTER — Encounter: Payer: Self-pay | Admitting: *Deleted

## 2018-11-09 ENCOUNTER — Inpatient Hospital Stay: Payer: BLUE CROSS/BLUE SHIELD | Attending: Hematology

## 2018-11-09 ENCOUNTER — Inpatient Hospital Stay: Payer: BLUE CROSS/BLUE SHIELD

## 2018-11-09 ENCOUNTER — Other Ambulatory Visit: Payer: Self-pay | Admitting: *Deleted

## 2018-11-09 VITALS — BP 103/67 | HR 76 | Temp 98.4°F | Resp 17

## 2018-11-09 DIAGNOSIS — C50812 Malignant neoplasm of overlapping sites of left female breast: Secondary | ICD-10-CM

## 2018-11-09 DIAGNOSIS — G893 Neoplasm related pain (acute) (chronic): Secondary | ICD-10-CM | POA: Diagnosis not present

## 2018-11-09 DIAGNOSIS — D72829 Elevated white blood cell count, unspecified: Secondary | ICD-10-CM | POA: Diagnosis not present

## 2018-11-09 DIAGNOSIS — R7989 Other specified abnormal findings of blood chemistry: Secondary | ICD-10-CM | POA: Diagnosis not present

## 2018-11-09 DIAGNOSIS — C7931 Secondary malignant neoplasm of brain: Secondary | ICD-10-CM | POA: Diagnosis not present

## 2018-11-09 DIAGNOSIS — Z171 Estrogen receptor negative status [ER-]: Secondary | ICD-10-CM

## 2018-11-09 DIAGNOSIS — D638 Anemia in other chronic diseases classified elsewhere: Secondary | ICD-10-CM | POA: Insufficient documentation

## 2018-11-09 DIAGNOSIS — Z79899 Other long term (current) drug therapy: Secondary | ICD-10-CM | POA: Insufficient documentation

## 2018-11-09 DIAGNOSIS — Z5111 Encounter for antineoplastic chemotherapy: Secondary | ICD-10-CM | POA: Insufficient documentation

## 2018-11-09 DIAGNOSIS — J910 Malignant pleural effusion: Secondary | ICD-10-CM | POA: Insufficient documentation

## 2018-11-09 DIAGNOSIS — C7800 Secondary malignant neoplasm of unspecified lung: Secondary | ICD-10-CM | POA: Insufficient documentation

## 2018-11-09 LAB — CBC WITH DIFFERENTIAL (CANCER CENTER ONLY)
Abs Immature Granulocytes: 0.05 10*3/uL (ref 0.00–0.07)
Basophils Absolute: 0 10*3/uL (ref 0.0–0.1)
Basophils Relative: 0 %
Eosinophils Absolute: 0 10*3/uL (ref 0.0–0.5)
Eosinophils Relative: 0 %
HCT: 33.4 % — ABNORMAL LOW (ref 36.0–46.0)
Hemoglobin: 10.5 g/dL — ABNORMAL LOW (ref 12.0–15.0)
Immature Granulocytes: 1 %
Lymphocytes Relative: 3 %
Lymphs Abs: 0.1 10*3/uL — ABNORMAL LOW (ref 0.7–4.0)
MCH: 27.9 pg (ref 26.0–34.0)
MCHC: 31.4 g/dL (ref 30.0–36.0)
MCV: 88.6 fL (ref 80.0–100.0)
Monocytes Absolute: 0.6 10*3/uL (ref 0.1–1.0)
Monocytes Relative: 13 %
Neutro Abs: 4 10*3/uL (ref 1.7–7.7)
Neutrophils Relative %: 83 %
Platelet Count: 441 10*3/uL — ABNORMAL HIGH (ref 150–400)
RBC: 3.77 MIL/uL — ABNORMAL LOW (ref 3.87–5.11)
RDW: 13 % (ref 11.5–15.5)
WBC Count: 4.8 10*3/uL (ref 4.0–10.5)
nRBC: 0 % (ref 0.0–0.2)

## 2018-11-09 LAB — CMP (CANCER CENTER ONLY)
ALT: 16 U/L (ref 0–44)
AST: 21 U/L (ref 15–41)
Albumin: 3.5 g/dL (ref 3.5–5.0)
Alkaline Phosphatase: 64 U/L (ref 38–126)
Anion gap: 9 (ref 5–15)
BUN: 11 mg/dL (ref 6–20)
CO2: 28 mmol/L (ref 22–32)
Calcium: 9.2 mg/dL (ref 8.9–10.3)
Chloride: 100 mmol/L (ref 98–111)
Creatinine: 0.79 mg/dL (ref 0.44–1.00)
GFR, Est AFR Am: >60 mL/min (ref >60)
GFR, Estimated: >60 mL/min (ref >60)
Glucose, Bld: 111 mg/dL — ABNORMAL HIGH (ref 70–99)
Potassium: 4.1 mmol/L (ref 3.5–5.1)
Sodium: 137 mmol/L (ref 135–145)
Total Bilirubin: 0.4 mg/dL (ref 0.3–1.2)
Total Protein: 7.1 g/dL (ref 6.5–8.1)

## 2018-11-09 LAB — MAGNESIUM: Magnesium: 2.2 mg/dL (ref 1.7–2.4)

## 2018-11-09 MED ORDER — HEPARIN SOD (PORK) LOCK FLUSH 100 UNIT/ML IV SOLN
500.0000 [IU] | Freq: Once | INTRAVENOUS | Status: AC | PRN
  Administered 2018-11-09: 500 [IU]
  Filled 2018-11-09: qty 5

## 2018-11-09 MED ORDER — TRASTUZUMAB CHEMO 150 MG IV SOLR
900.0000 mg | Freq: Once | INTRAVENOUS | Status: AC
  Administered 2018-11-09: 900 mg via INTRAVENOUS
  Filled 2018-11-09: qty 42.9

## 2018-11-09 MED ORDER — DEXAMETHASONE SODIUM PHOSPHATE 10 MG/ML IJ SOLN
10.0000 mg | Freq: Once | INTRAMUSCULAR | Status: AC
  Administered 2018-11-09: 10 mg via INTRAVENOUS

## 2018-11-09 MED ORDER — ACETAMINOPHEN 325 MG PO TABS
650.0000 mg | ORAL_TABLET | Freq: Once | ORAL | Status: AC
  Administered 2018-11-09: 650 mg via ORAL

## 2018-11-09 MED ORDER — SODIUM CHLORIDE 0.9 % IV SOLN
Freq: Once | INTRAVENOUS | Status: AC
  Administered 2018-11-09: 10:00:00 via INTRAVENOUS
  Filled 2018-11-09: qty 250

## 2018-11-09 MED ORDER — ACETAMINOPHEN 325 MG PO TABS
ORAL_TABLET | ORAL | Status: AC
  Filled 2018-11-09: qty 2

## 2018-11-09 MED ORDER — SODIUM CHLORIDE 0.9% FLUSH
10.0000 mL | INTRAVENOUS | Status: DC | PRN
  Administered 2018-11-09: 10 mL
  Filled 2018-11-09: qty 10

## 2018-11-09 MED ORDER — SODIUM CHLORIDE 0.9 % IV SOLN
840.0000 mg | Freq: Once | INTRAVENOUS | Status: AC
  Administered 2018-11-09: 840 mg via INTRAVENOUS
  Filled 2018-11-09: qty 28

## 2018-11-09 MED ORDER — SODIUM CHLORIDE 0.9 % IV SOLN
75.0000 mg/m2 | Freq: Once | INTRAVENOUS | Status: AC
  Administered 2018-11-09: 170 mg via INTRAVENOUS
  Filled 2018-11-09: qty 17

## 2018-11-09 MED ORDER — DIPHENHYDRAMINE HCL 25 MG PO CAPS
ORAL_CAPSULE | ORAL | Status: AC
  Filled 2018-11-09: qty 2

## 2018-11-09 MED ORDER — DEXAMETHASONE SODIUM PHOSPHATE 10 MG/ML IJ SOLN
INTRAMUSCULAR | Status: AC
  Filled 2018-11-09: qty 1

## 2018-11-09 MED ORDER — DIPHENHYDRAMINE HCL 25 MG PO CAPS
50.0000 mg | ORAL_CAPSULE | Freq: Once | ORAL | Status: AC
  Administered 2018-11-09: 50 mg via ORAL

## 2018-11-09 MED ORDER — SODIUM CHLORIDE 0.9% FLUSH
10.0000 mL | Freq: Once | INTRAVENOUS | Status: DC
  Filled 2018-11-09: qty 10

## 2018-11-09 MED ORDER — GABAPENTIN 300 MG PO CAPS: 600.0000 mg | ORAL_CAPSULE | Freq: Two times a day (BID) | ORAL | 5 refills | Status: DC

## 2018-11-09 NOTE — Patient Instructions (Signed)
Docetaxel injection What is this medicine? DOCETAXEL (doe se TAX el) is a chemotherapy drug. It targets fast dividing cells, like cancer cells, and causes these cells to die. This medicine is used to treat many types of cancers like breast cancer, certain stomach cancers, head and neck cancer, lung cancer, and prostate cancer. This medicine may be used for other purposes; ask your health care provider or pharmacist if you have questions. COMMON BRAND NAME(S): Docefrez, Taxotere What should I tell my health care provider before I take this medicine? They need to know if you have any of these conditions: -infection (especially a virus infection such as chickenpox, cold sores, or herpes) -liver disease -low blood counts, like low white cell, platelet, or red cell counts -an unusual or allergic reaction to docetaxel, polysorbate 80, other chemotherapy agents, other medicines, foods, dyes, or preservatives -pregnant or trying to get pregnant -breast-feeding How should I use this medicine? This drug is given as an infusion into a vein. It is administered in a hospital or clinic by a specially trained health care professional. Talk to your pediatrician regarding the use of this medicine in children. Special care may be needed. Overdosage: If you think you have taken too much of this medicine contact a poison control center or emergency room at once. NOTE: This medicine is only for you. Do not share this medicine with others. What if I miss a dose? It is important not to miss your dose. Call your doctor or health care professional if you are unable to keep an appointment. What may interact with this medicine? -cyclosporine -erythromycin -ketoconazole -medicines to increase blood counts like filgrastim, pegfilgrastim, sargramostim -vaccines Talk to your doctor or health care professional before taking any of these medicines: -acetaminophen -aspirin -ibuprofen -ketoprofen -naproxen This list  may not describe all possible interactions. Give your health care provider a list of all the medicines, herbs, non-prescription drugs, or dietary supplements you use. Also tell them if you smoke, drink alcohol, or use illegal drugs. Some items may interact with your medicine. What should I watch for while using this medicine? Your condition will be monitored carefully while you are receiving this medicine. You will need important blood work done while you are taking this medicine. This drug may make you feel generally unwell. This is not uncommon, as chemotherapy can affect healthy cells as well as cancer cells. Report any side effects. Continue your course of treatment even though you feel ill unless your doctor tells you to stop. In some cases, you may be given additional medicines to help with side effects. Follow all directions for their use. Call your doctor or health care professional for advice if you get a fever, chills or sore throat, or other symptoms of a cold or flu. Do not treat yourself. This drug decreases your body's ability to fight infections. Try to avoid being around people who are sick. This medicine may increase your risk to bruise or bleed. Call your doctor or health care professional if you notice any unusual bleeding. This medicine may contain alcohol in the product. You may get drowsy or dizzy. Do not drive, use machinery, or do anything that needs mental alertness until you know how this medicine affects you. Do not stand or sit up quickly, especially if you are an older patient. This reduces the risk of dizzy or fainting spells. Avoid alcoholic drinks. Do not become pregnant while taking this medicine or for 6 months after stopping it. Women should inform their doctor  if they wish to become pregnant or think they might be pregnant. Men should not father a child while taking this medicine and for 3 months after stopping it. There is a potential for serious side effects to an unborn  child. Talk to your health care professional or pharmacist for more information. Do not breast-feed an infant while taking this medicine or for 2 weeks after stopping it. This may interfere with the ability to father a child. You should talk to your doctor or health care professional if you are concerned about your fertility. What side effects may I notice from receiving this medicine? Side effects that you should report to your doctor or health care professional as soon as possible: -allergic reactions like skin rash, itching or hives, swelling of the face, lips, or tongue -low blood counts - This drug may decrease the number of white blood cells, red blood cells and platelets. You may be at increased risk for infections and bleeding. -signs of infection - fever or chills, cough, sore throat, pain or difficulty passing urine -signs of decreased platelets or bleeding - bruising, pinpoint red spots on the skin, black, tarry stools, nosebleeds -signs of decreased red blood cells - unusually weak or tired, fainting spells, lightheadedness -breathing problems -fast or irregular heartbeat -low blood pressure -mouth sores -nausea and vomiting -pain, swelling, redness or irritation at the injection site -pain, tingling, numbness in the hands or feet -swelling of the ankle, feet, hands -weight gain Side effects that usually do not require medical attention (report to your doctor or health care professional if they continue or are bothersome): -bone pain -complete hair loss including hair on your head, underarms, pubic hair, eyebrows, and eyelashes -diarrhea -excessive tearing -changes in the color of fingernails -loosening of the fingernails -nausea -muscle pain -red flush to skin -sweating -weak or tired This list may not describe all possible side effects. Call your doctor for medical advice about side effects. You may report side effects to FDA at 1-800-FDA-1088. Where should I keep my  medicine? This drug is given in a hospital or clinic and will not be stored at home. NOTE: This sheet is a summary. It may not cover all possible information. If you have questions about this medicine, talk to your doctor, pharmacist, or health care provider.  2019 [employer]/Gold Standard (2017-08-22 12:07:21) Dexamethasone injection What is this medicine? DEXAMETHASONE (dex a METH a sone) is a corticosteroid. It is used to treat inflammation of the skin, joints, lungs, and other organs. Common conditions treated include asthma, allergies, and arthritis. It is also used for other conditions, like blood disorders and diseases of the adrenal glands. This medicine may be used for other purposes; ask your health care provider or pharmacist if you have questions. COMMON BRAND NAME(S): Decadron, DoubleDex, Simplist Dexamethasone, Solurex What should I tell my health care provider before I take this medicine? They need to know if you have any of these conditions: -blood clotting problems -Cushing's syndrome -diabetes -glaucoma -heart problems or disease -high blood pressure -infection like herpes, measles, tuberculosis, or chickenpox -kidney disease -liver disease -mental problems -myasthenia gravis -osteoporosis -previous heart attack -seizures -stomach, ulcer or intestine disease including colitis and diverticulitis -thyroid problem -an unusual or allergic reaction to dexamethasone, corticosteroids, other medicines, lactose, foods, dyes, or preservatives -pregnant or trying to get pregnant -breast-feeding How should I use this medicine? This medicine is for injection into a muscle, joint, lesion, soft tissue, or vein. It is given by a health care professional  in a hospital or clinic setting. Talk to your pediatrician regarding the use of this medicine in children. Special care may be needed. Overdosage: If you think you have taken too much of this medicine contact a poison control center  or emergency room at once. NOTE: This medicine is only for you. Do not share this medicine with others. What if I miss a dose? This may not apply. If you are having a series of injections over a prolonged period, try not to miss an appointment. Call your doctor or health care professional to reschedule if you are unable to keep an appointment. What may interact with this medicine? Do not take this medicine with any of the following medications: -mifepristone, RU-486 -vaccines This medicine may also interact with the following medications: -amphotericin B -antibiotics like clarithromycin, erythromycin, and troleandomycin -aspirin and aspirin-like drugs -barbiturates like phenobarbital -carbamazepine -cholestyramine -cholinesterase inhibitors like donepezil, galantamine, rivastigmine, and tacrine -cyclosporine -digoxin -diuretics -ephedrine -female hormones, like estrogens or progestins and birth control pills -indinavir -isoniazid -ketoconazole -medicines for diabetes -medicines that improve muscle tone or strength for conditions like myasthenia gravis -NSAIDs, medicines for pain and inflammation, like ibuprofen or naproxen -phenytoin -rifampin -thalidomide -warfarin This list may not describe all possible interactions. Give your health care provider a list of all the medicines, herbs, non-prescription drugs, or dietary supplements you use. Also tell them if you smoke, drink alcohol, or use illegal drugs. Some items may interact with your medicine. What should I watch for while using this medicine? Your condition will be monitored carefully while you are receiving this medicine. If you are taking this medicine for a long time, carry an identification card with your name and address, the type and dose of your medicine, and your doctor's name and address. This medicine may increase your risk of getting an infection. Stay away from people who are sick. Tell your doctor or health care  professional if you are around anyone with measles or chickenpox. Talk to your health care provider before you get any vaccines that you take this medicine. If you are going to have surgery, tell your doctor or health care professional that you have taken this medicine within the last twelve months. Ask your doctor or health care professional about your diet. You may need to lower the amount of salt you eat. The medicine can increase your blood sugar. If you are a diabetic check with your doctor if you need help adjusting the dose of your diabetic medicine. What side effects may I notice from receiving this medicine? Side effects that you should report to your doctor or health care professional as soon as possible: -allergic reactions like skin rash, itching or hives, swelling of the face, lips, or tongue -black or tarry stools -change in the amount of urine -changes in vision -confusion, excitement, restlessness, a false sense of well-being -fever, sore throat, sneezing, cough, or other signs of infection, wounds that will not heal -hallucinations -increased thirst -mental depression, mood swings, mistaken feelings of self importance or of being mistreated -pain in hips, back, ribs, arms, shoulders, or legs -pain, redness, or irritation at the injection site -redness, blistering, peeling or loosening of the skin, including inside the mouth -rounding out of face -swelling of feet or lower legs -unusual bleeding or bruising -unusual tired or weak -wounds that do not heal Side effects that usually do not require medical attention (report to your doctor or health care professional if they continue or are bothersome): -diarrhea or constipation -change  in taste -headache -nausea, vomiting -skin problems, acne, thin and shiny skin -touble sleeping -unusual growth of hair on the face or body -weight gain This list may not describe all possible side effects. Call your doctor for medical  advice about side effects. You may report side effects to FDA at 1-800-FDA-1088. Where should I keep my medicine? This drug is given in a hospital or clinic and will not be stored at home. NOTE: This sheet is a summary. It may not cover all possible information. If you have questions about this medicine, talk to your doctor, pharmacist, or health care provider.  2019 Elsevier/Gold Standard (2007-11-16 14:04:12) Pertuzumab injection What is this medicine? PERTUZUMAB (per TOOZ ue mab) is a monoclonal antibody. It is used to treat breast cancer. This medicine may be used for other purposes; ask your health care provider or pharmacist if you have questions. COMMON BRAND NAME(S): PERJETA What should I tell my health care provider before I take this medicine? They need to know if you have any of these conditions: -heart disease -heart failure -high blood pressure -history of irregular heart beat -recent or ongoing radiation therapy -an unusual or allergic reaction to pertuzumab, other medicines, foods, dyes, or preservatives -pregnant or trying to get pregnant -breast-feeding How should I use this medicine? This medicine is for infusion into a vein. It is given by a health care professional in a hospital or clinic setting. Talk to your pediatrician regarding the use of this medicine in children. Special care may be needed. Overdosage: If you think you have taken too much of this medicine contact a poison control center or emergency room at once. NOTE: This medicine is only for you. Do not share this medicine with others. What if I miss a dose? It is important not to miss your dose. Call your doctor or health care professional if you are unable to keep an appointment. What may interact with this medicine? Interactions are not expected. Give your health care provider a list of all the medicines, herbs, non-prescription drugs, or dietary supplements you use. Also tell them if you smoke, drink  alcohol, or use illegal drugs. Some items may interact with your medicine. This list may not describe all possible interactions. Give your health care provider a list of all the medicines, herbs, non-prescription drugs, or dietary supplements you use. Also tell them if you smoke, drink alcohol, or use illegal drugs. Some items may interact with your medicine. What should I watch for while using this medicine? Your condition will be monitored carefully while you are receiving this medicine. Report any side effects. Continue your course of treatment even though you feel ill unless your doctor tells you to stop. Do not become pregnant while taking this medicine or for 7 months after stopping it. Women should inform their doctor if they wish to become pregnant or think they might be pregnant. Women of child-bearing potential will need to have a negative pregnancy test before starting this medicine. There is a potential for serious side effects to an unborn child. Talk to your health care professional or pharmacist for more information. Do not breast-feed an infant while taking this medicine or for 7 months after stopping it. Women must use effective birth control with this medicine. Call your doctor or health care professional for advice if you get a fever, chills or sore throat, or other symptoms of a cold or flu. Do not treat yourself. Try to avoid being around people who are sick. You may  experience fever, chills, and headache during the infusion. Report any side effects during the infusion to your health care professional. What side effects may I notice from receiving this medicine? Side effects that you should report to your doctor or health care professional as soon as possible: -breathing problems -chest pain or palpitations -dizziness -feeling faint or lightheaded -fever or chills -skin rash, itching or hives -sore throat -swelling of the face, lips, or tongue -swelling of the legs or  ankles -unusually weak or tired Side effects that usually do not require medical attention (report to your doctor or health care professional if they continue or are bothersome): -diarrhea -hair loss -nausea, vomiting -tiredness This list may not describe all possible side effects. Call your doctor for medical advice about side effects. You may report side effects to FDA at 1-800-FDA-1088. Where should I keep my medicine? This drug is given in a hospital or clinic and will not be stored at home. NOTE: This sheet is a summary. It may not cover all possible information. If you have questions about this medicine, talk to your doctor, pharmacist, or health care provider.  2019 Else[employer]/Gold Standard (2015-08-28 12:08:50) Trastuzumab injection for infusion What is this medicine? TRASTUZUMAB (tras TOO zoo mab) is a monoclonal antibody. It is used to treat breast cancer and stomach cancer. This medicine may be used for other purposes; ask your health care provider or pharmacist if you have questions. COMMON BRAND NAME(S): Herceptin, Calla Kicks, OGIVRI What should I tell my health care provider before I take this medicine? They need to know if you have any of these conditions: -heart disease -heart failure -lung or breathing disease, like asthma -an unusual or allergic reaction to trastuzumab, benzyl alcohol, or other medications, foods, dyes, or preservatives -pregnant or trying to get pregnant -breast-feeding How should I use this medicine? This drug is given as an infusion into a vein. It is administered in a hospital or clinic by a specially trained health care professional. Talk to your pediatrician regarding the use of this medicine in children. This medicine is not approved for use in children. Overdosage: If you think you have taken too much of this medicine contact a poison control center or emergency room at once. NOTE: This medicine is only for you. Do not share this medicine with  others. What if I miss a dose? It is important not to miss a dose. Call your doctor or health care professional if you are unable to keep an appointment. What may interact with this medicine? This medicine may interact with the following medications: -certain types of chemotherapy, such as daunorubicin, doxorubicin, epirubicin, and idarubicin This list may not describe all possible interactions. Give your health care provider a list of all the medicines, herbs, non-prescription drugs, or dietary supplements you use. Also tell them if you smoke, drink alcohol, or use illegal drugs. Some items may interact with your medicine. What should I watch for while using this medicine? Visit your doctor for checks on your progress. Report any side effects. Continue your course of treatment even though you feel ill unless your doctor tells you to stop. Call your doctor or health care professional for advice if you get a fever, chills or sore throat, or other symptoms of a cold or flu. Do not treat yourself. Try to avoid being around people who are sick. You may experience fever, chills and shaking during your first infusion. These effects are usually mild and can be treated with other medicines. Report any side  effects during the infusion to your health care professional. Fever and chills usually do not happen with later infusions. Do not become pregnant while taking this medicine or for 7 months after stopping it. Women should inform their doctor if they wish to become pregnant or think they might be pregnant. Women of child-bearing potential will need to have a negative pregnancy test before starting this medicine. There is a potential for serious side effects to an unborn child. Talk to your health care professional or pharmacist for more information. Do not breast-feed an infant while taking this medicine or for 7 months after stopping it. Women must use effective birth control with this medicine. What side  effects may I notice from receiving this medicine? Side effects that you should report to your doctor or health care professional as soon as possible: -allergic reactions like skin rash, itching or hives, swelling of the face, lips, or tongue -chest pain or palpitations -cough -dizziness -feeling faint or lightheaded, falls -fever -general ill feeling or flu-like symptoms -signs of worsening heart failure like breathing problems; swelling in your legs and feet -unusually weak or tired Side effects that usually do not require medical attention (report to your doctor or health care professional if they continue or are bothersome): -bone pain -changes in taste -diarrhea -joint pain -nausea/vomiting -weight loss This list may not describe all possible side effects. Call your doctor for medical advice about side effects. You may report side effects to FDA at 1-800-FDA-1088. Where should I keep my medicine? This drug is given in a hospital or clinic and will not be stored at home. NOTE: This sheet is a summary. It may not cover all possible information. If you have questions about this medicine, talk to your doctor, pharmacist, or health care provider.  2019 Elsevier/Gold Standard (2016-07-20 14:37:52)

## 2018-11-09 NOTE — Patient Instructions (Signed)

## 2018-11-09 NOTE — Progress Notes (Signed)
Per dr Maylon Peppers, okay to treat today with pulse of 115.

## 2018-11-09 NOTE — Progress Notes (Signed)
Patient here for first treatment. She has finished radiation. She is still draining her pleurex every other day and getting about 700cc.   She has no new needs today. We were able to talk about expectations in these coming days. She knows to call me with any needs or concerns.

## 2018-11-10 ENCOUNTER — Inpatient Hospital Stay: Payer: BLUE CROSS/BLUE SHIELD

## 2018-11-10 VITALS — BP 117/49 | HR 114 | Temp 98.2°F | Resp 24

## 2018-11-10 DIAGNOSIS — G893 Neoplasm related pain (acute) (chronic): Secondary | ICD-10-CM | POA: Diagnosis not present

## 2018-11-10 DIAGNOSIS — D638 Anemia in other chronic diseases classified elsewhere: Secondary | ICD-10-CM | POA: Diagnosis not present

## 2018-11-10 DIAGNOSIS — J910 Malignant pleural effusion: Secondary | ICD-10-CM | POA: Diagnosis not present

## 2018-11-10 DIAGNOSIS — D72829 Elevated white blood cell count, unspecified: Secondary | ICD-10-CM | POA: Diagnosis not present

## 2018-11-10 DIAGNOSIS — Z79899 Other long term (current) drug therapy: Secondary | ICD-10-CM | POA: Diagnosis not present

## 2018-11-10 DIAGNOSIS — C7931 Secondary malignant neoplasm of brain: Secondary | ICD-10-CM | POA: Diagnosis not present

## 2018-11-10 DIAGNOSIS — C50812 Malignant neoplasm of overlapping sites of left female breast: Secondary | ICD-10-CM | POA: Diagnosis not present

## 2018-11-10 DIAGNOSIS — Z5111 Encounter for antineoplastic chemotherapy: Secondary | ICD-10-CM | POA: Diagnosis not present

## 2018-11-10 DIAGNOSIS — R7989 Other specified abnormal findings of blood chemistry: Secondary | ICD-10-CM | POA: Diagnosis not present

## 2018-11-10 DIAGNOSIS — C7800 Secondary malignant neoplasm of unspecified lung: Secondary | ICD-10-CM | POA: Diagnosis not present

## 2018-11-10 DIAGNOSIS — Z171 Estrogen receptor negative status [ER-]: Principal | ICD-10-CM

## 2018-11-10 MED ORDER — PEGFILGRASTIM-CBQV 6 MG/0.6ML ~~LOC~~ SOSY
PREFILLED_SYRINGE | SUBCUTANEOUS | Status: AC
  Filled 2018-11-10: qty 0.6

## 2018-11-10 MED ORDER — PEGFILGRASTIM-CBQV 6 MG/0.6ML ~~LOC~~ SOSY
6.0000 mg | PREFILLED_SYRINGE | Freq: Once | SUBCUTANEOUS | Status: AC
  Administered 2018-11-10: 6 mg via SUBCUTANEOUS

## 2018-11-10 NOTE — Patient Instructions (Signed)
Pegfilgrastim injection  What is this medicine?  PEGFILGRASTIM (PEG fil gra stim) is a long-acting granulocyte colony-stimulating factor that stimulates the growth of neutrophils, a type of white blood cell important in the body's fight against infection. It is used to reduce the incidence of fever and infection in patients with certain types of cancer who are receiving chemotherapy that affects the bone marrow, and to increase survival after being exposed to high doses of radiation.  This medicine may be used for other purposes; ask your health care provider or pharmacist if you have questions.  COMMON BRAND NAME(S): Fulphila, Neulasta, UDENYCA  What should I tell my health care provider before I take this medicine?  They need to know if you have any of these conditions:  -kidney disease  -latex allergy  -ongoing radiation therapy  -sickle cell disease  -skin reactions to acrylic adhesives (On-Body Injector only)  -an unusual or allergic reaction to pegfilgrastim, filgrastim, other medicines, foods, dyes, or preservatives  -pregnant or trying to get pregnant  -breast-feeding  How should I use this medicine?  This medicine is for injection under the skin. If you get this medicine at home, you will be taught how to prepare and give the pre-filled syringe or how to use the On-body Injector. Refer to the patient Instructions for Use for detailed instructions. Use exactly as directed. Tell your healthcare provider immediately if you suspect that the On-body Injector may not have performed as intended or if you suspect the use of the On-body Injector resulted in a missed or partial dose.  It is important that you put your used needles and syringes in a special sharps container. Do not put them in a trash can. If you do not have a sharps container, call your pharmacist or healthcare provider to get one.  Talk to your pediatrician regarding the use of this medicine in children. While this drug may be prescribed for  selected conditions, precautions do apply.  Overdosage: If you think you have taken too much of this medicine contact a poison control center or emergency room at once.  NOTE: This medicine is only for you. Do not share this medicine with others.  What if I miss a dose?  It is important not to miss your dose. Call your doctor or health care professional if you miss your dose. If you miss a dose due to an On-body Injector failure or leakage, a new dose should be administered as soon as possible using a single prefilled syringe for manual use.  What may interact with this medicine?  Interactions have not been studied.  Give your health care provider a list of all the medicines, herbs, non-prescription drugs, or dietary supplements you use. Also tell them if you smoke, drink alcohol, or use illegal drugs. Some items may interact with your medicine.  This list may not describe all possible interactions. Give your health care provider a list of all the medicines, herbs, non-prescription drugs, or dietary supplements you use. Also tell them if you smoke, drink alcohol, or use illegal drugs. Some items may interact with your medicine.  What should I watch for while using this medicine?  You may need blood work done while you are taking this medicine.  If you are going to need a MRI, CT scan, or other procedure, tell your doctor that you are using this medicine (On-Body Injector only).  What side effects may I notice from receiving this medicine?  Side effects that you should report to   your doctor or health care professional as soon as possible:  -allergic reactions like skin rash, itching or hives, swelling of the face, lips, or tongue  -back pain  -dizziness  -fever  -pain, redness, or irritation at site where injected  -pinpoint red spots on the skin  -red or dark-brown urine  -shortness of breath or breathing problems  -stomach or side pain, or pain at the shoulder  -swelling  -tiredness  -trouble passing urine or  change in the amount of urine  Side effects that usually do not require medical attention (report to your doctor or health care professional if they continue or are bothersome):  -bone pain  -muscle pain  This list may not describe all possible side effects. Call your doctor for medical advice about side effects. You may report side effects to FDA at 1-800-FDA-1088.  Where should I keep my medicine?  Keep out of the reach of children.  If you are using this medicine at home, you will be instructed on how to store it. Throw away any unused medicine after the expiration date on the label.  NOTE: This sheet is a summary. It may not cover all possible information. If you have questions about this medicine, talk to your doctor, pharmacist, or health care provider.   2019 Elsevier/Gold Standard (2017-10-31 16:57:08)

## 2018-11-13 ENCOUNTER — Encounter (HOSPITAL_COMMUNITY): Payer: Self-pay | Admitting: Hematology

## 2018-11-15 ENCOUNTER — Telehealth: Payer: Self-pay | Admitting: Licensed Clinical Social Worker

## 2018-11-15 NOTE — Telephone Encounter (Signed)
Revealed negative genetic testing.  Revealed that 2 VUS's were identified: in the BRCA1 gene and ATM gene.This normal result is reassuring and indicates that it is unlikely Sharon Floyd's cancer is due to a hereditary cause.  It is unlikely that there is an increased risk of another cancer due to a mutation in one of these genes.  However, genetic testing is not perfect, and cannot definitively rule out a hereditary cause.  It will be important for her to keep in contact with genetics to learn if any additional testing may be needed in the future.

## 2018-11-16 ENCOUNTER — Encounter: Payer: Self-pay | Admitting: Licensed Clinical Social Worker

## 2018-11-16 ENCOUNTER — Ambulatory Visit: Payer: Self-pay | Admitting: Licensed Clinical Social Worker

## 2018-11-16 DIAGNOSIS — Z1379 Encounter for other screening for genetic and chromosomal anomalies: Secondary | ICD-10-CM | POA: Insufficient documentation

## 2018-11-16 NOTE — Progress Notes (Signed)
HPI:  Sharon Floyd was previously seen in the [facility] due to a personal and family history of cancer and concerns regarding a hereditary predisposition to cancer. Please refer to our prior cancer genetics clinic note for more information regarding our discussion, assessment and recommendations, at the time. Sharon Floyd recent genetic test results were disclosed to her, as were recommendations warranted by these results. These results and recommendations are discussed in more detail below.  CANCER HISTORY:    Malignant neoplasm of overlapping sites of breast ([place])   09/30/2018 Imaging    CT chest with contrast (at [facility]): Impressions: 1.  Dermal thickening of the multiple masses within the left breast as well as a invasive 6 cm mass of the left pectoralis major, compatible with primary breast cancer. 2.  Numerous pulmonary metastases.  Left axillary, retropectoral, supraclavicular, and upper mediastinal metastatic lymphadenopathy.  Mildly enlarged right axillary and supraclavicular lymph nodes may be metastatic. 3.  Indeterminant subcentimeter lucency in the dome of the right liver. 4.  Small left pleural effusion.    10/10/2018 Initial Diagnosis    Breast cancer ([place])    10/17/2018 Imaging    CTA chest: IMPRESSION: 1. No evidence of lobar or more central pulmonary embolus. Nondiagnostic segmental assessment. 2. Moderate left pleural effusion, increased in size from the prior CT with increasing left lung atelectasis. 3. Unchanged appearance of multiple left breast masses including an invasive mass involving the pectoralis major. 4. Unchanged left axillary, left subpectoral, and mediastinal lymphadenopathy. 5. Mild interval enlargement of multiple lung metastases and of right axillary lymph nodes.    10/18/2018 Imaging    MRI brain w/o and w/ contrast: Single 4 mm focus of enhancement within right frontal white matter probably representing metastatic  disease. Attention at follow-up is recommended.    10/19/2018 Imaging    CT abdomen/pelvis w/ contrast: IMPRESSION: 1. No CT evidence of metastatic disease involving the abdomen or pelvis. 2. Left pleural effusion associated atelectasis consolidation with numerous pulmonary masses and nodules on partially imaged. Skin thickening of the left breast. Findings are in keeping with advanced breast malignancy and as seen on recent CT of the chest, 10/17/2018.    10/19/2018 Procedure    US-guided bx of left supraclavicular LN     10/19/2018 Pathology Results    Accession: [id number]  Lymph node, needle/core biopsy, left supraclavicular - METASTATIC CARCINOMA    11/09/2018 -  Chemotherapy    The patient had pegfilgrastim-cbqv (UDENYCA) injection 6 mg, 6 mg, Subcutaneous, Once, 1 of 6 cycles Administration: 6 mg (11/10/2018) trastuzumab (HERCEPTIN) 900 mg in sodium chloride 0.9 % 250 mL chemo infusion, 966 mg, Intravenous,  Once, 1 of 6 cycles Administration: 900 mg (11/09/2018) DOCEtaxel (TAXOTERE) 170 mg in sodium chloride 0.9 % 250 mL chemo infusion, 75 mg/m2 = 170 mg, Intravenous,  Once, 1 of 6 cycles Administration: 170 mg (11/09/2018) pertuzumab (PERJETA) 840 mg in sodium chloride 0.9 % 250 mL chemo infusion, 840 mg, Intravenous, Once, 1 of 6 cycles Administration: 840 mg (11/09/2018)  for chemotherapy treatment.      FAMILY HISTORY:  We obtained a detailed, 4-generation family history.  Significant diagnoses are listed below: Family History  Problem Relation Age of Onset  . Arthritis Mother   . Diabetes Father   . Hypertension Father   . Arthritis Father   . Heart disease Father   . Early death Maternal Grandmother   . Asthma Maternal Grandfather   . Heart disease Maternal Grandfather   .  Migraines Maternal Grandfather   . Early death Maternal Grandfather   . Asthma Paternal Grandmother   . Hypertension Paternal Grandmother   . Asthma Paternal Grandfather   . Hypertension  Paternal Grandfather   . Breast cancer Other        paternal cousin, female  . Colon cancer Cousin        paternal  . Breast cancer Cousin        paternal  . Lung cancer Other     Sharon Floyd has one son, age 59. She does not have biological siblings.  Sharon Floyd mother is living at 71. Sharon Floyd had 3 maternal uncles, 1 maternal aunt. Her uncles have all passed away, no cancer history. No information is known about her aunt. No cancers in her maternal cousins that she is aware of. Her maternal grandmother died at 21 due to a brain hemorrhage, grandfather died at 29 due to heart issues.  Sharon Floyd father is living at 108. He does not have siblings. Sharon Floyd maternal grandmother died in her late 24s-70s. Sharon Floyd maternal grandfather died in his late 61s-70s. He had a brother who had lung cancer with history of smoking. He also had a sister whose daughter had colon cancer. This relative with colon cancer had a son, Sharon Floyd's second cousin, with breast cancer recently diagnosed in his 100s. Sharon Floyd has another second cousin once removed who had breast cancer.  Sharon Floyd is unaware of previous family history of genetic testing for hereditary cancer risks. Patient's maternal ancestors are of [place] descent, and paternal ancestors are of African American/Caucasian descent. There is no reported Ashkenazi Jewish ancestry. There is no known consanguinity.   GENETIC TEST RESULTS: Genetic testing reported out on 11/15/2018 through the [facility] Common Hereditary cancer panel found no pathogenic mutations.  The Common Hereditary Cancers Panel offered by [facility] includes sequencing and/or deletion duplication testing of the following 47 genes: APC, ATM, AXIN2, BARD1, BMPR1A, BRCA1, BRCA2, BRIP1, CDH1, CDKN2A (p14ARF), CDKN2A (p16INK4a), CKD4, CHEK2, CTNNA1, DICER1, EPCAM (Deletion/duplication testing only), GREM1 (promoter region deletion/duplication testing only), KIT, MEN1,  MLH1, MSH2, MSH3, MSH6, MUTYH, NBN, NF1, NHTL1, PALB2, PDGFRA, PMS2, POLD1, POLE, PTEN, RAD50, RAD51C, RAD51D, SDHB, SDHC, SDHD, SMAD4, SMARCA4. STK11, TP53, TSC1, TSC2, and VHL.  The following genes were evaluated for sequence changes only: SDHA and HOXB13 c.251G>A variant only.  The test report has been scanned into EPIC and is located under the Molecular Pathology section of the Results Review tab.  A portion of the result report is included below for reference.     We discussed with Sharon Floyd that because current genetic testing is not perfect, it is possible there may be a gene mutation in one of these genes that current testing cannot detect, but that chance is small.  We also discussed, that there could be another gene that has not yet been discovered, or that we have not yet tested, that is responsible for the cancer diagnoses in the family. It is also possible there is a hereditary cause for the cancer in the family that Sharon Floyd did not inherit and therefore was not identified in her testing.  Therefore, it is important to remain in touch with cancer genetics in the future so that we can continue to offer Sharon Floyd the most up to date genetic testing.   Genetic testing did identify 2 Variants of uncertain significance (VUS) - one in the ATM gene called c.6543G>T (p.Glu2181Asp) a second in the  BRCA1 gene called c.1802A>G (p.His601Arg). At this time, it is unknown if these variants are associated with increased cancer risk or if they are normal findings, but most variants such as these get reclassified to being inconsequential. They should not be used to make medical management decisions. With time, we suspect the lab will determine the significance of these variants, if any. If we do learn more about them, we will try to contact Sharon Floyd to discuss it further. However, it is important to stay in touch with [place] periodically and keep the address and phone number up to date.  ADDITIONAL GENETIC  TESTING: We discussed with Sharon Floyd that her genetic testing was fairly extensive.  If there are genes identified to increase cancer risk that can be analyzed in the future, we would be happy to discuss and coordinate this testing at that time.    CANCER SCREENING RECOMMENDATIONS: Sharon Floyd test result is considered negative (normal).  This means that we have not identified a hereditary cause for her personal and family history of cancer at this time. Most cancers happen by chance and this negative test suggests that her cancer may fall into this category.    While reassuring, this does not definitively rule out a hereditary predisposition to cancer. It is still possible that there could be genetic mutations that are undetectable by current technology. There could be genetic mutations in genes that have not been tested or identified to increase cancer risk.  Therefore, it is recommended she continue to follow the cancer management and screening guidelines provided by her oncology and primary healthcare provider.   An individual's cancer risk and medical management are not determined by genetic test results alone. Overall cancer risk assessment incorporates additional factors, including personal medical history, family history, and any available genetic information that may result in a personalized plan for cancer prevention and surveillance  RECOMMENDATIONS FOR FAMILY MEMBERS:  Relatives in this family might be at some increased risk of developing cancer, over the general population risk, simply due to the family history of cancer.  We recommended women in this family have a yearly mammogram beginning at age 63, or 41 years younger than the earliest onset of cancer, an annual clinical breast exam, and perform monthly breast self-exams. Women in this family should also have a gynecological exam as recommended by their primary provider. All family members should have a colonoscopy by age 31.  FOLLOW-UP:  Lastly, we discussed with Ms. Doepke that cancer genetics is a rapidly advancing field and it is possible that new genetic tests will be appropriate for her and/or her family members in the future. We encouraged her to remain in contact with cancer genetics on an annual basis so we can update her personal and family histories and let her know of advances in cancer genetics that may benefit this family.   Our contact number was provided. Ms. Escamilla questions were answered to her satisfaction, and she knows she is welcome to call us at anytime with additional questions or concerns.   Sharon Rogue, MS Genetic Counselor [email or web address] Phone: [phone]

## 2018-11-20 ENCOUNTER — Other Ambulatory Visit: Payer: Self-pay | Admitting: *Deleted

## 2018-11-20 ENCOUNTER — Encounter: Payer: Self-pay | Admitting: *Deleted

## 2018-11-20 DIAGNOSIS — C50812 Malignant neoplasm of overlapping sites of left female breast: Secondary | ICD-10-CM

## 2018-11-20 MED ORDER — OXYCODONE HCL 5 MG PO TABS: 10.0000 mg | ORAL_TABLET | Freq: Four times a day (QID) | ORAL | 0 refills | Status: DC | PRN

## 2018-11-20 MED ORDER — MORPHINE SULFATE ER 30 MG PO TBCR: 30.0000 mg | EXTENDED_RELEASE_TABLET | Freq: Two times a day (BID) | ORAL | 0 refills | Status: DC

## 2018-11-20 NOTE — Progress Notes (Signed)
Followed up with patient after her first treatment last week. She tolerated chemo well. She states no significant side effects. She does admit to change in taste. She is trying to keep up nutrition intake and adequate fluid intake. She states her breathing is slightly better, but she is having more coughing. In the morning her cough is productive. She is still on 3L oxygen.   She is only getting about 69ml from her Pleurx cath. She feels like she is getting empty with her drain. She had been doing every other day, but has started to move that out to every three or four days.  She is requesting some refills. Will send message to Dr Maylon Peppers. Patient is aware to reach out if needed.

## 2018-11-23 ENCOUNTER — Other Ambulatory Visit: Payer: Self-pay | Admitting: Radiation Oncology

## 2018-11-23 ENCOUNTER — Telehealth: Payer: Self-pay

## 2018-11-23 ENCOUNTER — Telehealth: Payer: Self-pay | Admitting: *Deleted

## 2018-11-23 MED ORDER — SILVER SULFADIAZINE 1 % EX CREA: 1.0000 "application " | TOPICAL_CREAM | Freq: Two times a day (BID) | CUTANEOUS | 2 refills | Status: DC

## 2018-11-23 NOTE — Telephone Encounter (Signed)
Patient called with concerns regarding skin breakdown and drainage on her breast since her radiation treatments ended.  She states that she has a burning sensation on her breast and underneath her arm.  She was advised to use some aquaphor and non stick dressings, keep her bra off and wear a loose fitting shirt to allow her skin to breathe.  She was informed that a prescription for silvadene will be sent in to her pharmacy.  Patient verbalized understanding and no further questions at this time.  Will continue to follow as necessary.  Gloriajean Dell. Leonie Green, BSN

## 2018-11-23 NOTE — Telephone Encounter (Signed)
Neulasta Onpro Patient Outreach Note  Patient was contacted on 11/23/2018 in regards to switching G-CSF therapy to Neulasta Onpro (pegfilgrastim). Patient was educated on the purpose of this proposed change in therapy due to COVID-19 pandemic. Patient was educated about Neulasta Onpro on-body injector and patient will be provided with an educational video while in infusion on their next scheduled date if changed to Neulasta Onpro.   [x]  Patient agrees to change in therapy. Begin process to change to Neulasta Onpro therapy.  []  Patient does not agree to change in therapy. No change to Neulasta Onpro at this time.    Thank You,  Egbert Garibaldi  11/23/2018 3:01 PM

## 2018-11-26 DIAGNOSIS — J9601 Acute respiratory failure with hypoxia: Secondary | ICD-10-CM | POA: Diagnosis not present

## 2018-11-28 ENCOUNTER — Telehealth: Payer: Self-pay | Admitting: *Deleted

## 2018-11-28 NOTE — Telephone Encounter (Signed)
Patient has a 'more thick drainage' return on her pleurex cath. She continues to drain every 2-3 days, but is only getting 20-50cc. She states on Sunday when she drained, there was 10-20cc of clear drainage followed by a small amount of thicker yellow drainage. She drained again today, with 5-10cc of clear drainage, followed by a small return of thick fluid. She denies any other signs of infection.   Reviewed with Dr Maylon Peppers. Right now he doesn't want to make any changes to the treatment plan, but when patient comes in this Thursday for regularly scheduled treatment appointment, we will have her drain her Pleurex with the MD present to visualize what is going on. She agrees with plan and knows to call the office if she develops fever or other signs of infection.

## 2018-11-30 ENCOUNTER — Encounter: Payer: Self-pay | Admitting: Hematology

## 2018-11-30 ENCOUNTER — Telehealth: Payer: Self-pay | Admitting: Hematology

## 2018-11-30 ENCOUNTER — Encounter: Payer: Self-pay | Admitting: *Deleted

## 2018-11-30 ENCOUNTER — Ambulatory Visit (HOSPITAL_BASED_OUTPATIENT_CLINIC_OR_DEPARTMENT_OTHER)
Admission: RE | Admit: 2018-11-30 | Discharge: 2018-11-30 | Disposition: A | Discharge status: Home / Self Care | Payer: BLUE CROSS/BLUE SHIELD | Source: Ambulatory Visit | Attending: Hematology | Admitting: Hematology

## 2018-11-30 ENCOUNTER — Other Ambulatory Visit: Payer: Self-pay | Admitting: *Deleted

## 2018-11-30 ENCOUNTER — Inpatient Hospital Stay (HOSPITAL_BASED_OUTPATIENT_CLINIC_OR_DEPARTMENT_OTHER): Payer: BLUE CROSS/BLUE SHIELD | Admitting: Hematology

## 2018-11-30 ENCOUNTER — Ambulatory Visit: Payer: BLUE CROSS/BLUE SHIELD

## 2018-11-30 ENCOUNTER — Inpatient Hospital Stay: Payer: BLUE CROSS/BLUE SHIELD

## 2018-11-30 ENCOUNTER — Encounter: Payer: Self-pay | Admitting: Pharmacist

## 2018-11-30 ENCOUNTER — Other Ambulatory Visit: Payer: Self-pay | Admitting: Hematology

## 2018-11-30 ENCOUNTER — Other Ambulatory Visit: Payer: Self-pay

## 2018-11-30 VITALS — BP 112/61 | HR 72 | Temp 98.2°F | Resp 18 | Wt 234.0 lb

## 2018-11-30 DIAGNOSIS — C7800 Secondary malignant neoplasm of unspecified lung: Secondary | ICD-10-CM | POA: Diagnosis not present

## 2018-11-30 DIAGNOSIS — C7931 Secondary malignant neoplasm of brain: Secondary | ICD-10-CM

## 2018-11-30 DIAGNOSIS — D72829 Elevated white blood cell count, unspecified: Secondary | ICD-10-CM

## 2018-11-30 DIAGNOSIS — Z171 Estrogen receptor negative status [ER-]: Secondary | ICD-10-CM

## 2018-11-30 DIAGNOSIS — R7989 Other specified abnormal findings of blood chemistry: Secondary | ICD-10-CM | POA: Diagnosis not present

## 2018-11-30 DIAGNOSIS — Z79899 Other long term (current) drug therapy: Secondary | ICD-10-CM

## 2018-11-30 DIAGNOSIS — G893 Neoplasm related pain (acute) (chronic): Secondary | ICD-10-CM | POA: Diagnosis not present

## 2018-11-30 DIAGNOSIS — C50812 Malignant neoplasm of overlapping sites of left female breast: Secondary | ICD-10-CM | POA: Diagnosis not present

## 2018-11-30 DIAGNOSIS — D473 Essential (hemorrhagic) thrombocythemia: Secondary | ICD-10-CM

## 2018-11-30 DIAGNOSIS — D75839 Thrombocytosis, unspecified: Secondary | ICD-10-CM

## 2018-11-30 DIAGNOSIS — J189 Pneumonia, unspecified organism: Secondary | ICD-10-CM

## 2018-11-30 DIAGNOSIS — C50919 Malignant neoplasm of unspecified site of unspecified female breast: Secondary | ICD-10-CM

## 2018-11-30 DIAGNOSIS — D638 Anemia in other chronic diseases classified elsewhere: Secondary | ICD-10-CM | POA: Diagnosis not present

## 2018-11-30 DIAGNOSIS — N61 Mastitis without abscess: Secondary | ICD-10-CM

## 2018-11-30 DIAGNOSIS — J910 Malignant pleural effusion: Secondary | ICD-10-CM

## 2018-11-30 DIAGNOSIS — Z5111 Encounter for antineoplastic chemotherapy: Secondary | ICD-10-CM | POA: Diagnosis not present

## 2018-11-30 DIAGNOSIS — R0602 Shortness of breath: Secondary | ICD-10-CM | POA: Diagnosis not present

## 2018-11-30 DIAGNOSIS — D649 Anemia, unspecified: Secondary | ICD-10-CM

## 2018-11-30 LAB — CMP (CANCER CENTER ONLY)
ALT: 47 U/L — ABNORMAL HIGH (ref 0–44)
AST: 55 U/L — ABNORMAL HIGH (ref 15–41)
Albumin: 3 g/dL — ABNORMAL LOW (ref 3.5–5.0)
Alkaline Phosphatase: 119 U/L (ref 38–126)
Anion gap: 11 (ref 5–15)
BUN: 11 mg/dL (ref 6–20)
CO2: 26 mmol/L (ref 22–32)
Calcium: 9.1 mg/dL (ref 8.9–10.3)
Chloride: 103 mmol/L (ref 98–111)
Creatinine: 0.64 mg/dL (ref 0.44–1.00)
GFR, Est AFR Am: >60 mL/min (ref >60)
GFR, Estimated: >60 mL/min (ref >60)
Glucose, Bld: 125 mg/dL — ABNORMAL HIGH (ref 70–99)
Potassium: 3.9 mmol/L (ref 3.5–5.1)
Sodium: 140 mmol/L (ref 135–145)
Total Bilirubin: 0.3 mg/dL (ref 0.3–1.2)
Total Protein: 6.5 g/dL (ref 6.5–8.1)

## 2018-11-30 LAB — CBC WITH DIFFERENTIAL (CANCER CENTER ONLY)
Abs Immature Granulocytes: 0.75 10*3/uL — ABNORMAL HIGH (ref 0.00–0.07)
Basophils Absolute: 0 10*3/uL (ref 0.0–0.1)
Basophils Relative: 0 %
Eosinophils Absolute: 0 10*3/uL (ref 0.0–0.5)
Eosinophils Relative: 0 %
HCT: 24.3 % — ABNORMAL LOW (ref 36.0–46.0)
Hemoglobin: 7.3 g/dL — ABNORMAL LOW (ref 12.0–15.0)
Immature Granulocytes: 4 %
Lymphocytes Relative: 3 %
Lymphs Abs: 0.4 10*3/uL — ABNORMAL LOW (ref 0.7–4.0)
MCH: 26.7 pg (ref 26.0–34.0)
MCHC: 30 g/dL (ref 30.0–36.0)
MCV: 89 fL (ref 80.0–100.0)
Monocytes Absolute: 1.5 10*3/uL — ABNORMAL HIGH (ref 0.1–1.0)
Monocytes Relative: 9 %
Neutro Abs: 14.3 10*3/uL — ABNORMAL HIGH (ref 1.7–7.7)
Neutrophils Relative %: 84 %
Platelet Count: 741 10*3/uL — ABNORMAL HIGH (ref 150–400)
RBC: 2.73 MIL/uL — ABNORMAL LOW (ref 3.87–5.11)
RDW: 15.3 % (ref 11.5–15.5)
WBC Count: 17 10*3/uL — ABNORMAL HIGH (ref 4.0–10.5)
nRBC: 0.2 % (ref 0.0–0.2)

## 2018-11-30 LAB — SAMPLE TO BLOOD BANK

## 2018-11-30 LAB — PREPARE RBC (CROSSMATCH)

## 2018-11-30 LAB — MAGNESIUM: Magnesium: 1.9 mg/dL (ref 1.7–2.4)

## 2018-11-30 MED ORDER — CLINDAMYCIN HCL 300 MG PO CAPS: ORAL_CAPSULE | ORAL | 1 refills | Status: DC

## 2018-11-30 MED ORDER — AMOXICILLIN-POT CLAVULANATE 875-125 MG PO TABS: 1.0000 | ORAL_TABLET | Freq: Two times a day (BID) | ORAL | 0 refills | Status: AC

## 2018-11-30 MED ORDER — CLINDAMYCIN HCL MONOHYDRATE POWD: Freq: Two times a day (BID) | 4 refills | Status: DC

## 2018-11-30 NOTE — Progress Notes (Signed)
[facility] OFFICE PROGRESS NOTE  Patient Care Team: Rakes, Connye Burkitt, FNP as PCP - General (Family Medicine) Tish Men, MD as Medical Oncologist (Hematology) Cordelia Poche, RN as Oncology Nurse Navigator  HEME/ONC OVERVIEW: 1. 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 Port in 10/2018   3. 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  -S/p 1 cycles of THP -Patient tolerated the treatment relatively well  -Labs notable for new onset leukocytosis and worsening thrombocytosis, concerning for infection, as well as progressive anemia -Therefore, we will hold treatment today and re-assess the patient in 1 week before resuming  chemotherapy  -[id number] monitoring while on Herceptin  -PRN anti-emetics: Zofran, Compazine, Ativan and dexamethasone -PRN anti-diarrheals: Imodium  Acute on chronic anemia -Secondary to anemia of chronic disease and chemotherapy side effect -Hgb 7.3 today, down significantly from 10.5 three weeks ago  -Patient denies any symptoms of bleeding  -I have ordered iron profile and type/screen -Given her symptomatic anemia with increasing dyspnea, I have ordered 1 unit RBC transfusion for 12/01/2018   Leukocytosis -Possibly due to G-CSF as well as wound infection of the breast -I have ordered CXR to rule out any pneumonia, given increasing dyspnea  -Given the foul-smelling drainage from the breast wound as well as the Pleur-X site, I have prescribed Augmentin BID x 14 days, as well as topical clindamycin powder to be applied to the wounds twice a day  Thrombocytosis -Plts 741k, up from 441k three weeks ago -See the management of leukocytosis as above -I have ordered iron profile, including soluble transferrin, to rule out any iron deficiency that can exacerbate thrombocytosis  Breast wound drainage with suspected cellulitis  -Likely due to dermal involvement by the breast cancer  -Due to the purulent drainage, I have prescribed Augmentin and topical clindamycin as above -In addition, I have referred the patient to wound care center for further management of the breast wounds   Malignant left pleural effusion  -S/p Pleur-X placement in late 10/2018  -Since starting treatment, the quantity of pleural effusion has been decreasing, and she is now draining very small amount once a week -I have ordered CXR today -If minimal effusion, we will ask IR to remove the Pleur-X catheter, especially given some drainage from the catheter entry site and stitches coming loose from the  skin   Metastasis to the brain  -S/p SBRT to the brain met on 11/08/2018 -Patient is currently asymptomatic  -I discussed  the case with radiation oncology, who plans to order MRI brain in ~3 months after SRS; to be coordinated by rad onc and brain tumor board  -I counseled the patient on any concerning symptoms, including new onset or persistent headache, vision change, diplopia, or unilateral extremity weakness/numbness/tingling, for which she should contact the clinic promptly  Cancer-related pain -Secondary to the breast malignancy invading the left pectoralis muscle -Currently on MS-Contin 50m BID with PRN oxycodone 180mq6hrs PRN, gabapentin 6007mID  -Pain relatively well controlled -Continue the regimen above   Orders Placed This Encounter  Procedures  . DG Chest 2 View    Standing Status:   Future    Standing Expiration Date:   11/30/2019    Order Specific Question:   Reason for Exam (SYMPTOM  OR DIAGNOSIS REQUIRED)    Answer:   shortness of breath    Order Specific Question:   Preferred imaging location?    Answer:   [facility] Question:   Radiology Contrast Protocol - do NOT remove file path    Answer:   [email or web address]    Order Specific Question:   Is patient pregnant?    Answer:   No  . Iron and TIBC    Standing Status:   Future    Number of Occurrences:   1    Standing Expiration Date:   01/04/2020  . Soluble transferrin receptor    Standing Status:   Future    Number of Occurrences:   1    Standing Expiration Date:   11/30/2019  . Ambulatory referral to Wound care center    Referral Priority:   Urgent    Referral Type:   Consultation    Number of Visits Requested:   1  . Practitioner attestation of consent    I, the ordering practitioner, attest that I have discussed with the patient the benefits, risks, side effects, alternatives, likelihood of achieving goals and potential problems during recovery for the procedure listed.    Standing Status:   Future    Standing Expiration Date:   11/30/2019    Order Specific  Question:   Procedure    Answer:   Blood Product(s)  . Complete patient signature process for consent form    Standing Status:   Future    Standing Expiration Date:   11/30/2019  . Care order/instruction    Transfuse Parameters    Standing Status:   Future    Standing Expiration Date:   11/30/2019  . Type and screen         Standing Status:   Future    Number of Occurrences:   1    Standing Expiration Date:   11/30/2019   All questions were answered. The patient knows to call the clinic with any problems, questions or concerns. No barriers to learning was detected.  A total of more than 40 minutes were spent face-to-face with the patient during this encounter and over half of that time was spent on counseling and coordination of care as outlined above.   Return in 1 week for labs, port flush and clinic appt.   YanTish MenD 11/30/2018 11:11 AM  CHIEF COMPLAINT: "I am more short of breath"  INTERVAL HISTORY: Ms. DalBaehrturns to clinic for follow-up of metastatic HER2+ breast cancer.  Patient reports  that she was initially draining her Pleurx catheter every 2 to 3 days, but since starting treatment, the quantity of pleural effusion has been decreasing, and over the past week, she has not been only doing it once a week.  She reports some scant yellow drainage coming from the catheter entry site near the skin.  In addition, she reports that over the past week she has had increasing shortness of breath, limiting her to walking 15 to 20 feet, but she denies any fever, chest pain, worsening cough, sputum production, or hemoptysis.  Her appetite has been increasing over the past few weeks.  She had one episode of large-volume diarrhea after starting chemotherapy 3 weeks ago, but since then, her stools have been soft and she has not needed to take any Imodium.  She denies any numbness or tingling in hands or feet, or abnormal bleeding/bruising.  She has had some open wounds over the left breast, for  which she has been applying topical silver sulfadiazine.  The wound around the left nipple appears to be improving, but the wound over the left upper outer quadrant of the breast appears to have some thick yellow drainage.  Overall, she feels that her breast size has improved since starting chemotherapy.  SUMMARY OF ONCOLOGIC HISTORY:   Malignant neoplasm of overlapping sites of breast ([place])   09/30/2018 Imaging    CT chest with contrast (at [facility]): Impressions: 1.  Dermal thickening of the multiple masses within the left breast as well as a invasive 6 cm mass of the left pectoralis major, compatible with primary breast cancer. 2.  Numerous pulmonary metastases.  Left axillary, retropectoral, supraclavicular, and upper mediastinal metastatic lymphadenopathy.  Mildly enlarged right axillary and supraclavicular lymph nodes may be metastatic. 3.  Indeterminant subcentimeter lucency in the dome of the right liver. 4.  Small left pleural effusion.    10/10/2018 Initial Diagnosis    Breast cancer ([place])    10/17/2018 Imaging    CTA chest: IMPRESSION: 1. No evidence of lobar or more central pulmonary embolus. Nondiagnostic segmental assessment. 2. Moderate left pleural effusion, increased in size from the prior CT with increasing left lung atelectasis. 3. Unchanged appearance of multiple left breast masses including an invasive mass involving the pectoralis major. 4. Unchanged left axillary, left subpectoral, and mediastinal lymphadenopathy. 5. Mild interval enlargement of multiple lung metastases and of right axillary lymph nodes.    10/18/2018 Imaging    MRI brain w/o and w/ contrast: Single 4 mm focus of enhancement within right frontal white matter probably representing metastatic disease. Attention at follow-up is recommended.    10/19/2018 Imaging    CT abdomen/pelvis w/ contrast: IMPRESSION: 1. No CT evidence of metastatic disease involving the abdomen or pelvis. 2. Left  pleural effusion associated atelectasis consolidation with numerous pulmonary masses and nodules on partially imaged. Skin thickening of the left breast. Findings are in keeping with advanced breast malignancy and as seen on recent CT of the chest, 10/17/2018.    10/19/2018 Procedure    US-guided bx of left supraclavicular LN     10/19/2018 Pathology Results    Accession: [id number]  Lymph node, needle/core biopsy, left supraclavicular - METASTATIC CARCINOMA    11/09/2018 -  Chemotherapy    The patient had pegfilgrastim-cbqv [facility]) injection 6 mg, 6 mg, Subcutaneous, Once, 1 of 6 cycles Administration: 6 mg (11/10/2018) trastuzumab (HERCEPTIN) 900 mg in sodium chloride 0.9 % 250 mL chemo infusion, 966 mg, Intravenous,  Once, 1 of 6 cycles Administration:  900 mg (11/09/2018) DOCEtaxel (TAXOTERE) 170 mg in sodium chloride 0.9 % 250 mL chemo infusion, 75 mg/m2 = 170 mg, Intravenous,  Once, 1 of 6 cycles Administration: 170 mg (11/09/2018) pertuzumab (PERJETA) 840 mg in sodium chloride 0.9 % 250 mL chemo infusion, 840 mg, Intravenous, Once, 1 of 6 cycles Administration: 840 mg (11/09/2018)  for chemotherapy treatment.      REVIEW OF SYSTEMS:   Constitutional: ( - ) fevers, ( - )  chills , ( - ) night sweats Eyes: ( - ) blurriness of vision, ( - ) double vision, ( - ) watery eyes Ears, nose, mouth, throat, and face: ( - ) mucositis, ( - ) sore throat Respiratory: ( + ) cough, ( + ) dyspnea, ( - ) wheezes Cardiovascular: ( - ) palpitation, ( - ) chest discomfort, ( - ) lower extremity swelling Gastrointestinal:  ( - ) nausea, ( - ) heartburn, ( - ) change in bowel habits Skin: ( + ) abnormal skin rashes Lymphatics: ( - ) new lymphadenopathy, ( - ) easy bruising Neurological: ( - ) numbness, ( - ) tingling, ( - ) new weaknesses Behavioral/Psych: ( - ) mood change, ( - ) new changes  All other systems were reviewed with the patient and are negative.  I have reviewed the past medical history,  past surgical history, social history and family history with the patient and they are unchanged from previous note.  ALLERGIES:  is allergic to aspirin.  MEDICATIONS:  Current Outpatient Medications  Medication Sig Dispense Refill  . amoxicillin-clavulanate (AUGMENTIN) 875-125 MG tablet Take 1 tablet by mouth 2 (two) times daily for 14 days. 28 tablet 0  . Clindamycin HCl (CLINDAMYCIN MONOHYDRATE) powder Apply topically 2 (two) times daily for 30 days. To the wounds on the breast 10 g 4  . dexamethasone (DECADRON) 4 MG tablet Take 2 tablets (8 mg total) by mouth 2 (two) times daily. Start the day before Taxotere. Then daily after chemo for 2 days. 30 tablet 4  . gabapentin (NEURONTIN) 300 MG capsule Take 2 capsules (600 mg total) by mouth 2 (two) times daily. 120 capsule 5  . lidocaine-prilocaine (EMLA) cream Apply to affected area once 30 g 3  . LORazepam (ATIVAN) 0.5 MG tablet Take 0.5 tablets (0.25 mg total) by mouth once as needed for up to 2 doses for anxiety (Prior to PET and MRI scan). 1 tablet 0  . LORazepam (ATIVAN) 0.5 MG tablet Take 1 tablet (0.5 mg total) by mouth every 6 (six) hours as needed (Nausea or vomiting). 30 tablet 0  . morphine (MS CONTIN) 30 MG 12 hr tablet Take 1 tablet (30 mg total) by mouth every 12 (twelve) hours for 30 days. 60 tablet 0  . ondansetron (ZOFRAN) 8 MG tablet Take 1 tablet (8 mg total) by mouth 2 (two) times daily as needed for refractory nausea / vomiting. 30 tablet 4  . oxyCODONE (OXY IR/ROXICODONE) 5 MG immediate release tablet Take 2 tablets (10 mg total) by mouth every 6 (six) hours as needed for up to 30 days for severe pain. 90 tablet 0  . prochlorperazine (COMPAZINE) 10 MG tablet Take 1 tablet (10 mg total) by mouth every 6 (six) hours as needed (Nausea or vomiting). 30 tablet 4  . silver sulfADIAZINE (SILVADENE) 1 % cream Apply 1 application topically 2 (two) times daily. 50 g 2   No current facility-administered medications for this visit.      PHYSICAL EXAMINATION: ECOG PERFORMANCE STATUS: 2 -  Symptomatic, <50% confined to bed  Today's Vitals   11/30/18 1000  BP: 112/61  Pulse: 72  Resp: 18  Temp: 98.2 F (36.8 C)  TempSrc: Oral  Weight: 234 lb (106.1 kg)  PainSc: 0-No pain   Body mass index is 40.17 kg/m.  Filed Weights   11/30/18 1000  Weight: 234 lb (106.1 kg)    GENERAL: alert, no distress, dyspnea with exertion  SKIN: healing skin wound around the left nipple, a strip of wound (~5x2cm) over the left upper quadrant of the breast with some yellow, moderately thick drainage  EYES: conjunctiva are pink and non-injected, sclera clear OROPHARYNX: no exudate, no erythema; lips, buccal mucosa, and tongue normal  NECK: supple, non-tender LUNGS: slightly decreased air movement in the left lung base, otherwise clear to auscultation HEART: regular rate & rhythm and no murmurs and no lower extremity edema ABDOMEN: soft, non-tender, non-distended, normal bowel sounds Musculoskeletal: no cyanosis of digits and no clubbing  PSYCH: alert & oriented x 3, fluent speech NEURO: no focal motor/sensory deficits  LABORATORY DATA:  I have reviewed the data as listed    Component Value Date/Time   NA 140 11/30/2018 0946   K 3.9 11/30/2018 0946   CL 103 11/30/2018 0946   CO2 26 11/30/2018 0946   GLUCOSE 125 (H) 11/30/2018 0946   BUN 11 11/30/2018 0946   CREATININE 0.64 11/30/2018 0946   CALCIUM 9.1 11/30/2018 0946   PROT 6.5 11/30/2018 0946   ALBUMIN 3.0 (L) 11/30/2018 0946   AST 55 (H) 11/30/2018 0946   ALT 47 (H) 11/30/2018 0946   ALKPHOS 119 11/30/2018 0946   BILITOT 0.3 11/30/2018 0946   GFRNONAA >60 11/30/2018 0946   GFRAA >60 11/30/2018 0946    No results found for: SPEP, UPEP  Lab Results  Component Value Date   WBC 17.0 (H) 11/30/2018   NEUTROABS 14.3 (H) 11/30/2018   HGB 7.3 (L) 11/30/2018   HCT 24.3 (L) 11/30/2018   MCV 89.0 11/30/2018   PLT 741 (H) 11/30/2018      Chemistry      Component  Value Date/Time   NA 140 11/30/2018 0946   K 3.9 11/30/2018 0946   CL 103 11/30/2018 0946   CO2 26 11/30/2018 0946   BUN 11 11/30/2018 0946   CREATININE 0.64 11/30/2018 0946      Component Value Date/Time   CALCIUM 9.1 11/30/2018 0946   ALKPHOS 119 11/30/2018 0946   AST 55 (H) 11/30/2018 0946   ALT 47 (H) 11/30/2018 0946   BILITOT 0.3 11/30/2018 0946       RADIOGRAPHIC STUDIES: I have personally reviewed the radiological images as listed below and agreed with the findings in the report. Dg Chest Port 1 View  Result Date: 11/01/2018 CLINICAL DATA:  Left PleurX catheter placement. Metastatic breast cancer EXAM: PORTABLE CHEST 1 VIEW COMPARISON:  10/27/2018 FINDINGS: Multiple pulmonary nodules compatible with widespread metastatic disease. PleurX catheter in the left lung base not fully evaluated due to large patient size and enlargement of the left breast due to tumor. No pneumothorax. Port-A-Cath tip SVC/RA junction. Marked enlargement of left axilla and left breast soft tissues due to tumor IMPRESSION: PleurX catheter left lung base. Tip of the catheter difficult to see due to overlying metastatic disease. No pneumothorax. Electronically Signed   By: Franchot Gallo M.D.   On: 11/01/2018 16:03   Ir Perc Pleural Drain W/[place]well Cath W/img Guide  Result Date: 11/01/2018 INDICATION: 43 year old with metastatic breast cancer and recurrent left  pleural effusion. EXAM: PLACEMENT A TUNNELED LEFT PLEURAL CATHETER WITH ULTRASOUND AND FLUOROSCOPIC GUIDANCE MEDICATIONS: Ancef 2 g ANESTHESIA/SEDATION: Fentanyl 100 mcg IV; Versed 4.0 mg IV Moderate Sedation Time:  24 minutes The patient was continuously monitored during the procedure by the interventional radiology nurse under my direct supervision. FLUOROSCOPY TIME:  24 seconds, 27 mGy COMPLICATIONS: None immediate. PROCEDURE: Informed written consent was obtained from the patient after a thorough discussion of the procedural risks, benefits and  alternatives. All questions were addressed. Maximal Sterile Barrier Technique was utilized including caps, mask, sterile gowns, sterile gloves, sterile drape, hand hygiene and skin antiseptic. A timeout was performed prior to the initiation of the procedure. Ultrasound demonstrated left pleural fluid. The left lower chest was prepped and draped in sterile fashion. Skin was anesthetized at 2 areas and 2 small incisions were made. PleurX catheter was tunneled between these 2 incisions. Cuff was placed underneath the skin. At the more lateral incision, a Yueh catheter was directed into the left pleural fluid using ultrasound guidance and clear yellow fluid was aspirated. A stiff Amplatz wire was advanced into the pleural space. Wire position was confirmed with fluoroscopy. Tract was dilated to accommodate the peel-away sheath. Catheter was cut and slightly shortened. Catheter was advanced through the peel-away sheath and easily directed into the pleural space. Catheter was attached to vacuum canister and approximately 1 L of yellow fluid was removed. The patient had significant coughing during removal of the pleural fluid. The pleural skin entrance site was closed using absorbable suture and Dermabond. The catheter was sutured to skin with Prolene suture. Dressings placed over the catheter and incision. Fluoroscopic and ultrasound images were taken and saved for documentation. FINDINGS: Pleural catheter successfully placed in the left pleural space. 1 L of fluid was removed. IMPRESSION: Successful placement of a tunneled left pleural catheter with ultrasound and fluoroscopic guidance. Electronically Signed   By: Markus Daft M.D.   On: 11/01/2018 16:27

## 2018-11-30 NOTE — Telephone Encounter (Signed)
Appointments scheduled avs/calendar printed per 4/23 los °

## 2018-11-30 NOTE — Progress Notes (Signed)
  Radiation Oncology         [phone] ________________________________  Name: Sharon Floyd MRN: [id number]  Date: 11/08/2018  DOB: 12-22-1975  SRS End of Treatment Note  Diagnosis:   43 y.o. female with Stage IV carcinoma of the left breast with metastasis to brain  Indication for treatment:  palliative       Radiation treatment dates:   11/08/2018  Site/dose:   Brain PTV1: Right Frontal 32mm // 20 Gy in 1 fraction, Max dose=127.5%  Beams/energy:   Exac Trac SBRT/SRT-3D, 3 dca fields // 6FFF Photon  Narrative: The patient tolerated radiation treatment well.   There were no signs of acute toxicity after treatment.  Plan: The patient has completed radiation treatment. The patient will return to radiation oncology clinic for routine followup in one month. I advised the patient to call or return sooner if they have any questions or concerns related to their recovery or treatment. ________________________________  Jodelle Gross, MD, PhD  This document serves as a record of services personally performed by Kyung Rudd, MD. It was created on his behalf by Rae Lips, a trained medical scribe. The creation of this record is based on the scribe's personal observations and the provider's statements to them. This document has been checked and approved by the attending provider.

## 2018-11-30 NOTE — Telephone Encounter (Signed)
Appointment for [place] has been scheduled and patient notified/ Referral placed through [facility]

## 2018-11-30 NOTE — Patient Instructions (Signed)

## 2018-11-30 NOTE — Progress Notes (Signed)
Patient in office for follow up. Had been having some complaints of yellow drainage from her Pleurex site. While in the office with Dr Maylon Peppers, I assisted with draining the pleurex catheter. Only 5-10cc of yellow, slightly blood tinged fluid returned. No other drainage of any concern. The insertion site to her L side however, does show some erosion with yellow drainage. It also appears as though the skin suture has come loose from the skin. Dr Maylon Peppers present to visually assess these.

## 2018-11-30 NOTE — Progress Notes (Signed)
  Radiation Oncology         [phone] ________________________________  Name: Sharon Floyd MRN: [id number]  Date: 11/07/2018  DOB: 1976-05-29  End of Treatment Note  Diagnosis:   43 y.o. female with Stage IV carcinoma of the left breast   Indication for treatment:  Palliative       Radiation treatment dates:   10/23/2018 - 11/07/2018  Site/dose:   Left Breast / 36 Gy in 12 fractions  Beams/energy:   3D / 15X Photon  Narrative: The patient tolerated radiation treatment relatively well.  She did experience some skin irritation as she progressed through treatment. There is hyperpigmentation and a small area of ulceration in the upper outer quadrant where there is skin involvement; overall improved size. She was given Radiaplex and antibiotic ointment. She continues to have breast swelling and pain for which she has medication. She also noted severe fatigue.  Plan: The patient has completed radiation treatment. The patient will return to radiation oncology clinic for routine followup in one month. I advised them to call or return sooner if they have any questions or concerns related to their recovery or treatment.  ------------------------------------------------  Jodelle Gross, MD, PhD  This document serves as a record of services personally performed by Kyung Rudd, MD. It was created on his behalf by Rae Lips, a trained medical scribe. The creation of this record is based on the scribe's personal observations and the provider's statements to them. This document has been checked and approved by the attending provider.

## 2018-12-01 ENCOUNTER — Inpatient Hospital Stay: Payer: BLUE CROSS/BLUE SHIELD

## 2018-12-01 ENCOUNTER — Ambulatory Visit: Payer: BLUE CROSS/BLUE SHIELD

## 2018-12-01 ENCOUNTER — Other Ambulatory Visit: Payer: Self-pay | Admitting: Hematology

## 2018-12-01 DIAGNOSIS — Z5111 Encounter for antineoplastic chemotherapy: Secondary | ICD-10-CM | POA: Diagnosis not present

## 2018-12-01 DIAGNOSIS — C7800 Secondary malignant neoplasm of unspecified lung: Secondary | ICD-10-CM | POA: Diagnosis not present

## 2018-12-01 DIAGNOSIS — D638 Anemia in other chronic diseases classified elsewhere: Secondary | ICD-10-CM | POA: Diagnosis not present

## 2018-12-01 DIAGNOSIS — G893 Neoplasm related pain (acute) (chronic): Secondary | ICD-10-CM | POA: Diagnosis not present

## 2018-12-01 DIAGNOSIS — C50812 Malignant neoplasm of overlapping sites of left female breast: Secondary | ICD-10-CM | POA: Diagnosis not present

## 2018-12-01 DIAGNOSIS — D72829 Elevated white blood cell count, unspecified: Secondary | ICD-10-CM | POA: Diagnosis not present

## 2018-12-01 DIAGNOSIS — J910 Malignant pleural effusion: Secondary | ICD-10-CM | POA: Diagnosis not present

## 2018-12-01 DIAGNOSIS — R7989 Other specified abnormal findings of blood chemistry: Secondary | ICD-10-CM | POA: Diagnosis not present

## 2018-12-01 DIAGNOSIS — D649 Anemia, unspecified: Secondary | ICD-10-CM

## 2018-12-01 DIAGNOSIS — Z79899 Other long term (current) drug therapy: Secondary | ICD-10-CM | POA: Diagnosis not present

## 2018-12-01 DIAGNOSIS — C7931 Secondary malignant neoplasm of brain: Secondary | ICD-10-CM | POA: Diagnosis not present

## 2018-12-01 LAB — IRON AND TIBC
Iron: 36 ug/dL — ABNORMAL LOW (ref 41–142)
Saturation Ratios: 20 % — ABNORMAL LOW (ref 21–57)
TIBC: 183 ug/dL — ABNORMAL LOW (ref 236–444)
UIBC: 147 ug/dL (ref 120–384)

## 2018-12-01 LAB — ABO/RH: ABO/RH(D): A POS

## 2018-12-01 LAB — SOLUBLE TRANSFERRIN RECEPTOR: Transferrin Receptor: 28.1 nmol/L — ABNORMAL HIGH (ref 12.2–27.3)

## 2018-12-01 MED ORDER — HEPARIN SOD (PORK) LOCK FLUSH 100 UNIT/ML IV SOLN
250.0000 [IU] | INTRAVENOUS | Status: DC | PRN
  Filled 2018-12-01: qty 5

## 2018-12-01 MED ORDER — HEPARIN SOD (PORK) LOCK FLUSH 100 UNIT/ML IV SOLN
500.0000 [IU] | Freq: Once | INTRAVENOUS | Status: DC
  Filled 2018-12-01: qty 5

## 2018-12-01 MED ORDER — SODIUM CHLORIDE 0.9% FLUSH
3.0000 mL | INTRAVENOUS | Status: DC | PRN
  Filled 2018-12-01: qty 10

## 2018-12-01 MED ORDER — SODIUM CHLORIDE 0.9% FLUSH
10.0000 mL | Freq: Once | INTRAVENOUS | Status: AC
  Administered 2018-12-01: 10 mL via INTRAVENOUS
  Filled 2018-12-01: qty 10

## 2018-12-01 MED ORDER — SODIUM CHLORIDE 0.9% IV SOLUTION
250.0000 mL | Freq: Once | INTRAVENOUS | Status: AC
  Administered 2018-12-01: 250 mL via INTRAVENOUS
  Filled 2018-12-01: qty 250

## 2018-12-01 NOTE — Patient Instructions (Signed)
Blood Transfusion, Adult A blood transfusion is a procedure in which you are given blood through an IV tube. You may need this procedure because of:  Illness.  Surgery.  Injury. The blood may come from someone else (a donor). You may also be able to donate blood for yourself (autologous blood donation). The blood given in a transfusion is made up of different types of cells. You may get:  Red blood cells. These carry oxygen to the cells in the body.  White blood cells. These help you fight infections.  Platelets. These help your blood to clot.  Plasma. This is the liquid part of your blood. It helps with fluid imbalances. If you have a clotting disorder, you may also get other types of blood products. What happens before the procedure?  You will have a blood test to find out your blood type. The test also finds out what type of blood your body will accept and matches it to the donor type.  If you are going to have a planned surgery, you may be able to donate your own blood. This may be done in case you need a transfusion.  If you have had an allergic reaction to a transfusion in the past, you may be given medicine to help prevent a reaction. This medicine may be given to you by mouth or through an IV.  You will have your temperature, blood pressure, and pulse checked.  Follow instructions from your doctor about what you cannot eat or drink.  Ask your doctor about: ? Changing or stopping your regular medicines. This is important if you take diabetes medicines or blood thinners. ? Taking medicines such as aspirin and ibuprofen. These medicines can thin your blood. Do not take these medicines before your procedure if your doctor tells you not to. What happens during the procedure?  An IV tube will be put into one of your veins.  The bag of donated blood will be attached to your IV tube. Then, the blood will enter through your vein.  Your temperature, blood pressure, and pulse will  be checked regularly during the procedure. This is done to find early signs of a transfusion reaction.  If you have any signs or symptoms of a reaction, your transfusion will be stopped. You may also be given medicine.  When the transfusion is done, your IV tube will be taken out.  Pressure may be applied to the IV site for a few minutes.  A bandage (dressing) will be put on the IV site. The procedure may vary among doctors and hospitals. What happens after the procedure?  Your temperature, blood pressure, heart rate, breathing rate, and blood oxygen level will be checked often.  Your blood may be tested to see how you are responding to the transfusion.  You may be warmed with fluids or blankets. This is done to keep the temperature of your body normal. Summary  A blood transfusion is a procedure in which you are given blood through an IV tube.  The blood may come from someone else (a donor). You may also be able to donate blood for yourself.  If you have had an allergic reaction to a transfusion in the past, you may be given medicine to help prevent a reaction. This medicine may be given to you by mouth or through an IV tube.  Your temperature, blood pressure, heart rate, breathing rate, and blood oxygen level will be checked often.  Your blood may be tested to see   how you are responding to the transfusion. This information is not intended to replace advice given to you by your health care provider. Make sure you discuss any questions you have with your health care provider. Document Released: 10/22/2008 Document Revised: 03/19/2016 Document Reviewed: 03/19/2016 Elsevier Interactive Patient Education  2019 [employer]  

## 2018-12-02 LAB — TYPE AND SCREEN
ABO/RH(D): A POS
Antibody Screen: NEGATIVE
Unit division: 0

## 2018-12-02 LAB — BPAM RBC
Blood Product Expiration Date: 202004292359
ISSUE DATE / TIME: 202004240724
Unit Type and Rh: 6200

## 2018-12-04 ENCOUNTER — Encounter (HOSPITAL_BASED_OUTPATIENT_CLINIC_OR_DEPARTMENT_OTHER): Payer: BLUE CROSS/BLUE SHIELD | Attending: Internal Medicine

## 2018-12-04 ENCOUNTER — Other Ambulatory Visit: Payer: Self-pay

## 2018-12-04 DIAGNOSIS — C770 Secondary and unspecified malignant neoplasm of lymph nodes of head, face and neck: Secondary | ICD-10-CM | POA: Diagnosis not present

## 2018-12-04 DIAGNOSIS — S21002A Unspecified open wound of left breast, initial encounter: Secondary | ICD-10-CM | POA: Diagnosis not present

## 2018-12-04 DIAGNOSIS — Z79899 Other long term (current) drug therapy: Secondary | ICD-10-CM | POA: Insufficient documentation

## 2018-12-04 DIAGNOSIS — T6591XA Toxic effect of unspecified substance, accidental (unintentional), initial encounter: Secondary | ICD-10-CM | POA: Diagnosis not present

## 2018-12-04 DIAGNOSIS — C7800 Secondary malignant neoplasm of unspecified lung: Secondary | ICD-10-CM | POA: Diagnosis not present

## 2018-12-04 DIAGNOSIS — C7931 Secondary malignant neoplasm of brain: Secondary | ICD-10-CM | POA: Insufficient documentation

## 2018-12-04 DIAGNOSIS — C50912 Malignant neoplasm of unspecified site of left female breast: Secondary | ICD-10-CM | POA: Diagnosis not present

## 2018-12-04 DIAGNOSIS — Z171 Estrogen receptor negative status [ER-]: Secondary | ICD-10-CM | POA: Diagnosis not present

## 2018-12-04 DIAGNOSIS — Z923 Personal history of irradiation: Secondary | ICD-10-CM | POA: Insufficient documentation

## 2018-12-04 DIAGNOSIS — Z87891 Personal history of nicotine dependence: Secondary | ICD-10-CM | POA: Diagnosis not present

## 2018-12-05 ENCOUNTER — Telehealth: Payer: Self-pay | Admitting: Radiation Oncology

## 2018-12-05 ENCOUNTER — Telehealth: Payer: Self-pay | Admitting: Hematology

## 2018-12-05 ENCOUNTER — Other Ambulatory Visit: Payer: Self-pay | Admitting: Radiology

## 2018-12-05 NOTE — Telephone Encounter (Signed)
Appointments scheduled LMVM for patient with new date/time per 4/28 staff message

## 2018-12-05 NOTE — Telephone Encounter (Signed)
  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  Interval Since Last Radiation:  4 weeks   11/08/2018 SRS Treatment: PTV1: Right Frontal 50m // 20 Gy in 1 fraction   10/23/2018 - 11/07/2018:  Left Breast / 36 Gy in 12 fractions  Narrative:  The patient was contacted today for routine follow-up. During treatment she did very well with radiotherapy and did have mild ulcerative changes of the breast prior to treatment, but did develop modest desquamation. She reports she is doing well overall. She has been seen by wound care for her left breast and has been working with IR to consider having her pleurex revised. Her breathing is better according to the patient. She continues on systemic therapy and no other complaints are verbalized.  Impression/Plan: 1. 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 The patient has been doing well since completion of radiotherapy. We discussed that we would be happy to continue to follow her in our brain oncology program and she will be due for her first MRI in early July 2020, but she will also continue to follow up with Dr. ZMaylon Peppersin medical oncology. She was counseled on skin care as well as measures to avoid sun exposure to this area.      ACarola Rhine PAC

## 2018-12-06 ENCOUNTER — Other Ambulatory Visit: Payer: Self-pay | Admitting: Hematology

## 2018-12-06 ENCOUNTER — Other Ambulatory Visit: Payer: Self-pay

## 2018-12-06 ENCOUNTER — Ambulatory Visit (HOSPITAL_COMMUNITY)
Admission: RE | Admit: 2018-12-06 | Discharge: 2018-12-06 | Disposition: A | Discharge status: Home / Self Care | Payer: BLUE CROSS/BLUE SHIELD | Source: Ambulatory Visit | Attending: Hematology | Admitting: Hematology

## 2018-12-06 ENCOUNTER — Encounter (HOSPITAL_COMMUNITY): Payer: Self-pay | Admitting: Interventional Radiology

## 2018-12-06 DIAGNOSIS — C801 Malignant (primary) neoplasm, unspecified: Secondary | ICD-10-CM | POA: Diagnosis not present

## 2018-12-06 DIAGNOSIS — S21002A Unspecified open wound of left breast, initial encounter: Secondary | ICD-10-CM | POA: Diagnosis not present

## 2018-12-06 DIAGNOSIS — Z9889 Other specified postprocedural states: Secondary | ICD-10-CM | POA: Diagnosis not present

## 2018-12-06 DIAGNOSIS — J910 Malignant pleural effusion: Secondary | ICD-10-CM | POA: Insufficient documentation

## 2018-12-06 DIAGNOSIS — T8131XD Disruption of external operation (surgical) wound, not elsewhere classified, subsequent encounter: Secondary | ICD-10-CM | POA: Diagnosis not present

## 2018-12-06 DIAGNOSIS — R0600 Dyspnea, unspecified: Secondary | ICD-10-CM | POA: Diagnosis not present

## 2018-12-06 DIAGNOSIS — C50919 Malignant neoplasm of unspecified site of unspecified female breast: Secondary | ICD-10-CM | POA: Diagnosis not present

## 2018-12-06 DIAGNOSIS — J90 Pleural effusion, not elsewhere classified: Secondary | ICD-10-CM | POA: Diagnosis not present

## 2018-12-06 DIAGNOSIS — Z4682 Encounter for fitting and adjustment of non-vascular catheter: Secondary | ICD-10-CM | POA: Insufficient documentation

## 2018-12-06 HISTORY — PX: IR SINUS/FIST TUBE CHK-NON GI: IMG673

## 2018-12-06 MED ORDER — IOHEXOL 300 MG/ML  SOLN
50.0000 mL | Freq: Once | INTRAMUSCULAR | Status: AC | PRN
  Administered 2018-12-06: 20 mL

## 2018-12-06 MED ORDER — LIDOCAINE HCL 1 % IJ SOLN
INTRAMUSCULAR | Status: AC
  Filled 2018-12-06: qty 20

## 2018-12-06 NOTE — Progress Notes (Signed)
[facility] OFFICE PROGRESS NOTE  Patient Care Team: Rakes, Linda M, FNP as PCP - General (Family Medicine) Zhao, Yan, MD as Medical Oncologist (Hematology) Tracy, Jamie E, RN as Oncology Nurse Navigator  HEME/ONC OVERVIEW: 1. 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 Port in 10/2018   3. 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  -S/p 1 cycles of palliative THP w/ G-CSF support -Patient tolerated the treatment relatively well -The second cycle was delayed due to concern for wound infection over the left breast and symptomatic dyspnea due to chemotherapy-induced anemia -See the management of dyspnea below  -Due  to persistent anemia with symptomatic dyspnea, I have reduced the dose of docetaxel to 60mg/m2 from 75mg/m2  -We will see patient back in 1 week tentatively to resume the 2nd cycle of chemotherapy  -Q3month TTE monitoring while on Herceptin, next in early 02/2019  -PRN anti-emetics: Zofran, Compazine, Ativan and dexamethasone  Chemotherapy-associated anemia -Likely multifactorial, including chemotherapy, IDA, and anemia of chronic disease -Patient was given 1 unit RBC last week; unfortunately, her Hgb remained persistent low at 7.3 today -Patient denies any symptom of bleeding -Give the symptomatic dyspnea, I have ordered 2 units RBC to be transfused on 12/08/2018 -In addition, we will administer IV iron today and next week for concurrent IDA -If anemia remains persistently low despite IV iron and RBC transfusion, this is most likely due to chemotherapy side effect -Docetaxel dose reduced as above  -If her anemia persists, we will try to get the patient through chemotherapy with PRN transfusion support and possibly Aranesp, with the hope that her anemia will begin to improve once her breast cancer has sufficient time to respond to the treatment   Leukocytosis -Likely multifactorial, including underlying malignancy and recent G-CSF -WBC 22k today, up from 17k last week  -Given the dose reduction in docetaxel, the risk of neutropenia is lower, and therefore I am holding G-CSF for now and monitor her CBC closely -If she develops any persistent leukopenia with neutropenia due to chemotherapy, we can resume Onpro as needed   Thrombocytosis -Likely multifactorial, including underlying malignancy and recent G-CSF -Onpro on hold as discussed above -We will monitor it for now   Persistent dyspnea -Likely multifactorial, including severe anemia, persistent pleural effusion and pulmonary metastases -Pleur-X catheter position verified on   12/06/2018 and appeared to be functioning properly -See RBC  transfusion support above -Given the persistent dyspnea and increasing O2 requirement, I have ordered CTA chest to rule out any PTE and to assess any persistent pleural effusion  Malignant left pleural effusion  -S/p Pleur-X placement in late 10/2018 -Pleur-X was re-evaluated on 12/06/2018, and I discussed the case with IR, who felt that the catheter was appropriately positioned, but did recommend CT chest to assess for any loculated pleural effusion -CTA chest as above  -Continue weekly drainage of the pleural effusion as needed   Breast wound with suspected cellulitis -Patient was prescribed Augmentin BID x 14 days and topical clindamycin powder to the breast wound -Per recommendation from the wound care clinic, topical clindamycin was discontinued and the patient was started on calcium alginate -Continue abx as prescribed   Metastasis to the brain  -S/p SBRT to the brain met on 11/08/2018 -Per brain tumor board, rad onc is planning to repeat MRI brain in ~3 months after treatment; to be coordinated by rac onc and brain tumor board -I counseled the patient on any concerning symptoms, including new onset or persistent headache, vision change, diplopia, or unilateral extremity weakness/numbness/tingling, for which she should contact the clinic promptly  Cancer-related pain -Secondary to the breast malignancy invading the underlying muscle -Currently on MS-Contin 51m BID with PRN oxycodone 136mq6hrs PRN for breakthrough, gabapentin 60082mID -Pain relatively well controlled -Continue the regimen above   Orders Placed This Encounter  Procedures  . CT ANGIO CHEST PE W OR WO CONTRAST    Standing Status:   Future    Number of Occurrences:   1    Standing Expiration Date:   03/07/2020    Order Specific Question:   ** REASON FOR EXAM (FREE TEXT)    Answer:   Metastatic breast cancer with malignant pleural effusion, worsening dyspnea    Order Specific Question:   If indicated for the ordered  procedure, I authorize the administration of contrast media per Radiology protocol    Answer:   Yes    Order Specific Question:   Is patient pregnant?    Answer:   No    Order Specific Question:   Preferred imaging location?    Answer:   [facility] Question:   Radiology Contrast Protocol - do NOT remove file path    Answer:   _0 [email or web address]  . Practitioner attestation of consent    I, the ordering practitioner, attest that I have discussed with the patient the benefits, risks, side effects, alternatives, likelihood of achieving goals and potential problems during recovery for the procedure listed.    Standing Status:   Future    Standing Expiration Date:   12/07/2019    Order Specific Question:   Procedure    Answer:   Blood Product(s)  . Complete patient signature process for consent form    Standing Status:   Future    Standing Expiration Date:   12/07/2019  . Care order/instruction    Transfuse Parameters    Standing Status:   Future    Standing Expiration Date:   12/07/2019  . Type and screen    Standing Status:   Standing    Number of Occurrences:   1   All questions were answered. The patient knows to call the clinic with any problems, questions or concerns. No barriers to learning was detected.  A total of more than 40 minutes were spent face-to-face  with the patient during this encounter and over half of that time was spent on counseling and coordination of care as outlined above.   Return in 1 week for port flush, labs, clinic appointment, and infusion appointment (THP + IV iron).   Tish Men, MD 12/07/2018 12:22 PM  CHIEF COMPLAINT: "I am still short of breath"  INTERVAL HISTORY: Ms. Najarro returns to clinic for follow-up of metastatic breast cancer on THP.  Patient reports that her breathing improved somewhat after the RBC transfusion last week, but overall her breathing still remains labored, and she can only walk  10 to 15 feet before getting short of breath.  She has been trying to "hold it together" for family, including her 54 year old son, and has been trying to participate in activities with him, including baking pies at home.  However, her ability to walk around the house has been significantly limited by her shortness of breath.  She also reports intermittent coughing with mostly white sputum production, but she denies any fever, chest pain, hemoptysis, or change in sputum color/quantity.  She denies any nausea, vomiting, diarrhea, or neuropathy in hands and feet.  SUMMARY OF ONCOLOGIC HISTORY:   Malignant neoplasm of overlapping sites of breast ([place])   09/30/2018 Imaging    CT chest with contrast (at [facility]): Impressions: 1.  Dermal thickening of the multiple masses within the left breast as well as a invasive 6 cm mass of the left pectoralis major, compatible with primary breast cancer. 2.  Numerous pulmonary metastases.  Left axillary, retropectoral, supraclavicular, and upper mediastinal metastatic lymphadenopathy.  Mildly enlarged right axillary and supraclavicular lymph nodes may be metastatic. 3.  Indeterminant subcentimeter lucency in the dome of the right liver. 4.  Small left pleural effusion.    10/10/2018 Initial Diagnosis    Breast cancer ([place])    10/17/2018 Imaging    CTA chest: IMPRESSION: 1. No evidence of lobar or more central pulmonary embolus. Nondiagnostic segmental assessment. 2. Moderate left pleural effusion, increased in size from the prior CT with increasing left lung atelectasis. 3. Unchanged appearance of multiple left breast masses including an invasive mass involving the pectoralis major. 4. Unchanged left axillary, left subpectoral, and mediastinal lymphadenopathy. 5. Mild interval enlargement of multiple lung metastases and of right axillary lymph nodes.    10/18/2018 Imaging    MRI brain w/o and w/ contrast: Single 4 mm focus of enhancement within right  frontal white matter probably representing metastatic disease. Attention at follow-up is recommended.    10/19/2018 Imaging    CT abdomen/pelvis w/ contrast: IMPRESSION: 1. No CT evidence of metastatic disease involving the abdomen or pelvis. 2. Left pleural effusion associated atelectasis consolidation with numerous pulmonary masses and nodules on partially imaged. Skin thickening of the left breast. Findings are in keeping with advanced breast malignancy and as seen on recent CT of the chest, 10/17/2018.    10/19/2018 Procedure    US-guided bx of left supraclavicular LN     10/19/2018 Pathology Results    Accession: [id number]  Lymph node, needle/core biopsy, left supraclavicular - METASTATIC CARCINOMA    11/09/2018 -  Chemotherapy    The patient had pegfilgrastim-cbqv [facility]) injection 6 mg, 6 mg, Subcutaneous, Once, 1 of 1 cycle Administration: 6 mg (11/10/2018) trastuzumab (HERCEPTIN) 900 mg in sodium chloride 0.9 % 250 mL chemo infusion, 966 mg, Intravenous,  Once, 1 of 6 cycles Administration: 900 mg (11/09/2018) DOCEtaxel (TAXOTERE) 170 mg in sodium chloride 0.9 % 250 mL chemo infusion, 75 mg/m2 =  170 mg, Intravenous,  Once, 1 of 6 cycles Dose modification: 60 mg/m2 (original dose 75 mg/m2, Cycle 2, Reason: Dose not tolerated) Administration: 170 mg (11/09/2018) pertuzumab (PERJETA) 840 mg in sodium chloride 0.9 % 250 mL chemo infusion, 840 mg, Intravenous, Once, 1 of 6 cycles Administration: 840 mg (11/09/2018)  for chemotherapy treatment.      REVIEW OF SYSTEMS:   Constitutional: ( - ) fevers, ( - )  chills , ( - ) night sweats Eyes: ( - ) blurriness of vision, ( - ) double vision, ( - ) watery eyes Ears, nose, mouth, throat, and face: ( - ) mucositis, ( - ) sore throat Respiratory: ( + ) cough, ( + ) dyspnea, ( - ) wheezes Cardiovascular: ( - ) palpitation, ( - ) chest discomfort, ( - ) lower extremity swelling Gastrointestinal:  ( - ) nausea, ( - ) heartburn, ( - ) change  in bowel habits Skin: ( - ) abnormal skin rashes Lymphatics: ( - ) new lymphadenopathy, ( - ) easy bruising Neurological: ( - ) numbness, ( - ) tingling, ( - ) new weaknesses Behavioral/Psych: ( - ) mood change, ( - ) new changes  All other systems were reviewed with the patient and are negative.  I have reviewed the past medical history, past surgical history, social history and family history with the patient and they are unchanged from previous note.  ALLERGIES:  is allergic to aspirin.  MEDICATIONS:  Current Outpatient Medications  Medication Sig Dispense Refill  . amoxicillin-clavulanate (AUGMENTIN) 875-125 MG tablet Take 1 tablet by mouth 2 (two) times daily for 14 days. 28 tablet 0  . clindamycin (CLEOCIN) 300 MG capsule Apply the powder from 2 capsules to the skin, twice a day. DO NOT take orally. 80 capsule 1  . dexamethasone (DECADRON) 4 MG tablet Take 2 tablets (8 mg total) by mouth 2 (two) times daily. Start the day before Taxotere. Then daily after chemo for 2 days. 30 tablet 4  . gabapentin (NEURONTIN) 300 MG capsule Take 2 capsules (600 mg total) by mouth 2 (two) times daily. 120 capsule 5  . lidocaine-prilocaine (EMLA) cream Apply to affected area once 30 g 3  . LORazepam (ATIVAN) 0.5 MG tablet Take 0.5 tablets (0.25 mg total) by mouth once as needed for up to 2 doses for anxiety (Prior to PET and MRI scan). 1 tablet 0  . LORazepam (ATIVAN) 0.5 MG tablet Take 1 tablet (0.5 mg total) by mouth every 6 (six) hours as needed (Nausea or vomiting). 30 tablet 0  . morphine (MS CONTIN) 30 MG 12 hr tablet Take 1 tablet (30 mg total) by mouth every 12 (twelve) hours for 30 days. 60 tablet 0  . ondansetron (ZOFRAN) 8 MG tablet Take 1 tablet (8 mg total) by mouth 2 (two) times daily as needed for refractory nausea / vomiting. 30 tablet 4  . oxyCODONE (OXY IR/ROXICODONE) 5 MG immediate release tablet Take 2 tablets (10 mg total) by mouth every 6 (six) hours as needed for up to 30 days for  severe pain. 90 tablet 0  . prochlorperazine (COMPAZINE) 10 MG tablet Take 1 tablet (10 mg total) by mouth every 6 (six) hours as needed (Nausea or vomiting). 30 tablet 4  . silver sulfADIAZINE (SILVADENE) 1 % cream Apply 1 application topically 2 (two) times daily. 50 g 2   No current facility-administered medications for this visit.    Facility-Administered Medications Ordered in Other Visits  Medication Dose Route Frequency   Provider Last Rate Last Dose  . 0.9 %  sodium chloride infusion   Intravenous Once Tish Men, MD 10 mL/hr at 12/07/18 1131    . heparin lock flush 100 unit/mL  500 Units Intracatheter Once PRN Tish Men, MD      . sodium chloride flush (NS) 0.9 % injection 10 mL  10 mL Intracatheter PRN Tish Men, MD        PHYSICAL EXAMINATION: ECOG PERFORMANCE STATUS: 2 - Symptomatic, <50% confined to bed  There were no vitals filed for this visit. There is no height or weight on file to calculate BMI.  There were no vitals filed for this visit.  GENERAL: alert, no acute distress at rest, mild dyspnea with speaking SKIN: skin color, texture, turgor are normal, no rashes or significant lesions EYES: conjunctiva are pink and non-injected, sclera clear OROPHARYNX: no exudate, no erythema; lips, buccal mucosa, and tongue normal  NECK: supple, non-tender LUNGS: decreased breath sound in the lung bases, no wheezing  HEART: regular rate & rhythm and no murmurs and no lower extremity edema ABDOMEN: soft, non-tender, non-distended, normal bowel sounds Musculoskeletal: no cyanosis of digits and no clubbing  PSYCH: alert & oriented x 3, fragmented speech due to dyspnea  NEURO: no focal motor/sensory deficits  LABORATORY DATA:  I have reviewed the data as listed    Component Value Date/Time   NA 136 12/07/2018 0920   K 3.7 12/07/2018 0920   CL 99 12/07/2018 0920   CO2 28 12/07/2018 0920   GLUCOSE 137 (H) 12/07/2018 0920   BUN 6 12/07/2018 0920   CREATININE 0.73 12/07/2018 0920    CALCIUM 9.0 12/07/2018 0920   PROT 6.7 12/07/2018 0920   ALBUMIN 3.0 (L) 12/07/2018 0920   AST 30 12/07/2018 0920   ALT 29 12/07/2018 0920   ALKPHOS 178 (H) 12/07/2018 0920   BILITOT 0.6 12/07/2018 0920   GFRNONAA >60 12/07/2018 0920   GFRAA >60 12/07/2018 0920    No results found for: SPEP, UPEP  Lab Results  Component Value Date   WBC 22.0 (H) 12/07/2018   NEUTROABS 20.0 (H) 12/07/2018   HGB 7.3 (L) 12/07/2018   HCT 23.6 (L) 12/07/2018   MCV 87.7 12/07/2018   PLT 492 (H) 12/07/2018      Chemistry      Component Value Date/Time   NA 136 12/07/2018 0920   K 3.7 12/07/2018 0920   CL 99 12/07/2018 0920   CO2 28 12/07/2018 0920   BUN 6 12/07/2018 0920   CREATININE 0.73 12/07/2018 0920      Component Value Date/Time   CALCIUM 9.0 12/07/2018 0920   ALKPHOS 178 (H) 12/07/2018 0920   AST 30 12/07/2018 0920   ALT 29 12/07/2018 0920   BILITOT 0.6 12/07/2018 0920       RADIOGRAPHIC STUDIES: I have personally reviewed the radiological images as listed below and agreed with the findings in the report. Dg Chest 2 View  Result Date: 11/30/2018 CLINICAL DATA:  Increasing shortness of breath. History of metastatic breast cancer. EXAM: CHEST - 2 VIEW COMPARISON:  Chest x-ray dated November 01, 2018. FINDINGS: Unchanged right chest wall port catheter. PleurX catheter in the left lung base. The heart size and mediastinal contours are within normal limits. Normal pulmonary vascularity. New moderate left pleural effusion. Multiple bilateral pulmonary nodules are grossly unchanged. No pneumothorax. No acute osseous abnormality. IMPRESSION: 1. Recurrent moderate left pleural effusion. 2. Grossly unchanged pulmonary metastatic disease. Electronically Signed   By: Titus Dubin  M.D.   On: 11/30/2018 11:55   Ct Angio Chest Pe W Or Wo Contrast  Result Date: 12/07/2018 CLINICAL DATA:  Shortness of breath. History of metastatic breast cancer. EXAM: CT ANGIOGRAPHY CHEST WITH CONTRAST  TECHNIQUE: Multidetector CT imaging of the chest was performed using the standard protocol during bolus administration of intravenous contrast. Multiplanar CT image reconstructions and MIPs were obtained to evaluate the vascular anatomy. CONTRAST:  100mL OMNIPAQUE IOHEXOL 350 MG/ML SOLN COMPARISON:  CT scan of October 17, 2018. FINDINGS: Cardiovascular: There is no definite evidence of large central pulmonary embolus, although evaluation of the segmental level is significantly limited due to body habitus and motion artifact. Mild cardiomegaly is noted. No pericardial effusion is noted. Thoracic aorta is unremarkable. Mediastinum/Nodes: Thyroid gland and esophagus are unremarkable. Stable mediastinal adenopathy is noted consistent with metastatic disease. Stable bilateral axillary adenopathy is also noted, left greater than right, consistent with metastatic disease. Lungs/Pleura: No pneumothorax is noted. Bilateral pulmonary metastatic lesions are noted. Multiple loculated pleural effusions are noted in the left hemithorax, particularly within the left major fissure. There is been interval placement of chest tube into the left lung base. Left lower lobe opacity is noted concerning for pneumonia or atelectasis. Upper Abdomen: No acute abnormality. Musculoskeletal: There is interval development of multiple sclerotic densities within the thoracic spine concerning for possible metastatic disease. Large left breast mass with surrounding edema is again noted. Review of the MIP images confirms the above findings. IMPRESSION: No definite evidence of large central pulmonary embolus, although evaluation of the segmental level is significantly limited due to body habitus and motion artifact. Smaller peripheral pulmonary emboli cannot be excluded on the basis of this exam. Bilateral pulmonary metastatic lesions are again noted. Multiple loculated left pleural effusions are noted in the left hemithorax, with interval placement of  chest tube into the left lung base. Left lower lobe pneumonia or atelectasis is noted. Stable mediastinal adenopathy is noted consistent with metastatic disease. Interval development of multiple sclerotic densities are noted within the thoracic spine concerning for metastatic disease. Stable presence of large left breast mass is noted with surrounding edema, consistent with malignancy. Electronically Signed   By: James  Green Jr M.D.   On: 12/07/2018 11:33   Ir Sinus/fist Tube Chk-non Gi  Result Date: 12/06/2018 INDICATION: 42-year-old female with a history of stage IV inflammatory left breast cancer with pulmonary metastases and malignant left pleural effusion. She had a tunneled pleural drainage catheter placed on 11/01/2018. Initially, drainage was good, however her drainage catheter has been putting out minimal fluid over the last several days. She feels short of breath and tight in her left chest raising concern that she has recurrent undrained pleural fluid. She presents today for evaluation and injection of the existing pleural drainage catheter as well as ultrasound evaluation of the chest with possible thoracentesis. EXAM: IR PERC PLEURAL DRAIN W/INDWELL CATH W/IMG GUIDE MEDICATIONS: None ANESTHESIA/SEDATION: None COMPLICATIONS: None immediate. PROCEDURE: Informed written consent was obtained from the patient after a thorough discussion of the procedural risks, benefits and alternatives. All questions were addressed. Maximal Sterile Barrier Technique was utilized including caps, mask, sterile gowns, sterile gloves, sterile drape, hand hygiene and skin antiseptic. A timeout was performed prior to the initiation of the procedure. The drainage catheter was evaluated under fluoroscopy. The drain appears to be in the expected location in the left lung base. The drainage catheter flushes easily with saline. A drain injection was then performed using digital subtraction angiography. No evidence of catheter    leak or obstruction. The injected contrast material opacifies a small collapsed compartment surrounding the drainage catheter. Ultrasound was then used to interrogate the left pleural space. There is no evidence of significant recurrent pleural effusion in the dependent portion of the left pleural space. IMPRESSION: 1. Drain injection reveals a patent and normally functioning left-sided tunneled pleural drainage catheter. The pleural effusion appears largely resolved. 2. No evidence of recurrent large volume pleural effusion by ultrasound. Consider further evaluation with CT scan of the chest. Electronically Signed   By: Heath  McCullough M.D.   On: 12/06/2018 15:40   Ir Us Chest  Result Date: 12/06/2018 CLINICAL DATA:  42-year-old female with malignant left pleural effusion. Evaluate for recurrence EXAM: CHEST ULTRASOUND COMPARISON:  Prior thoracentesis 10/27/2018 FINDINGS: The left chest was interrogated with ultrasound. There is no significant pleural fluid visible. IMPRESSION: No evidence of recurrent left-sided pleural effusion. Exam slightly limited by patient body habitus. Electronically Signed   By: Heath  McCullough M.D.   On: 12/06/2018 15:53   

## 2018-12-07 ENCOUNTER — Ambulatory Visit (HOSPITAL_BASED_OUTPATIENT_CLINIC_OR_DEPARTMENT_OTHER)
Admission: RE | Admit: 2018-12-07 | Discharge: 2018-12-07 | Disposition: A | Discharge status: Home / Self Care | Payer: BLUE CROSS/BLUE SHIELD | Source: Ambulatory Visit | Attending: Hematology | Admitting: Hematology

## 2018-12-07 ENCOUNTER — Other Ambulatory Visit: Payer: Self-pay | Admitting: *Deleted

## 2018-12-07 ENCOUNTER — Ambulatory Visit: Payer: BLUE CROSS/BLUE SHIELD | Admitting: Hematology

## 2018-12-07 ENCOUNTER — Inpatient Hospital Stay: Payer: BLUE CROSS/BLUE SHIELD

## 2018-12-07 ENCOUNTER — Other Ambulatory Visit: Payer: BLUE CROSS/BLUE SHIELD

## 2018-12-07 ENCOUNTER — Other Ambulatory Visit: Payer: Self-pay | Admitting: Family

## 2018-12-07 ENCOUNTER — Ambulatory Visit: Payer: BLUE CROSS/BLUE SHIELD

## 2018-12-07 ENCOUNTER — Encounter: Payer: Self-pay | Admitting: Hematology

## 2018-12-07 ENCOUNTER — Inpatient Hospital Stay (HOSPITAL_BASED_OUTPATIENT_CLINIC_OR_DEPARTMENT_OTHER): Payer: BLUE CROSS/BLUE SHIELD | Admitting: Hematology

## 2018-12-07 DIAGNOSIS — Z171 Estrogen receptor negative status [ER-]: Principal | ICD-10-CM

## 2018-12-07 DIAGNOSIS — C7800 Secondary malignant neoplasm of unspecified lung: Secondary | ICD-10-CM

## 2018-12-07 DIAGNOSIS — C50812 Malignant neoplasm of overlapping sites of left female breast: Secondary | ICD-10-CM

## 2018-12-07 DIAGNOSIS — D638 Anemia in other chronic diseases classified elsewhere: Secondary | ICD-10-CM | POA: Diagnosis not present

## 2018-12-07 DIAGNOSIS — D72829 Elevated white blood cell count, unspecified: Secondary | ICD-10-CM | POA: Diagnosis not present

## 2018-12-07 DIAGNOSIS — R0602 Shortness of breath: Secondary | ICD-10-CM | POA: Diagnosis not present

## 2018-12-07 DIAGNOSIS — D649 Anemia, unspecified: Secondary | ICD-10-CM

## 2018-12-07 DIAGNOSIS — C7931 Secondary malignant neoplasm of brain: Secondary | ICD-10-CM

## 2018-12-07 DIAGNOSIS — G893 Neoplasm related pain (acute) (chronic): Secondary | ICD-10-CM | POA: Diagnosis not present

## 2018-12-07 DIAGNOSIS — J910 Malignant pleural effusion: Secondary | ICD-10-CM

## 2018-12-07 DIAGNOSIS — Z5111 Encounter for antineoplastic chemotherapy: Secondary | ICD-10-CM | POA: Diagnosis not present

## 2018-12-07 DIAGNOSIS — R7989 Other specified abnormal findings of blood chemistry: Secondary | ICD-10-CM

## 2018-12-07 DIAGNOSIS — Z79899 Other long term (current) drug therapy: Secondary | ICD-10-CM | POA: Diagnosis not present

## 2018-12-07 LAB — CMP (CANCER CENTER ONLY)
ALT: 29 U/L (ref 0–44)
AST: 30 U/L (ref 15–41)
Albumin: 3 g/dL — ABNORMAL LOW (ref 3.5–5.0)
Alkaline Phosphatase: 178 U/L — ABNORMAL HIGH (ref 38–126)
Anion gap: 9 (ref 5–15)
BUN: 6 mg/dL (ref 6–20)
CO2: 28 mmol/L (ref 22–32)
Calcium: 9 mg/dL (ref 8.9–10.3)
Chloride: 99 mmol/L (ref 98–111)
Creatinine: 0.73 mg/dL (ref 0.44–1.00)
GFR, Est AFR Am: >60 mL/min (ref >60)
GFR, Estimated: >60 mL/min (ref >60)
Glucose, Bld: 137 mg/dL — ABNORMAL HIGH (ref 70–99)
Potassium: 3.7 mmol/L (ref 3.5–5.1)
Sodium: 136 mmol/L (ref 135–145)
Total Bilirubin: 0.6 mg/dL (ref 0.3–1.2)
Total Protein: 6.7 g/dL (ref 6.5–8.1)

## 2018-12-07 LAB — CBC WITH DIFFERENTIAL (CANCER CENTER ONLY)
Abs Immature Granulocytes: 0.47 10*3/uL — ABNORMAL HIGH (ref 0.00–0.07)
Basophils Absolute: 0 10*3/uL (ref 0.0–0.1)
Basophils Relative: 0 %
Eosinophils Absolute: 0.1 10*3/uL (ref 0.0–0.5)
Eosinophils Relative: 0 %
HCT: 23.6 % — ABNORMAL LOW (ref 36.0–46.0)
Hemoglobin: 7.3 g/dL — ABNORMAL LOW (ref 12.0–15.0)
Immature Granulocytes: 2 %
Lymphocytes Relative: 1 %
Lymphs Abs: 0.3 10*3/uL — ABNORMAL LOW (ref 0.7–4.0)
MCH: 27.1 pg (ref 26.0–34.0)
MCHC: 30.9 g/dL (ref 30.0–36.0)
MCV: 87.7 fL (ref 80.0–100.0)
Monocytes Absolute: 1.2 10*3/uL — ABNORMAL HIGH (ref 0.1–1.0)
Monocytes Relative: 5 %
Neutro Abs: 20 10*3/uL — ABNORMAL HIGH (ref 1.7–7.7)
Neutrophils Relative %: 92 %
Platelet Count: 492 10*3/uL — ABNORMAL HIGH (ref 150–400)
RBC: 2.69 MIL/uL — ABNORMAL LOW (ref 3.87–5.11)
RDW: 16.2 % — ABNORMAL HIGH (ref 11.5–15.5)
WBC Count: 22 10*3/uL — ABNORMAL HIGH (ref 4.0–10.5)
nRBC: 0 % (ref 0.0–0.2)

## 2018-12-07 LAB — PREPARE RBC (CROSSMATCH)

## 2018-12-07 LAB — MAGNESIUM: Magnesium: 1.7 mg/dL (ref 1.7–2.4)

## 2018-12-07 LAB — SAMPLE TO BLOOD BANK

## 2018-12-07 MED ORDER — SODIUM CHLORIDE 0.9% FLUSH
10.0000 mL | INTRAVENOUS | Status: DC | PRN
  Administered 2018-12-07: 13:00:00 10 mL
  Filled 2018-12-07: qty 10

## 2018-12-07 MED ORDER — HEPARIN SOD (PORK) LOCK FLUSH 100 UNIT/ML IV SOLN
500.0000 [IU] | Freq: Once | INTRAVENOUS | Status: AC | PRN
  Administered 2018-12-07: 13:00:00 500 [IU]
  Filled 2018-12-07: qty 5

## 2018-12-07 MED ORDER — SODIUM CHLORIDE 0.9 % IV SOLN
Freq: Once | INTRAVENOUS | Status: DC
  Filled 2018-12-07: qty 250

## 2018-12-07 MED ORDER — SODIUM CHLORIDE 0.9 % IV SOLN
510.0000 mg | Freq: Once | INTRAVENOUS | Status: AC
  Administered 2018-12-07: 510 mg via INTRAVENOUS
  Filled 2018-12-07: qty 17

## 2018-12-07 MED ORDER — IOHEXOL 350 MG/ML SOLN
100.0000 mL | Freq: Once | INTRAVENOUS | Status: AC | PRN
  Administered 2018-12-07: 100 mL via INTRAVENOUS

## 2018-12-07 NOTE — Progress Notes (Signed)
  Radiation Oncology         [phone] ________________________________  Name: Sharon Floyd MRN: [id number]  Date: 10/18/2018  DOB: 1975-09-02  Optical Surface Tracking Plan:  Since intensity modulated radiotherapy (IMRT) and 3D conformal radiation treatment methods are predicated on accurate and precise positioning for treatment, intrafraction motion monitoring is medically necessary to ensure accurate and safe treatment delivery.  The ability to quantify intrafraction motion without excessive ionizing radiation dose can only be performed with optical surface tracking. Accordingly, surface imaging offers the opportunity to obtain 3D measurements of patient position throughout IMRT and 3D treatments without excessive radiation exposure.  I am ordering optical surface tracking for this patient's upcoming course of radiotherapy. ________________________________  Kyung Rudd, MD 12/07/2018 9:55 AM    Reference:   Particia Jasper, et al. Surface imaging-based analysis of intrafraction motion for breast radiotherapy patients.Journal of [place], n. 6, nov. 2014. ISSN [id number].   Available at: <http://www.jacmp.org/index.php/jacmp/article/view/4957>.

## 2018-12-07 NOTE — Patient Instructions (Signed)

## 2018-12-07 NOTE — Progress Notes (Signed)
  Radiation Oncology         [phone] ________________________________  Name: Sharon Floyd MRN: [id number]  Date: 10/18/2018  DOB: 04-Mar-1976   DIAGNOSIS:     ICD-10-CM   1. Malignant neoplasm of overlapping sites of left breast in female, estrogen receptor negative ([place]) C50.812    Z17.1     SIMULATION AND TREATMENT PLANNING NOTE  The patient presented for simulation prior to beginning her course of radiation treatment for her diagnosis of left-sided breast cancer. The patient was placed in a supine position on a breast board. A customized vac-lock bag was constructed and this complex treatment device will be used on a daily basis during her treatment. In this fashion, a CT scan was obtained through the chest area and an isocenter was placed near the chest wall within the breast.  The patient will be planned to receive a course of radiation to a dose of 36 Gy. This will consist of a whole breast radiotherapy technique. To accomplish this, 2 customized blocks have been designed which will correspond to medial and lateral whole breast tangent fields. This treatment will be accomplished at 3 Gy per fraction. A forward planning technique will also be evaluated to determine if this approach improves the plan.   This initial treatment will consist of a 3-D conformal technique. The seroma has been contoured as the primary target structure. Additionally, dose volume histograms of both this target as well as the lungs and heart will also be evaluated. Such an approach is necessary to ensure that the target area is adequately covered while the nearby critical normal structures are adequately spared.  Plan:  The anticipated total dose therefore will correspond to 36 Gy.    _______________________________   Jodelle Gross, MD, PhD

## 2018-12-07 NOTE — Progress Notes (Signed)
  Radiation Oncology         [phone] ________________________________  Name: Sharon Floyd MRN: [id number]  Date: 11/08/2018  DOB: 07/23/1976   SPECIAL TREATMENT PROCEDURE   3D TREATMENT PLANNING AND DOSIMETRY: The patient's radiation plan was reviewed and approved by Dr. Vertell Limber from neurosurgery and radiation oncology prior to treatment. It showed 3-dimensional radiation distributions overlaid onto the planning CT/MRI image set. The [facility] for the target structures as well as the organs at risk were reviewed. The documentation of the 3D plan and dosimetry are filed in the radiation oncology EMR.   NARRATIVE: The patient was brought to the TrueBeam stereotactic radiation treatment machine and placed supine on the CT couch. The head frame was applied, and the patient was set up for stereotactic radiosurgery. Neurosurgery was present for the set-up and delivery   SIMULATION VERIFICATION: In the couch zero-angle position, the patient underwent Exactrac imaging using the Brainlab system with orthogonal KV images. These were carefully aligned and repeated to confirm treatment position for each of the isocenters. The Exactrac snap film verification was repeated at each couch angle.   SPECIAL TREATMENT PROCEDURE: The patient received stereotactic radiosurgery to the following target:  PTV1 target was treated using 3 Arcs to a prescription dose of 20 Gy. ExacTrac Snap verification was performed for each couch angle.   STEREOTACTIC TREATMENT MANAGEMENT: Following delivery, the patient was transported to nursing in stable condition and monitored for possible acute effects. Vital signs were recorded . The patient tolerated treatment without significant acute effects, and was discharged to home in stable condition.  PLAN: The patient will continue palliative XRT to the left breast.   ------------------------------------------------  Jodelle Gross, MD, PhD

## 2018-12-07 NOTE — Patient Instructions (Signed)

## 2018-12-08 ENCOUNTER — Other Ambulatory Visit: Payer: Self-pay

## 2018-12-08 ENCOUNTER — Inpatient Hospital Stay: Payer: BLUE CROSS/BLUE SHIELD | Attending: Hematology

## 2018-12-08 DIAGNOSIS — E876 Hypokalemia: Secondary | ICD-10-CM | POA: Insufficient documentation

## 2018-12-08 DIAGNOSIS — D638 Anemia in other chronic diseases classified elsewhere: Secondary | ICD-10-CM | POA: Insufficient documentation

## 2018-12-08 DIAGNOSIS — Z5111 Encounter for antineoplastic chemotherapy: Secondary | ICD-10-CM | POA: Diagnosis not present

## 2018-12-08 DIAGNOSIS — C7931 Secondary malignant neoplasm of brain: Secondary | ICD-10-CM | POA: Insufficient documentation

## 2018-12-08 DIAGNOSIS — D72829 Elevated white blood cell count, unspecified: Secondary | ICD-10-CM | POA: Insufficient documentation

## 2018-12-08 DIAGNOSIS — D649 Anemia, unspecified: Secondary | ICD-10-CM

## 2018-12-08 DIAGNOSIS — Z79899 Other long term (current) drug therapy: Secondary | ICD-10-CM | POA: Insufficient documentation

## 2018-12-08 DIAGNOSIS — C7800 Secondary malignant neoplasm of unspecified lung: Secondary | ICD-10-CM | POA: Diagnosis not present

## 2018-12-08 DIAGNOSIS — R7989 Other specified abnormal findings of blood chemistry: Secondary | ICD-10-CM | POA: Insufficient documentation

## 2018-12-08 DIAGNOSIS — Z171 Estrogen receptor negative status [ER-]: Principal | ICD-10-CM

## 2018-12-08 DIAGNOSIS — J910 Malignant pleural effusion: Secondary | ICD-10-CM | POA: Insufficient documentation

## 2018-12-08 DIAGNOSIS — C50812 Malignant neoplasm of overlapping sites of left female breast: Secondary | ICD-10-CM

## 2018-12-08 DIAGNOSIS — G893 Neoplasm related pain (acute) (chronic): Secondary | ICD-10-CM | POA: Diagnosis not present

## 2018-12-08 MED ORDER — SODIUM CHLORIDE 0.9% FLUSH
10.0000 mL | INTRAVENOUS | Status: AC | PRN
  Administered 2018-12-08: 10 mL
  Filled 2018-12-08: qty 10

## 2018-12-08 MED ORDER — ACETAMINOPHEN 325 MG PO TABS
650.0000 mg | ORAL_TABLET | Freq: Once | ORAL | Status: AC
  Administered 2018-12-08: 650 mg via ORAL

## 2018-12-08 MED ORDER — ACETAMINOPHEN 325 MG PO TABS
650.0000 mg | ORAL_TABLET | Freq: Once | ORAL | Status: AC
  Administered 2018-12-08: 16:00:00 650 mg via ORAL

## 2018-12-08 MED ORDER — HEPARIN SOD (PORK) LOCK FLUSH 100 UNIT/ML IV SOLN
250.0000 [IU] | INTRAVENOUS | Status: AC | PRN
  Administered 2018-12-08: 16:00:00 500 [IU]
  Filled 2018-12-08: qty 5

## 2018-12-08 MED ORDER — DIPHENHYDRAMINE HCL 25 MG PO CAPS
ORAL_CAPSULE | ORAL | Status: AC
  Filled 2018-12-08: qty 1

## 2018-12-08 MED ORDER — FUROSEMIDE 10 MG/ML IJ SOLN: 20.0000 mg | Freq: Once | INTRAMUSCULAR | Status: DC

## 2018-12-08 MED ORDER — ACETAMINOPHEN 325 MG PO TABS
ORAL_TABLET | ORAL | Status: AC
  Filled 2018-12-08: qty 2

## 2018-12-08 MED ORDER — DIPHENHYDRAMINE HCL 25 MG PO CAPS
25.0000 mg | ORAL_CAPSULE | Freq: Once | ORAL | Status: AC
  Administered 2018-12-08: 25 mg via ORAL

## 2018-12-08 NOTE — Patient Instructions (Signed)
Anemia  Anemia is a condition in which you do not have enough red blood cells or hemoglobin. Hemoglobin is a substance in red blood cells that carries oxygen. When you do not have enough red blood cells or hemoglobin (are anemic), your body cannot get enough oxygen and your organs may not work properly. As a result, you may feel very tired or have other problems. What are the causes? Common causes of anemia include:  Excessive bleeding. Anemia can be caused by excessive bleeding inside or outside the body, including bleeding from the intestine or from periods in women.  Poor nutrition.  Long-lasting (chronic) kidney, thyroid, and liver disease.  Bone marrow disorders.  Cancer and treatments for cancer.  HIV (human immunodeficiency virus) and AIDS (acquired immunodeficiency syndrome).  Treatments for HIV and AIDS.  Spleen problems.  Blood disorders.  Infections, medicines, and autoimmune disorders that destroy red blood cells. What are the signs or symptoms? Symptoms of this condition include:  Minor weakness.  Dizziness.  Headache.  Feeling heartbeats that are irregular or faster than normal (palpitations).  Shortness of breath, especially with exercise.  Paleness.  Cold sensitivity.  Indigestion.  Nausea.  Difficulty sleeping.  Difficulty concentrating. Symptoms may occur suddenly or develop slowly. If your anemia is mild, you may not have symptoms. How is this diagnosed? This condition is diagnosed based on:  Blood tests.  Your medical history.  A physical exam.  Bone marrow biopsy. Your health care provider may also check your stool (feces) for blood and may do additional testing to look for the cause of your bleeding. You may also have other tests, including:  Imaging tests, such as a CT scan or MRI.  Endoscopy.  Colonoscopy. How is this treated? Treatment for this condition depends on the cause. If you continue to lose a lot of blood, you may  need to be treated at a hospital. Treatment may include:  Taking supplements of iron, vitamin C94, or folic acid.  Taking a hormone medicine (erythropoietin) that can help to stimulate red blood cell growth.  Having a blood transfusion. This may be needed if you lose a lot of blood.  Making changes to your diet.  Having surgery to remove your spleen. Follow these instructions at home:  Take over-the-counter and prescription medicines only as told by your health care provider.  Take supplements only as told by your health care provider.  Follow any diet instructions that you were given.  Keep all follow-up visits as told by your health care provider. This is important. Contact a health care provider if:  You develop new bleeding anywhere in the body. Get help right away if:  You are very weak.  You are short of breath.  You have pain in your abdomen or chest.  You are dizzy or feel faint.  You have trouble concentrating.  You have bloody or black, tarry stools.  You vomit repeatedly or you vomit up blood. Summary  Anemia is a condition in which you do not have enough red blood cells or enough of a substance in your red blood cells that carries oxygen (hemoglobin).  Symptoms may occur suddenly or develop slowly.  If your anemia is mild, you may not have symptoms.  This condition is diagnosed with blood tests as well as a medical history and physical exam. Other tests may be needed.  Treatment for this condition depends on the cause of the anemia. This information is not intended to replace advice given to you by  your health care provider. Make sure you discuss any questions you have with your health care provider. Document Released: 09/02/2004 Document Revised: 08/27/2016 Document Reviewed: 08/27/2016 Elsevier Interactive Patient Education  2019 [employer].

## 2018-12-09 LAB — BPAM RBC
Blood Product Expiration Date: 202005052359
Blood Product Expiration Date: 202005062359
ISSUE DATE / TIME: 202005010736
ISSUE DATE / TIME: 202005010736
Unit Type and Rh: 6200
Unit Type and Rh: 6200

## 2018-12-09 LAB — TYPE AND SCREEN
ABO/RH(D): A POS
Antibody Screen: NEGATIVE
Unit division: 0
Unit division: 0

## 2018-12-13 ENCOUNTER — Other Ambulatory Visit: Payer: Self-pay | Admitting: Hematology

## 2018-12-14 ENCOUNTER — Encounter: Payer: Self-pay | Admitting: Hematology

## 2018-12-14 ENCOUNTER — Other Ambulatory Visit: Payer: Self-pay

## 2018-12-14 ENCOUNTER — Inpatient Hospital Stay (HOSPITAL_BASED_OUTPATIENT_CLINIC_OR_DEPARTMENT_OTHER): Payer: BLUE CROSS/BLUE SHIELD | Admitting: Hematology

## 2018-12-14 ENCOUNTER — Ambulatory Visit: Payer: BLUE CROSS/BLUE SHIELD

## 2018-12-14 ENCOUNTER — Inpatient Hospital Stay: Payer: BLUE CROSS/BLUE SHIELD

## 2018-12-14 VITALS — BP 125/83 | HR 97 | Temp 97.9°F | Resp 19 | Ht 64.0 in | Wt 233.0 lb

## 2018-12-14 DIAGNOSIS — D72829 Elevated white blood cell count, unspecified: Secondary | ICD-10-CM

## 2018-12-14 DIAGNOSIS — Z5111 Encounter for antineoplastic chemotherapy: Secondary | ICD-10-CM | POA: Diagnosis not present

## 2018-12-14 DIAGNOSIS — R7989 Other specified abnormal findings of blood chemistry: Secondary | ICD-10-CM | POA: Diagnosis not present

## 2018-12-14 DIAGNOSIS — C7931 Secondary malignant neoplasm of brain: Secondary | ICD-10-CM | POA: Diagnosis not present

## 2018-12-14 DIAGNOSIS — J910 Malignant pleural effusion: Secondary | ICD-10-CM | POA: Diagnosis not present

## 2018-12-14 DIAGNOSIS — G893 Neoplasm related pain (acute) (chronic): Secondary | ICD-10-CM

## 2018-12-14 DIAGNOSIS — C7800 Secondary malignant neoplasm of unspecified lung: Secondary | ICD-10-CM

## 2018-12-14 DIAGNOSIS — D638 Anemia in other chronic diseases classified elsewhere: Secondary | ICD-10-CM | POA: Diagnosis not present

## 2018-12-14 DIAGNOSIS — Z171 Estrogen receptor negative status [ER-]: Principal | ICD-10-CM

## 2018-12-14 DIAGNOSIS — C50812 Malignant neoplasm of overlapping sites of left female breast: Secondary | ICD-10-CM

## 2018-12-14 DIAGNOSIS — E876 Hypokalemia: Secondary | ICD-10-CM | POA: Diagnosis not present

## 2018-12-14 DIAGNOSIS — D649 Anemia, unspecified: Secondary | ICD-10-CM

## 2018-12-14 DIAGNOSIS — Z79899 Other long term (current) drug therapy: Secondary | ICD-10-CM | POA: Diagnosis not present

## 2018-12-14 LAB — CMP (CANCER CENTER ONLY)
ALT: 45 U/L — ABNORMAL HIGH (ref 0–44)
AST: 41 U/L (ref 15–41)
Albumin: 3 g/dL — ABNORMAL LOW (ref 3.5–5.0)
Alkaline Phosphatase: 176 U/L — ABNORMAL HIGH (ref 38–126)
Anion gap: 10 (ref 5–15)
BUN: 8 mg/dL (ref 6–20)
CO2: 28 mmol/L (ref 22–32)
Calcium: 9.6 mg/dL (ref 8.9–10.3)
Chloride: 99 mmol/L (ref 98–111)
Creatinine: 0.6 mg/dL (ref 0.44–1.00)
GFR, Est AFR Am: >60 mL/min (ref >60)
GFR, Estimated: >60 mL/min (ref >60)
Glucose, Bld: 117 mg/dL — ABNORMAL HIGH (ref 70–99)
Potassium: 4.4 mmol/L (ref 3.5–5.1)
Sodium: 137 mmol/L (ref 135–145)
Total Bilirubin: 0.5 mg/dL (ref 0.3–1.2)
Total Protein: 7.2 g/dL (ref 6.5–8.1)

## 2018-12-14 LAB — CBC WITH DIFFERENTIAL (CANCER CENTER ONLY)
Abs Immature Granulocytes: 0.27 10*3/uL — ABNORMAL HIGH (ref 0.00–0.07)
Basophils Absolute: 0 10*3/uL (ref 0.0–0.1)
Basophils Relative: 0 %
Eosinophils Absolute: 0 10*3/uL (ref 0.0–0.5)
Eosinophils Relative: 0 %
HCT: 29.7 % — ABNORMAL LOW (ref 36.0–46.0)
Hemoglobin: 9 g/dL — ABNORMAL LOW (ref 12.0–15.0)
Immature Granulocytes: 2 %
Lymphocytes Relative: 3 %
Lymphs Abs: 0.4 10*3/uL — ABNORMAL LOW (ref 0.7–4.0)
MCH: 26.8 pg (ref 26.0–34.0)
MCHC: 30.3 g/dL (ref 30.0–36.0)
MCV: 88.4 fL (ref 80.0–100.0)
Monocytes Absolute: 0.5 10*3/uL (ref 0.1–1.0)
Monocytes Relative: 3 %
Neutro Abs: 14.2 10*3/uL — ABNORMAL HIGH (ref 1.7–7.7)
Neutrophils Relative %: 92 %
Platelet Count: 671 10*3/uL — ABNORMAL HIGH (ref 150–400)
RBC: 3.36 MIL/uL — ABNORMAL LOW (ref 3.87–5.11)
RDW: 16 % — ABNORMAL HIGH (ref 11.5–15.5)
WBC Count: 15.3 10*3/uL — ABNORMAL HIGH (ref 4.0–10.5)
nRBC: 0 % (ref 0.0–0.2)

## 2018-12-14 LAB — MAGNESIUM: Magnesium: 2.2 mg/dL (ref 1.7–2.4)

## 2018-12-14 MED ORDER — SODIUM CHLORIDE 0.9 % IV SOLN
Freq: Once | INTRAVENOUS | Status: AC
  Administered 2018-12-14: 11:00:00 via INTRAVENOUS
  Filled 2018-12-14: qty 250

## 2018-12-14 MED ORDER — SODIUM CHLORIDE 0.9 % IV SOLN
420.0000 mg | Freq: Once | INTRAVENOUS | Status: AC
  Administered 2018-12-14: 420 mg via INTRAVENOUS
  Filled 2018-12-14: qty 14

## 2018-12-14 MED ORDER — DIPHENHYDRAMINE HCL 25 MG PO CAPS
ORAL_CAPSULE | ORAL | Status: AC
  Filled 2018-12-14: qty 2

## 2018-12-14 MED ORDER — ACETAMINOPHEN 325 MG PO TABS
ORAL_TABLET | ORAL | Status: AC
  Filled 2018-12-14: qty 2

## 2018-12-14 MED ORDER — SODIUM CHLORIDE 0.9% FLUSH
10.0000 mL | INTRAVENOUS | Status: DC | PRN
  Administered 2018-12-14: 10 mL
  Filled 2018-12-14: qty 10

## 2018-12-14 MED ORDER — TRASTUZUMAB CHEMO 150 MG IV SOLR
600.0000 mg | Freq: Once | INTRAVENOUS | Status: AC
  Administered 2018-12-14: 600 mg via INTRAVENOUS
  Filled 2018-12-14: qty 28.57

## 2018-12-14 MED ORDER — SODIUM CHLORIDE 0.9 % IV SOLN
510.0000 mg | Freq: Once | INTRAVENOUS | Status: AC
  Administered 2018-12-14: 510 mg via INTRAVENOUS
  Filled 2018-12-14: qty 17

## 2018-12-14 MED ORDER — DEXAMETHASONE SODIUM PHOSPHATE 10 MG/ML IJ SOLN
10.0000 mg | Freq: Once | INTRAMUSCULAR | Status: AC
  Administered 2018-12-14: 11:00:00 10 mg via INTRAVENOUS

## 2018-12-14 MED ORDER — HEPARIN SOD (PORK) LOCK FLUSH 100 UNIT/ML IV SOLN
500.0000 [IU] | Freq: Once | INTRAVENOUS | Status: AC | PRN
  Administered 2018-12-14: 500 [IU]
  Filled 2018-12-14: qty 5

## 2018-12-14 MED ORDER — DIPHENHYDRAMINE HCL 25 MG PO CAPS
50.0000 mg | ORAL_CAPSULE | Freq: Once | ORAL | Status: AC
  Administered 2018-12-14: 50 mg via ORAL

## 2018-12-14 MED ORDER — MORPHINE SULFATE ER 30 MG PO TBCR: 30.0000 mg | EXTENDED_RELEASE_TABLET | Freq: Two times a day (BID) | ORAL | 0 refills | Status: AC

## 2018-12-14 MED ORDER — SODIUM CHLORIDE 0.9 % IV SOLN
Freq: Once | INTRAVENOUS | Status: DC
  Filled 2018-12-14: qty 250

## 2018-12-14 MED ORDER — SODIUM CHLORIDE 0.9 % IV SOLN
60.0000 mg/m2 | Freq: Once | INTRAVENOUS | Status: AC
  Administered 2018-12-14: 140 mg via INTRAVENOUS
  Filled 2018-12-14: qty 14

## 2018-12-14 MED ORDER — ACETAMINOPHEN 325 MG PO TABS
650.0000 mg | ORAL_TABLET | Freq: Once | ORAL | Status: AC
  Administered 2018-12-14: 12:00:00 650 mg via ORAL

## 2018-12-14 MED ORDER — DEXAMETHASONE SODIUM PHOSPHATE 10 MG/ML IJ SOLN
INTRAMUSCULAR | Status: AC
  Filled 2018-12-14: qty 1

## 2018-12-14 NOTE — Patient Instructions (Signed)

## 2018-12-14 NOTE — Patient Instructions (Signed)
[facility] Discharge Instructions for Patients Receiving Chemotherapy  Today you received the following chemotherapy agents Herceptin, perjeta, taxotere  To help prevent nausea and vomiting after your treatment, we encourage you to take your nausea medication as directed. If you develop nausea and vomiting that is not controlled by your nausea medication, call the clinic.   BELOW ARE SYMPTOMS THAT SHOULD BE REPORTED IMMEDIATELY:  *FEVER GREATER THAN 100.5 F  *CHILLS WITH OR WITHOUT FEVER  NAUSEA AND VOMITING THAT IS NOT CONTROLLED WITH YOUR NAUSEA MEDICATION  *UNUSUAL SHORTNESS OF BREATH  *UNUSUAL BRUISING OR BLEEDING  TENDERNESS IN MOUTH AND THROAT WITH OR WITHOUT PRESENCE OF ULCERS  *URINARY PROBLEMS  *BOWEL PROBLEMS  UNUSUAL RASH Items with * indicate a potential emergency and should be followed up as soon as possible.  Feel free to call the clinic should you have any questions or concerns. The clinic phone number is [phone].  Please show the [facility] at check-in to the Emergency Department and triage nurse.

## 2018-12-14 NOTE — Progress Notes (Signed)
[place] OFFICE PROGRESS NOTE  Patient Care Team: Rakes, Connye Burkitt, FNP as PCP - General (Family Medicine) Sharon Men, MD as Medical Oncologist (Hematology) Sharon Poche, RN as Oncology Nurse Navigator  HEME/ONC OVERVIEW: 1. 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 Port in 10/2018   3. Pleur-X in late 10/2018   TREATMENT REGIMEN:  10/23/2018 - 11/07/2018: palliative RT to the left breast x 2 weeks  11/01/2018: PleurX catheter for recurrent malignant left pleural effusion   11/08/2018: SBRT to the brain lesion, 20 Gy/1 fraction  11/09/2018 - present: Taxotere, Herceptin and Perjeta (Onpro on hold)  ASSESSMENT & PLAN:   Stage IV (([id number] inflammatory left breast cancer with mets to the brain and lungs  -S/p 1 cycle of palliative THP with G-CSF support -Cycle 2 delayed due to worsening anemia, leukocytosis and possible breast wound infection -Labs adequate today, proceed with Cycle 2 of chemotherapy -Docetaxel dose reduced from 716mm2 to 6098m2 due to worsening anemia -Due to the rising leukocytosis and  thrombocytosis, I am holding G-CSF (Onpro) for future treatment, especially with docetaxel dose reduction  -q3mo56month monitoring on Herceptin, next in early 02/2019  -PRN anti-emetics: Zofran, Compazine, Ativan and dexamethasone -PRN anti-diarrheals: Imodium  Chemotherapy-associated anemia -Secondary to chemotherapy, with superimposed IDA and anemia of chronic disease -S/p 2 units RBC last week  -Hgb 9.0 today, improving -Patient denies any symptom of bleeding -We will proceed with 2nd dose of IV iron today -Continue oral iron supplement  -We will monitor for now; docetaxel dose reduction as above   -If Hgb remains persistently low despite docetaxel dose reduction and IV iron, we can consider Aranesp   Leukocytosis -Likely multifactorial, including underlying malignancy  -WBC 15.3k today, improving -Patient denies any symptoms of infection -We will monitor for now; Onpro on hold as discussed above  Thrombocytosis -Likely multifactorial, including underlying malignancy -Onpro on hold as above -Plts 671k today, slightly worsen -We will monitor it for now   Malignant left pleural effusion  -S/p PleurX placement in late 10/2018 -CTA chest showed loculated left-sided pleural effusion -I will reach out to IR and see if any intervention can be performed   Breast wound -Patient recently completed a course of Augmentin for possible superimposed cellulitis over the left breast -She is followed by wound care center for wound management   Metastasis to the brain  -S/p SBRT to the brain met on 11/08/2018 -Patient denies any new focal neurologic deficit  -Follow-up MRI to be arranged by CNS tumor board in ~3 months post-treatment   Cancer-related pain -Secondary to the breast malignancy invading and underlying muscle -Currently on MS-Contin 30 mg BID with PRN oxycodone 10mg67mrs PRN for breakthrough -Pain relatively well controlled -  I have refilled MS-Contin today -Continue regimen  above  No orders of the defined types were placed in this encounter.  All questions were answered. The patient knows to call the clinic with any problems, questions or concerns. No barriers to learning was detected.  Return on 12/26/2018 for monitoring of anemia and whether patient requires Aranesp with future chemotherapy.   Return on 01/04/2019 for labs, port flush, clinic appt and Cycle 3 of THP.   Sharon Men, MD 12/14/2018 11:29 AM  CHIEF COMPLAINT: "I am doing a little better"  INTERVAL HISTORY: Sharon Floyd returns to clinic for follow-up of metastatic breast cancer.  Patient reports that she still has moderate exertional dyspnea, limiting her to walk 10-15 steps before having to stop and catch her breath.  She still drains her Pleurx catheter approximately once a week, and most recently only drained 10 to 15 cc of pleural fluid with some scant blood.  She reported 2 episodes of diarrhea over the weekend without any hematochezia, melena, or abdominal pain, resolved with Imodium.  She takes MS Contin twice a day as well as oxycodone as needed (1-2 times per day) with adequate control of pain, primarily localized to the left breast.  She denies any other complaint today.  SUMMARY OF ONCOLOGIC HISTORY:   Malignant neoplasm of overlapping sites of breast ([name])   09/30/2018 Imaging    CT chest with contrast (at [facility]): Impressions: 1.  Dermal thickening of the multiple masses within the left breast as well as a invasive 6 cm mass of the left pectoralis major, compatible with primary breast cancer. 2.  Numerous pulmonary metastases.  Left axillary, retropectoral, supraclavicular, and upper mediastinal metastatic lymphadenopathy.  Mildly enlarged right axillary and supraclavicular lymph nodes may be metastatic. 3.  Indeterminant subcentimeter lucency in the dome of the right liver. 4.  Small left pleural effusion.    10/10/2018 Initial Diagnosis    Breast cancer ([place])    10/17/2018  Imaging    CTA chest: IMPRESSION: 1. No evidence of lobar or more central pulmonary embolus. Nondiagnostic segmental assessment. 2. Moderate left pleural effusion, increased in size from the prior CT with increasing left lung atelectasis. 3. Unchanged appearance of multiple left breast masses including an invasive mass involving the pectoralis major. 4. Unchanged left axillary, left subpectoral, and mediastinal lymphadenopathy. 5. Mild interval enlargement of multiple lung metastases and of right axillary lymph nodes.    10/18/2018 Imaging    MRI brain w/o and w/ contrast: Single 4 mm focus of enhancement within right frontal white matter probably representing metastatic disease. Attention at follow-up is recommended.    10/19/2018 Imaging    CT abdomen/pelvis w/ contrast: IMPRESSION: 1. No CT evidence of metastatic disease involving the abdomen or pelvis. 2. Left pleural effusion associated atelectasis consolidation with numerous pulmonary masses and nodules on partially imaged. Skin thickening of the left breast. Findings are in keeping with advanced breast malignancy and as seen on recent CT of the chest, 10/17/2018.    10/19/2018 Procedure    US-guided bx of left supraclavicular LN     10/19/2018 Pathology Results    Accession: [id number]  Lymph node, needle/core biopsy, left supraclavicular - METASTATIC CARCINOMA    11/09/2018 -  Chemotherapy    The patient had pegfilgrastim-cbqv [facility]) injection 6 mg, 6 mg, Subcutaneous, Once, 1 of 1 cycle Administration: 6 mg (11/10/2018) trastuzumab (HERCEPTIN) 900 mg in sodium chloride 0.9 % 250 mL chemo infusion, 966 mg, Intravenous,  Once, 2 of 6 cycles  Administration: 900 mg (11/09/2018) DOCEtaxel (TAXOTERE) 170 mg in sodium chloride 0.9 % 250 mL chemo infusion, 75 mg/m2 = 170 mg, Intravenous,  Once, 2 of 6 cycles Dose modification: 60 mg/m2 (original dose 75 mg/m2, Cycle 2, Reason: Dose not tolerated) Administration: 170 mg  (11/09/2018) pertuzumab (PERJETA) 840 mg in sodium chloride 0.9 % 250 mL chemo infusion, 840 mg, Intravenous, Once, 2 of 6 cycles Administration: 840 mg (11/09/2018)  for chemotherapy treatment.      REVIEW OF SYSTEMS:   Constitutional: ( - ) fevers, ( - )  chills , ( - ) night sweats Eyes: ( - ) blurriness of vision, ( - ) double vision, ( - ) watery eyes Ears, nose, mouth, throat, and face: ( - ) mucositis, ( - ) sore throat Respiratory: ( - ) cough, ( - ) dyspnea, ( - ) wheezes Cardiovascular: ( - ) palpitation, ( - ) chest discomfort, ( - ) lower extremity swelling Gastrointestinal:  ( - ) nausea, ( - ) heartburn, ( - ) change in bowel habits Skin: ( - ) abnormal skin rashes Lymphatics: ( - ) new lymphadenopathy, ( - ) easy bruising Neurological: ( - ) numbness, ( - ) tingling, ( - ) new weaknesses Behavioral/Psych: ( - ) mood change, ( - ) new changes  All other systems were reviewed with the patient and are negative.  I have reviewed the past medical history, past surgical history, social history and family history with the patient and they are unchanged from previous note.  ALLERGIES:  is allergic to aspirin.  MEDICATIONS:  Current Outpatient Medications  Medication Sig Dispense Refill  . amoxicillin-clavulanate (AUGMENTIN) 875-125 MG tablet Take 1 tablet by mouth 2 (two) times daily for 14 days. 28 tablet 0  . dexamethasone (DECADRON) 4 MG tablet Take 2 tablets (8 mg total) by mouth 2 (two) times daily. Start the day before Taxotere. Then daily after chemo for 2 days. 30 tablet 4  . gabapentin (NEURONTIN) 300 MG capsule Take 2 capsules (600 mg total) by mouth 2 (two) times daily. 120 capsule 5  . lidocaine-prilocaine (EMLA) cream Apply to affected area once 30 g 3  . LORazepam (ATIVAN) 0.5 MG tablet Take 1 tablet (0.5 mg total) by mouth every 6 (six) hours as needed (Nausea or vomiting). 30 tablet 0  . morphine (MS CONTIN) 30 MG 12 hr tablet Take 1 tablet (30 mg total) by mouth  every 12 (twelve) hours for 30 days. 60 tablet 0  . ondansetron (ZOFRAN) 8 MG tablet Take 1 tablet (8 mg total) by mouth 2 (two) times daily as needed for refractory nausea / vomiting. 30 tablet 4  . oxyCODONE (OXY IR/ROXICODONE) 5 MG immediate release tablet Take 2 tablets (10 mg total) by mouth every 6 (six) hours as needed for up to 30 days for severe pain. 90 tablet 0  . prochlorperazine (COMPAZINE) 10 MG tablet Take 1 tablet (10 mg total) by mouth every 6 (six) hours as needed (Nausea or vomiting). 30 tablet 4  . LORazepam (ATIVAN) 0.5 MG tablet Take 0.5 tablets (0.25 mg total) by mouth once as needed for up to 2 doses for anxiety (Prior to PET and MRI scan). (Patient not taking: Reported on 12/14/2018) 1 tablet 0   No current facility-administered medications for this visit.    Facility-Administered Medications Ordered in Other Visits  Medication Dose Route Frequency Provider Last Rate Last Dose  . acetaminophen (TYLENOL) tablet 650 mg  650 mg Oral Once Sharon Men,  MD      . dexamethasone (DECADRON) injection 10 mg  10 mg Intravenous Once Volanda Napoleon, MD      . diphenhydrAMINE (BENADRYL) capsule 50 mg  50 mg Oral Once Sharon Men, MD      . DOCEtaxel (TAXOTERE) 140 mg in sodium chloride 0.9 % 250 mL chemo infusion  60 mg/m2 (Treatment Plan Recorded) Intravenous Once Sharon Men, MD      . heparin lock flush 100 unit/mL  500 Units Intracatheter Once PRN Sharon Men, MD      . pertuzumab (PERJETA) 420 mg in sodium chloride 0.9 % 250 mL chemo infusion  420 mg Intravenous Once Sharon Men, MD      . sodium chloride flush (NS) 0.9 % injection 10 mL  10 mL Intracatheter PRN Sharon Men, MD      . trastuzumab (HERCEPTIN) 600 mg in sodium chloride 0.9 % 250 mL chemo infusion  600 mg Intravenous Once Sharon Men, MD        PHYSICAL EXAMINATION: ECOG PERFORMANCE STATUS: 2 - Symptomatic, <50% confined to bed  Today's Vitals   12/14/18 1007  BP: 125/83  Pulse: 97  Resp: 19  Temp: 97.9 F (36.6 C)   TempSrc: Oral  SpO2: 100%  Weight: 233 lb (105.7 kg)  Height: 5' 4"  (1.626 m)  PainSc: 0-No pain   Body mass index is 39.99 kg/m.  Filed Weights   12/14/18 1007  Weight: 233 lb (105.7 kg)    GENERAL: alert, no distress and comfortable EYES: conjunctiva are pink and non-injected, sclera clear OROPHARYNX: no exudate, no erythema; lips, buccal mucosa, and tongue normal  NECK: supple, non-tender LUNGS: decreased air movement in the left lung base, otherwise clear to auscultation without any wheezing or rhonchi HEART: regular rate & rhythm and no murmurs and no lower extremity edema ABDOMEN: soft, non-tender, non-distended, normal bowel sounds Musculoskeletal: no cyanosis of digits and no clubbing  PSYCH: alert & oriented x 3, fluent speech NEURO: no focal motor/sensory deficits  LABORATORY DATA:  I have reviewed the data as listed    Component Value Date/Time   NA 137 12/14/2018 1000   K 4.4 12/14/2018 1000   CL 99 12/14/2018 1000   CO2 28 12/14/2018 1000   GLUCOSE 117 (H) 12/14/2018 1000   BUN 8 12/14/2018 1000   CREATININE 0.60 12/14/2018 1000   CALCIUM 9.6 12/14/2018 1000   PROT 7.2 12/14/2018 1000   ALBUMIN 3.0 (L) 12/14/2018 1000   AST 41 12/14/2018 1000   ALT 45 (H) 12/14/2018 1000   ALKPHOS 176 (H) 12/14/2018 1000   BILITOT 0.5 12/14/2018 1000   GFRNONAA >60 12/14/2018 1000   GFRAA >60 12/14/2018 1000    No results found for: SPEP, UPEP  Lab Results  Component Value Date   WBC 15.3 (H) 12/14/2018   NEUTROABS 14.2 (H) 12/14/2018   HGB 9.0 (L) 12/14/2018   HCT 29.7 (L) 12/14/2018   MCV 88.4 12/14/2018   PLT 671 (H) 12/14/2018      Chemistry      Component Value Date/Time   NA 137 12/14/2018 1000   K 4.4 12/14/2018 1000   CL 99 12/14/2018 1000   CO2 28 12/14/2018 1000   BUN 8 12/14/2018 1000   CREATININE 0.60 12/14/2018 1000      Component Value Date/Time   CALCIUM 9.6 12/14/2018 1000   ALKPHOS 176 (H) 12/14/2018 1000   AST 41 12/14/2018 1000    ALT 45 (H) 12/14/2018 1000   BILITOT  0.5 12/14/2018 1000       RADIOGRAPHIC STUDIES: I have personally reviewed the radiological images as listed below and agreed with the findings in the report. Dg Chest 2 View  Result Date: 11/30/2018 CLINICAL DATA:  Increasing shortness of breath. History of metastatic breast cancer. EXAM: CHEST - 2 VIEW COMPARISON:  Chest x-ray dated November 01, 2018. FINDINGS: Unchanged right chest wall port catheter. PleurX catheter in the left lung base. The heart size and mediastinal contours are within normal limits. Normal pulmonary vascularity. New moderate left pleural effusion. Multiple bilateral pulmonary nodules are grossly unchanged. No pneumothorax. No acute osseous abnormality. IMPRESSION: 1. Recurrent moderate left pleural effusion. 2. Grossly unchanged pulmonary metastatic disease. Electronically Signed   By: Titus Dubin M.D.   On: 11/30/2018 11:55   Ct Angio Chest Pe W Or Wo Contrast  Result Date: 12/07/2018 CLINICAL DATA:  Shortness of breath. History of metastatic breast cancer. EXAM: CT ANGIOGRAPHY CHEST WITH CONTRAST TECHNIQUE: Multidetector CT imaging of the chest was performed using the standard protocol during bolus administration of intravenous contrast. Multiplanar CT image reconstructions and MIPs were obtained to evaluate the vascular anatomy. CONTRAST:  148m OMNIPAQUE IOHEXOL 350 MG/ML SOLN COMPARISON:  CT scan of October 17, 2018. FINDINGS: Cardiovascular: There is no definite evidence of large central pulmonary embolus, although evaluation of the segmental level is significantly limited due to body habitus and motion artifact. Mild cardiomegaly is noted. No pericardial effusion is noted. Thoracic aorta is unremarkable. Mediastinum/Nodes: Thyroid gland and esophagus are unremarkable. Stable mediastinal adenopathy is noted consistent with metastatic disease. Stable bilateral axillary adenopathy is also noted, left greater than right, consistent with  metastatic disease. Lungs/Pleura: No pneumothorax is noted. Bilateral pulmonary metastatic lesions are noted. Multiple loculated pleural effusions are noted in the left hemithorax, particularly within the left major fissure. There is been interval placement of chest tube into the left lung base. Left lower lobe opacity is noted concerning for pneumonia or atelectasis. Upper Abdomen: No acute abnormality. Musculoskeletal: There is interval development of multiple sclerotic densities within the thoracic spine concerning for possible metastatic disease. Large left breast mass with surrounding edema is again noted. Review of the MIP images confirms the above findings. IMPRESSION: No definite evidence of large central pulmonary embolus, although evaluation of the segmental level is significantly limited due to body habitus and motion artifact. Smaller peripheral pulmonary emboli cannot be excluded on the basis of this exam. Bilateral pulmonary metastatic lesions are again noted. Multiple loculated left pleural effusions are noted in the left hemithorax, with interval placement of chest tube into the left lung base. Left lower lobe pneumonia or atelectasis is noted. Stable mediastinal adenopathy is noted consistent with metastatic disease. Interval development of multiple sclerotic densities are noted within the thoracic spine concerning for metastatic disease. Stable presence of large left breast mass is noted with surrounding edema, consistent with malignancy. Electronically Signed   By: JMarijo ConceptionM.D.   On: 12/07/2018 11:33   Ir Sinus/fist Tube Chk-non Gi  Result Date: 12/06/2018 INDICATION: 43year old female with a history of stage IV inflammatory left breast cancer with pulmonary metastases and malignant left pleural effusion. She had a tunneled pleural drainage catheter placed on 11/01/2018. Initially, drainage was good, however her drainage catheter has been putting out minimal fluid over the last  several days. She feels short of breath and tight in her left chest raising concern that she has recurrent undrained pleural fluid. She presents today for evaluation and injection of the  existing pleural drainage catheter as well as ultrasound evaluation of the chest with possible thoracentesis. EXAM: IR PERC PLEURAL DRAIN W/INDWELL CATH W/IMG GUIDE MEDICATIONS: None ANESTHESIA/SEDATION: None COMPLICATIONS: None immediate. PROCEDURE: Informed written consent was obtained from the patient after a thorough discussion of the procedural risks, benefits and alternatives. All questions were addressed. Maximal Sterile Barrier Technique was utilized including caps, mask, sterile gowns, sterile gloves, sterile drape, hand hygiene and skin antiseptic. A timeout was performed prior to the initiation of the procedure. The drainage catheter was evaluated under fluoroscopy. The drain appears to be in the expected location in the left lung base. The drainage catheter flushes easily with saline. A drain injection was then performed using digital subtraction angiography. No evidence of catheter leak or obstruction. The injected contrast material opacifies a small collapsed compartment surrounding the drainage catheter. Ultrasound was then used to interrogate the left pleural space. There is no evidence of significant recurrent pleural effusion in the dependent portion of the left pleural space. IMPRESSION: 1. Drain injection reveals a patent and normally functioning left-sided tunneled pleural drainage catheter. The pleural effusion appears largely resolved. 2. No evidence of recurrent large volume pleural effusion by ultrasound. Consider further evaluation with CT scan of the chest. Electronically Signed   By: Jacqulynn Cadet M.D.   On: 12/06/2018 15:40   Ir US Chest  Result Date: 12/06/2018 CLINICAL DATA:  43 year old female with malignant left pleural effusion. Evaluate for recurrence EXAM: CHEST ULTRASOUND COMPARISON:   Prior thoracentesis 10/27/2018 FINDINGS: The left chest was interrogated with ultrasound. There is no significant pleural fluid visible. IMPRESSION: No evidence of recurrent left-sided pleural effusion. Exam slightly limited by patient body habitus. Electronically Signed   By: Jacqulynn Cadet M.D.   On: 12/06/2018 15:53

## 2018-12-18 DIAGNOSIS — J910 Malignant pleural effusion: Secondary | ICD-10-CM | POA: Diagnosis not present

## 2018-12-18 DIAGNOSIS — T8131XD Disruption of external operation (surgical) wound, not elsewhere classified, subsequent encounter: Secondary | ICD-10-CM | POA: Diagnosis not present

## 2018-12-18 DIAGNOSIS — S21002A Unspecified open wound of left breast, initial encounter: Secondary | ICD-10-CM | POA: Diagnosis not present

## 2018-12-18 DIAGNOSIS — C50919 Malignant neoplasm of unspecified site of unspecified female breast: Secondary | ICD-10-CM | POA: Diagnosis not present

## 2018-12-22 ENCOUNTER — Other Ambulatory Visit: Payer: Self-pay | Admitting: Radiation Therapy

## 2018-12-22 ENCOUNTER — Encounter: Payer: Self-pay | Admitting: *Deleted

## 2018-12-22 DIAGNOSIS — C7949 Secondary malignant neoplasm of other parts of nervous system: Secondary | ICD-10-CM

## 2018-12-22 DIAGNOSIS — C7931 Secondary malignant neoplasm of brain: Secondary | ICD-10-CM

## 2018-12-25 ENCOUNTER — Encounter (HOSPITAL_BASED_OUTPATIENT_CLINIC_OR_DEPARTMENT_OTHER): Payer: BC Managed Care – PPO | Attending: Internal Medicine

## 2018-12-25 DIAGNOSIS — S21002A Unspecified open wound of left breast, initial encounter: Secondary | ICD-10-CM | POA: Insufficient documentation

## 2018-12-25 DIAGNOSIS — C50912 Malignant neoplasm of unspecified site of left female breast: Secondary | ICD-10-CM | POA: Diagnosis not present

## 2018-12-25 DIAGNOSIS — T6591XA Toxic effect of unspecified substance, accidental (unintentional), initial encounter: Secondary | ICD-10-CM | POA: Diagnosis not present

## 2018-12-26 ENCOUNTER — Other Ambulatory Visit: Payer: Self-pay | Admitting: *Deleted

## 2018-12-26 ENCOUNTER — Other Ambulatory Visit: Payer: Self-pay

## 2018-12-26 ENCOUNTER — Inpatient Hospital Stay: Payer: BLUE CROSS/BLUE SHIELD

## 2018-12-26 ENCOUNTER — Encounter: Payer: Self-pay | Admitting: Hematology

## 2018-12-26 ENCOUNTER — Telehealth: Payer: Self-pay | Admitting: Hematology

## 2018-12-26 ENCOUNTER — Inpatient Hospital Stay (HOSPITAL_BASED_OUTPATIENT_CLINIC_OR_DEPARTMENT_OTHER): Payer: BLUE CROSS/BLUE SHIELD | Admitting: Hematology

## 2018-12-26 VITALS — BP 108/75 | HR 88 | Temp 98.1°F | Resp 18 | Ht 64.0 in | Wt 234.0 lb

## 2018-12-26 DIAGNOSIS — Z171 Estrogen receptor negative status [ER-]: Secondary | ICD-10-CM

## 2018-12-26 DIAGNOSIS — Z5111 Encounter for antineoplastic chemotherapy: Secondary | ICD-10-CM | POA: Diagnosis not present

## 2018-12-26 DIAGNOSIS — C50812 Malignant neoplasm of overlapping sites of left female breast: Secondary | ICD-10-CM

## 2018-12-26 DIAGNOSIS — C7800 Secondary malignant neoplasm of unspecified lung: Secondary | ICD-10-CM

## 2018-12-26 DIAGNOSIS — G893 Neoplasm related pain (acute) (chronic): Secondary | ICD-10-CM

## 2018-12-26 DIAGNOSIS — D638 Anemia in other chronic diseases classified elsewhere: Secondary | ICD-10-CM

## 2018-12-26 DIAGNOSIS — D649 Anemia, unspecified: Secondary | ICD-10-CM

## 2018-12-26 DIAGNOSIS — C7931 Secondary malignant neoplasm of brain: Secondary | ICD-10-CM

## 2018-12-26 DIAGNOSIS — R7989 Other specified abnormal findings of blood chemistry: Secondary | ICD-10-CM

## 2018-12-26 DIAGNOSIS — Z79899 Other long term (current) drug therapy: Secondary | ICD-10-CM

## 2018-12-26 DIAGNOSIS — D72829 Elevated white blood cell count, unspecified: Secondary | ICD-10-CM

## 2018-12-26 DIAGNOSIS — J9601 Acute respiratory failure with hypoxia: Secondary | ICD-10-CM | POA: Diagnosis not present

## 2018-12-26 DIAGNOSIS — J910 Malignant pleural effusion: Secondary | ICD-10-CM | POA: Diagnosis not present

## 2018-12-26 DIAGNOSIS — E876 Hypokalemia: Secondary | ICD-10-CM | POA: Diagnosis not present

## 2018-12-26 LAB — CBC WITH DIFFERENTIAL (CANCER CENTER ONLY)
Abs Immature Granulocytes: 0.63 10*3/uL — ABNORMAL HIGH (ref 0.00–0.07)
Basophils Absolute: 0 10*3/uL (ref 0.0–0.1)
Basophils Relative: 0 %
Eosinophils Absolute: 0 10*3/uL (ref 0.0–0.5)
Eosinophils Relative: 0 %
HCT: 24.8 % — ABNORMAL LOW (ref 36.0–46.0)
Hemoglobin: 7.4 g/dL — ABNORMAL LOW (ref 12.0–15.0)
Immature Granulocytes: 7 %
Lymphocytes Relative: 5 %
Lymphs Abs: 0.5 10*3/uL — ABNORMAL LOW (ref 0.7–4.0)
MCH: 26 pg (ref 26.0–34.0)
MCHC: 29.8 g/dL — ABNORMAL LOW (ref 30.0–36.0)
MCV: 87 fL (ref 80.0–100.0)
Monocytes Absolute: 1 10*3/uL (ref 0.1–1.0)
Monocytes Relative: 11 %
Neutro Abs: 7.1 10*3/uL (ref 1.7–7.7)
Neutrophils Relative %: 77 %
Platelet Count: 455 10*3/uL — ABNORMAL HIGH (ref 150–400)
RBC: 2.85 MIL/uL — ABNORMAL LOW (ref 3.87–5.11)
RDW: 17.9 % — ABNORMAL HIGH (ref 11.5–15.5)
WBC Count: 9.3 10*3/uL (ref 4.0–10.5)
nRBC: 0.2 % (ref 0.0–0.2)

## 2018-12-26 LAB — CMP (CANCER CENTER ONLY)
ALT: 16 U/L (ref 0–44)
AST: 19 U/L (ref 15–41)
Albumin: 3.1 g/dL — ABNORMAL LOW (ref 3.5–5.0)
Alkaline Phosphatase: 100 U/L (ref 38–126)
Anion gap: 7 (ref 5–15)
BUN: 5 mg/dL — ABNORMAL LOW (ref 6–20)
CO2: 32 mmol/L (ref 22–32)
Calcium: 8.7 mg/dL — ABNORMAL LOW (ref 8.9–10.3)
Chloride: 98 mmol/L (ref 98–111)
Creatinine: 0.6 mg/dL (ref 0.44–1.00)
GFR, Est AFR Am: >60 mL/min (ref >60)
GFR, Estimated: >60 mL/min (ref >60)
Glucose, Bld: 124 mg/dL — ABNORMAL HIGH (ref 70–99)
Potassium: 3.3 mmol/L — ABNORMAL LOW (ref 3.5–5.1)
Sodium: 137 mmol/L (ref 135–145)
Total Bilirubin: 0.6 mg/dL (ref 0.3–1.2)
Total Protein: 6.5 g/dL (ref 6.5–8.1)

## 2018-12-26 LAB — MAGNESIUM: Magnesium: 1.9 mg/dL (ref 1.7–2.4)

## 2018-12-26 MED ORDER — HEPARIN SOD (PORK) LOCK FLUSH 100 UNIT/ML IV SOLN
500.0000 [IU] | Freq: Once | INTRAVENOUS | Status: AC
  Administered 2018-12-26: 500 [IU] via INTRAVENOUS
  Filled 2018-12-26: qty 5

## 2018-12-26 MED ORDER — SODIUM CHLORIDE 0.9% FLUSH
10.0000 mL | INTRAVENOUS | Status: DC | PRN
  Administered 2018-12-26: 10 mL via INTRAVENOUS
  Filled 2018-12-26: qty 10

## 2018-12-26 MED ORDER — POTASSIUM CHLORIDE CRYS ER 20 MEQ PO TBCR: 20.0000 meq | EXTENDED_RELEASE_TABLET | Freq: Two times a day (BID) | ORAL | 5 refills | Status: DC

## 2018-12-26 NOTE — Telephone Encounter (Signed)
Infusion appt (1 unit RBC) on 12/27/2018 - No other change in her appt per 5/19 los

## 2018-12-26 NOTE — Progress Notes (Signed)
[facility] OFFICE PROGRESS NOTE  Patient Care Team: Rakes, Connye Burkitt, FNP as PCP - General (Family Medicine) Tish Men, MD as Medical Oncologist (Hematology) Cordelia Poche, RN as Oncology Nurse Navigator  HEME/ONC OVERVIEW: 1. 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; ER/PR-, HER2+ -Late 09/2018: evaluation for cough at [facility]; dermal thickening and multiple masses within the left breast and a 6cm mass invading the left pectoralis muscle on CT, consistent with primary breast malignancy, extensive thoracic adenopathy and numerous pulmonary metastases, and an indeterminate subcentimeter lucency in the dome of right liver -10/2018: admitted for malignant left pleural effusion (cytology pos for malignancy); MRI brain showed a single 35m R frontal met; CT AP negative; left supraclavicular LN bx showed metastatic carcinoma, ER/PR-, HER2+.   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 Port in 10/2018   3. Pleur-X in late 10/2018   TREATMENT REGIMEN:  10/23/2018 - 11/07/2018: palliative RT to the left breast x 2 weeks  11/01/2018: PleurX catheter for recurrent malignant left pleural effusion   11/08/2018: SBRT to the brain lesion, 20 Gy/1 fraction  11/09/2018 - present: Taxotere, Herceptin and Perjeta (Onpro on hold)  Taxotere dose reduced to 643mm2 due to anemia  ASSESSMENT & PLAN:   Stage IV ([id number] left breast cancer with mets to the brain and lungs  -S/p 2 cycles of THP in the palliative setting  -Patient tolerated the 2nd cycle relatively well after the treatment was delayed for 2 weeks due to possible breast wound infection -Cycle 3 scheduled on 01/04/2019  -[id number] monitoring on Hecerptin, next due in  early 02/2019  -PRN anti-emetics: Zofran, Compazine, Ativan and dexamethasone -PRN anti-diarrheals: Imodium  Chemotherapy-associated anemia -Secondary to chemotherapy -S/p IV iron x 2 for concurrent IDA  -Hgb 7.4, lower than last week  -Patient denies any symptom of bleeding -I have ordered 1 unit RBC to be transfused on 12/27/2018  -Taxotere dose reduced as above; continue oral iron supplement  -If anemia persists, we will consider adding Aranesp   Hypokalemia -K 3.3 today, new -Likely secondary to electrolyte wasting from chemotherapy -I have ordered KCl 50m35mID  -Periodic K monitoring   Malignant left pleural effusion  -S/p Pleurx catheter placement in late 10/2018 -Recent CTA chest showed some loculated left-sided pleural effusion -Overall, patient denies any significant worsening of exertional dyspnea -Continue weekly catheter drainage; if patient develops worsening dyspnea, we will reach out to IR for further management  Breast wound -Overall improving, followed by wound care center -Continue follow-up with wound care center for local wound management  Metastasis to the brain  -S/p SBRT to the brain met on 11/08/2018 -Patient currently denies any new focal neurologic deficit -Tentatively plan for follow-up MRI brain in early 02/2019, arranged by CNS tumor board  Cancer-related pain -Secondary to the breast malignancy and near the Pleurx catheter placement -Currently on MS-Contin 30 mg BID with PRN oxycodone 10 mg q6hrs PRN for breakthrough  -Pain relatively well controlled -Continue the pain regimen above  Orders Placed This Encounter  Procedures  . Practitioner attestation of consent    I, the ordering practitioner, attest that I have discussed with the patient the benefits, risks, side effects, alternatives, likelihood of achieving goals and potential problems during recovery for the procedure  listed.    Standing Status:   Future    Standing Expiration Date:    12/26/2019    Order Specific Question:   Procedure    Answer:   Blood Product(s)  . Complete patient signature process for consent form    Standing Status:   Future    Standing Expiration Date:   12/26/2019  . Care order/instruction    Transfuse Parameters    Standing Status:   Future    Standing Expiration Date:   12/26/2019  . Type and screen         Standing Status:   Future    Standing Expiration Date:   12/26/2019   All questions were answered. The patient knows to call the clinic with any problems, questions or concerns. No barriers to learning was detected.  A total of more than 40 minutes were spent face-to-face with the patient during this encounter and over half of that time was spent on counseling and coordination of care as outlined above.   Return on 01/04/2019 for labs, port flush, clinic appt and Cycle 3 of THP.   Tish Men, MD 12/26/2018 11:00 AM  CHIEF COMPLAINT: "I am doing okay "  INTERVAL HISTORY: Sharon Floyd returns to clinic for follow-up of metastatic breast cancer.  Patient reports that she tolerated the last cycle of chemotherapy relatively well.  Her appetite remains good.  She is able to walk approximately 20 to 25 feet before having to stop due to shortness of breath.  She reports some intermittent coughing with mostly clear, occasionally somewhat thick sputum production, but she denies any fever, chill, chest pain, change in sputum color or quantity, or hemoptysis.  She still has intermittent pain around the left breast as well as around the Pleurx catheter site, but the pain medication is working relatively well.  She is been followed by wound care clinic, and reports that wound on the left lateral breast is overall healing very well.  She drains her Pleurx approximately once a week, and the fluid occasionally contains some scant amount of blood.  She denies any other complaint today.  SUMMARY OF ONCOLOGIC HISTORY:   Malignant neoplasm of overlapping sites of  breast ([place])   09/30/2018 Imaging    CT chest with contrast (at [facility]): Impressions: 1.  Dermal thickening of the multiple masses within the left breast as well as a invasive 6 cm mass of the left pectoralis major, compatible with primary breast cancer. 2.  Numerous pulmonary metastases.  Left axillary, retropectoral, supraclavicular, and upper mediastinal metastatic lymphadenopathy.  Mildly enlarged right axillary and supraclavicular lymph nodes may be metastatic. 3.  Indeterminant subcentimeter lucency in the dome of the right liver. 4.  Small left pleural effusion.    10/10/2018 Initial Diagnosis    Breast cancer ([place])    10/17/2018 Imaging    CTA chest: IMPRESSION: 1. No evidence of lobar or more central pulmonary embolus. Nondiagnostic segmental assessment. 2. Moderate left pleural effusion, increased in size from the prior CT with increasing left lung atelectasis. 3. Unchanged appearance of multiple left breast masses including an invasive mass involving the pectoralis major. 4. Unchanged left axillary, left subpectoral, and mediastinal lymphadenopathy. 5. Mild interval enlargement of multiple lung metastases and of right axillary lymph nodes.    10/18/2018 Imaging    MRI brain w/o and w/ contrast: Single 4 mm focus of enhancement within right frontal white matter probably representing metastatic disease. Attention at follow-up is recommended.    10/19/2018 Imaging  CT abdomen/pelvis w/ contrast: IMPRESSION: 1. No CT evidence of metastatic disease involving the abdomen or pelvis. 2. Left pleural effusion associated atelectasis consolidation with numerous pulmonary masses and nodules on partially imaged. Skin thickening of the left breast. Findings are in keeping with advanced breast malignancy and as seen on recent CT of the chest, 10/17/2018.    10/19/2018 Procedure    US-guided bx of left supraclavicular LN     10/19/2018 Pathology Results    Accession:  [id number]  Lymph node, needle/core biopsy, left supraclavicular - METASTATIC CARCINOMA    11/09/2018 -  Chemotherapy    The patient had pegfilgrastim-cbqv (UDENYCA) injection 6 mg, 6 mg, Subcutaneous, Once, 1 of 1 cycle Administration: 6 mg (11/10/2018) trastuzumab (HERCEPTIN) 900 mg in sodium chloride 0.9 % 250 mL chemo infusion, 966 mg, Intravenous,  Once, 2 of 6 cycles Administration: 900 mg (11/09/2018), 600 mg (12/14/2018) DOCEtaxel (TAXOTERE) 170 mg in sodium chloride 0.9 % 250 mL chemo infusion, 75 mg/m2 = 170 mg, Intravenous,  Once, 2 of 6 cycles Dose modification: 60 mg/m2 (original dose 75 mg/m2, Cycle 2, Reason: Dose not tolerated) Administration: 170 mg (11/09/2018), 140 mg (12/14/2018) pertuzumab (PERJETA) 840 mg in sodium chloride 0.9 % 250 mL chemo infusion, 840 mg, Intravenous, Once, 2 of 6 cycles Administration: 840 mg (11/09/2018), 420 mg (12/14/2018)  for chemotherapy treatment.      REVIEW OF SYSTEMS:   Constitutional: ( - ) fevers, ( - )  chills , ( - ) night sweats Eyes: ( - ) blurriness of vision, ( - ) double vision, ( - ) watery eyes Ears, nose, mouth, throat, and face: ( - ) mucositis, ( - ) sore throat Respiratory: ( - ) cough, ( - ) dyspnea, ( - ) wheezes Cardiovascular: ( - ) palpitation, ( - ) chest discomfort, ( - ) lower extremity swelling Gastrointestinal:  ( - ) nausea, ( - ) heartburn, ( - ) change in bowel habits Skin: ( - ) abnormal skin rashes Lymphatics: ( - ) new lymphadenopathy, ( - ) easy bruising Neurological: ( - ) numbness, ( - ) tingling, ( - ) new weaknesses Behavioral/Psych: ( - ) mood change, ( - ) new changes  All other systems were reviewed with the patient and are negative.  I have reviewed the past medical history, past surgical history, social history and family history with the patient and they are unchanged from previous note.  ALLERGIES:  is allergic to aspirin.  MEDICATIONS:  Current Outpatient Medications  Medication Sig Dispense  Refill  . dexamethasone (DECADRON) 4 MG tablet Take 2 tablets (8 mg total) by mouth 2 (two) times daily. Start the day before Taxotere. Then daily after chemo for 2 days. 30 tablet 4  . gabapentin (NEURONTIN) 300 MG capsule Take 2 capsules (600 mg total) by mouth 2 (two) times daily. 120 capsule 5  . lidocaine-prilocaine (EMLA) cream Apply to affected area once 30 g 3  . LORazepam (ATIVAN) 0.5 MG tablet Take 1 tablet (0.5 mg total) by mouth every 6 (six) hours as needed (Nausea or vomiting). 30 tablet 0  . morphine (MS CONTIN) 30 MG 12 hr tablet Take 1 tablet (30 mg total) by mouth every 12 (twelve) hours for 30 days. 60 tablet 0  . ondansetron (ZOFRAN) 8 MG tablet Take 1 tablet (8 mg total) by mouth 2 (two) times daily as needed for refractory nausea / vomiting. 30 tablet 4  . prochlorperazine (COMPAZINE) 10 MG tablet Take 1 tablet (10 mg  total) by mouth every 6 (six) hours as needed (Nausea or vomiting). 30 tablet 4   No current facility-administered medications for this visit.     PHYSICAL EXAMINATION: ECOG PERFORMANCE STATUS: 2 - Symptomatic, <50% confined to bed  Today's Vitals   12/26/18 1028  BP: 108/75  Pulse: 88  Resp: 18  Temp: 98.1 F (36.7 C)  TempSrc: Oral  SpO2: 100%  Weight: 234 lb (106.1 kg)  Height: 5' 4"  (1.626 m)  PainSc: 0-No pain   Body mass index is 40.17 kg/m.  Filed Weights   12/26/18 1028  Weight: 234 lb (106.1 kg)    GENERAL: alert, no distress, sitting in a wheelchair  SKIN: skin color, texture, turgor are normal, no rashes or significant lesions EYES: conjunctiva are pink and non-injected, sclera clear OROPHARYNX: no exudate, no erythema; lips, buccal mucosa, and tongue normal  NECK: supple, non-tender LUNGS: modestly increased respiratory effort, especially with speaking long sentences  HEART: regular rate & rhythm and no murmurs and 1+ bilateral lower extremity edema ABDOMEN: soft, non-tender, non-distended, normal bowel sounds Musculoskeletal:  no cyanosis of digits and no clubbing  PSYCH: alert & oriented x 3  LABORATORY DATA:  I have reviewed the data as listed    Component Value Date/Time   NA 137 12/26/2018 1000   K 3.3 (L) 12/26/2018 1000   CL 98 12/26/2018 1000   CO2 32 12/26/2018 1000   GLUCOSE 124 (H) 12/26/2018 1000   BUN 5 (L) 12/26/2018 1000   CREATININE 0.60 12/26/2018 1000   CALCIUM 8.7 (L) 12/26/2018 1000   PROT 6.5 12/26/2018 1000   ALBUMIN 3.1 (L) 12/26/2018 1000   AST 19 12/26/2018 1000   ALT 16 12/26/2018 1000   ALKPHOS 100 12/26/2018 1000   BILITOT 0.6 12/26/2018 1000   GFRNONAA >60 12/26/2018 1000   GFRAA >60 12/26/2018 1000    No results found for: SPEP, UPEP  Lab Results  Component Value Date   WBC 9.3 12/26/2018   NEUTROABS PENDING 12/26/2018   HGB 7.4 (L) 12/26/2018   HCT 24.8 (L) 12/26/2018   MCV 87.0 12/26/2018   PLT 455 (H) 12/26/2018      Chemistry      Component Value Date/Time   NA 137 12/26/2018 1000   K 3.3 (L) 12/26/2018 1000   CL 98 12/26/2018 1000   CO2 32 12/26/2018 1000   BUN 5 (L) 12/26/2018 1000   CREATININE 0.60 12/26/2018 1000      Component Value Date/Time   CALCIUM 8.7 (L) 12/26/2018 1000   ALKPHOS 100 12/26/2018 1000   AST 19 12/26/2018 1000   ALT 16 12/26/2018 1000   BILITOT 0.6 12/26/2018 1000       RADIOGRAPHIC STUDIES: I have personally reviewed the radiological images as listed below and agreed with the findings in the report. Dg Chest 2 View  Result Date: 11/30/2018 CLINICAL DATA:  Increasing shortness of breath. History of metastatic breast cancer. EXAM: CHEST - 2 VIEW COMPARISON:  Chest x-ray dated November 01, 2018. FINDINGS: Unchanged right chest wall port catheter. PleurX catheter in the left lung base. The heart size and mediastinal contours are within normal limits. Normal pulmonary vascularity. New moderate left pleural effusion. Multiple bilateral pulmonary nodules are grossly unchanged. No pneumothorax. No acute osseous abnormality.  IMPRESSION: 1. Recurrent moderate left pleural effusion. 2. Grossly unchanged pulmonary metastatic disease. Electronically Signed   By: Titus Dubin M.D.   On: 11/30/2018 11:55   Ct Angio Chest Pe W Or Wo Contrast  Result Date: 12/07/2018 CLINICAL DATA:  Shortness of breath. History of metastatic breast cancer. EXAM: CT ANGIOGRAPHY CHEST WITH CONTRAST TECHNIQUE: Multidetector CT imaging of the chest was performed using the standard protocol during bolus administration of intravenous contrast. Multiplanar CT image reconstructions and MIPs were obtained to evaluate the vascular anatomy. CONTRAST:  120m OMNIPAQUE IOHEXOL 350 MG/ML SOLN COMPARISON:  CT scan of October 17, 2018. FINDINGS: Cardiovascular: There is no definite evidence of large central pulmonary embolus, although evaluation of the segmental level is significantly limited due to body habitus and motion artifact. Mild cardiomegaly is noted. No pericardial effusion is noted. Thoracic aorta is unremarkable. Mediastinum/Nodes: Thyroid gland and esophagus are unremarkable. Stable mediastinal adenopathy is noted consistent with metastatic disease. Stable bilateral axillary adenopathy is also noted, left greater than right, consistent with metastatic disease. Lungs/Pleura: No pneumothorax is noted. Bilateral pulmonary metastatic lesions are noted. Multiple loculated pleural effusions are noted in the left hemithorax, particularly within the left major fissure. There is been interval placement of chest tube into the left lung base. Left lower lobe opacity is noted concerning for pneumonia or atelectasis. Upper Abdomen: No acute abnormality. Musculoskeletal: There is interval development of multiple sclerotic densities within the thoracic spine concerning for possible metastatic disease. Large left breast mass with surrounding edema is again noted. Review of the MIP images confirms the above findings. IMPRESSION: No definite evidence of large central pulmonary  embolus, although evaluation of the segmental level is significantly limited due to body habitus and motion artifact. Smaller peripheral pulmonary emboli cannot be excluded on the basis of this exam. Bilateral pulmonary metastatic lesions are again noted. Multiple loculated left pleural effusions are noted in the left hemithorax, with interval placement of chest tube into the left lung base. Left lower lobe pneumonia or atelectasis is noted. Stable mediastinal adenopathy is noted consistent with metastatic disease. Interval development of multiple sclerotic densities are noted within the thoracic spine concerning for metastatic disease. Stable presence of large left breast mass is noted with surrounding edema, consistent with malignancy. Electronically Signed   By: JMarijo ConceptionM.D.   On: 12/07/2018 11:33   Ir Sinus/fist Tube Chk-non Gi  Result Date: 12/06/2018 INDICATION: 43year old female with a history of stage IV inflammatory left breast cancer with pulmonary metastases and malignant left pleural effusion. She had a tunneled pleural drainage catheter placed on 11/01/2018. Initially, drainage was good, however her drainage catheter has been putting out minimal fluid over the last several days. She feels short of breath and tight in her left chest raising concern that she has recurrent undrained pleural fluid. She presents today for evaluation and injection of the existing pleural drainage catheter as well as ultrasound evaluation of the chest with possible thoracentesis. EXAM: IR PERC PLEURAL DRAIN W/INDWELL CATH W/IMG GUIDE MEDICATIONS: None ANESTHESIA/SEDATION: None COMPLICATIONS: None immediate. PROCEDURE: Informed written consent was obtained from the patient after a thorough discussion of the procedural risks, benefits and alternatives. All questions were addressed. Maximal Sterile Barrier Technique was utilized including caps, mask, sterile gowns, sterile gloves, sterile drape, hand hygiene and skin  antiseptic. A timeout was performed prior to the initiation of the procedure. The drainage catheter was evaluated under fluoroscopy. The drain appears to be in the expected location in the left lung base. The drainage catheter flushes easily with saline. A drain injection was then performed using digital subtraction angiography. No evidence of catheter leak or obstruction. The injected contrast material opacifies a small collapsed compartment surrounding the drainage catheter. Ultrasound  was then used to interrogate the left pleural space. There is no evidence of significant recurrent pleural effusion in the dependent portion of the left pleural space. IMPRESSION: 1. Drain injection reveals a patent and normally functioning left-sided tunneled pleural drainage catheter. The pleural effusion appears largely resolved. 2. No evidence of recurrent large volume pleural effusion by ultrasound. Consider further evaluation with CT scan of the chest. Electronically Signed   By: Jacqulynn Cadet M.D.   On: 12/06/2018 15:40   Ir US Chest  Result Date: 12/06/2018 CLINICAL DATA:  43 year old female with malignant left pleural effusion. Evaluate for recurrence EXAM: CHEST ULTRASOUND COMPARISON:  Prior thoracentesis 10/27/2018 FINDINGS: The left chest was interrogated with ultrasound. There is no significant pleural fluid visible. IMPRESSION: No evidence of recurrent left-sided pleural effusion. Exam slightly limited by patient body habitus. Electronically Signed   By: Jacqulynn Cadet M.D.   On: 12/06/2018 15:53

## 2018-12-26 NOTE — Patient Instructions (Signed)

## 2018-12-27 ENCOUNTER — Encounter: Payer: Self-pay | Admitting: *Deleted

## 2018-12-27 ENCOUNTER — Telehealth: Payer: Self-pay | Admitting: Radiation Therapy

## 2018-12-27 ENCOUNTER — Inpatient Hospital Stay: Payer: BLUE CROSS/BLUE SHIELD

## 2018-12-27 DIAGNOSIS — R7989 Other specified abnormal findings of blood chemistry: Secondary | ICD-10-CM | POA: Diagnosis not present

## 2018-12-27 DIAGNOSIS — Z171 Estrogen receptor negative status [ER-]: Secondary | ICD-10-CM

## 2018-12-27 DIAGNOSIS — Z79899 Other long term (current) drug therapy: Secondary | ICD-10-CM | POA: Diagnosis not present

## 2018-12-27 DIAGNOSIS — G893 Neoplasm related pain (acute) (chronic): Secondary | ICD-10-CM | POA: Diagnosis not present

## 2018-12-27 DIAGNOSIS — C7931 Secondary malignant neoplasm of brain: Secondary | ICD-10-CM | POA: Diagnosis not present

## 2018-12-27 DIAGNOSIS — D72829 Elevated white blood cell count, unspecified: Secondary | ICD-10-CM | POA: Diagnosis not present

## 2018-12-27 DIAGNOSIS — C7800 Secondary malignant neoplasm of unspecified lung: Secondary | ICD-10-CM | POA: Diagnosis not present

## 2018-12-27 DIAGNOSIS — C50812 Malignant neoplasm of overlapping sites of left female breast: Secondary | ICD-10-CM | POA: Diagnosis not present

## 2018-12-27 DIAGNOSIS — E876 Hypokalemia: Secondary | ICD-10-CM | POA: Diagnosis not present

## 2018-12-27 DIAGNOSIS — Z5111 Encounter for antineoplastic chemotherapy: Secondary | ICD-10-CM | POA: Diagnosis not present

## 2018-12-27 DIAGNOSIS — J910 Malignant pleural effusion: Secondary | ICD-10-CM | POA: Diagnosis not present

## 2018-12-27 DIAGNOSIS — D649 Anemia, unspecified: Secondary | ICD-10-CM

## 2018-12-27 DIAGNOSIS — D638 Anemia in other chronic diseases classified elsewhere: Secondary | ICD-10-CM | POA: Diagnosis not present

## 2018-12-27 LAB — SAMPLE TO BLOOD BANK

## 2018-12-27 LAB — PREPARE RBC (CROSSMATCH)

## 2018-12-27 MED ORDER — DIPHENHYDRAMINE HCL 25 MG PO TABS
25.0000 mg | ORAL_TABLET | Freq: Once | ORAL | Status: AC
  Administered 2018-12-27: 09:00:00 25 mg via ORAL
  Filled 2018-12-27: qty 1

## 2018-12-27 MED ORDER — HEPARIN SOD (PORK) LOCK FLUSH 100 UNIT/ML IV SOLN
250.0000 [IU] | INTRAVENOUS | Status: AC | PRN
  Administered 2018-12-27: 12:00:00 250 [IU]
  Filled 2018-12-27: qty 5

## 2018-12-27 MED ORDER — ACETAMINOPHEN 325 MG PO TABS
ORAL_TABLET | ORAL | Status: AC
  Filled 2018-12-27: qty 2

## 2018-12-27 MED ORDER — ACETAMINOPHEN 325 MG PO TABS
650.0000 mg | ORAL_TABLET | Freq: Once | ORAL | Status: AC
  Administered 2018-12-27: 09:00:00 650 mg via ORAL

## 2018-12-27 MED ORDER — SODIUM CHLORIDE 0.9% IV SOLUTION
250.0000 mL | Freq: Once | INTRAVENOUS | Status: AC
  Administered 2018-12-27: 09:00:00 250 mL via INTRAVENOUS
  Filled 2018-12-27: qty 250

## 2018-12-27 MED ORDER — SODIUM CHLORIDE 0.9% FLUSH
10.0000 mL | INTRAVENOUS | Status: AC | PRN
  Administered 2018-12-27: 12:00:00 10 mL
  Filled 2018-12-27: qty 10

## 2018-12-27 MED ORDER — FUROSEMIDE 10 MG/ML IJ SOLN
20.0000 mg | Freq: Once | INTRAMUSCULAR | Status: AC
  Administered 2018-12-27: 20 mg via INTRAVENOUS

## 2018-12-27 MED ORDER — DIPHENHYDRAMINE HCL 25 MG PO CAPS
ORAL_CAPSULE | ORAL | Status: AC
  Filled 2018-12-27: qty 1

## 2018-12-27 MED ORDER — FUROSEMIDE 10 MG/ML IJ SOLN
INTRAMUSCULAR | Status: AC
  Filled 2018-12-27: qty 4

## 2018-12-27 NOTE — Telephone Encounter (Signed)
Spoke with Sharon Floyd about her Brain MRI and follow-up with [facility] in July. She has the information down and plans to attend.   Mont Dutton R.T.(R)(T) Special Procedures Navigator

## 2018-12-27 NOTE — Progress Notes (Signed)
Patiet requested lasix. OK to give, per Scherrie Bateman, NP.

## 2018-12-27 NOTE — Patient Instructions (Signed)
Blood Transfusion, Adult, Care After This sheet gives you information about how to care for yourself after your procedure. Your doctor may also give you more specific instructions. If you have problems or questions, contact your doctor. Follow these instructions at home:   Take over-the-counter and prescription medicines only as told by your doctor.  Go back to your normal activities as told by your doctor.  Follow instructions from your doctor about how to take care of the area where an IV tube was put into your vein (insertion site). Make sure you: ? Wash your hands with soap and water before you change your bandage (dressing). If there is no soap and water, use hand sanitizer. ? Change your bandage as told by your doctor.  Check your IV insertion site every day for signs of infection. Check for: ? More redness, swelling, or pain. ? More fluid or blood. ? Warmth. ? Pus or a bad smell. Contact a doctor if:  You have more redness, swelling, or pain around the IV insertion site.  You have more fluid or blood coming from the IV insertion site.  Your IV insertion site feels warm to the touch.  You have pus or a bad smell coming from the IV insertion site.  Your pee (urine) turns pink, red, or brown.  You feel weak after doing your normal activities. Get help right away if:  You have signs of a serious allergic or body defense (immune) system reaction, including: ? Itchiness. ? Hives. ? Trouble breathing. ? Anxiety. ? Pain in your chest or lower back. ? Fever, flushing, and chills. ? Fast pulse. ? Rash. ? Watery poop (diarrhea). ? Throwing up (vomiting). ? Dark pee. ? Serious headache. ? Dizziness. ? Stiff neck. ? Yellow color in your face or the white parts of your eyes (jaundice). Summary  After a blood transfusion, return to your normal activities as told by your doctor.  Every day, check for signs of infection where the IV tube was put into your vein.  Some  signs of infection are warm skin, more redness and pain, more fluid or blood, and pus or a bad smell where the needle went in.  Contact your doctor if you feel weak or have any unusual symptoms. This information is not intended to replace advice given to you by your health care provider. Make sure you discuss any questions you have with your health care provider. Document Released: 08/16/2014 Document Revised: 03/19/2016 Document Reviewed: 03/19/2016 Elsevier Interactive Patient Education  2019 [employer]  

## 2018-12-28 LAB — BPAM RBC
Blood Product Expiration Date: 202006082359
ISSUE DATE / TIME: 202005200748
Unit Type and Rh: 6200

## 2018-12-28 LAB — TYPE AND SCREEN
ABO/RH(D): A POS
Antibody Screen: NEGATIVE
Unit division: 0

## 2018-12-28 NOTE — Progress Notes (Signed)
[place] OFFICE PROGRESS NOTE  Patient Care Team: Rakes, Connye Burkitt, FNP as PCP - General (Family Medicine) Tish Men, MD as Medical Oncologist (Hematology) Cordelia Poche, RN as Oncology Nurse Navigator  HEME/ONC OVERVIEW: 1. 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; ER/PR-, HER2+ -Late 09/2018: evaluation for cough at [facility] ER; dermal thickening and multiple masses within the left breast and a 6cm mass invading the left pectoralis muscle on CT, consistent with primary breast malignancy, extensive thoracic adenopathy and numerous pulmonary metastases, and an indeterminate subcentimeter lucency in the dome of right liver -10/2018: admitted for malignant left pleural effusion (cytology pos for malignancy); MRI brain showed a single 62m R frontal met; CT AP negative; left supraclavicular LN bx showed metastatic carcinoma, ER/PR-, HER2+.   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 Port in 10/2018   3. Pleur-X in late 10/2018   TREATMENT REGIMEN:  10/23/2018 - 11/07/2018: palliative RT to the left breast x 2 weeks  11/01/2018: PleurX catheter for recurrent malignant left pleural effusion   11/08/2018: SBRT to the brain lesion, 20 Gy/1 fraction  11/09/2018 - present: Taxotere, Herceptin and Perjeta (Onpro on hold)  Taxotere dose reduced to 666mm2 due to anemia  ASSESSMENT & PLAN:   Stage IV ([id number] left breast cancer with mets to the brain and lungs  -S/p 2 cycles of palliative THP  -Labs adequate today, proceed with Cycle 3 of chemotherapy -[id number] monitoring while on Herceptin, next in early early 02/2019 -PRN anti-emetics: Zofran, Compazine, Ativan and dexamethasone -PRN anti-diarrheals:  Imodium  Metastasis to the brain  -S/p SBRT to the brain met on 11/08/2018 -Patient currently denies any new focal neurologic deficit -Follow-up MRI brain in early 02/2019, arranged by CNS tumor board  Chemotherapy-associated anemia -Secondary to chemotherapy -Patient received 1 unit RBC after the 2nd cycle of chemotherapy  -Hgb 8.5, slightly improving -Patient denies any symptom of bleeding -We will recheck her CBC in early 01/2019 after Cycle 3 of chemotherapy to assess the need for supportive transfusion -If she requires frequent RBC transfusion, we may consider Aranesp to reduce the transfusion load   Leukocytosis -Etiology unclear; possibly reactive -WBC 15.3k today, fluctuating  -Patient reports some chronic, intermittent cough with new scant yellow sputum production -I have ordered CXR today -If any consolidation, we can prescribe a course of levofloxacin  Thrombocytosis -Likely reactive in the setting of underlying malignancy -Plts 779k today, rising -We will monitor it for now   Cough -Possibly secondary to the pleural effusion vs. PNA -CXR as above -Low threshold to prescribe abx   Malignant left pleural effusion  -S/p Pleurx catheter placement in late 10/2018 -Overall, patient denies any worsening of exertional dyspnea -Continue weekly catheter drainage; if patient develops worsening dyspnea, will reach out to IR for further management  Breast wound -Currently followed by wound care center -Overall improving -Continue follow-up with wound care center  Cancer-related pain -Secondary to the breast malignancy -Currently on MS-Contin 98m38mD with oxycodone 10mg22mrs PRN for breakthrough  -Pain relatively well controlled -Continue current regimen  Orders Placed This Encounter  Procedures  . DG Chest 2 View    Standing Status:   Future    Standing Expiration Date:   01/04/2020    Order Specific Question:  Reason for Exam (SYMPTOM  OR DIAGNOSIS REQUIRED)     Answer:   Cough, yellow sputum production    Order Specific Question:   Preferred imaging location?    Answer:   [facility] Specific Question:   Radiology Contrast Protocol - do NOT remove file path    Answer:   [email or web address]    Order Specific Question:   Is patient pregnant?    Answer:   No   All questions were answered. The patient knows to call the clinic with any problems, questions or concerns. No barriers to learning was detected.  Return on 01/16/2019 for labs, port flush, and clinic appt to assess treatment toxicities and need for supportive transfusion.   Tish Men, MD 01/04/2019 9:56 AM  CHIEF COMPLAINT: "I am doing okay"  INTERVAL HISTORY: Ms. Noller returns to clinic for follow-up of metastatic breast cancer.  Patient reports that her appetite has been good and she has been eating well.  However, he has lost about 10 pounds since last week visit.  She still has chronic, intermittent cough, with mostly white sputum production, but over the past few days, she has noticed scant yellow secretions as well.  Her highest temperature has been 99.  Her exertional shortness of breath is all the same.  She denies any chest pain, rhinorrhea, sore throat, chest pain, abdominal pain, nausea, vomiting, or diarrhea.  She takes MS Contin twice a day and oxycodone 1-2 times per day for the breast pain, which seemed to control the pain relatively well.  She denies any neuropathy in hands and feet.  Denies any other complaint today.  SUMMARY OF ONCOLOGIC HISTORY:   Malignant neoplasm of overlapping sites of breast ([place])   09/30/2018 Imaging    CT chest with contrast (at [facility]): Impressions: 1.  Dermal thickening of the multiple masses within the left breast as well as a invasive 6 cm mass of the left pectoralis major, compatible with primary breast cancer. 2.  Numerous pulmonary metastases.  Left axillary, retropectoral,  supraclavicular, and upper mediastinal metastatic lymphadenopathy.  Mildly enlarged right axillary and supraclavicular lymph nodes may be metastatic. 3.  Indeterminant subcentimeter lucency in the dome of the right liver. 4.  Small left pleural effusion.    10/10/2018 Initial Diagnosis    Breast cancer ([place])    10/17/2018 Imaging    CTA chest: IMPRESSION: 1. No evidence of lobar or more central pulmonary embolus. Nondiagnostic segmental assessment. 2. Moderate left pleural effusion, increased in size from the prior CT with increasing left lung atelectasis. 3. Unchanged appearance of multiple left breast masses including an invasive mass involving the pectoralis major. 4. Unchanged left axillary, left subpectoral, and mediastinal lymphadenopathy. 5. Mild interval enlargement of multiple lung metastases and of right axillary lymph nodes.    10/18/2018 Imaging    MRI brain w/o and w/ contrast: Single 4 mm focus of enhancement within right frontal white matter probably representing metastatic disease. Attention at follow-up is recommended.    10/19/2018 Imaging    CT abdomen/pelvis w/ contrast: IMPRESSION: 1. No CT evidence of metastatic disease involving the abdomen or pelvis. 2. Left pleural effusion associated atelectasis consolidation with numerous pulmonary masses and nodules on partially imaged. Skin thickening of the left breast. Findings are in keeping with advanced breast malignancy and as seen on recent CT of the chest, 10/17/2018.    10/19/2018 Procedure    US-guided bx of left supraclavicular LN     10/19/2018 Pathology  Results    Accession: [id number]  Lymph node, needle/core biopsy, left supraclavicular - METASTATIC CARCINOMA    11/09/2018 -  Chemotherapy    The patient had pegfilgrastim-cbqv [facility]) injection 6 mg, 6 mg, Subcutaneous, Once, 1 of 1 cycle Administration: 6 mg (11/10/2018) trastuzumab (HERCEPTIN) 900 mg in sodium chloride 0.9 % 250 mL chemo infusion,  966 mg, Intravenous,  Once, 2 of 6 cycles Administration: 900 mg (11/09/2018), 600 mg (12/14/2018) DOCEtaxel (TAXOTERE) 170 mg in sodium chloride 0.9 % 250 mL chemo infusion, 75 mg/m2 = 170 mg, Intravenous,  Once, 2 of 6 cycles Dose modification: 60 mg/m2 (original dose 75 mg/m2, Cycle 2, Reason: Dose not tolerated) Administration: 170 mg (11/09/2018), 140 mg (12/14/2018) pertuzumab (PERJETA) 840 mg in sodium chloride 0.9 % 250 mL chemo infusion, 840 mg, Intravenous, Once, 2 of 6 cycles Administration: 840 mg (11/09/2018), 420 mg (12/14/2018)  for chemotherapy treatment.      REVIEW OF SYSTEMS:   Constitutional: ( - ) fevers, ( - )  chills , ( - ) night sweats Eyes: ( - ) blurriness of vision, ( - ) double vision, ( - ) watery eyes Ears, nose, mouth, throat, and face: ( - ) mucositis, ( - ) sore throat Respiratory: ( + ) cough, ( + ) dyspnea, ( - ) wheezes Cardiovascular: ( - ) palpitation, ( - ) chest discomfort, ( - ) lower extremity swelling Gastrointestinal:  ( - ) nausea, ( - ) heartburn, ( - ) change in bowel habits Skin: ( - ) abnormal skin rashes Lymphatics: ( - ) new lymphadenopathy, ( - ) easy bruising Neurological: ( - ) numbness, ( - ) tingling, ( - ) new weaknesses Behavioral/Psych: ( - ) mood change, ( - ) new changes  All other systems were reviewed with the patient and are negative.  I have reviewed the past medical history, past surgical history, social history and family history with the patient and they are unchanged from previous note.  ALLERGIES:  is allergic to aspirin.  MEDICATIONS:  Current Outpatient Medications  Medication Sig Dispense Refill  . dexamethasone (DECADRON) 4 MG tablet Take 2 tablets (8 mg total) by mouth 2 (two) times daily. Start the day before Taxotere. Then daily after chemo for 2 days. 30 tablet 4  . gabapentin (NEURONTIN) 300 MG capsule Take 2 capsules (600 mg total) by mouth 2 (two) times daily. 120 capsule 5  . lidocaine-prilocaine (EMLA) cream  Apply to affected area once 30 g 3  . LORazepam (ATIVAN) 0.5 MG tablet Take 1 tablet (0.5 mg total) by mouth every 6 (six) hours as needed (Nausea or vomiting). 30 tablet 0  . morphine (MS CONTIN) 30 MG 12 hr tablet Take 1 tablet (30 mg total) by mouth every 12 (twelve) hours for 30 days. 60 tablet 0  . ondansetron (ZOFRAN) 8 MG tablet Take 1 tablet (8 mg total) by mouth 2 (two) times daily as needed for refractory nausea / vomiting. 30 tablet 4  . prochlorperazine (COMPAZINE) 10 MG tablet Take 1 tablet (10 mg total) by mouth every 6 (six) hours as needed (Nausea or vomiting). 30 tablet 4  . oxyCODONE (OXY IR/ROXICODONE) 5 MG immediate release tablet Take 2 tablets (10 mg total) by mouth every 6 (six) hours as needed for up to 30 days for severe pain. 90 tablet 0   No current facility-administered medications for this visit.     PHYSICAL EXAMINATION: ECOG PERFORMANCE STATUS: 2 - Symptomatic, <50% confined to bed  Today's  Vitals   01/04/19 0915 01/04/19 0932  BP: 116/75   Pulse: 95   Resp: (!) 21   Temp: 97.6 F (36.4 C)   TempSrc: Oral   SpO2: 100%   Weight: 223 lb 12.8 oz (101.5 kg)   Height: 5' 4"  (1.626 m)   PainSc:  2    Body mass index is 38.42 kg/m.  Filed Weights   01/04/19 0915  Weight: 223 lb 12.8 oz (101.5 kg)    GENERAL: alert, no distress and comfortable SKIN: skin color, texture, turgor are normal, no rashes or significant lesions EYES: conjunctiva are pink and non-injected, sclera clear OROPHARYNX: no exudate, no erythema; lips, buccal mucosa, and tongue normal  NECK: supple, non-tender BREAST: right breast modestly improving in size and skin is not as tight, some pitting edema, two superficial wounds healing well without any sign of infection  LUNGS: slightly labored breathing when speaking in long sentences  HEART: regular rate & rhythm and no murmurs ABDOMEN: soft, non-tender, non-distended, normal bowel sounds Musculoskeletal: no cyanosis of digits and no  clubbing  PSYCH: alert & oriented x 3, fluent speech  LABORATORY DATA:  I have reviewed the data as listed    Component Value Date/Time   NA 137 01/04/2019 0910   K 5.1 01/04/2019 0910   CL 100 01/04/2019 0910   CO2 30 01/04/2019 0910   GLUCOSE 134 (H) 01/04/2019 0910   BUN 10 01/04/2019 0910   CREATININE 0.61 01/04/2019 0910   CALCIUM 9.7 01/04/2019 0910   PROT 7.6 01/04/2019 0910   ALBUMIN 3.3 (L) 01/04/2019 0910   AST 23 01/04/2019 0910   ALT 22 01/04/2019 0910   ALKPHOS 113 01/04/2019 0910   BILITOT 0.4 01/04/2019 0910   GFRNONAA >60 01/04/2019 0910   GFRAA >60 01/04/2019 0910    No results found for: SPEP, UPEP  Lab Results  Component Value Date   WBC 15.3 (H) 01/04/2019   NEUTROABS 13.9 (H) 01/04/2019   HGB 8.5 (L) 01/04/2019   HCT 28.6 (L) 01/04/2019   MCV 86.9 01/04/2019   PLT 779 (H) 01/04/2019      Chemistry      Component Value Date/Time   NA 137 01/04/2019 0910   K 5.1 01/04/2019 0910   CL 100 01/04/2019 0910   CO2 30 01/04/2019 0910   BUN 10 01/04/2019 0910   CREATININE 0.61 01/04/2019 0910      Component Value Date/Time   CALCIUM 9.7 01/04/2019 0910   ALKPHOS 113 01/04/2019 0910   AST 23 01/04/2019 0910   ALT 22 01/04/2019 0910   BILITOT 0.4 01/04/2019 0910       RADIOGRAPHIC STUDIES: I have personally reviewed the radiological images as listed below and agreed with the findings in the report. Ct Angio Chest Pe W Or Wo Contrast  Result Date: 12/07/2018 CLINICAL DATA:  Shortness of breath. History of metastatic breast cancer. EXAM: CT ANGIOGRAPHY CHEST WITH CONTRAST TECHNIQUE: Multidetector CT imaging of the chest was performed using the standard protocol during bolus administration of intravenous contrast. Multiplanar CT image reconstructions and MIPs were obtained to evaluate the vascular anatomy. CONTRAST:  112m OMNIPAQUE IOHEXOL 350 MG/ML SOLN COMPARISON:  CT scan of October 17, 2018. FINDINGS: Cardiovascular: There is no definite evidence  of large central pulmonary embolus, although evaluation of the segmental level is significantly limited due to body habitus and motion artifact. Mild cardiomegaly is noted. No pericardial effusion is noted. Thoracic aorta is unremarkable. Mediastinum/Nodes: Thyroid gland and esophagus are unremarkable. Stable  mediastinal adenopathy is noted consistent with metastatic disease. Stable bilateral axillary adenopathy is also noted, left greater than right, consistent with metastatic disease. Lungs/Pleura: No pneumothorax is noted. Bilateral pulmonary metastatic lesions are noted. Multiple loculated pleural effusions are noted in the left hemithorax, particularly within the left major fissure. There is been interval placement of chest tube into the left lung base. Left lower lobe opacity is noted concerning for pneumonia or atelectasis. Upper Abdomen: No acute abnormality. Musculoskeletal: There is interval development of multiple sclerotic densities within the thoracic spine concerning for possible metastatic disease. Large left breast mass with surrounding edema is again noted. Review of the MIP images confirms the above findings. IMPRESSION: No definite evidence of large central pulmonary embolus, although evaluation of the segmental level is significantly limited due to body habitus and motion artifact. Smaller peripheral pulmonary emboli cannot be excluded on the basis of this exam. Bilateral pulmonary metastatic lesions are again noted. Multiple loculated left pleural effusions are noted in the left hemithorax, with interval placement of chest tube into the left lung base. Left lower lobe pneumonia or atelectasis is noted. Stable mediastinal adenopathy is noted consistent with metastatic disease. Interval development of multiple sclerotic densities are noted within the thoracic spine concerning for metastatic disease. Stable presence of large left breast mass is noted with surrounding edema, consistent with  malignancy. Electronically Signed   By: Marijo Conception M.D.   On: 12/07/2018 11:33   Ir Sinus/fist Tube Chk-non Gi  Result Date: 12/06/2018 INDICATION: 43 year old female with a history of stage IV inflammatory left breast cancer with pulmonary metastases and malignant left pleural effusion. She had a tunneled pleural drainage catheter placed on 11/01/2018. Initially, drainage was good, however her drainage catheter has been putting out minimal fluid over the last several days. She feels short of breath and tight in her left chest raising concern that she has recurrent undrained pleural fluid. She presents today for evaluation and injection of the existing pleural drainage catheter as well as ultrasound evaluation of the chest with possible thoracentesis. EXAM: IR PERC PLEURAL DRAIN W/INDWELL CATH W/IMG GUIDE MEDICATIONS: None ANESTHESIA/SEDATION: None COMPLICATIONS: None immediate. PROCEDURE: Informed written consent was obtained from the patient after a thorough discussion of the procedural risks, benefits and alternatives. All questions were addressed. Maximal Sterile Barrier Technique was utilized including caps, mask, sterile gowns, sterile gloves, sterile drape, hand hygiene and skin antiseptic. A timeout was performed prior to the initiation of the procedure. The drainage catheter was evaluated under fluoroscopy. The drain appears to be in the expected location in the left lung base. The drainage catheter flushes easily with saline. A drain injection was then performed using digital subtraction angiography. No evidence of catheter leak or obstruction. The injected contrast material opacifies a small collapsed compartment surrounding the drainage catheter. Ultrasound was then used to interrogate the left pleural space. There is no evidence of significant recurrent pleural effusion in the dependent portion of the left pleural space. IMPRESSION: 1. Drain injection reveals a patent and normally functioning  left-sided tunneled pleural drainage catheter. The pleural effusion appears largely resolved. 2. No evidence of recurrent large volume pleural effusion by ultrasound. Consider further evaluation with CT scan of the chest. Electronically Signed   By: Jacqulynn Cadet M.D.   On: 12/06/2018 15:40   Ir US Chest  Result Date: 12/06/2018 CLINICAL DATA:  43 year old female with malignant left pleural effusion. Evaluate for recurrence EXAM: CHEST ULTRASOUND COMPARISON:  Prior thoracentesis 10/27/2018 FINDINGS: The left chest was  interrogated with ultrasound. There is no significant pleural fluid visible. IMPRESSION: No evidence of recurrent left-sided pleural effusion. Exam slightly limited by patient body habitus. Electronically Signed   By: Jacqulynn Cadet M.D.   On: 12/06/2018 15:53

## 2019-01-04 ENCOUNTER — Inpatient Hospital Stay (HOSPITAL_BASED_OUTPATIENT_CLINIC_OR_DEPARTMENT_OTHER): Payer: BLUE CROSS/BLUE SHIELD | Admitting: Hematology

## 2019-01-04 ENCOUNTER — Encounter: Payer: Self-pay | Admitting: Hematology

## 2019-01-04 ENCOUNTER — Other Ambulatory Visit: Payer: Self-pay

## 2019-01-04 ENCOUNTER — Telehealth: Payer: Self-pay | Admitting: *Deleted

## 2019-01-04 ENCOUNTER — Inpatient Hospital Stay: Payer: BLUE CROSS/BLUE SHIELD

## 2019-01-04 ENCOUNTER — Other Ambulatory Visit: Payer: Self-pay | Admitting: Hematology

## 2019-01-04 ENCOUNTER — Ambulatory Visit (HOSPITAL_BASED_OUTPATIENT_CLINIC_OR_DEPARTMENT_OTHER)
Admission: RE | Admit: 2019-01-04 | Discharge: 2019-01-04 | Disposition: A | Discharge status: Home / Self Care | Payer: BLUE CROSS/BLUE SHIELD | Source: Ambulatory Visit | Attending: Hematology | Admitting: Hematology

## 2019-01-04 ENCOUNTER — Telehealth: Payer: Self-pay | Admitting: Hematology

## 2019-01-04 VITALS — BP 116/75 | HR 95 | Temp 97.6°F | Resp 21 | Ht 64.0 in | Wt 223.8 lb

## 2019-01-04 DIAGNOSIS — C50812 Malignant neoplasm of overlapping sites of left female breast: Secondary | ICD-10-CM

## 2019-01-04 DIAGNOSIS — E876 Hypokalemia: Secondary | ICD-10-CM

## 2019-01-04 DIAGNOSIS — Z79899 Other long term (current) drug therapy: Secondary | ICD-10-CM

## 2019-01-04 DIAGNOSIS — R7989 Other specified abnormal findings of blood chemistry: Secondary | ICD-10-CM | POA: Diagnosis not present

## 2019-01-04 DIAGNOSIS — D638 Anemia in other chronic diseases classified elsewhere: Secondary | ICD-10-CM | POA: Diagnosis not present

## 2019-01-04 DIAGNOSIS — C7800 Secondary malignant neoplasm of unspecified lung: Secondary | ICD-10-CM | POA: Diagnosis not present

## 2019-01-04 DIAGNOSIS — G893 Neoplasm related pain (acute) (chronic): Secondary | ICD-10-CM | POA: Diagnosis not present

## 2019-01-04 DIAGNOSIS — R05 Cough: Secondary | ICD-10-CM | POA: Insufficient documentation

## 2019-01-04 DIAGNOSIS — R059 Cough, unspecified: Secondary | ICD-10-CM

## 2019-01-04 DIAGNOSIS — D75839 Thrombocytosis, unspecified: Secondary | ICD-10-CM

## 2019-01-04 DIAGNOSIS — D72829 Elevated white blood cell count, unspecified: Secondary | ICD-10-CM

## 2019-01-04 DIAGNOSIS — C7931 Secondary malignant neoplasm of brain: Secondary | ICD-10-CM | POA: Diagnosis not present

## 2019-01-04 DIAGNOSIS — Z171 Estrogen receptor negative status [ER-]: Secondary | ICD-10-CM

## 2019-01-04 DIAGNOSIS — Z5111 Encounter for antineoplastic chemotherapy: Secondary | ICD-10-CM | POA: Diagnosis not present

## 2019-01-04 DIAGNOSIS — D473 Essential (hemorrhagic) thrombocythemia: Secondary | ICD-10-CM

## 2019-01-04 DIAGNOSIS — J189 Pneumonia, unspecified organism: Secondary | ICD-10-CM

## 2019-01-04 DIAGNOSIS — J910 Malignant pleural effusion: Secondary | ICD-10-CM

## 2019-01-04 DIAGNOSIS — D649 Anemia, unspecified: Secondary | ICD-10-CM

## 2019-01-04 LAB — CBC WITH DIFFERENTIAL (CANCER CENTER ONLY)
Abs Immature Granulocytes: 0.44 10*3/uL — ABNORMAL HIGH (ref 0.00–0.07)
Basophils Absolute: 0 10*3/uL (ref 0.0–0.1)
Basophils Relative: 0 %
Eosinophils Absolute: 0 10*3/uL (ref 0.0–0.5)
Eosinophils Relative: 0 %
HCT: 28.6 % — ABNORMAL LOW (ref 36.0–46.0)
Hemoglobin: 8.5 g/dL — ABNORMAL LOW (ref 12.0–15.0)
Immature Granulocytes: 3 %
Lymphocytes Relative: 3 %
Lymphs Abs: 0.4 10*3/uL — ABNORMAL LOW (ref 0.7–4.0)
MCH: 25.8 pg — ABNORMAL LOW (ref 26.0–34.0)
MCHC: 29.7 g/dL — ABNORMAL LOW (ref 30.0–36.0)
MCV: 86.9 fL (ref 80.0–100.0)
Monocytes Absolute: 0.5 10*3/uL (ref 0.1–1.0)
Monocytes Relative: 3 %
Neutro Abs: 13.9 10*3/uL — ABNORMAL HIGH (ref 1.7–7.7)
Neutrophils Relative %: 91 %
Platelet Count: 779 10*3/uL — ABNORMAL HIGH (ref 150–400)
RBC: 3.29 MIL/uL — ABNORMAL LOW (ref 3.87–5.11)
RDW: 17.9 % — ABNORMAL HIGH (ref 11.5–15.5)
WBC Count: 15.3 10*3/uL — ABNORMAL HIGH (ref 4.0–10.5)
nRBC: 0 % (ref 0.0–0.2)

## 2019-01-04 LAB — CMP (CANCER CENTER ONLY)
ALT: 22 U/L (ref 0–44)
AST: 23 U/L (ref 15–41)
Albumin: 3.3 g/dL — ABNORMAL LOW (ref 3.5–5.0)
Alkaline Phosphatase: 113 U/L (ref 38–126)
Anion gap: 7 (ref 5–15)
BUN: 10 mg/dL (ref 6–20)
CO2: 30 mmol/L (ref 22–32)
Calcium: 9.7 mg/dL (ref 8.9–10.3)
Chloride: 100 mmol/L (ref 98–111)
Creatinine: 0.61 mg/dL (ref 0.44–1.00)
GFR, Est AFR Am: >60 mL/min (ref >60)
GFR, Estimated: >60 mL/min (ref >60)
Glucose, Bld: 134 mg/dL — ABNORMAL HIGH (ref 70–99)
Potassium: 5.1 mmol/L (ref 3.5–5.1)
Sodium: 137 mmol/L (ref 135–145)
Total Bilirubin: 0.4 mg/dL (ref 0.3–1.2)
Total Protein: 7.6 g/dL (ref 6.5–8.1)

## 2019-01-04 LAB — SAMPLE TO BLOOD BANK

## 2019-01-04 LAB — MAGNESIUM: Magnesium: 2.5 mg/dL — ABNORMAL HIGH (ref 1.7–2.4)

## 2019-01-04 MED ORDER — SODIUM CHLORIDE 0.9 % IV SOLN
60.0000 mg/m2 | Freq: Once | INTRAVENOUS | Status: AC
  Administered 2019-01-04: 140 mg via INTRAVENOUS
  Filled 2019-01-04: qty 14

## 2019-01-04 MED ORDER — DEXAMETHASONE SODIUM PHOSPHATE 10 MG/ML IJ SOLN
10.0000 mg | Freq: Once | INTRAMUSCULAR | Status: AC
  Administered 2019-01-04: 10 mg via INTRAVENOUS

## 2019-01-04 MED ORDER — HEPARIN SOD (PORK) LOCK FLUSH 100 UNIT/ML IV SOLN
500.0000 [IU] | Freq: Once | INTRAVENOUS | Status: AC | PRN
  Administered 2019-01-04: 500 [IU]
  Filled 2019-01-04: qty 5

## 2019-01-04 MED ORDER — TRASTUZUMAB CHEMO 150 MG IV SOLR
600.0000 mg | Freq: Once | INTRAVENOUS | Status: AC
  Administered 2019-01-04: 600 mg via INTRAVENOUS
  Filled 2019-01-04: qty 28.57

## 2019-01-04 MED ORDER — DEXAMETHASONE SODIUM PHOSPHATE 10 MG/ML IJ SOLN
INTRAMUSCULAR | Status: AC
  Filled 2019-01-04: qty 1

## 2019-01-04 MED ORDER — SODIUM CHLORIDE 0.9 % IV SOLN
Freq: Once | INTRAVENOUS | Status: AC
  Administered 2019-01-04: 10:00:00 via INTRAVENOUS
  Filled 2019-01-04: qty 250

## 2019-01-04 MED ORDER — ACETAMINOPHEN 325 MG PO TABS
650.0000 mg | ORAL_TABLET | Freq: Once | ORAL | Status: AC
  Administered 2019-01-04: 650 mg via ORAL

## 2019-01-04 MED ORDER — OXYCODONE HCL 5 MG PO TABS: 10.0000 mg | ORAL_TABLET | Freq: Four times a day (QID) | ORAL | 0 refills | Status: DC | PRN

## 2019-01-04 MED ORDER — SODIUM CHLORIDE 0.9% FLUSH
10.0000 mL | INTRAVENOUS | Status: DC | PRN
  Administered 2019-01-04: 10 mL
  Filled 2019-01-04: qty 10

## 2019-01-04 MED ORDER — ACETAMINOPHEN 325 MG PO TABS
ORAL_TABLET | ORAL | Status: AC
  Filled 2019-01-04: qty 2

## 2019-01-04 MED ORDER — DIPHENHYDRAMINE HCL 25 MG PO CAPS
ORAL_CAPSULE | ORAL | Status: AC
  Filled 2019-01-04: qty 2

## 2019-01-04 MED ORDER — DIPHENHYDRAMINE HCL 25 MG PO CAPS
50.0000 mg | ORAL_CAPSULE | Freq: Once | ORAL | Status: AC
  Administered 2019-01-04: 50 mg via ORAL

## 2019-01-04 MED ORDER — SODIUM CHLORIDE 0.9 % IV SOLN
420.0000 mg | Freq: Once | INTRAVENOUS | Status: AC
  Administered 2019-01-04: 420 mg via INTRAVENOUS
  Filled 2019-01-04: qty 14

## 2019-01-04 MED ORDER — LEVOFLOXACIN 500 MG PO TABS: 500.0000 mg | ORAL_TABLET | Freq: Every day | ORAL | 0 refills | Status: AC

## 2019-01-04 NOTE — Progress Notes (Signed)
OK to treat with today's labs per Dr. Maylon Peppers

## 2019-01-04 NOTE — Telephone Encounter (Signed)
Notified pt of results, to p/u medication at Pharmacy and call or Go to ED if condition get worse.

## 2019-01-04 NOTE — Patient Instructions (Signed)

## 2019-01-04 NOTE — Telephone Encounter (Signed)
-----   Message from Tish Men, MD sent at 01/04/2019  3:24 PM EDT ----- Graceann Congress,  Can you let Ms. Girdler know that her CXR showed possible pneumonia, and I have sent a prescription for Levaquin to her pharmacy? If she develops any fever, worsening dyspnea, she should call us or go to the ER for further evaluation.  Thanks.  [place]  ----- Message ----- From: Interface, Rad Results In Sent: 01/04/2019   3:12 PM EDT To: Tish Men, MD

## 2019-01-04 NOTE — Progress Notes (Unsigned)
le

## 2019-01-04 NOTE — Telephone Encounter (Signed)
Appts scheduled per 5/28 los

## 2019-01-04 NOTE — Patient Instructions (Signed)
[facility] Discharge Instructions for Patients Receiving Chemotherapy  Today you received the following chemotherapy agents:  Herceptin, Perjeta and Taxotere  To help prevent nausea and vomiting after your treatment, we encourage you to take your nausea medication as ordered per MD.    If you develop nausea and vomiting that is not controlled by your nausea medication, call the clinic.   BELOW ARE SYMPTOMS THAT SHOULD BE REPORTED IMMEDIATELY:  *FEVER GREATER THAN 100.5 F  *CHILLS WITH OR WITHOUT FEVER  NAUSEA AND VOMITING THAT IS NOT CONTROLLED WITH YOUR NAUSEA MEDICATION  *UNUSUAL SHORTNESS OF BREATH  *UNUSUAL BRUISING OR BLEEDING  TENDERNESS IN MOUTH AND THROAT WITH OR WITHOUT PRESENCE OF ULCERS  *URINARY PROBLEMS  *BOWEL PROBLEMS  UNUSUAL RASH Items with * indicate a potential emergency and should be followed up as soon as possible.  Feel free to call the clinic should you have any questions or concerns. The clinic phone number is [phone].  Please show the [place] [facility] at check-in to the Emergency Department and triage nurse.

## 2019-01-08 ENCOUNTER — Other Ambulatory Visit: Payer: Self-pay | Admitting: Hematology

## 2019-01-08 DIAGNOSIS — C50812 Malignant neoplasm of overlapping sites of left female breast: Secondary | ICD-10-CM

## 2019-01-15 NOTE — Progress Notes (Signed)
[facility] OFF[facility] PROGRESS NOTE  Patient Care Team: Rakes, Connye Burkitt, FNP as PCP - General (Family Medicine) Tish Men, MD as Medical Oncologist (Hematology) Cordelia Poche, RN as Oncology Nurse Navigator  HEME/ONC OVERVIEW: 1. 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; ER/PR-, HER2+ -Late 09/2018: evaluation for cough at [facility] ER; dermal thickening and multiple masses within the left breast and a 6cm mass invading the left pectoralis muscle on CT, consistent with primary breast malignancy, extensive thoracic adenopathy and numerous pulmonary metastases, and an indeterminate subcentimeter lucency in the dome of right liver -10/2018: admitted for malignant left pleural effusion (cytology pos for malignancy); MRI brain showed a single 16m R frontal met; CT AP negative; left supraclavicular LN bx showed metastatic carcinoma, ER/PR-, HER2+.   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 Port in 10/2018   3. Pleur-X in late 10/2018   TREATMENT REGIMEN:  10/23/2018 - 11/07/2018: palliative RT to the left breast x 2 weeks  11/01/2018: PleurX catheter for recurrent malignant left pleural effusion   11/08/2018: SBRT to the brain lesion, 20 Gy/1 fraction  11/09/2018 - present: Taxotere, Herceptin and Perjeta (Onpro on hold)  Taxotere dose reduced to 652mm2 due to anemia  ASSESSMENT & PLAN:   Stage IV ([id number] left breast cancer with mets to the brain and lungs  -S/p 3 cycles of palliative THP in the 1st line treatment -Cycle 4 of chemotherapy scheduled on 01/25/2019 -We will plan to obtain CT CAP after the 4th cycle of treatment to assess interim response -[id number] monitoring while on Herceptin, next in early  02/2019 -PRN anti-emetics: Zofran, Compazine, Ativan and dexamethasone  Malignant left pleural effusion  -S/p Pleurx catheter placement in late 10/2018  -Overall, patient's exertional dyspnea remains stable -If dyspnea worsens, we can reach out to IR regarding further management, given recent CT showed some loculated left-sided pleural effusion  Metastasis to the brain  -S/p SBRT to the brain met on 11/08/2018 -Repeat MRI brain scheduled on 02/08/2019 -Patient denies any new focal neurologic deficits  -We will monitor it for now   Chemotherapy-associated anemia -Secondary to chemotherapy -Hgb 8.6, stable -Patient denies any symptom of bleeding -Taxotere dose reduced for anemia -Patient has required periodic RBC transfusion; if anemia persists, we can consider adding Aranesp with chemotherapy  -We will monitor for now  Breast wound -Overall improving, followed by wound care center   Cancer-related pain -Secondary to the breast malignancy and catheter placement -On MS-Contin 76m46mD w/ PRN oxycodone 10mg13mrs PRN for breakthrough pain -Pain relatively well controlled -Continue pain regimen above   No orders of the defined types were placed in this encounter.  All questions were answered. The patient knows to call the clinic with any problems, questions or concerns. No barriers to learning was detected.   Return on 01/25/2019 for labs, port flush, clinic appt and Cycle 4 of THP.   [facility] ZTish Men6/04/2019 3:29 PM  CHIEF COMPLAINT: "I am still short of breath"  INTERVAL HISTORY: [facility] for follow-up of metastatic breast cancer on THP.  Patient reports that she still has mild to moderate exertional shortness of breath, which has been overall stable.  She is able to help her family make breakfast in the morning, but  after that, she feels short of breath and has to rest.  She is on 2L O2 at rest and 3L with exertion.  She is currently draining her Pleurx catheter  once a week, and has been having relatively minimal output.  She reports stable, periodic swelling of the left breast, with some associated discomfort, but she takes MS-Contin BID and oxycodone qhs with adequate control of the pain.  She denies any other complaint today.  SUMMARY OF ONCOLOGIC HISTORY:   Malignant neoplasm of overlapping sites of breast ([place])   09/30/2018 Imaging    CT chest with contrast (at [facility]): Impressions: 1.  Dermal thickening of the multiple masses within the left breast as well as a invasive 6 cm mass of the left pectoralis major, compatible with primary breast cancer. 2.  Numerous pulmonary metastases.  Left axillary, retropectoral, supraclavicular, and upper mediastinal metastatic lymphadenopathy.  Mildly enlarged right axillary and supraclavicular lymph nodes may be metastatic. 3.  Indeterminant subcentimeter lucency in the dome of the right liver. 4.  Small left pleural effusion.    10/10/2018 Initial Diagnosis    Breast cancer ([place])    10/17/2018 Imaging    CTA chest: IMPRESSION: 1. No evidence of lobar or more central pulmonary embolus. Nondiagnostic segmental assessment. 2. Moderate left pleural effusion, increased in size from the prior CT with increasing left lung atelectasis. 3. Unchanged appearance of multiple left breast masses including an invasive mass involving the pectoralis major. 4. Unchanged left axillary, left subpectoral, and mediastinal lymphadenopathy. 5. Mild interval enlargement of multiple lung metastases and of right axillary lymph nodes.    10/18/2018 Imaging    MRI brain w/o and w/ contrast: Single 4 mm focus of enhancement within right frontal white matter probably representing metastatic disease. Attention at follow-up is recommended.    10/19/2018 Imaging    CT abdomen/pelvis w/ contrast: IMPRESSION: 1. No CT evidence of metastatic disease involving the abdomen or pelvis. 2. Left pleural effusion associated  atelectasis consolidation with numerous pulmonary masses and nodules on partially imaged. Skin thickening of the left breast. Findings are in keeping with advanced breast malignancy and as seen on recent CT of the chest, 10/17/2018.    10/19/2018 Procedure    US-guided bx of left supraclavicular LN     10/19/2018 Pathology Results    Accession: [id number]  Lymph node, needle/core biopsy, left supraclavicular - METASTATIC CARCINOMA    11/09/2018 -  Chemotherapy    The patient had pegfilgrastim-cbqv (UDENYCA) injection 6 mg, 6 mg, Subcutaneous, Once, 1 of 1 cycle Administration: 6 mg (11/10/2018) trastuzumab (HERCEPTIN) 900 mg in sodium chloride 0.9 % 250 mL chemo infusion, 966 mg, Intravenous,  Once, 3 of 6 cycles Administration: 900 mg (11/09/2018), 600 mg (12/14/2018), 600 mg (01/04/2019) DOCEtaxel (TAXOTERE) 170 mg in sodium chloride 0.9 % 250 mL chemo infusion, 75 mg/m2 = 170 mg, Intravenous,  Once, 3 of 6 cycles Dose modification: 60 mg/m2 (original dose 75 mg/m2, Cycle 2, Reason: Dose not tolerated) Administration: 170 mg (11/09/2018), 140 mg (12/14/2018), 140 mg (01/04/2019) pertuzumab (PERJETA) 840 mg in sodium chloride 0.9 % 250 mL chemo infusion, 840 mg, Intravenous, Once, 3 of 6 cycles Administration: 840 mg (11/09/2018), 420 mg (12/14/2018), 420 mg (01/04/2019)  for chemotherapy treatment.      REVIEW OF SYSTEMS:   Constitutional: ( - ) fevers, ( - )  chills , ( - ) night sweats Eyes: ( - ) blurriness of vision, ( - ) double vision, ( - ) watery eyes Ears,  nose, mouth, throat, and face: ( - ) mucositis, ( - ) sore throat Respiratory: ( - ) cough, ( + ) dyspnea, ( - ) wheezes Cardiovascular: ( - ) palpitation, ( - ) chest discomfort, ( - ) lower extremity swelling Gastrointestinal:  ( - ) nausea, ( - ) heartburn, ( - ) change in bowel habits Skin: ( - ) abnormal skin rashes Lymphatics: ( - ) new lymphadenopathy, ( - ) easy bruising Neurological: ( - ) numbness, ( - ) tingling, ( - ) new  weaknesses Behavioral/Psych: ( - ) mood change, ( - ) new changes  All other systems were reviewed with the patient and are negative.  I have reviewed the past medical history, past surgical history, social history and family history with the patient and they are unchanged from previous note.  ALLERGIES:  is allergic to aspirin.  MEDICATIONS:  Current Outpatient Medications  Medication Sig Dispense Refill  . dexamethasone (DECADRON) 4 MG tablet Take 2 tablets (8 mg total) by mouth 2 (two) times daily. Start the day before Taxotere. Then daily after chemo for 2 days. 30 tablet 4  . gabapentin (NEURONTIN) 300 MG capsule TAKE 2 CAPSULES (600 MG TOTAL) BY MOUTH 2 (TWO) TIMES DAILY. 360 capsule 2  . lidocaine-prilocaine (EMLA) cream Apply to affected area once 30 g 3  . LORazepam (ATIVAN) 0.5 MG tablet Take 1 tablet (0.5 mg total) by mouth every 6 (six) hours as needed (Nausea or vomiting). 30 tablet 0  . ondansetron (ZOFRAN) 8 MG tablet Take 1 tablet (8 mg total) by mouth 2 (two) times daily as needed for refractory nausea / vomiting. 30 tablet 4  . oxyCODONE (OXY IR/ROXICODONE) 5 MG immediate release tablet Take 2 tablets (10 mg total) by mouth every 6 (six) hours as needed for up to 30 days for severe pain. 90 tablet 0  . prochlorperazine (COMPAZINE) 10 MG tablet Take 1 tablet (10 mg total) by mouth every 6 (six) hours as needed (Nausea or vomiting). 30 tablet 4   No current facility-administered medications for this visit.     PHYSICAL EXAMINATION: ECOG PERFORMANCE STATUS: 2 - Symptomatic, <50% confined to bed  Today's Vitals   01/16/19 1413  BP: 130/82  Pulse: (!) 111  Resp: 18  Temp: 99.5 F (37.5 C)  TempSrc: Oral  SpO2: 100%  Weight: 223 lb (101.2 kg)  Height: _0  (1.626 m)  PainSc: 0-No pain   Body mass index is 38.28 kg/m.  Filed Weights   01/16/19 1413  Weight: 223 lb (101.2 kg)    GENERAL: alert, no distress, mild dyspnea when speaking in long sentences  SKIN:  skin color, texture, turgor are normal, no rashes or significant lesions; did not examine the breast wound today  EYES: conjunctiva are pink and non-injected, sclera clear OROPHARYNX: no exudate, no erythema; lips, buccal mucosa, and tongue normal  NECK: supple, non-tender LUNGS: decreased air movement in bilateral lung bases, no wheezing, occasional crackles in the lung bases  HEART: regular rate & rhythm and no murmurs and no lower extremity edema ABDOMEN: soft, non-tender, non-distended, normal bowel sounds Musculoskeletal: no cyanosis of digits and no clubbing  PSYCH: alert & oriented x 3, fluent speech NEURO: no focal motor/sensory deficits  LABORATORY DATA:  I have reviewed the data as listed    Component Value Date/Time   NA 136 01/16/2019 1400   K 4.1 01/16/2019 1400   CL 99 01/16/2019 1400   CO2 29 01/16/2019 1400   GLUCOSE  106 (H) 01/16/2019 1400   BUN 7 01/16/2019 1400   CREATININE 0.67 01/16/2019 1400   CALCIUM 9.4 01/16/2019 1400   PROT 6.8 01/16/2019 1400   ALBUMIN 3.4 (L) 01/16/2019 1400   AST 13 (L) 01/16/2019 1400   ALT 10 01/16/2019 1400   ALKPHOS 88 01/16/2019 1400   BILITOT 0.5 01/16/2019 1400   GFRNONAA >60 01/16/2019 1400   GFRAA >60 01/16/2019 1400    No results found for: SPEP, UPEP  Lab Results  Component Value Date   WBC 4.4 01/16/2019   NEUTROABS 2.7 01/16/2019   HGB 8.6 (L) 01/16/2019   HCT 28.6 (L) 01/16/2019   MCV 86.1 01/16/2019   PLT 436 (H) 01/16/2019      Chemistry      Component Value Date/Time   NA 136 01/16/2019 1400   K 4.1 01/16/2019 1400   CL 99 01/16/2019 1400   CO2 29 01/16/2019 1400   BUN 7 01/16/2019 1400   CREATININE 0.67 01/16/2019 1400      Component Value Date/Time   CALCIUM 9.4 01/16/2019 1400   ALKPHOS 88 01/16/2019 1400   AST 13 (L) 01/16/2019 1400   ALT 10 01/16/2019 1400   BILITOT 0.5 01/16/2019 1400       RADIOGRAPHIC STUDIES: I have personally reviewed the radiological images as listed below and  agreed with the findings in the report. Dg Chest 2 View  Result Date: 01/04/2019 CLINICAL DATA:  Productive cough.  History of breast carcinoma EXAM: CHEST - 2 VIEW COMPARISON:  Chest radiograph October 30, 2018 and chest CT November 06, 2018 FINDINGS: Multiple nodular opacities are noted bilaterally consistent with metastatic foci. There is airspace consolidation throughout the left lower lobe, likely pneumonia. Heart is upper normal in size with pulmonary vascularity normal. Port-A-Cath tip is at the cavoatrial junction. No pneumothorax. No bone lesions evident. IMPRESSION: 1. Widespread metastatic disease with multiple nodular opacities in both lungs. 2. Extensive airspace consolidation left lower lobe, likely pneumonia. 3.  Port-A-Cath tip at cavoatrial junction. Electronically Signed   By: Lowella Grip III M.D.   On: 01/04/2019 15:10

## 2019-01-16 ENCOUNTER — Other Ambulatory Visit: Payer: Self-pay

## 2019-01-16 ENCOUNTER — Inpatient Hospital Stay (HOSPITAL_BASED_OUTPATIENT_CLINIC_OR_DEPARTMENT_OTHER): Payer: BC Managed Care – PPO | Admitting: Hematology

## 2019-01-16 ENCOUNTER — Inpatient Hospital Stay: Payer: BC Managed Care – PPO | Attending: Hematology

## 2019-01-16 ENCOUNTER — Encounter: Payer: Self-pay | Admitting: Hematology

## 2019-01-16 ENCOUNTER — Inpatient Hospital Stay: Payer: BC Managed Care – PPO

## 2019-01-16 VITALS — BP 130/82 | HR 111 | Temp 99.5°F | Resp 18 | Ht 64.0 in | Wt 223.0 lb

## 2019-01-16 DIAGNOSIS — Z5112 Encounter for antineoplastic immunotherapy: Secondary | ICD-10-CM | POA: Insufficient documentation

## 2019-01-16 DIAGNOSIS — R7989 Other specified abnormal findings of blood chemistry: Secondary | ICD-10-CM | POA: Diagnosis not present

## 2019-01-16 DIAGNOSIS — C50812 Malignant neoplasm of overlapping sites of left female breast: Secondary | ICD-10-CM | POA: Diagnosis not present

## 2019-01-16 DIAGNOSIS — Z171 Estrogen receptor negative status [ER-]: Secondary | ICD-10-CM

## 2019-01-16 DIAGNOSIS — Z5111 Encounter for antineoplastic chemotherapy: Secondary | ICD-10-CM | POA: Diagnosis not present

## 2019-01-16 DIAGNOSIS — D72829 Elevated white blood cell count, unspecified: Secondary | ICD-10-CM | POA: Insufficient documentation

## 2019-01-16 DIAGNOSIS — G893 Neoplasm related pain (acute) (chronic): Secondary | ICD-10-CM | POA: Diagnosis not present

## 2019-01-16 DIAGNOSIS — J910 Malignant pleural effusion: Secondary | ICD-10-CM | POA: Insufficient documentation

## 2019-01-16 DIAGNOSIS — C7800 Secondary malignant neoplasm of unspecified lung: Secondary | ICD-10-CM | POA: Insufficient documentation

## 2019-01-16 DIAGNOSIS — T451X5S Adverse effect of antineoplastic and immunosuppressive drugs, sequela: Secondary | ICD-10-CM | POA: Diagnosis not present

## 2019-01-16 DIAGNOSIS — D649 Anemia, unspecified: Secondary | ICD-10-CM | POA: Insufficient documentation

## 2019-01-16 DIAGNOSIS — C7931 Secondary malignant neoplasm of brain: Secondary | ICD-10-CM | POA: Diagnosis not present

## 2019-01-16 DIAGNOSIS — D6481 Anemia due to antineoplastic chemotherapy: Secondary | ICD-10-CM | POA: Insufficient documentation

## 2019-01-16 LAB — MAGNESIUM: Magnesium: 1.8 mg/dL (ref 1.7–2.4)

## 2019-01-16 LAB — CBC WITH DIFFERENTIAL (CANCER CENTER ONLY)
Abs Immature Granulocytes: 0.25 10*3/uL — ABNORMAL HIGH (ref 0.00–0.07)
Basophils Absolute: 0 10*3/uL (ref 0.0–0.1)
Basophils Relative: 1 %
Eosinophils Absolute: 0 10*3/uL (ref 0.0–0.5)
Eosinophils Relative: 0 %
HCT: 28.6 % — ABNORMAL LOW (ref 36.0–46.0)
Hemoglobin: 8.6 g/dL — ABNORMAL LOW (ref 12.0–15.0)
Immature Granulocytes: 6 %
Lymphocytes Relative: 12 %
Lymphs Abs: 0.5 10*3/uL — ABNORMAL LOW (ref 0.7–4.0)
MCH: 25.9 pg — ABNORMAL LOW (ref 26.0–34.0)
MCHC: 30.1 g/dL (ref 30.0–36.0)
MCV: 86.1 fL (ref 80.0–100.0)
Monocytes Absolute: 0.9 10*3/uL (ref 0.1–1.0)
Monocytes Relative: 21 %
Neutro Abs: 2.7 10*3/uL (ref 1.7–7.7)
Neutrophils Relative %: 60 %
Platelet Count: 436 10*3/uL — ABNORMAL HIGH (ref 150–400)
RBC: 3.32 MIL/uL — ABNORMAL LOW (ref 3.87–5.11)
RDW: 19.3 % — ABNORMAL HIGH (ref 11.5–15.5)
WBC Count: 4.4 10*3/uL (ref 4.0–10.5)
nRBC: 0.5 % — ABNORMAL HIGH (ref 0.0–0.2)

## 2019-01-16 LAB — CMP (CANCER CENTER ONLY)
ALT: 10 U/L (ref 0–44)
AST: 13 U/L — ABNORMAL LOW (ref 15–41)
Albumin: 3.4 g/dL — ABNORMAL LOW (ref 3.5–5.0)
Alkaline Phosphatase: 88 U/L (ref 38–126)
Anion gap: 8 (ref 5–15)
BUN: 7 mg/dL (ref 6–20)
CO2: 29 mmol/L (ref 22–32)
Calcium: 9.4 mg/dL (ref 8.9–10.3)
Chloride: 99 mmol/L (ref 98–111)
Creatinine: 0.67 mg/dL (ref 0.44–1.00)
GFR, Est AFR Am: >60 mL/min (ref >60)
GFR, Estimated: >60 mL/min (ref >60)
Glucose, Bld: 106 mg/dL — ABNORMAL HIGH (ref 70–99)
Potassium: 4.1 mmol/L (ref 3.5–5.1)
Sodium: 136 mmol/L (ref 135–145)
Total Bilirubin: 0.5 mg/dL (ref 0.3–1.2)
Total Protein: 6.8 g/dL (ref 6.5–8.1)

## 2019-01-16 LAB — TYPE AND SCREEN
ABO/RH(D): A POS
Antibody Screen: NEGATIVE

## 2019-01-17 ENCOUNTER — Telehealth: Payer: Self-pay | Admitting: Hematology

## 2019-01-17 NOTE — Telephone Encounter (Signed)
No change in appts per 6/9 los

## 2019-01-22 ENCOUNTER — Encounter (HOSPITAL_BASED_OUTPATIENT_CLINIC_OR_DEPARTMENT_OTHER): Payer: BC Managed Care – PPO | Attending: Internal Medicine

## 2019-01-22 ENCOUNTER — Other Ambulatory Visit: Payer: Self-pay | Admitting: *Deleted

## 2019-01-22 DIAGNOSIS — T22442A Corrosion of unspecified degree of left axilla, initial encounter: Secondary | ICD-10-CM | POA: Diagnosis not present

## 2019-01-22 DIAGNOSIS — Y842 Radiological procedure and radiotherapy as the cause of abnormal reaction of the patient, or of later complication, without mention of misadventure at the time of the procedure: Secondary | ICD-10-CM | POA: Diagnosis not present

## 2019-01-22 DIAGNOSIS — T2141XA Corrosion of unspecified degree of chest wall, initial encounter: Secondary | ICD-10-CM | POA: Diagnosis not present

## 2019-01-22 DIAGNOSIS — Z171 Estrogen receptor negative status [ER-]: Secondary | ICD-10-CM | POA: Insufficient documentation

## 2019-01-22 DIAGNOSIS — C50912 Malignant neoplasm of unspecified site of left female breast: Secondary | ICD-10-CM | POA: Diagnosis not present

## 2019-01-22 DIAGNOSIS — S21002A Unspecified open wound of left breast, initial encounter: Secondary | ICD-10-CM | POA: Diagnosis not present

## 2019-01-22 DIAGNOSIS — Z923 Personal history of irradiation: Secondary | ICD-10-CM | POA: Diagnosis not present

## 2019-01-22 NOTE — Progress Notes (Signed)
[place] OFFICE PROGRESS NOTE  Patient Care Team: Rakes, Connye Burkitt, FNP as PCP - General (Family Medicine) Tish Men, MD as Medical Oncologist (Hematology) Cordelia Poche, RN as Oncology Nurse Navigator  HEME/ONC OVERVIEW: 1. 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 Port in 10/2018   3. Pleur-X in late 10/2018   TREATMENT REGIMEN:  10/23/2018 - 11/07/2018: palliative RT to the left breast x 2 weeks  11/01/2018: PleurX catheter for recurrent malignant left pleural effusion   11/08/2018: SBRT to the brain lesion, 20 Gy/1 fraction  11/09/2018 - present: Taxotere, Herceptin and Perjeta (Onpro on hold)  Taxotere dose reduced to 627mm2 due to anemia  ASSESSMENT & PLAN:   Stage IV (c([id number] left breast cancer with mets to the brain and lungs  -S/p 3 cycles of palliative THP in the 1st line treatment -Labs adequate today, proceed with Cycle 4 of chemotherapy -I have ordered CT CAP after the 4th cycle of treatment to assess interim response -q3m8monthE monitoring while on Herceptin; I have ordered TTE for  early 02/2019 -PRN anti-emetics: Zofran, Compazine, Ativan and dexamethasone  Malignant left pleural effusion  -S/p Pleurx catheter placement in late 10/2018  -Overall, patient's exertional dyspnea remains stable  -CT CAP as above to assess the disease status, including the left pleural effusion  -If dyspnea worsens, we can reach out to IR regarding further management, given recent CT showed some loculated left-sided pleural effusion  Metastasis to the brain  -S/p SBRT to the brain met on 11/08/2018 -Repeat MRI brain scheduled on 02/08/2019 -Patient denies any new focal neurologic deficits  -We will monitor it for now   Chemotherapy-associated anemia -Secondary to chemotherapy -Hgb 8.7k, stable -Patient denies any symptom of bleeding -Taxotere dose reduced for anemia -Patient has required periodic RBC transfusion; if anemia persists, we can consider adding Aranesp with chemotherapy  -We will monitor for now  Leukocytosis -WBC 16.6k today, likely reactive in the setting of malignancy and steroid -Patient denies any symptoms of infection; she takes her dexamethasone the day prior to each cycle of chemotherapy, likely contributing to the leukocytosis  -We will monitor it for now  Thrombocytosis -Likely reactive in the setting of malignancy -Plts 561k, higher than last visit -We will monitor it for now  Breast wound -Overall improving, followed by wound care center   Cancer-related pain -Secondary to the breast malignancy and catheter placement -On MS-Contin 23m23mD w/ PRN oxycodone 10mg95mrs PRN for breakthrough pain -Pain relatively well controlled -Continue pain regimen above   Orders Placed This Encounter  Procedures  . CT CHEST W CONTRAST    Standing Status:   Future    Standing Expiration Date:   01/25/2020  Order Specific Question:   ** REASON FOR EXAM (FREE TEXT)    Answer:   Metastatic breast cancer on 1st line chemo, assess interim response    Order Specific  Question:   If indicated for the ordered procedure, I authorize the administration of contrast media per Radiology protocol    Answer:   Yes    Order Specific Question:   Preferred imaging location?    Answer:   [facility] Specific Question:   Radiology Contrast Protocol - do NOT remove file path    Answer:   \\[email or web address]    Order Specific Question:   Is patient pregnant?    Answer:   Unknown (Please Explain)  . CT ABDOMEN PELVIS W CONTRAST    Standing Status:   Future    Standing Expiration Date:   01/25/2020    Order Specific Question:   ** REASON FOR EXAM (FREE TEXT)    Answer:   Metastatic breast cancer on 1st line chemo, assess interim response    Order Specific Question:   If indicated for the ordered procedure, I authorize the administration of contrast media per Radiology protocol    Answer:   Yes    Order Specific Question:   Preferred imaging location?    Answer:   [facility] Specific Question:   Is Oral Contrast requested for this exam?    Answer:   Yes, Per Radiology protocol    Order Specific Question:   Radiology Contrast Protocol - do NOT remove file path    Answer:   \\[email or web address]    Order Specific Question:   Is patient pregnant?    Answer:   Unknown (Please Explain)    All questions were answered. The patient knows to call the clinic with any problems, questions or concerns. No barriers to learning was detected.  Return in 3 weeks for labs, port flush, clinic appt, imaging results and Cycle 5 of chemotherapy.  Tish Men, MD 01/25/2019 10:56 AM  CHIEF COMPLAINT: "I am doing okay"  INTERVAL HISTORY: Ms. Braaten returns to clinic for follow-up of metastatic breast cancer on Taxotere, Herceptin and Perjeta.  Patient reports that she has some intermittent hot flashes over the past few weeks, which she attributes to early menopause.  She has not had any menstrual cycles  since starting chemotherapy.  She denies any fever.  She has overall stable exertional dyspnea, and uses 2 L O2 at rest and 3 L with exertion.  Her breast wound has continued to improve, and at her recent wound care clinic, there was no evidence of infection in the breast wound.  She has some chronic numbness under the left axilla, as well as intermittent pain in the left breast, for which she takes MS Contin and as needed oxycodone with adequate control of the pain.  She denies any nausea or diarrhea.  SUMMARY OF ONCOLOGIC HISTORY: Oncology History  Malignant neoplasm of overlapping sites of breast ([place])  09/30/2018 Imaging   CT chest with contrast (at [facility]): Impressions: 1.  Dermal thickening of the multiple masses within the left breast as well as a invasive 6 cm mass of the left pectoralis major, compatible with primary breast cancer. 2.  Numerous pulmonary metastases.  Left axillary, retropectoral, supraclavicular, and upper mediastinal metastatic lymphadenopathy.  Mildly enlarged right axillary and supraclavicular lymph nodes may be metastatic. 3.  Indeterminant subcentimeter lucency in the dome of the right liver. 4.  Small left  pleural effusion.   10/10/2018 Initial Diagnosis   Breast cancer ([place])   10/17/2018 Imaging   CTA chest: IMPRESSION: 1. No evidence of lobar or more central pulmonary embolus. Nondiagnostic segmental assessment. 2. Moderate left pleural effusion, increased in size from the prior CT with increasing left lung atelectasis. 3. Unchanged appearance of multiple left breast masses including an invasive mass involving the pectoralis major. 4. Unchanged left axillary, left subpectoral, and mediastinal lymphadenopathy. 5. Mild interval enlargement of multiple lung metastases and of right axillary lymph nodes.   10/18/2018 Imaging   MRI brain w/o and w/ contrast: Single 4 mm focus of enhancement within right frontal white matter probably representing  metastatic disease. Attention at follow-up is recommended.   10/19/2018 Imaging   CT abdomen/pelvis w/ contrast: IMPRESSION: 1. No CT evidence of metastatic disease involving the abdomen or pelvis. 2. Left pleural effusion associated atelectasis consolidation with numerous pulmonary masses and nodules on partially imaged. Skin thickening of the left breast. Findings are in keeping with advanced breast malignancy and as seen on recent CT of the chest, 10/17/2018.   10/19/2018 Procedure   US-guided bx of left supraclavicular LN    10/19/2018 Pathology Results   Accession: [id number]  Lymph node, needle/core biopsy, left supraclavicular - METASTATIC CARCINOMA   11/09/2018 -  Chemotherapy   The patient had pegfilgrastim-cbqv (UDENYCA) injection 6 mg, 6 mg, Subcutaneous, Once, 1 of 1 cycle Administration: 6 mg (11/10/2018) trastuzumab (HERCEPTIN) 900 mg in sodium chloride 0.9 % 250 mL chemo infusion, 966 mg, Intravenous,  Once, 4 of 7 cycles Administration: 900 mg (11/09/2018), 600 mg (12/14/2018), 600 mg (01/04/2019) DOCEtaxel (TAXOTERE) 170 mg in sodium chloride 0.9 % 250 mL chemo infusion, 75 mg/m2 = 170 mg, Intravenous,  Once, 4 of 7 cycles Dose modification: 60 mg/m2 (original dose 75 mg/m2, Cycle 2, Reason: Dose not tolerated) Administration: 170 mg (11/09/2018), 140 mg (12/14/2018), 140 mg (01/04/2019) pertuzumab (PERJETA) 840 mg in sodium chloride 0.9 % 250 mL chemo infusion, 840 mg, Intravenous, Once, 4 of 7 cycles Administration: 840 mg (11/09/2018), 420 mg (12/14/2018), 420 mg (01/04/2019)  for chemotherapy treatment.      REVIEW OF SYSTEMS:   Constitutional: ( - ) fevers, ( + )  chills , ( - ) night sweats Eyes: ( - ) blurriness of vision, ( - ) double vision, ( - ) watery eyes Ears, nose, mouth, throat, and face: ( - ) mucositis, ( - ) sore throat Respiratory: ( - ) cough, ( + ) dyspnea, ( - ) wheezes Cardiovascular: ( - ) palpitation, ( - ) chest discomfort, ( - ) lower extremity  swelling Gastrointestinal:  ( - ) nausea, ( - ) heartburn, ( - ) change in bowel habits Skin: ( - ) abnormal skin rashes Lymphatics: ( - ) new lymphadenopathy, ( - ) easy bruising Neurological: ( + ) numbness, ( - ) tingling, ( - ) new weaknesses Behavioral/Psych: ( - ) mood change, ( - ) new changes  All other systems were reviewed with the patient and are negative.  I have reviewed the past medical history, past surgical history, social history and family history with the patient and they are unchanged from previous note.  ALLERGIES:  is allergic to aspirin.  MEDICATIONS:  Current Outpatient Medications  Medication Sig Dispense Refill  . dexamethasone (DECADRON) 4 MG tablet Take 2 tablets (8 mg total) by mouth 2 (two) times daily. Start the day before Taxotere. Then daily after chemo for 2 days. 30 tablet 4  .  gabapentin (NEURONTIN) 300 MG capsule TAKE 2 CAPSULES (600 MG TOTAL) BY MOUTH 2 (TWO) TIMES DAILY. 360 capsule 2  . lidocaine-prilocaine (EMLA) cream Apply to affected area once 30 g 3  . LORazepam (ATIVAN) 0.5 MG tablet Take 1 tablet (0.5 mg total) by mouth every 6 (six) hours as needed (Nausea or vomiting). 30 tablet 0  . morphine (MS CONTIN) 30 MG 12 hr tablet Take 1 tablet (30 mg total) by mouth every 12 (twelve) hours. 30 tablet 0  . ondansetron (ZOFRAN) 8 MG tablet Take 1 tablet (8 mg total) by mouth 2 (two) times daily as needed for refractory nausea / vomiting. 30 tablet 4  . oxyCODONE (OXY IR/ROXICODONE) 5 MG immediate release tablet Take 2 tablets (10 mg total) by mouth every 6 (six) hours as needed for up to 30 days for severe pain. 90 tablet 0  . prochlorperazine (COMPAZINE) 10 MG tablet Take 1 tablet (10 mg total) by mouth every 6 (six) hours as needed (Nausea or vomiting). 30 tablet 4   No current facility-administered medications for this visit.    Facility-Administered Medications Ordered in Other Visits  Medication Dose Route Frequency Provider Last Rate Last Dose   . DOCEtaxel (TAXOTERE) 140 mg in sodium chloride 0.9 % 250 mL chemo infusion  60 mg/m2 (Treatment Plan Recorded) Intravenous Once Tish Men, MD      . heparin lock flush 100 unit/mL  500 Units Intracatheter Once PRN Tish Men, MD      . pertuzumab (PERJETA) 420 mg in sodium chloride 0.9 % 250 mL chemo infusion  420 mg Intravenous Once Tish Men, MD      . sodium chloride flush (NS) 0.9 % injection 10 mL  10 mL Intracatheter PRN Tish Men, MD      . trastuzumab (HERCEPTIN) 600 mg in sodium chloride 0.9 % 250 mL chemo infusion  600 mg Intravenous Once Volanda Napoleon, MD        PHYSICAL EXAMINATION: ECOG PERFORMANCE STATUS: 2 - Symptomatic, <50% confined to bed  Today's Vitals   01/25/19 1017  BP: 122/75  Pulse: (!) 107  Resp: 19  Temp: 98.7 F (37.1 C)  TempSrc: Oral  SpO2: 100%  Weight: 219 lb (99.3 kg)  Height: 5' 4"  (1.626 m)  PainSc: 0-No pain   Body mass index is 37.59 kg/m.  Filed Weights   01/25/19 1017  Weight: 219 lb (99.3 kg)    GENERAL: alert, no distress SKIN: skin color, texture, turgor are normal, no rashes or significant lesions EYES: conjunctiva are pink and non-injected, sclera clear OROPHARYNX: no exudate, no erythema; lips, buccal mucosa, and tongue normal  NECK: supple, non-tender LUNGS: clear to auscultation bilaterally  HEART: regular rhythm, mild tachycardia, and no murmurs and no lower extremity edema ABDOMEN: soft, non-tender, non-distended, normal bowel sounds Musculoskeletal: no cyanosis of digits and no clubbing  PSYCH: alert & oriented x 3, fluent speech NEURO: no focal motor/sensory deficits  LABORATORY DATA:  I have reviewed the data as listed    Component Value Date/Time   NA 138 01/25/2019 1000   K 4.5 01/25/2019 1000   CL 100 01/25/2019 1000   CO2 27 01/25/2019 1000   GLUCOSE 121 (H) 01/25/2019 1000   BUN 11 01/25/2019 1000   CREATININE 0.72 01/25/2019 1000   CALCIUM 10.1 01/25/2019 1000   PROT 7.5 01/25/2019 1000   ALBUMIN  3.9 01/25/2019 1000   AST 14 (L) 01/25/2019 1000   ALT 12 01/25/2019 1000   ALKPHOS 86  01/25/2019 1000   BILITOT 0.4 01/25/2019 1000   GFRNONAA >60 01/25/2019 1000   GFRAA >60 01/25/2019 1000    No results found for: SPEP, UPEP  Lab Results  Component Value Date   WBC 16.6 (H) 01/25/2019   NEUTROABS 14.6 (H) 01/25/2019   HGB 8.7 (L) 01/25/2019   HCT 29.4 (L) 01/25/2019   MCV 85.7 01/25/2019   PLT 561 (H) 01/25/2019      Chemistry      Component Value Date/Time   NA 138 01/25/2019 1000   K 4.5 01/25/2019 1000   CL 100 01/25/2019 1000   CO2 27 01/25/2019 1000   BUN 11 01/25/2019 1000   CREATININE 0.72 01/25/2019 1000      Component Value Date/Time   CALCIUM 10.1 01/25/2019 1000   ALKPHOS 86 01/25/2019 1000   AST 14 (L) 01/25/2019 1000   ALT 12 01/25/2019 1000   BILITOT 0.4 01/25/2019 1000       RADIOGRAPHIC STUDIES: I have personally reviewed the radiological images as listed below and agreed with the findings in the report. Dg Chest 2 View  Result Date: 01/04/2019 CLINICAL DATA:  Productive cough.  History of breast carcinoma EXAM: CHEST - 2 VIEW COMPARISON:  Chest radiograph October 30, 2018 and chest CT November 06, 2018 FINDINGS: Multiple nodular opacities are noted bilaterally consistent with metastatic foci. There is airspace consolidation throughout the left lower lobe, likely pneumonia. Heart is upper normal in size with pulmonary vascularity normal. Port-A-Cath tip is at the cavoatrial junction. No pneumothorax. No bone lesions evident. IMPRESSION: 1. Widespread metastatic disease with multiple nodular opacities in both lungs. 2. Extensive airspace consolidation left lower lobe, likely pneumonia. 3.  Port-A-Cath tip at cavoatrial junction. Electronically Signed   By: Lowella Grip III M.D.   On: 01/04/2019 15:10

## 2019-01-23 ENCOUNTER — Encounter (HOSPITAL_BASED_OUTPATIENT_CLINIC_OR_DEPARTMENT_OTHER): Payer: BC Managed Care – PPO

## 2019-01-23 MED ORDER — MORPHINE SULFATE ER 30 MG PO TBCR: 30.0000 mg | EXTENDED_RELEASE_TABLET | Freq: Two times a day (BID) | ORAL | 0 refills | Status: DC

## 2019-01-24 ENCOUNTER — Other Ambulatory Visit: Payer: Self-pay | Admitting: Hematology

## 2019-01-25 ENCOUNTER — Inpatient Hospital Stay: Payer: BC Managed Care – PPO

## 2019-01-25 ENCOUNTER — Encounter: Payer: Self-pay | Admitting: Hematology

## 2019-01-25 ENCOUNTER — Other Ambulatory Visit: Payer: Self-pay

## 2019-01-25 ENCOUNTER — Inpatient Hospital Stay (HOSPITAL_BASED_OUTPATIENT_CLINIC_OR_DEPARTMENT_OTHER): Payer: BC Managed Care – PPO | Admitting: Hematology

## 2019-01-25 ENCOUNTER — Telehealth: Payer: Self-pay | Admitting: Hematology

## 2019-01-25 ENCOUNTER — Encounter: Payer: Self-pay | Admitting: *Deleted

## 2019-01-25 VITALS — BP 122/75 | HR 107 | Temp 98.7°F | Resp 19 | Ht 64.0 in | Wt 219.0 lb

## 2019-01-25 DIAGNOSIS — D75839 Thrombocytosis, unspecified: Secondary | ICD-10-CM

## 2019-01-25 DIAGNOSIS — C50812 Malignant neoplasm of overlapping sites of left female breast: Secondary | ICD-10-CM

## 2019-01-25 DIAGNOSIS — Z171 Estrogen receptor negative status [ER-]: Secondary | ICD-10-CM

## 2019-01-25 DIAGNOSIS — D6481 Anemia due to antineoplastic chemotherapy: Secondary | ICD-10-CM | POA: Diagnosis not present

## 2019-01-25 DIAGNOSIS — J910 Malignant pleural effusion: Secondary | ICD-10-CM | POA: Diagnosis not present

## 2019-01-25 DIAGNOSIS — D72829 Elevated white blood cell count, unspecified: Secondary | ICD-10-CM

## 2019-01-25 DIAGNOSIS — D649 Anemia, unspecified: Secondary | ICD-10-CM | POA: Diagnosis not present

## 2019-01-25 DIAGNOSIS — C7800 Secondary malignant neoplasm of unspecified lung: Secondary | ICD-10-CM | POA: Diagnosis not present

## 2019-01-25 DIAGNOSIS — C7931 Secondary malignant neoplasm of brain: Secondary | ICD-10-CM | POA: Diagnosis not present

## 2019-01-25 DIAGNOSIS — R7989 Other specified abnormal findings of blood chemistry: Secondary | ICD-10-CM | POA: Diagnosis not present

## 2019-01-25 DIAGNOSIS — D473 Essential (hemorrhagic) thrombocythemia: Secondary | ICD-10-CM

## 2019-01-25 DIAGNOSIS — G893 Neoplasm related pain (acute) (chronic): Secondary | ICD-10-CM

## 2019-01-25 DIAGNOSIS — Z5111 Encounter for antineoplastic chemotherapy: Secondary | ICD-10-CM | POA: Diagnosis not present

## 2019-01-25 DIAGNOSIS — T451X5S Adverse effect of antineoplastic and immunosuppressive drugs, sequela: Secondary | ICD-10-CM | POA: Diagnosis not present

## 2019-01-25 DIAGNOSIS — Z5112 Encounter for antineoplastic immunotherapy: Secondary | ICD-10-CM | POA: Diagnosis not present

## 2019-01-25 LAB — CMP (CANCER CENTER ONLY)
ALT: 12 U/L (ref 0–44)
AST: 14 U/L — ABNORMAL LOW (ref 15–41)
Albumin: 3.9 g/dL (ref 3.5–5.0)
Alkaline Phosphatase: 86 U/L (ref 38–126)
Anion gap: 11 (ref 5–15)
BUN: 11 mg/dL (ref 6–20)
CO2: 27 mmol/L (ref 22–32)
Calcium: 10.1 mg/dL (ref 8.9–10.3)
Chloride: 100 mmol/L (ref 98–111)
Creatinine: 0.72 mg/dL (ref 0.44–1.00)
GFR, Est AFR Am: >60 mL/min (ref >60)
GFR, Estimated: >60 mL/min (ref >60)
Glucose, Bld: 121 mg/dL — ABNORMAL HIGH (ref 70–99)
Potassium: 4.5 mmol/L (ref 3.5–5.1)
Sodium: 138 mmol/L (ref 135–145)
Total Bilirubin: 0.4 mg/dL (ref 0.3–1.2)
Total Protein: 7.5 g/dL (ref 6.5–8.1)

## 2019-01-25 LAB — CBC WITH DIFFERENTIAL (CANCER CENTER ONLY)
Abs Immature Granulocytes: 0.75 10*3/uL — ABNORMAL HIGH (ref 0.00–0.07)
Basophils Absolute: 0 10*3/uL (ref 0.0–0.1)
Basophils Relative: 0 %
Eosinophils Absolute: 0 10*3/uL (ref 0.0–0.5)
Eosinophils Relative: 0 %
HCT: 29.4 % — ABNORMAL LOW (ref 36.0–46.0)
Hemoglobin: 8.7 g/dL — ABNORMAL LOW (ref 12.0–15.0)
Immature Granulocytes: 5 %
Lymphocytes Relative: 4 %
Lymphs Abs: 0.6 10*3/uL — ABNORMAL LOW (ref 0.7–4.0)
MCH: 25.4 pg — ABNORMAL LOW (ref 26.0–34.0)
MCHC: 29.6 g/dL — ABNORMAL LOW (ref 30.0–36.0)
MCV: 85.7 fL (ref 80.0–100.0)
Monocytes Absolute: 0.7 10*3/uL (ref 0.1–1.0)
Monocytes Relative: 4 %
Neutro Abs: 14.6 10*3/uL — ABNORMAL HIGH (ref 1.7–7.7)
Neutrophils Relative %: 87 %
Platelet Count: 561 10*3/uL — ABNORMAL HIGH (ref 150–400)
RBC: 3.43 MIL/uL — ABNORMAL LOW (ref 3.87–5.11)
RDW: 19.9 % — ABNORMAL HIGH (ref 11.5–15.5)
WBC Count: 16.6 10*3/uL — ABNORMAL HIGH (ref 4.0–10.5)
nRBC: 0.1 % (ref 0.0–0.2)

## 2019-01-25 LAB — MAGNESIUM: Magnesium: 2 mg/dL (ref 1.7–2.4)

## 2019-01-25 MED ORDER — ACETAMINOPHEN 325 MG PO TABS
ORAL_TABLET | ORAL | Status: AC
  Filled 2019-01-25: qty 2

## 2019-01-25 MED ORDER — DIPHENHYDRAMINE HCL 50 MG/ML IJ SOLN
INTRAMUSCULAR | Status: AC
  Filled 2019-01-25: qty 1

## 2019-01-25 MED ORDER — SODIUM CHLORIDE 0.9 % IV SOLN
420.0000 mg | Freq: Once | INTRAVENOUS | Status: AC
  Administered 2019-01-25: 420 mg via INTRAVENOUS
  Filled 2019-01-25: qty 14

## 2019-01-25 MED ORDER — DEXAMETHASONE SODIUM PHOSPHATE 10 MG/ML IJ SOLN
INTRAMUSCULAR | Status: AC
  Filled 2019-01-25: qty 1

## 2019-01-25 MED ORDER — SODIUM CHLORIDE 0.9 % IV SOLN
Freq: Once | INTRAVENOUS | Status: AC
  Administered 2019-01-25: 11:00:00 via INTRAVENOUS
  Filled 2019-01-25: qty 250

## 2019-01-25 MED ORDER — SODIUM CHLORIDE 0.9% FLUSH
10.0000 mL | INTRAVENOUS | Status: DC | PRN
  Administered 2019-01-25: 15:00:00 10 mL
  Filled 2019-01-25: qty 10

## 2019-01-25 MED ORDER — TRASTUZUMAB CHEMO 150 MG IV SOLR
600.0000 mg | Freq: Once | INTRAVENOUS | Status: AC
  Administered 2019-01-25: 600 mg via INTRAVENOUS
  Filled 2019-01-25: qty 28.57

## 2019-01-25 MED ORDER — DIPHENHYDRAMINE HCL 25 MG PO CAPS
ORAL_CAPSULE | ORAL | Status: AC
  Filled 2019-01-25: qty 2

## 2019-01-25 MED ORDER — DIPHENHYDRAMINE HCL 25 MG PO CAPS
50.0000 mg | ORAL_CAPSULE | Freq: Once | ORAL | Status: AC
  Administered 2019-01-25: 11:00:00 50 mg via ORAL

## 2019-01-25 MED ORDER — ACETAMINOPHEN 325 MG PO TABS
650.0000 mg | ORAL_TABLET | Freq: Once | ORAL | Status: AC
  Administered 2019-01-25: 11:00:00 650 mg via ORAL

## 2019-01-25 MED ORDER — DEXAMETHASONE SODIUM PHOSPHATE 10 MG/ML IJ SOLN
10.0000 mg | Freq: Once | INTRAMUSCULAR | Status: AC
  Administered 2019-01-25: 10 mg via INTRAVENOUS

## 2019-01-25 MED ORDER — SODIUM CHLORIDE 0.9 % IV SOLN
60.0000 mg/m2 | Freq: Once | INTRAVENOUS | Status: AC
  Administered 2019-01-25: 140 mg via INTRAVENOUS
  Filled 2019-01-25: qty 14

## 2019-01-25 MED ORDER — HEPARIN SOD (PORK) LOCK FLUSH 100 UNIT/ML IV SOLN
500.0000 [IU] | Freq: Once | INTRAVENOUS | Status: AC | PRN
  Administered 2019-01-25: 500 [IU]
  Filled 2019-01-25: qty 5

## 2019-01-25 NOTE — Patient Instructions (Signed)

## 2019-01-25 NOTE — Patient Instructions (Signed)
[facility] Discharge Instructions for Patients Receiving Chemotherapy  Today you received the following chemotherapy agents:  Herceptin, Perjeta and Taxotere  To help prevent nausea and vomiting after your treatment, we encourage you to take your nausea medication as ordered per MD.    If you develop nausea and vomiting that is not controlled by your nausea medication, call the clinic.   BELOW ARE SYMPTOMS THAT SHOULD BE REPORTED IMMEDIATELY:  *FEVER GREATER THAN 100.5 F  *CHILLS WITH OR WITHOUT FEVER  NAUSEA AND VOMITING THAT IS NOT CONTROLLED WITH YOUR NAUSEA MEDICATION  *UNUSUAL SHORTNESS OF BREATH  *UNUSUAL BRUISING OR BLEEDING  TENDERNESS IN MOUTH AND THROAT WITH OR WITHOUT PRESENCE OF ULCERS  *URINARY PROBLEMS  *BOWEL PROBLEMS  UNUSUAL RASH Items with * indicate a potential emergency and should be followed up as soon as possible.  Feel free to call the clinic should you have any questions or concerns. The clinic phone number is [phone].  Please show the [facility] at check-in to the Emergency Department and triage nurse.

## 2019-01-25 NOTE — Telephone Encounter (Signed)
Echo and CT CAP prior to the next clinic appt on 02/15/2019/Scans will be scheduled once prior auth has been obtained from [facility] - Other appts unchanged  Per 6/18 los

## 2019-01-26 DIAGNOSIS — J9601 Acute respiratory failure with hypoxia: Secondary | ICD-10-CM | POA: Diagnosis not present

## 2019-01-29 ENCOUNTER — Other Ambulatory Visit: Payer: Self-pay | Admitting: Radiation Therapy

## 2019-02-05 DIAGNOSIS — J910 Malignant pleural effusion: Secondary | ICD-10-CM | POA: Diagnosis not present

## 2019-02-05 DIAGNOSIS — C50919 Malignant neoplasm of unspecified site of unspecified female breast: Secondary | ICD-10-CM | POA: Diagnosis not present

## 2019-02-05 DIAGNOSIS — S21002A Unspecified open wound of left breast, initial encounter: Secondary | ICD-10-CM | POA: Diagnosis not present

## 2019-02-05 DIAGNOSIS — T8131XD Disruption of external operation (surgical) wound, not elsewhere classified, subsequent encounter: Secondary | ICD-10-CM | POA: Diagnosis not present

## 2019-02-06 ENCOUNTER — Other Ambulatory Visit: Payer: Self-pay

## 2019-02-06 ENCOUNTER — Encounter (HOSPITAL_BASED_OUTPATIENT_CLINIC_OR_DEPARTMENT_OTHER): Payer: Self-pay

## 2019-02-06 ENCOUNTER — Ambulatory Visit (HOSPITAL_BASED_OUTPATIENT_CLINIC_OR_DEPARTMENT_OTHER)
Admission: RE | Admit: 2019-02-06 | Discharge: 2019-02-06 | Disposition: A | Discharge status: Home / Self Care | Payer: BC Managed Care – PPO | Source: Ambulatory Visit | Attending: Hematology | Admitting: Hematology

## 2019-02-06 DIAGNOSIS — Z171 Estrogen receptor negative status [ER-]: Secondary | ICD-10-CM

## 2019-02-06 DIAGNOSIS — C50812 Malignant neoplasm of overlapping sites of left female breast: Secondary | ICD-10-CM | POA: Insufficient documentation

## 2019-02-06 DIAGNOSIS — C50919 Malignant neoplasm of unspecified site of unspecified female breast: Secondary | ICD-10-CM | POA: Diagnosis not present

## 2019-02-06 DIAGNOSIS — C7951 Secondary malignant neoplasm of bone: Secondary | ICD-10-CM | POA: Diagnosis not present

## 2019-02-06 MED ORDER — IOHEXOL 300 MG/ML  SOLN
100.0000 mL | Freq: Once | INTRAMUSCULAR | Status: AC | PRN
  Administered 2019-02-06: 100 mL via INTRAVENOUS

## 2019-02-07 ENCOUNTER — Other Ambulatory Visit: Payer: Self-pay | Admitting: *Deleted

## 2019-02-07 DIAGNOSIS — C50812 Malignant neoplasm of overlapping sites of left female breast: Secondary | ICD-10-CM

## 2019-02-07 MED ORDER — MORPHINE SULFATE ER 30 MG PO TBCR: 30.0000 mg | EXTENDED_RELEASE_TABLET | Freq: Two times a day (BID) | ORAL | 0 refills | Status: DC

## 2019-02-07 MED ORDER — OXYCODONE HCL 5 MG PO TABS: 10.0000 mg | ORAL_TABLET | Freq: Four times a day (QID) | ORAL | 0 refills | Status: DC | PRN

## 2019-02-08 ENCOUNTER — Other Ambulatory Visit: Payer: Self-pay | Admitting: Hematology

## 2019-02-08 ENCOUNTER — Other Ambulatory Visit: Payer: BLUE CROSS/BLUE SHIELD

## 2019-02-08 DIAGNOSIS — Z171 Estrogen receptor negative status [ER-]: Secondary | ICD-10-CM

## 2019-02-08 DIAGNOSIS — C50812 Malignant neoplasm of overlapping sites of left female breast: Secondary | ICD-10-CM

## 2019-02-08 MED ORDER — SODIUM CHLORIDE 0.9 % IV SOLN: 3.6000 mg/kg | INTRAVENOUS | 0 refills | Status: DC

## 2019-02-08 NOTE — Progress Notes (Signed)
DISCONTINUE ON PATHWAY REGIMEN - Breast     A cycle is every 21 days:     Pertuzumab      Pertuzumab      Trastuzumab-xxxx      Trastuzumab-xxxx      Docetaxel   **Always confirm dose/schedule in your pharmacy ordering system**  REASON: Disease Progression PRIOR TREATMENT: [id number]: Docetaxel + Trastuzumab + Pertuzumab (THP) q21 Days Until Progression or Toxicity TREATMENT RESPONSE: Progressive Disease (PD)  START ON PATHWAY REGIMEN - Breast     A cycle is every 21 days:     Ado-trastuzumab emtansine   **Always confirm dose/schedule in your pharmacy ordering system**  Patient Characteristics: Distant Metastases or Locoregional Recurrent Disease - Unresected or Locally Advanced Unresectable Disease Progressing after Neoadjuvant and Local Therapies, HER2 Positive, ER Negative/Unknown, Chemotherapy, Second Line Therapeutic Status: Distant Metastases BRCA Mutation Status: Absent ER Status: Negative (-) HER2 Status: Positive (+) PR Status: Negative (-) Line of Therapy: Second Line Intent of Therapy: Non-Curative / Palliative Intent, Discussed with Patient

## 2019-02-12 ENCOUNTER — Ambulatory Visit: Payer: Self-pay | Admitting: Radiation Oncology

## 2019-02-12 ENCOUNTER — Other Ambulatory Visit: Payer: Self-pay | Admitting: Hematology

## 2019-02-14 ENCOUNTER — Other Ambulatory Visit: Payer: Self-pay | Admitting: Hematology

## 2019-02-14 DIAGNOSIS — J910 Malignant pleural effusion: Secondary | ICD-10-CM

## 2019-02-14 DIAGNOSIS — C50812 Malignant neoplasm of overlapping sites of left female breast: Secondary | ICD-10-CM

## 2019-02-14 NOTE — Progress Notes (Signed)
[place] OFFICE PROGRESS NOTE  Patient Care Team: Rakes, Connye Burkitt, FNP as PCP - General (Family Medicine) Tish Men, MD as Medical Oncologist (Hematology) Cordelia Poche, RN as Oncology Nurse Navigator  HEME/ONC OVERVIEW: 1. 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 Port in 10/2018   3. Pleur-X in late 10/2018   TREATMENT REGIMEN:  10/23/2018 - 11/07/2018: palliative RT to the left breast x 2 weeks  11/01/2018: PleurX catheter for recurrent malignant left pleural effusion   11/08/2018: SBRT to the brain lesion, 20 Gy/1 fraction  11/09/2018 - 01/25/2019: 1st line Taxotere, Herceptin and Perjeta (Onpro on hold)  Taxotere dose reduced to 636mm2 due to anemia  02/15/2019 - present: 2nd line Kadcyla   ASSESSMENT & PLAN:   Stage IV (c([id number] left breast cancer with mets to the brain and lungs  -S/p 4 cycles of palliative THP in the 1st line treatment -I  independently reviewed the radiologic images of CT CAP (after 4 cycles of THP), and agreed with the findings documented -In summary, CT CAP showed that while there was some evidence of disease improvement, including reduction in the primary left breast tumor and numerous bilateral pulmonary metastases, there was also definite evidence of disease progression, including new bony metastases, worsening axillary and mediastinal lymphadenopathy.  -I discussed the imaging results in detail with the patient -I reviewed NCCN guidelines in detail with the patient; given the rapid disease progression, I discussed with the patient about changing treatment to Kadcyla, including its mechanism of action, efficacy and some of the common side effects -The most common adverse reactions (> 25%) with KADCYLA were fatigue, nausea, musculoskeletal pain, hemorrhage, thrombocytopenia, headache, increased transaminases, constipation and epistaxis -The patient is aware that the response rates discussed earlier is not guaranteed.   -After a long discussion, patient made an informed decision to proceed with the prescribed plan of care.  -As the patient has recently changed her insurance to [facility] have obtained authorization to start [employer] 02/15/2019 -[id number] monitoring while on Kadcyla, next due in early 02/2019 -PRN anti-emetics: Zofran, Compazine, Ativan and dexamethasone  Malignant left pleural effusion  -S/p Pleurx catheter placement in late 10/2018  -Review of the recent CT CAP showed enlarging bilateral pleural effusion, L > R, despite PleurX; the catheter was not visualized on CT, indicating that it likely slipped out of position -I discussed the case with IR, who recommended removing the catheter and doing thoracentesis to relieve the fluid, with the plan to replace the PleurX catheter pending schedule availability  Metastasis to the brain  -S/p SBRT to  the brain met on 11/08/2018 -Repeat MRI brain scheduled  on 03/07/2019 -Patient denies any new focal neurologic deficits  -We will monitor it for now   Chemotherapy-associated anemia -Secondary to chemotherapy -Hgb 8.9, stable  -Patient denies any symptom of bleeding -Patient has required periodic RBC transfusion; if anemia persists, we can consider adding Aranesp with chemotherapy  -We will monitor for now  Leukocytosis -Likely reactive in the setting of underlying malignancy -WBC 14.3k today, stable -Patient denies any symptoms of infection, except a mild cough with mostly clear sputum production -We will monitor it for now  Thrombocytosis -Likely reactive in the setting of underlying malignancy -Plts 530k today, stable -We will monitor it for now  Breast wound -Overall improving, followed by wound care center  -Continue wound care as instructed  Cancer-related pain -Secondary to the breast malignancy and catheter placement -On MS-Contin 71m BID w/ PRN oxycodone [id number] PRN for breakthrough pain -Pain relatively well controlled -Continue pain regimen above   Orders Placed This Encounter  Procedures  . IR Removal Of Plural Cath W/Cuff    Standing Status:   Future    Standing Expiration Date:   04/17/2020    Order Specific Question:   Reason for exam:    Answer:   PleurX catheter not draining. Need removal and replacement of the pleurX.    Order Specific Question:   Is the patient pregnant?    Answer:   No    Order Specific Question:   Preferred Imaging Location?    Answer:   [facility]. IR THORACENTESIS ASP PLEURAL SPACE W/IMG GUIDE    Standing Status:   Future    Standing Expiration Date:   04/17/2020    Order Specific Question:   Are labs required for specimen collection?    Answer:   No    Order Specific Question:   Reason for Exam (SYMPTOM  OR DIAGNOSIS REQUIRED)    Answer:   Worsening dyspnea, met breast cancer with bilateral pleural effusion    Order Specific Question:   Is the patient pregnant?     Answer:   No    Order Specific Question:   Preferred Imaging Location?    Answer[facility]   [facility]  Dx Code:C50.812, Z17.1 Malignant neoplasm of overlapping sites of left breast in female, estrogen receptor negative   All questions were answered. The patient knows to call the clinic with any problems, questions or concerns. No barriers to learning was detected.  Return in 3 weeks for labs, port flush, clinic appt and Cycle 2 of Kadcyla.   Sharon MenMD 02/15/2019 10:53 AM  CHIEF COMPLAINT: "My breathing is about the same "  INTERVAL HISTORY: Ms. DaMassareturns to clinic for follow-up of metastatic HER-2 positive breast cancer.  Patient recently had CT CAP after 4 cycles of THP, which unfortunately showed significant disease progression.  Patient reports that her breathing remains overall the same, exacerbated by exertion, associated with some coughing with intermittent light yellow/clear sputum production.  She had one episode of panic attack due to shortness of breath, and has some intermittent heavy sensation with deep inspiration.  She denies any chest pain, palpitation, diaphoresis, or hemoptysis.  Her breast wounds are healing well, and she still has one small wound above the nipple in the left breast, for which she has been applying dressings.  She is scheduled to follow up with wound care clinic later this month.  She takes MS-Contin BID with PRN oxycodone for breakthrough pain with adequate pain control.   SUMMARY OF ONCOLOGIC HISTORY: Oncology History  Malignant neoplasm of overlapping sites of breast ([place])  09/30/2018 Imaging   CT chest with contrast (at [facility]): Impressions: 1.  Dermal thickening of the multiple masses within the left breast as well as a invasive 6 cm mass of the left pectoralis major, compatible with primary breast cancer. 2.  Numerous pulmonary metastases.  Left axillary, retropectoral, supraclavicular, and upper mediastinal  metastatic lymphadenopathy.  Mildly enlarged right axillary and supraclavicular lymph nodes may be metastatic. 3.  Indeterminant subcentimeter lucency in the dome of the right liver. 4.  Small left pleural effusion.   10/10/2018 Initial Diagnosis   Breast cancer ([place])   10/17/2018 Imaging   CTA chest: IMPRESSION: 1. No evidence of lobar or more central pulmonary embolus. Nondiagnostic segmental assessment. 2. Moderate left pleural effusion, increased in size from the prior CT with increasing left lung atelectasis. 3. Unchanged appearance of multiple left breast masses including an invasive mass involving the pectoralis major. 4. Unchanged left axillary, left subpectoral, and mediastinal lymphadenopathy. 5. Mild interval enlargement of multiple lung metastases and of right axillary lymph nodes.   10/18/2018 Imaging   MRI brain w/o and w/ contrast: Single 4 mm focus of enhancement within right frontal white matter probably representing metastatic disease. Attention at follow-up is recommended.   10/19/2018 Imaging   CT abdomen/pelvis w/ contrast: IMPRESSION: 1. No CT evidence of metastatic disease involving the abdomen or pelvis. 2. Left pleural effusion associated atelectasis consolidation with numerous pulmonary masses and nodules on partially imaged. Skin thickening of the left breast. Findings are in keeping with advanced breast malignancy and as seen on recent CT of the chest, 10/17/2018.   10/19/2018 Procedure   US-guided bx of left supraclavicular LN    10/19/2018 Pathology Results   Accession: [id number]  Lymph node, needle/core biopsy, left supraclavicular - METASTATIC CARCINOMA   11/09/2018 - 02/14/2019 Chemotherapy   The patient had pegfilgrastim-cbqv (UDENYCA) injection 6 mg, 6 mg, Subcutaneous, Once, 1 of 1 cycle Administration: 6 mg (11/10/2018) trastuzumab (HERCEPTIN) 900 mg in sodium chloride 0.9 % 250 mL chemo infusion, 966 mg, Intravenous,  Once, 4 of 7  cycles Administration: 900 mg (11/09/2018), 600 mg (12/14/2018), 600 mg (01/04/2019), 600 mg (01/25/2019) DOCEtaxel (TAXOTERE) 170 mg in sodium chloride 0.9 % 250 mL chemo infusion, 75 mg/m2 = 170 mg, Intravenous,  Once, 4 of 7 cycles Dose modification: 60 mg/m2 (original dose 75 mg/m2, Cycle 2, Reason: Dose not tolerated) Administration: 170 mg (11/09/2018), 140 mg (12/14/2018), 140 mg (01/04/2019), 140 mg (01/25/2019) pertuzumab (PERJETA) 840 mg in sodium chloride 0.9 % 250 mL chemo infusion, 840 mg, Intravenous, Once, 4 of 7 cycles Administration: 840 mg (11/09/2018), 420 mg (12/14/2018), 420 mg (01/04/2019), 420 mg (01/25/2019)  for chemotherapy treatment.    02/06/2019 Imaging   CT CAP: IMPRESSION: 1. There is mixed response to treatment, with decreased size of primary left breast mass and pulmonary nodules, however with enlarging lymphadenopathy and new, extensive sclerotic osseous metastatic disease.   2. Primary mass of the posterior left breast, involving the pectoralis major, has significantly decreased in size compared to prior examination, now measuring approximately 3.0 cm, previously at least 6.0 cm (series 2, image 14). There is unchanged, severe skin thickening of the left breast.   3. Numerous bilateral pulmonary masses and nodules have generally decreased in size, an index nodule of the right lower lobe measuring 2.0 cm,  previously 3.1 cm on measured similarly (series 3, image 66). There has been interval increase in volume of bilateral pleural effusions, with extensive consolidation and volume loss of the left lung, with extensive nodular pleural disease. 4. However, bulky axillary lymph nodes have increased in size compared to prior examination, the largest in the right axilla 4.3 x 2.7 cm, previously 3.3 x 1.5 cm when measured similarly (series 2, image 11). Interval enlargement of supraclavicular and mediastinal lymph nodes, the largest supraclavicular nodes on the left  measuring 3.0 x 1.5 cm, previously 2.4 x 1.1 cm (series 2, image 5). Newly enlarged epicardial lymph nodes measuring up to 1.4 x 1.0 cm (series 2, image 37). There are newly enlarged left retroperitoneal lymph nodes measuring up to 1.4 x 1.0 cm (series 2, image 58).   5. There are innumerable new sclerotic osseous metastatic lesions involving the included axial and appendicular skeleton. In retrospect, some of these lesions were likely subtly present on prior CT dated 10/19/2018 and to some extent this change reflects post treatment appearance of metastatic lesions.   02/15/2019 -  Chemotherapy   The patient had ado-trastuzumab emtansine (KADCYLA) 360 mg in sodium chloride 0.9 % 250 mL chemo infusion, 3.6 mg/kg = 360 mg, Intravenous, Once, 1 of 5 cycles  for chemotherapy treatment.      REVIEW OF SYSTEMS:   Constitutional: ( - ) fevers, ( - )  chills , ( - ) night sweats Eyes: ( - ) blurriness of vision, ( - ) double vision, ( - ) watery eyes Ears, nose, mouth, throat, and face: ( - ) mucositis, ( - ) sore throat Respiratory: ( + ) cough, ( + ) dyspnea, ( - ) wheezes Cardiovascular: ( - ) palpitation, ( - ) chest discomfort, ( - ) lower extremity swelling Gastrointestinal:  ( - ) nausea, ( - ) heartburn, ( - ) change in bowel habits Skin: ( - ) abnormal skin rashes Lymphatics: ( + ) new lymphadenopathy, ( - ) easy bruising Neurological: ( - ) numbness, ( - ) tingling, ( - ) new weaknesses Behavioral/Psych: ( - ) mood change, ( - ) new changes  All other systems were reviewed with the patient and are negative.  I have reviewed the past medical history, past surgical history, social history and family history with the patient and they are unchanged from previous note.  ALLERGIES:  is allergic to aspirin.  MEDICATIONS:  Current Outpatient Medications  Medication Sig Dispense Refill  . ado-trastuzumab emtansine 360 mg in sodium chloride 0.9 % 250 mL Inject 360 mg into the vein every  21 ( twenty-one) days for 12 doses. 12 Doses/Fill 0  . dexamethasone (DECADRON) 4 MG tablet Take 2 tablets (8 mg total) by mouth 2 (two) times daily. Start the day before Taxotere. Then daily after chemo for 2 days. 30 tablet 4  . gabapentin (NEURONTIN) 300 MG capsule TAKE 2 CAPSULES (600 MG TOTAL) BY MOUTH 2 (TWO) TIMES DAILY. 360 capsule 2  . lidocaine-prilocaine (EMLA) cream Apply to affected area once 30 g 3  . LORazepam (ATIVAN) 0.5 MG tablet Take 1 tablet (0.5 mg total) by mouth every 6 (six) hours as needed (Nausea or vomiting). 30 tablet 0  . morphine (MS CONTIN) 30 MG 12 hr tablet Take 1 tablet (30 mg total) by mouth every 12 (twelve) hours. 60 tablet 0  . ondansetron (ZOFRAN) 8 MG tablet Take 1 tablet (8 mg total) by mouth 2 (two) times daily as needed for  refractory nausea / vomiting. 30 tablet 4  . oxyCODONE (OXY IR/ROXICODONE) 5 MG immediate release tablet Take 2 tablets (10 mg total) by mouth every 6 (six) hours as needed for severe pain. 240 tablet 0  . prochlorperazine (COMPAZINE) 10 MG tablet Take 1 tablet (10 mg total) by mouth every 6 (six) hours as needed (Nausea or vomiting). 30 tablet 4  . UNABLE TO FIND As per Medical necessity, Mastectomy Bra Q#2-Dx Code C50.812, Z17.1 Malignant neoplasm of overlapping sites of left breast in female, estrogen receptor negative 2 each 0   No current facility-administered medications for this visit.    Facility-Administered Medications Ordered in Other Visits  Medication Dose Route Frequency Provider Last Rate Last Dose  . ado-trastuzumab emtansine (KADCYLA) 320 mg in sodium chloride 0.9 % 250 mL chemo infusion  320 mg Intravenous Once Tish Men, MD      . heparin lock flush 100 unit/mL  500 Units Intracatheter Once PRN Tish Men, MD      . sodium chloride flush (NS) 0.9 % injection 10 mL  10 mL Intracatheter PRN Tish Men, MD        PHYSICAL EXAMINATION: ECOG PERFORMANCE STATUS: 1 - Symptomatic but completely ambulatory  Today's Vitals    02/15/19 1019 02/15/19 1030  BP: 132/89   Pulse: (!) 115   Resp: (!) 21   Temp: 97.8 F (36.6 C)   TempSrc: Oral   SpO2: 99%   PainSc:  0-No pain   There is no height or weight on file to calculate BMI.  There were no vitals filed for this visit.  GENERAL: alert, no distress, mildly dyspneic with speaking SKIN: skin color, texture, turgor are normal, no rashes or significant lesions EYES: conjunctiva are pink and non-injected, sclera clear OROPHARYNX: no exudate, no erythema; lips, buccal mucosa, and tongue normal  NECK: supple, non-tender LUNGS: crackles in bilateral lung bases, L > R, no wheezing HEART: regular rate & rhythm and no murmurs and no lower extremity edema ABDOMEN: soft, non-tender, non-distended, normal bowel sounds Musculoskeletal: no cyanosis of digits and no clubbing  PSYCH: alert & oriented x 3, fluent speech NEURO: no focal motor/sensory deficits  LABORATORY DATA:  I have reviewed the data as listed    Component Value Date/Time   NA 140 02/15/2019 1000   K 4.6 02/15/2019 1000   CL 101 02/15/2019 1000   CO2 31 02/15/2019 1000   GLUCOSE 116 (H) 02/15/2019 1000   BUN 11 02/15/2019 1000   CREATININE 0.65 02/15/2019 1000   CALCIUM 9.5 02/15/2019 1000   PROT 7.2 02/15/2019 1000   ALBUMIN 3.7 02/15/2019 1000   AST 13 (L) 02/15/2019 1000   ALT 10 02/15/2019 1000   ALKPHOS 74 02/15/2019 1000   BILITOT 0.4 02/15/2019 1000   GFRNONAA >60 02/15/2019 1000   GFRAA >60 02/15/2019 1000    No results found for: SPEP, UPEP  Lab Results  Component Value Date   WBC 14.3 (H) 02/15/2019   NEUTROABS 13.0 (H) 02/15/2019   HGB 8.9 (L) 02/15/2019   HCT 30.5 (L) 02/15/2019   MCV 87.1 02/15/2019   PLT 530 (H) 02/15/2019      Chemistry      Component Value Date/Time   NA 140 02/15/2019 1000   K 4.6 02/15/2019 1000   CL 101 02/15/2019 1000   CO2 31 02/15/2019 1000   BUN 11 02/15/2019 1000   CREATININE 0.65 02/15/2019 1000      Component Value Date/Time    CALCIUM 9.5  02/15/2019 1000   ALKPHOS 74 02/15/2019 1000   AST 13 (L) 02/15/2019 1000   ALT 10 02/15/2019 1000   BILITOT 0.4 02/15/2019 1000       RADIOGRAPHIC STUDIES: I have personally reviewed the radiological images as listed below and agreed with the findings in the report. Ct Chest W Contrast  Result Date: 02/06/2019 CLINICAL DATA:  Follow-up metastatic breast cancer EXAM: CT CHEST, ABDOMEN, AND PELVIS WITH CONTRAST TECHNIQUE: Multidetector CT imaging of the chest, abdomen and pelvis was performed following the standard protocol during bolus administration of intravenous contrast. CONTRAST:  12m OMNIPAQUE IOHEXOL 300 MG/ML SOLN, additional oral enteric contrast COMPARISON:  CT pulmonary angiogram, 12/07/2018, CT chest abdomen pelvis, 10/19/2018, CT pulmonary angiogram, 10/17/2018, CT chest, 09/30/2018 FINDINGS: CT CHEST FINDINGS Cardiovascular: Right internal jugular port catheter. Normal heart size. No pericardial effusion. Mediastinum/Nodes: Bulky axillary lymph nodes have increased in size compared to prior examination, the largest in the right axilla 4.3 x 2.7 cm, previously 3.3 x 1.5 cm when measured similarly (series 2, image 11). Interval enlargement of supraclavicular and mediastinal lymph nodes, the largest supraclavicular nodes on the left measuring 3.0 x 1.5 cm, previously 2.4 x 1.1 cm (series 2, image 5). Newly enlarged epicardial lymph nodes measuring up to 1.4 x 1.0 cm (series 2, image 37). Thyroid gland, trachea, and esophagus demonstrate no significant findings. Lungs/Pleura: Numerous bilateral pulmonary masses and nodules have generally decreased in size, an index nodule of the right lower lobe measuring 2.0 cm, previously 3.1 cm on measured similarly (series 3, image 66). There has been interval increase in volume of bilateral pleural effusions, with extensive consolidation and volume loss of the left lung, with extensive nodular pleural disease. There has been interval  development of early radiation fibrosis of the left lung anteriorly. A previously seen left-sided chest tube has been removed. Musculoskeletal: Primary mass of the posterior left breast, involving the pectoralis major, has significantly decreased in size compared to prior examination, now measuring approximately 3.0 cm, previously at least 6.0 cm (series 2, image 14). There is unchanged, severe skin thickening of the left breast. CT ABDOMEN PELVIS FINDINGS Hepatobiliary: No solid liver abnormality is seen. No gallstones, gallbladder wall thickening, or biliary dilatation. Pancreas: Unremarkable. No pancreatic ductal dilatation or surrounding inflammatory changes. Spleen: Normal in size without significant abnormality. Adrenals/Urinary Tract: Adrenal glands are unremarkable. Kidneys are normal, without renal calculi, solid lesion, or hydronephrosis. Bladder is unremarkable. Stomach/Bowel: Stomach is within normal limits. Appendix appears normal. No evidence of bowel wall thickening, distention, or inflammatory changes. Occasional sigmoid diverticula. Vascular/Lymphatic: No significant vascular findings are present. There are newly enlarged left retroperitoneal lymph nodes measuring up to 1.4 x 1.0 cm (series 2, image 58). Reproductive: No mass or other abnormality. Other: No abdominal wall hernia or abnormality. No abdominopelvic ascites. Musculoskeletal: Innumerable new sclerotic osseous metastatic lesions involving the included axial and appendicular skeleton. In retrospect, some of these lesions were likely subtly present on prior CT dated 10/19/2018 (e.g. In the left ilium prior examination series 2, image 62). IMPRESSION: 1. There is mixed response to treatment, with decreased size of primary left breast mass and pulmonary nodules, however with enlarging lymphadenopathy and new, extensive sclerotic osseous metastatic disease. 2. Primary mass of the posterior left breast, involving the pectoralis major, has  significantly decreased in size compared to prior examination, now measuring approximately 3.0 cm, previously at least 6.0 cm (series 2, image 14). There is unchanged, severe skin thickening of the left breast. 3. Numerous bilateral pulmonary  masses and nodules have generally decreased in size, an index nodule of the right lower lobe measuring 2.0 cm, previously 3.1 cm on measured similarly (series 3, image 66). There has been interval increase in volume of bilateral pleural effusions, with extensive consolidation and volume loss of the left lung, with extensive nodular pleural disease. 4. However, bulky axillary lymph nodes have increased in size compared to prior examination, the largest in the right axilla 4.3 x 2.7 cm, previously 3.3 x 1.5 cm when measured similarly (series 2, image 11). Interval enlargement of supraclavicular and mediastinal lymph nodes, the largest supraclavicular nodes on the left measuring 3.0 x 1.5 cm, previously 2.4 x 1.1 cm (series 2, image 5). Newly enlarged epicardial lymph nodes measuring up to 1.4 x 1.0 cm (series 2, image 37). There are newly enlarged left retroperitoneal lymph nodes measuring up to 1.4 x 1.0 cm (series 2, image 58). 5. There are innumerable new sclerotic osseous metastatic lesions involving the included axial and appendicular skeleton. In retrospect, some of these lesions were likely subtly present on prior CT dated 10/19/2018 and to some extent this change reflects post treatment appearance of metastatic lesions. Electronically Signed   By: Eddie Candle M.D.   On: 02/06/2019 16:04   Ct Abdomen Pelvis W Contrast  Result Date: 02/06/2019 CLINICAL DATA:  Follow-up metastatic breast cancer EXAM: CT CHEST, ABDOMEN, AND PELVIS WITH CONTRAST TECHNIQUE: Multidetector CT imaging of the chest, abdomen and pelvis was performed following the standard protocol during bolus administration of intravenous contrast. CONTRAST:  160m OMNIPAQUE IOHEXOL 300 MG/ML SOLN, additional  oral enteric contrast COMPARISON:  CT pulmonary angiogram, 12/07/2018, CT chest abdomen pelvis, 10/19/2018, CT pulmonary angiogram, 10/17/2018, CT chest, 09/30/2018 FINDINGS: CT CHEST FINDINGS Cardiovascular: Right internal jugular port catheter. Normal heart size. No pericardial effusion. Mediastinum/Nodes: Bulky axillary lymph nodes have increased in size compared to prior examination, the largest in the right axilla 4.3 x 2.7 cm, previously 3.3 x 1.5 cm when measured similarly (series 2, image 11). Interval enlargement of supraclavicular and mediastinal lymph nodes, the largest supraclavicular nodes on the left measuring 3.0 x 1.5 cm, previously 2.4 x 1.1 cm (series 2, image 5). Newly enlarged epicardial lymph nodes measuring up to 1.4 x 1.0 cm (series 2, image 37). Thyroid gland, trachea, and esophagus demonstrate no significant findings. Lungs/Pleura: Numerous bilateral pulmonary masses and nodules have generally decreased in size, an index nodule of the right lower lobe measuring 2.0 cm, previously 3.1 cm on measured similarly (series 3, image 66). There has been interval increase in volume of bilateral pleural effusions, with extensive consolidation and volume loss of the left lung, with extensive nodular pleural disease. 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 A previously seen left-sided chest tube has been removed. Musculoskeletal: Primary mass of the posterior left breast, involving the pectoralis major, has significantly decreased in size compared to prior examination, now measuring approximately 3.0 cm, previously at least 6.0 cm (series 2, image 14). There is unchanged, severe skin thickening of the left breast. CT ABDOMEN PELVIS FINDINGS Hepatobiliary: No solid liver abnormality is seen. No gallstones, gallbladder wall thickening, or biliary dilatation. Pancreas: Unremarkable. No pancreatic ductal dilatation or surrounding inflammatory changes.  Spleen: Normal in size without significant abnormality. Adrenals/Urinary Tract: Adrenal glands are unremarkable. Kidneys are normal, without renal calculi, solid lesion, or hydronephrosis. Bladder is unremarkable. Stomach/Bowel: Stomach is within normal limits. Appendix appears normal. No evidence of bowel wall thickening, distention, or inflammatory changes. Occasional  sigmoid diverticula. Vascular/Lymphatic: No significant vascular findings are present. There are newly enlarged left retroperitoneal lymph nodes measuring up to 1.4 x 1.0 cm (series 2, image 58). Reproductive: No mass or other abnormality. Other: No abdominal wall hernia or abnormality. No abdominopelvic ascites. Musculoskeletal: Innumerable new sclerotic osseous metastatic lesions involving the included axial and appendicular skeleton. In retrospect, some of these lesions were likely subtly present on prior CT dated 10/19/2018 (e.g. In the left ilium prior examination series 2, image 62). IMPRESSION: 1. There is mixed response to treatment, with decreased size of primary left breast mass and pulmonary nodules, however with enlarging lymphadenopathy and new, extensive sclerotic osseous metastatic disease. 2. Primary mass of the posterior left breast, involving the pectoralis major, has significantly decreased in size compared to prior examination, now measuring approximately 3.0 cm, previously at least 6.0 cm (series 2, image 14). There is unchanged, severe skin thickening of the left breast. 3. Numerous bilateral pulmonary masses and nodules have generally decreased in size, an index nodule of the right lower lobe measuring 2.0 cm, previously 3.1 cm on measured similarly (series 3, image 66). There has been interval increase in volume of bilateral pleural effusions, with extensive consolidation and volume loss of the left lung, with extensive nodular pleural disease. 4. However, bulky axillary lymph nodes have increased in size compared to prior  examination, the largest in the right axilla 4.3 x 2.7 cm, previously 3.3 x 1.5 cm when measured similarly (series 2, image 11). Interval enlargement of supraclavicular and mediastinal lymph nodes, the largest supraclavicular nodes on the left measuring 3.0 x 1.5 cm, previously 2.4 x 1.1 cm (series 2, image 5). Newly enlarged epicardial lymph nodes measuring up to 1.4 x 1.0 cm (series 2, image 37). There are newly enlarged left retroperitoneal lymph nodes measuring up to 1.4 x 1.0 cm (series 2, image 58). 5. There are innumerable new sclerotic osseous metastatic lesions involving the included axial and appendicular skeleton. In retrospect, some of these lesions were likely subtly present on prior CT dated 10/19/2018 and to some extent this change reflects post treatment appearance of metastatic lesions. Electronically Signed   By: Eddie Candle M.D.   On: 02/06/2019 16:04

## 2019-02-15 ENCOUNTER — Inpatient Hospital Stay: Payer: BC Managed Care – PPO

## 2019-02-15 ENCOUNTER — Other Ambulatory Visit: Payer: Self-pay

## 2019-02-15 ENCOUNTER — Telehealth: Payer: Self-pay | Admitting: Hematology

## 2019-02-15 ENCOUNTER — Encounter: Payer: Self-pay | Admitting: *Deleted

## 2019-02-15 ENCOUNTER — Encounter: Payer: Self-pay | Admitting: Hematology

## 2019-02-15 ENCOUNTER — Inpatient Hospital Stay: Payer: BC Managed Care – PPO | Attending: Hematology

## 2019-02-15 ENCOUNTER — Ambulatory Visit (HOSPITAL_COMMUNITY): Admission: RE | Admit: 2019-02-15 | Payer: BC Managed Care – PPO | Source: Ambulatory Visit

## 2019-02-15 ENCOUNTER — Other Ambulatory Visit: Payer: Self-pay | Admitting: Radiology

## 2019-02-15 ENCOUNTER — Inpatient Hospital Stay (HOSPITAL_BASED_OUTPATIENT_CLINIC_OR_DEPARTMENT_OTHER): Payer: BC Managed Care – PPO | Admitting: Hematology

## 2019-02-15 VITALS — BP 132/89 | HR 115 | Temp 97.8°F | Resp 21

## 2019-02-15 DIAGNOSIS — Z79899 Other long term (current) drug therapy: Secondary | ICD-10-CM | POA: Insufficient documentation

## 2019-02-15 DIAGNOSIS — T451X5A Adverse effect of antineoplastic and immunosuppressive drugs, initial encounter: Secondary | ICD-10-CM

## 2019-02-15 DIAGNOSIS — Z5112 Encounter for antineoplastic immunotherapy: Secondary | ICD-10-CM | POA: Diagnosis not present

## 2019-02-15 DIAGNOSIS — D72829 Elevated white blood cell count, unspecified: Secondary | ICD-10-CM

## 2019-02-15 DIAGNOSIS — G893 Neoplasm related pain (acute) (chronic): Secondary | ICD-10-CM | POA: Diagnosis not present

## 2019-02-15 DIAGNOSIS — D6481 Anemia due to antineoplastic chemotherapy: Secondary | ICD-10-CM | POA: Insufficient documentation

## 2019-02-15 DIAGNOSIS — R590 Localized enlarged lymph nodes: Secondary | ICD-10-CM

## 2019-02-15 DIAGNOSIS — J910 Malignant pleural effusion: Secondary | ICD-10-CM | POA: Insufficient documentation

## 2019-02-15 DIAGNOSIS — C50812 Malignant neoplasm of overlapping sites of left female breast: Secondary | ICD-10-CM

## 2019-02-15 DIAGNOSIS — Z171 Estrogen receptor negative status [ER-]: Secondary | ICD-10-CM | POA: Diagnosis not present

## 2019-02-15 DIAGNOSIS — C7951 Secondary malignant neoplasm of bone: Secondary | ICD-10-CM

## 2019-02-15 DIAGNOSIS — C7801 Secondary malignant neoplasm of right lung: Secondary | ICD-10-CM

## 2019-02-15 DIAGNOSIS — C7931 Secondary malignant neoplasm of brain: Secondary | ICD-10-CM

## 2019-02-15 DIAGNOSIS — D649 Anemia, unspecified: Secondary | ICD-10-CM

## 2019-02-15 DIAGNOSIS — C7802 Secondary malignant neoplasm of left lung: Secondary | ICD-10-CM | POA: Insufficient documentation

## 2019-02-15 DIAGNOSIS — D473 Essential (hemorrhagic) thrombocythemia: Secondary | ICD-10-CM

## 2019-02-15 DIAGNOSIS — D75839 Thrombocytosis, unspecified: Secondary | ICD-10-CM

## 2019-02-15 LAB — CMP (CANCER CENTER ONLY)
ALT: 10 U/L (ref 0–44)
AST: 13 U/L — ABNORMAL LOW (ref 15–41)
Albumin: 3.7 g/dL (ref 3.5–5.0)
Alkaline Phosphatase: 74 U/L (ref 38–126)
Anion gap: 8 (ref 5–15)
BUN: 11 mg/dL (ref 6–20)
CO2: 31 mmol/L (ref 22–32)
Calcium: 9.5 mg/dL (ref 8.9–10.3)
Chloride: 101 mmol/L (ref 98–111)
Creatinine: 0.65 mg/dL (ref 0.44–1.00)
GFR, Est AFR Am: >60 mL/min (ref >60)
GFR, Estimated: >60 mL/min (ref >60)
Glucose, Bld: 116 mg/dL — ABNORMAL HIGH (ref 70–99)
Potassium: 4.6 mmol/L (ref 3.5–5.1)
Sodium: 140 mmol/L (ref 135–145)
Total Bilirubin: 0.4 mg/dL (ref 0.3–1.2)
Total Protein: 7.2 g/dL (ref 6.5–8.1)

## 2019-02-15 LAB — CBC WITH DIFFERENTIAL (CANCER CENTER ONLY)
Abs Immature Granulocytes: 0.38 10*3/uL — ABNORMAL HIGH (ref 0.00–0.07)
Basophils Absolute: 0 10*3/uL (ref 0.0–0.1)
Basophils Relative: 0 %
Eosinophils Absolute: 0 10*3/uL (ref 0.0–0.5)
Eosinophils Relative: 0 %
HCT: 30.5 % — ABNORMAL LOW (ref 36.0–46.0)
Hemoglobin: 8.9 g/dL — ABNORMAL LOW (ref 12.0–15.0)
Immature Granulocytes: 3 %
Lymphocytes Relative: 3 %
Lymphs Abs: 0.5 10*3/uL — ABNORMAL LOW (ref 0.7–4.0)
MCH: 25.4 pg — ABNORMAL LOW (ref 26.0–34.0)
MCHC: 29.2 g/dL — ABNORMAL LOW (ref 30.0–36.0)
MCV: 87.1 fL (ref 80.0–100.0)
Monocytes Absolute: 0.4 10*3/uL (ref 0.1–1.0)
Monocytes Relative: 3 %
Neutro Abs: 13 10*3/uL — ABNORMAL HIGH (ref 1.7–7.7)
Neutrophils Relative %: 91 %
Platelet Count: 530 10*3/uL — ABNORMAL HIGH (ref 150–400)
RBC: 3.5 MIL/uL — ABNORMAL LOW (ref 3.87–5.11)
RDW: 20.6 % — ABNORMAL HIGH (ref 11.5–15.5)
WBC Count: 14.3 10*3/uL — ABNORMAL HIGH (ref 4.0–10.5)
nRBC: 0 % (ref 0.0–0.2)

## 2019-02-15 LAB — MAGNESIUM: Magnesium: 2.1 mg/dL (ref 1.7–2.4)

## 2019-02-15 MED ORDER — DIPHENHYDRAMINE HCL 25 MG PO CAPS
ORAL_CAPSULE | ORAL | Status: AC
  Filled 2019-02-15: qty 2

## 2019-02-15 MED ORDER — SODIUM CHLORIDE 0.9 % IV SOLN
Freq: Once | INTRAVENOUS | Status: AC
  Administered 2019-02-15: 11:00:00 via INTRAVENOUS
  Filled 2019-02-15: qty 250

## 2019-02-15 MED ORDER — ACETAMINOPHEN 325 MG PO TABS
ORAL_TABLET | ORAL | Status: AC
  Filled 2019-02-15: qty 2

## 2019-02-15 MED ORDER — SODIUM CHLORIDE 0.9% FLUSH
10.0000 mL | INTRAVENOUS | Status: DC | PRN
  Administered 2019-02-15: 15:00:00 10 mL
  Filled 2019-02-15: qty 10

## 2019-02-15 MED ORDER — UNABLE TO FIND: 0 refills | Status: AC

## 2019-02-15 MED ORDER — HEPARIN SOD (PORK) LOCK FLUSH 100 UNIT/ML IV SOLN
500.0000 [IU] | Freq: Once | INTRAVENOUS | Status: AC | PRN
  Administered 2019-02-15: 15:00:00 500 [IU]
  Filled 2019-02-15: qty 5

## 2019-02-15 MED ORDER — ACETAMINOPHEN 325 MG PO TABS
650.0000 mg | ORAL_TABLET | Freq: Once | ORAL | Status: AC
  Administered 2019-02-15: 650 mg via ORAL

## 2019-02-15 MED ORDER — SODIUM CHLORIDE 0.9 % IV SOLN
320.0000 mg | Freq: Once | INTRAVENOUS | Status: AC
  Administered 2019-02-15: 320 mg via INTRAVENOUS
  Filled 2019-02-15: qty 16

## 2019-02-15 MED ORDER — DIPHENHYDRAMINE HCL 25 MG PO CAPS
50.0000 mg | ORAL_CAPSULE | Freq: Once | ORAL | Status: AC
  Administered 2019-02-15: 50 mg via ORAL

## 2019-02-15 NOTE — Telephone Encounter (Signed)
NO change in appts per 7/9 los

## 2019-02-15 NOTE — Patient Instructions (Signed)
[facility] Discharge Instructions for Patients Receiving Chemotherapy  Today you received the following chemotherapy agents Kadcyla To help prevent nausea and vomiting after your treatment, we encourage you to take your nausea medication as prescribed.  If you develop nausea and vomiting that is not controlled by your nausea medication, call the clinic.   BELOW ARE SYMPTOMS THAT SHOULD BE REPORTED IMMEDIATELY:  *FEVER GREATER THAN 100.5 F  *CHILLS WITH OR WITHOUT FEVER  NAUSEA AND VOMITING THAT IS NOT CONTROLLED WITH YOUR NAUSEA MEDICATION  *UNUSUAL SHORTNESS OF BREATH  *UNUSUAL BRUISING OR BLEEDING  TENDERNESS IN MOUTH AND THROAT WITH OR WITHOUT PRESENCE OF ULCERS  *URINARY PROBLEMS  *BOWEL PROBLEMS  UNUSUAL RASH Items with * indicate a potential emergency and should be followed up as soon as possible.  Feel free to call the clinic should you have any questions or concerns. The clinic phone number is [phone].  Please show the [facility] at check-in to the Emergency Department and triage nurse.  Ado-Trastuzumab Emtansine for injection (Kadcyla) What is this medicine? ADO-TRASTUZUMAB EMTANSINE (ADD oh traz TOO zuh mab em TAN zine) is a monoclonal antibody combined with chemotherapy. It is used to treat breast cancer. This medicine may be used for other purposes; ask your health care provider or pharmacist if you have questions. COMMON BRAND NAME(S): Kadcyla What should I tell my health care provider before I take this medicine? They need to know if you have any of these conditions:  heart disease  heart failure  infection (especially a virus infection such as chickenpox, cold sores, or herpes)  liver disease  lung or breathing disease, like asthma  tingling of the fingers or toes, or other nerve disorder  an unusual or allergic reaction to ado-trastuzumab emtansine, other medications, foods, dyes, or preservatives  pregnant or trying to  get pregnant  breast-feeding How should I use this medicine? This medicine is for infusion into a vein. It is given by a health care professional in a hospital or clinic setting. Talk to your pediatrician regarding the use of this medicine in children. Special care may be needed. Overdosage: If you think you have taken too much of this medicine contact a poison control center or emergency room at once. NOTE: This medicine is only for you. Do not share this medicine with others. What if I miss a dose? It is important not to miss your dose. Call your doctor or health care professional if you are unable to keep an appointment. What may interact with this medicine? This medicine may also interact with the following medications:  atazanavir  boceprevir  clarithromycin  delavirdine  indinavir  dalfopristin; quinupristin  isoniazid, INH  itraconazole  ketoconazole  nefazodone  nelfinavir  ritonavir  telaprevir  telithromycin  tipranavir  voriconazole This list may not describe all possible interactions. Give your health care provider a list of all the medicines, herbs, non-prescription drugs, or dietary supplements you use. Also tell them if you smoke, drink alcohol, or use illegal drugs. Some items may interact with your medicine. What should I watch for while using this medicine? Visit your doctor for checks on your progress. This drug may make you feel generally unwell. This is not uncommon, as chemotherapy can affect healthy cells as well as cancer cells. Report any side effects. Continue your course of treatment even though you feel ill unless your doctor tells you to stop. You may need blood work done while you are taking this medicine. Call  your doctor or health care professional for advice if you get a fever, chills or sore throat, or other symptoms of a cold or flu. Do not treat yourself. This drug decreases your body's ability to fight infections. Try to avoid being  around people who are sick. Be careful brushing and flossing your teeth or using a toothpick because you may get an infection or bleed more easily. If you have any dental work done, tell your dentist you are receiving this medicine. Avoid taking products that contain aspirin, acetaminophen, ibuprofen, naproxen, or ketoprofen unless instructed by your doctor. These medicines may hide a fever. Do not become pregnant while taking this medicine or for 7 months after stopping it, men with female partners should use contraception during treatment and for 4 months after the last dose. Women should inform their doctor if they wish to become pregnant or think they might be pregnant. There is a potential for serious side effects to an unborn child. Do not breast-feed an infant while taking this medicine or for 7 months after the last dose. Men who have a partner who is pregnant or who is capable of becoming pregnant should use a condom during sexual activity while taking this medicine and for 4 months after stopping it. Men should inform their doctors if they wish to father a child. This medicine may lower sperm counts. Talk to your health care professional or pharmacist for more information. What side effects may I notice from receiving this medicine? Side effects that you should report to your doctor or health care professional as soon as possible:  allergic reactions like skin rash, itching or hives, swelling of the face, lips, or tongue  breathing problems  chest pain or palpitations  fever or chills, sore throat  general ill feeling or flu-like symptoms  light-colored stools  nausea, vomiting  pain, tingling, numbness in the hands or feet  signs and symptoms of bleeding such as bloody or black, tarry stools; red or dark-brown urine; spitting up blood or brown material that looks like coffee grounds; red spots on the skin; unusual bruising or bleeding from the eye, gums, or nose  swelling of the  legs or ankles  yellowing of the eyes or skin Side effects that usually do not require medical attention (report to your doctor or health care professional if they continue or are bothersome):  changes in taste  constipation  dizziness  headache  joint pain  muscle pain  trouble sleeping  unusually weak or tired This list may not describe all possible side effects. Call your doctor for medical advice about side effects. You may report side effects to FDA at 1-800-FDA-1088. Where should I keep my medicine? This drug is given in a hospital or clinic and will not be stored at home. NOTE: This sheet is a summary. It may not cover all possible information. If you have questions about this medicine, talk to your doctor, pharmacist, or health care provider.  2020 Elsevier/Gold Standard (2017-12-23 10:03:15)

## 2019-02-15 NOTE — Progress Notes (Signed)
Patient's pleurex catheter needs reassessment. It has fallen out of place and pleural effusions are worsening. Called and left a message on IR at [facility] to schedule patient for this week.  Scheduler was able to make contact with [facility] IR and schedule patient for this afternoon.

## 2019-02-15 NOTE — Patient Instructions (Signed)
Tunneled Central Venous Catheter Flushing Guide  It is important to flush your tunneled central venous catheter each time you use it, both before and after you use it. Flushing your catheter will help prevent it from clogging. What are the risks? Risks may include:  Infection.  Air getting into the catheter and bloodstream. Supplies needed:  A clean pair of gloves.  A disinfecting wipe. Use an alcohol wipe, chlorhexidine wipe, or iodine wipe as told by your health care provider.  A 10 mL syringe that has been prefilled with saline solution.  An empty 10 mL syringe, if a substance called heparin was injected into your catheter. How to flush your catheter When you flush your catheter, make sure you follow any specific instructions from your health care provider or the manufacturer. These are general guidelines. Flushing your catheter before use If there is heparin in your catheter: 1. Wash your hands with soap and water. 2. Put on gloves. 3. Scrub the injection cap for a minimum of 15 seconds with a disinfecting wipe. 4. Unclamp the catheter. 5. Attach the empty syringe to the injection cap. 6. Pull the syringe plunger back and withdraw 10 mL of blood. 7. Place the syringe into an appropriate waste container. 8. Scrub the injection cap for 15 seconds with a disinfecting wipe. 9. Attach the prefilled syringe to the injection cap. 10. Flush the catheter by pushing the plunger forward until all the liquid from the syringe is in the catheter. 11. Remove the syringe from the injection cap. 12. Clamp the catheter. If there is no heparin in your catheter: 1. Wash your hands with soap and water. 2. Put on gloves. 3. Scrub the injection cap for 15 seconds with a disinfecting wipe. 4. Unclamp the catheter. 5. Attach the prefilled syringe to the injection cap. 6. Flush the catheter by pushing the plunger forward until 5 mL of the liquid from the syringe is in the catheter. 7. Pull back on  the syringe until you see blood in the catheter. 8. If you have been asked to collect any blood, follow your health care provider's instructions. Otherwise, flush the catheter with the rest of the solution from the syringe. 9. Remove the syringe from the injection cap. 10. Clamp the catheter.  Flushing your catheter after use 1. Wash your hands with soap and water. 2. Put on gloves. 3. Scrub the injection cap for 15 seconds with a disinfecting wipe. 4. Unclamp the catheter. 5. Attach the prefilled syringe to the injection cap. 6. Flush the catheter by pushing the plunger forward until all of the liquid from the syringe is in the catheter. 7. Remove the syringe from the injection cap. 8. Clamp the catheter. Problems and solutions  If blood cannot be completely cleared from the injection cap, you may need to have the injection cap replaced.  If the catheter is difficult to flush, use the pulsing method. The pulsing method involves pushing only a few milliliters of solution into the catheter at a time and pausing between pushes.  If you do not see blood in the catheter when you pull back on the syringe, change your body position, such as by raising your arms above your head. Take a deep breath and cough. Then, pull back on the syringe. If you still do not see blood, flush the catheter with a small amount of solution. Then, change positions again and take a breath or cough. Pull back on the syringe again. If you still do not see   blood, finish flushing the catheter and contact your health care provider. Do not use your catheter until your health care provider says it is okay. General tips  Have someone help you flush your catheter, if possible.  Do not force fluid through your catheter.  Do not use a syringe that is larger or smaller than 10 mL. Using a smaller syringe can make the catheter burst.  Do not use your catheter without flushing it first if it has heparin in it. Contact a health  care provider if:  You cannot see any blood in the catheter when you flush it before using it.  Your catheter is difficult to flush. Get help right away if:  You cannot flush the catheter.  The catheter leaks when you flush it or when there is fluid in it.  There are cracks or breaks in the catheter. Summary  It is important to flush your tunneled central venous catheter each time you use it, both before and after you use it.  Scrub the injection cap for 15 seconds with a disinfecting wipe before and after you flush it.  When you flush your catheter, make sure you follow any specific instructions from your health care provider or the manufacturer.  Get help right away if you cannot flush the catheter. This information is not intended to replace advice given to you by your health care provider. Make sure you discuss any questions you have with your health care provider. Document Released: 07/15/2011 Document Revised: 10/11/2018 Document Reviewed: 10/11/2018 Elsevier Patient Education  2020 Elsevier Inc.  

## 2019-02-16 ENCOUNTER — Ambulatory Visit (HOSPITAL_BASED_OUTPATIENT_CLINIC_OR_DEPARTMENT_OTHER)
Admission: RE | Admit: 2019-02-16 | Discharge: 2019-02-16 | Disposition: A | Discharge status: Home / Self Care | Payer: BC Managed Care – PPO | Source: Ambulatory Visit | Attending: Hematology | Admitting: Hematology

## 2019-02-16 ENCOUNTER — Other Ambulatory Visit (HOSPITAL_COMMUNITY)
Admission: RE | Admit: 2019-02-16 | Discharge: 2019-02-16 | Disposition: A | Discharge status: Home / Self Care | Payer: BC Managed Care – PPO | Source: Ambulatory Visit | Attending: Interventional Radiology | Admitting: Interventional Radiology

## 2019-02-16 ENCOUNTER — Ambulatory Visit (HOSPITAL_COMMUNITY)
Admission: RE | Admit: 2019-02-16 | Discharge: 2019-02-16 | Disposition: A | Discharge status: Home / Self Care | Payer: BC Managed Care – PPO | Source: Ambulatory Visit | Attending: Hematology | Admitting: Hematology

## 2019-02-16 ENCOUNTER — Encounter (HOSPITAL_COMMUNITY): Payer: Self-pay | Admitting: Student

## 2019-02-16 DIAGNOSIS — C50812 Malignant neoplasm of overlapping sites of left female breast: Secondary | ICD-10-CM | POA: Insufficient documentation

## 2019-02-16 DIAGNOSIS — R0602 Shortness of breath: Secondary | ICD-10-CM | POA: Diagnosis not present

## 2019-02-16 DIAGNOSIS — Z171 Estrogen receptor negative status [ER-]: Secondary | ICD-10-CM

## 2019-02-16 DIAGNOSIS — Z452 Encounter for adjustment and management of vascular access device: Secondary | ICD-10-CM | POA: Insufficient documentation

## 2019-02-16 DIAGNOSIS — Z1159 Encounter for screening for other viral diseases: Secondary | ICD-10-CM | POA: Diagnosis not present

## 2019-02-16 DIAGNOSIS — Z9981 Dependence on supplemental oxygen: Secondary | ICD-10-CM | POA: Diagnosis not present

## 2019-02-16 DIAGNOSIS — J910 Malignant pleural effusion: Secondary | ICD-10-CM | POA: Insufficient documentation

## 2019-02-16 DIAGNOSIS — J90 Pleural effusion, not elsewhere classified: Secondary | ICD-10-CM | POA: Diagnosis not present

## 2019-02-16 HISTORY — PX: IR REMOVAL OF PLURAL CATH W/CUFF: IMG5346

## 2019-02-16 LAB — SARS CORONAVIRUS 2 (TAT 6-24 HRS): SARS Coronavirus 2: NEGATIVE

## 2019-02-16 MED ORDER — PERFLUTREN LIPID MICROSPHERE
1.0000 mL | INTRAVENOUS | Status: DC | PRN
  Administered 2019-02-16: 4 mL via INTRAVENOUS
  Filled 2019-02-16: qty 10

## 2019-02-16 MED ORDER — LIDOCAINE HCL 1 % IJ SOLN
INTRAMUSCULAR | Status: AC
  Filled 2019-02-16: qty 20

## 2019-02-16 MED ORDER — CHLORHEXIDINE GLUCONATE 4 % EX LIQD
CUTANEOUS | Status: AC
  Filled 2019-02-16: qty 15

## 2019-02-16 NOTE — Progress Notes (Addendum)
Patient presented to radiology today due to PleurX catheter not working.   Patient arrived to Radiology short of breath on home O2.  Patient states this has been her baseline for the past 3-4 weeks. She has not had any drainage from her PleurX catheter during this time period.   PA removed PleurX dressing to find the catheter out of the skin completely.  Tract intact.  No pus or erythema.  Dressing replaced.   Patient given options for undergoing thoracentesis which included: -COVID testing now, await results for thoracentesis this afternoon.  -COVID testing over the weekend, thora early next week.  -Future thora based on symptoms, would need COVID testing.   Patient states she would like to schedule a thoracentesis Monday morning. She is given an order for COVID testing today at [facility]. She is provided with directions.  Care instructions for Pleurx site care given.   Brynda Greathouse, MS RD PA-C 12:32 PM

## 2019-02-16 NOTE — Progress Notes (Signed)
  Echocardiogram 2D Echocardiogram has been performed.  Sharon Floyd 02/16/2019, 9:56 AM

## 2019-02-19 ENCOUNTER — Ambulatory Visit (HOSPITAL_COMMUNITY)
Admission: RE | Admit: 2019-02-19 | Discharge: 2019-02-19 | Disposition: A | Discharge status: Home / Self Care | Payer: BC Managed Care – PPO | Source: Ambulatory Visit | Attending: Hematology | Admitting: Hematology

## 2019-02-19 ENCOUNTER — Other Ambulatory Visit: Payer: Self-pay | Admitting: Hematology

## 2019-02-19 ENCOUNTER — Other Ambulatory Visit (HOSPITAL_COMMUNITY): Payer: Self-pay | Admitting: Radiology

## 2019-02-19 ENCOUNTER — Encounter (HOSPITAL_COMMUNITY): Payer: Self-pay | Admitting: Radiology

## 2019-02-19 ENCOUNTER — Other Ambulatory Visit: Payer: Self-pay

## 2019-02-19 ENCOUNTER — Ambulatory Visit (HOSPITAL_COMMUNITY)
Admission: RE | Admit: 2019-02-19 | Discharge: 2019-02-19 | Disposition: A | Discharge status: Home / Self Care | Payer: BC Managed Care – PPO | Source: Ambulatory Visit | Attending: Radiology | Admitting: Radiology

## 2019-02-19 ENCOUNTER — Encounter (HOSPITAL_BASED_OUTPATIENT_CLINIC_OR_DEPARTMENT_OTHER): Payer: BC Managed Care – PPO | Attending: Internal Medicine

## 2019-02-19 DIAGNOSIS — J90 Pleural effusion, not elsewhere classified: Secondary | ICD-10-CM | POA: Diagnosis not present

## 2019-02-19 DIAGNOSIS — C799 Secondary malignant neoplasm of unspecified site: Secondary | ICD-10-CM | POA: Diagnosis not present

## 2019-02-19 DIAGNOSIS — T2141XA Corrosion of unspecified degree of chest wall, initial encounter: Secondary | ICD-10-CM | POA: Diagnosis not present

## 2019-02-19 DIAGNOSIS — C50812 Malignant neoplasm of overlapping sites of left female breast: Secondary | ICD-10-CM

## 2019-02-19 DIAGNOSIS — Y842 Radiological procedure and radiotherapy as the cause of abnormal reaction of the patient, or of later complication, without mention of misadventure at the time of the procedure: Secondary | ICD-10-CM | POA: Diagnosis not present

## 2019-02-19 DIAGNOSIS — T22442A Corrosion of unspecified degree of left axilla, initial encounter: Secondary | ICD-10-CM | POA: Diagnosis not present

## 2019-02-19 DIAGNOSIS — Z9889 Other specified postprocedural states: Secondary | ICD-10-CM

## 2019-02-19 DIAGNOSIS — S21002A Unspecified open wound of left breast, initial encounter: Secondary | ICD-10-CM | POA: Diagnosis not present

## 2019-02-19 DIAGNOSIS — J910 Malignant pleural effusion: Secondary | ICD-10-CM

## 2019-02-19 DIAGNOSIS — C50912 Malignant neoplasm of unspecified site of left female breast: Secondary | ICD-10-CM | POA: Diagnosis not present

## 2019-02-19 DIAGNOSIS — J181 Lobar pneumonia, unspecified organism: Secondary | ICD-10-CM | POA: Diagnosis not present

## 2019-02-19 HISTORY — PX: IR THORACENTESIS ASP PLEURAL SPACE W/IMG GUIDE: IMG5380

## 2019-02-19 MED ORDER — LIDOCAINE HCL (PF) 1 % IJ SOLN
INTRAMUSCULAR | Status: DC | PRN
  Administered 2019-02-19: 10 mL

## 2019-02-19 MED ORDER — LIDOCAINE HCL 1 % IJ SOLN
INTRAMUSCULAR | Status: AC
  Filled 2019-02-19: qty 20

## 2019-02-19 NOTE — Procedures (Signed)
Ultrasound-guided  therapeutic right thoracentesis performed yielding 670 cc of yellow fluid. No immediate complications. Follow-up chest x-ray pending. EBL none.

## 2019-02-21 MED ORDER — DARBEPOETIN ALFA 300 MCG/0.6ML IJ SOSY
PREFILLED_SYRINGE | INTRAMUSCULAR | Status: AC
  Filled 2019-02-21: qty 0.6

## 2019-02-25 DIAGNOSIS — J9601 Acute respiratory failure with hypoxia: Secondary | ICD-10-CM | POA: Diagnosis not present

## 2019-03-05 ENCOUNTER — Other Ambulatory Visit: Payer: Self-pay | Admitting: Radiation Therapy

## 2019-03-06 ENCOUNTER — Telehealth: Payer: Self-pay | Admitting: *Deleted

## 2019-03-06 ENCOUNTER — Other Ambulatory Visit (HOSPITAL_COMMUNITY): Payer: Self-pay | Admitting: *Deleted

## 2019-03-06 ENCOUNTER — Other Ambulatory Visit (HOSPITAL_COMMUNITY)
Admission: RE | Admit: 2019-03-06 | Discharge: 2019-03-06 | Disposition: A | Discharge status: Home / Self Care | Payer: BC Managed Care – PPO | Source: Ambulatory Visit | Attending: Hematology | Admitting: Hematology

## 2019-03-06 ENCOUNTER — Other Ambulatory Visit: Payer: Self-pay

## 2019-03-06 ENCOUNTER — Other Ambulatory Visit (HOSPITAL_COMMUNITY): Payer: BC Managed Care – PPO

## 2019-03-06 ENCOUNTER — Other Ambulatory Visit: Payer: Self-pay | Admitting: Hematology

## 2019-03-06 ENCOUNTER — Ambulatory Visit (HOSPITAL_COMMUNITY): Admission: RE | Admit: 2019-03-06 | Payer: BC Managed Care – PPO | Source: Ambulatory Visit

## 2019-03-06 ENCOUNTER — Inpatient Hospital Stay (HOSPITAL_COMMUNITY)
Admission: EM | Admit: 2019-03-06 | Discharge: 2019-03-11 | DRG: 180 | Disposition: A | Discharge status: Home Health | Payer: BC Managed Care – PPO | Attending: Internal Medicine | Admitting: Internal Medicine

## 2019-03-06 ENCOUNTER — Emergency Department (HOSPITAL_COMMUNITY): Payer: BC Managed Care – PPO

## 2019-03-06 ENCOUNTER — Encounter (HOSPITAL_COMMUNITY): Payer: Self-pay

## 2019-03-06 DIAGNOSIS — D6481 Anemia due to antineoplastic chemotherapy: Secondary | ICD-10-CM

## 2019-03-06 DIAGNOSIS — Z9981 Dependence on supplemental oxygen: Secondary | ICD-10-CM | POA: Diagnosis not present

## 2019-03-06 DIAGNOSIS — J918 Pleural effusion in other conditions classified elsewhere: Secondary | ICD-10-CM | POA: Diagnosis not present

## 2019-03-06 DIAGNOSIS — J9601 Acute respiratory failure with hypoxia: Secondary | ICD-10-CM | POA: Diagnosis not present

## 2019-03-06 DIAGNOSIS — Z79899 Other long term (current) drug therapy: Secondary | ICD-10-CM

## 2019-03-06 DIAGNOSIS — C7802 Secondary malignant neoplasm of left lung: Secondary | ICD-10-CM | POA: Diagnosis present

## 2019-03-06 DIAGNOSIS — R0600 Dyspnea, unspecified: Secondary | ICD-10-CM

## 2019-03-06 DIAGNOSIS — R609 Edema, unspecified: Secondary | ICD-10-CM | POA: Diagnosis not present

## 2019-03-06 DIAGNOSIS — Z801 Family history of malignant neoplasm of trachea, bronchus and lung: Secondary | ICD-10-CM | POA: Diagnosis not present

## 2019-03-06 DIAGNOSIS — Z20828 Contact with and (suspected) exposure to other viral communicable diseases: Secondary | ICD-10-CM | POA: Diagnosis present

## 2019-03-06 DIAGNOSIS — R0602 Shortness of breath: Secondary | ICD-10-CM

## 2019-03-06 DIAGNOSIS — C50912 Malignant neoplasm of unspecified site of left female breast: Secondary | ICD-10-CM | POA: Diagnosis present

## 2019-03-06 DIAGNOSIS — Z01818 Encounter for other preprocedural examination: Secondary | ICD-10-CM | POA: Diagnosis not present

## 2019-03-06 DIAGNOSIS — C7951 Secondary malignant neoplasm of bone: Secondary | ICD-10-CM | POA: Diagnosis present

## 2019-03-06 DIAGNOSIS — Z79891 Long term (current) use of opiate analgesic: Secondary | ICD-10-CM

## 2019-03-06 DIAGNOSIS — C50911 Malignant neoplasm of unspecified site of right female breast: Secondary | ICD-10-CM | POA: Diagnosis not present

## 2019-03-06 DIAGNOSIS — F419 Anxiety disorder, unspecified: Secondary | ICD-10-CM | POA: Diagnosis present

## 2019-03-06 DIAGNOSIS — C50812 Malignant neoplasm of overlapping sites of left female breast: Secondary | ICD-10-CM | POA: Diagnosis not present

## 2019-03-06 DIAGNOSIS — R918 Other nonspecific abnormal finding of lung field: Secondary | ICD-10-CM | POA: Diagnosis not present

## 2019-03-06 DIAGNOSIS — J910 Malignant pleural effusion: Secondary | ICD-10-CM

## 2019-03-06 DIAGNOSIS — Z87891 Personal history of nicotine dependence: Secondary | ICD-10-CM | POA: Diagnosis not present

## 2019-03-06 DIAGNOSIS — Z9221 Personal history of antineoplastic chemotherapy: Secondary | ICD-10-CM | POA: Diagnosis not present

## 2019-03-06 DIAGNOSIS — R0689 Other abnormalities of breathing: Secondary | ICD-10-CM | POA: Diagnosis not present

## 2019-03-06 DIAGNOSIS — C50919 Malignant neoplasm of unspecified site of unspecified female breast: Secondary | ICD-10-CM | POA: Diagnosis not present

## 2019-03-06 DIAGNOSIS — J9621 Acute and chronic respiratory failure with hypoxia: Secondary | ICD-10-CM | POA: Diagnosis not present

## 2019-03-06 DIAGNOSIS — C7931 Secondary malignant neoplasm of brain: Secondary | ICD-10-CM | POA: Diagnosis present

## 2019-03-06 DIAGNOSIS — G893 Neoplasm related pain (acute) (chronic): Secondary | ICD-10-CM | POA: Diagnosis not present

## 2019-03-06 DIAGNOSIS — J90 Pleural effusion, not elsewhere classified: Secondary | ICD-10-CM | POA: Diagnosis not present

## 2019-03-06 DIAGNOSIS — Z800 Family history of malignant neoplasm of digestive organs: Secondary | ICD-10-CM | POA: Diagnosis not present

## 2019-03-06 DIAGNOSIS — T451X5A Adverse effect of antineoplastic and immunosuppressive drugs, initial encounter: Secondary | ICD-10-CM | POA: Diagnosis not present

## 2019-03-06 DIAGNOSIS — C770 Secondary and unspecified malignant neoplasm of lymph nodes of head, face and neck: Secondary | ICD-10-CM | POA: Diagnosis not present

## 2019-03-06 DIAGNOSIS — Z171 Estrogen receptor negative status [ER-]: Secondary | ICD-10-CM

## 2019-03-06 DIAGNOSIS — Z803 Family history of malignant neoplasm of breast: Secondary | ICD-10-CM | POA: Diagnosis not present

## 2019-03-06 DIAGNOSIS — D75839 Thrombocytosis, unspecified: Secondary | ICD-10-CM

## 2019-03-06 DIAGNOSIS — J189 Pneumonia, unspecified organism: Secondary | ICD-10-CM

## 2019-03-06 DIAGNOSIS — D473 Essential (hemorrhagic) thrombocythemia: Secondary | ICD-10-CM

## 2019-03-06 DIAGNOSIS — C7801 Secondary malignant neoplasm of right lung: Secondary | ICD-10-CM | POA: Diagnosis not present

## 2019-03-06 DIAGNOSIS — C7981 Secondary malignant neoplasm of breast: Secondary | ICD-10-CM | POA: Diagnosis not present

## 2019-03-06 DIAGNOSIS — R52 Pain, unspecified: Secondary | ICD-10-CM | POA: Diagnosis not present

## 2019-03-06 DIAGNOSIS — R0902 Hypoxemia: Secondary | ICD-10-CM | POA: Diagnosis not present

## 2019-03-06 LAB — CBC WITH DIFFERENTIAL/PLATELET
Abs Immature Granulocytes: 0.11 10*3/uL — ABNORMAL HIGH (ref 0.00–0.07)
Basophils Absolute: 0 10*3/uL (ref 0.0–0.1)
Basophils Relative: 0 %
Eosinophils Absolute: 0 10*3/uL (ref 0.0–0.5)
Eosinophils Relative: 0 %
HCT: 36.8 % (ref 36.0–46.0)
Hemoglobin: 10.4 g/dL — ABNORMAL LOW (ref 12.0–15.0)
Immature Granulocytes: 1 %
Lymphocytes Relative: 7 %
Lymphs Abs: 0.7 10*3/uL (ref 0.7–4.0)
MCH: 25.7 pg — ABNORMAL LOW (ref 26.0–34.0)
MCHC: 28.3 g/dL — ABNORMAL LOW (ref 30.0–36.0)
MCV: 91.1 fL (ref 80.0–100.0)
Monocytes Absolute: 0.9 10*3/uL (ref 0.1–1.0)
Monocytes Relative: 8 %
Neutro Abs: 9.2 10*3/uL — ABNORMAL HIGH (ref 1.7–7.7)
Neutrophils Relative %: 84 %
Platelets: 500 10*3/uL — ABNORMAL HIGH (ref 150–400)
RBC: 4.04 MIL/uL (ref 3.87–5.11)
RDW: 19.8 % — ABNORMAL HIGH (ref 11.5–15.5)
WBC: 10.9 10*3/uL — ABNORMAL HIGH (ref 4.0–10.5)
nRBC: 0 % (ref 0.0–0.2)

## 2019-03-06 LAB — COMPREHENSIVE METABOLIC PANEL
ALT: 15 U/L (ref 0–44)
AST: 24 U/L (ref 15–41)
Albumin: 3 g/dL — ABNORMAL LOW (ref 3.5–5.0)
Alkaline Phosphatase: 73 U/L (ref 38–126)
Anion gap: 11 (ref 5–15)
BUN: 10 mg/dL (ref 6–20)
CO2: 29 mmol/L (ref 22–32)
Calcium: 9.2 mg/dL (ref 8.9–10.3)
Chloride: 102 mmol/L (ref 98–111)
Creatinine, Ser: 0.89 mg/dL (ref 0.44–1.00)
GFR calc Af Amer: >60 mL/min (ref >60)
GFR calc non Af Amer: >60 mL/min (ref >60)
Glucose, Bld: 95 mg/dL (ref 70–99)
Potassium: 4.2 mmol/L (ref 3.5–5.1)
Sodium: 142 mmol/L (ref 135–145)
Total Bilirubin: 0.4 mg/dL (ref 0.3–1.2)
Total Protein: 7.8 g/dL (ref 6.5–8.1)

## 2019-03-06 LAB — SARS CORONAVIRUS 2 BY RT PCR (HOSPITAL ORDER, PERFORMED IN ~~LOC~~ HOSPITAL LAB): SARS Coronavirus 2: NEGATIVE

## 2019-03-06 MED ORDER — ONDANSETRON HCL 4 MG PO TABS: 4.0000 mg | ORAL_TABLET | Freq: Four times a day (QID) | ORAL | Status: DC | PRN

## 2019-03-06 MED ORDER — ONDANSETRON HCL 4 MG/2ML IJ SOLN: 4.0000 mg | Freq: Four times a day (QID) | INTRAMUSCULAR | Status: DC | PRN

## 2019-03-06 MED ORDER — OXYCODONE HCL 5 MG PO TABS
10.0000 mg | ORAL_TABLET | Freq: Four times a day (QID) | ORAL | Status: DC | PRN
  Administered 2019-03-06 – 2019-03-07 (×3): 10 mg via ORAL
  Filled 2019-03-06 (×3): qty 2

## 2019-03-06 MED ORDER — ACETAMINOPHEN 325 MG PO TABS
650.0000 mg | ORAL_TABLET | Freq: Four times a day (QID) | ORAL | Status: DC | PRN
  Administered 2019-03-07 – 2019-03-09 (×2): 650 mg via ORAL
  Filled 2019-03-06 (×3): qty 2

## 2019-03-06 MED ORDER — LORAZEPAM 0.5 MG PO TABS
0.5000 mg | ORAL_TABLET | Freq: Four times a day (QID) | ORAL | Status: DC | PRN
  Administered 2019-03-07 – 2019-03-11 (×10): 0.5 mg via ORAL
  Filled 2019-03-06 (×10): qty 1

## 2019-03-06 MED ORDER — FUROSEMIDE 10 MG/ML IJ SOLN
40.0000 mg | Freq: Once | INTRAMUSCULAR | Status: AC
  Administered 2019-03-06: 40 mg via INTRAVENOUS
  Filled 2019-03-06: qty 4

## 2019-03-06 MED ORDER — GABAPENTIN 300 MG PO CAPS
600.0000 mg | ORAL_CAPSULE | Freq: Two times a day (BID) | ORAL | Status: DC
  Administered 2019-03-06 – 2019-03-11 (×10): 600 mg via ORAL
  Filled 2019-03-06 (×10): qty 2

## 2019-03-06 MED ORDER — ORAL CARE MOUTH RINSE
15.0000 mL | Freq: Two times a day (BID) | OROMUCOSAL | Status: DC
  Administered 2019-03-07 – 2019-03-11 (×8): 15 mL via OROMUCOSAL

## 2019-03-06 MED ORDER — GUAIFENESIN ER 600 MG PO TB12
600.0000 mg | ORAL_TABLET | Freq: Two times a day (BID) | ORAL | Status: DC
  Administered 2019-03-06 – 2019-03-11 (×10): 600 mg via ORAL
  Filled 2019-03-06 (×10): qty 1

## 2019-03-06 MED ORDER — ACETAMINOPHEN 650 MG RE SUPP: 650.0000 mg | Freq: Four times a day (QID) | RECTAL | Status: DC | PRN

## 2019-03-06 MED ORDER — MORPHINE SULFATE ER 30 MG PO TBCR
30.0000 mg | EXTENDED_RELEASE_TABLET | Freq: Two times a day (BID) | ORAL | Status: DC
  Administered 2019-03-06 – 2019-03-11 (×10): 30 mg via ORAL
  Filled 2019-03-06 (×10): qty 1

## 2019-03-06 MED ORDER — SODIUM CHLORIDE 0.9% FLUSH
3.0000 mL | Freq: Two times a day (BID) | INTRAVENOUS | Status: DC
  Administered 2019-03-06 – 2019-03-11 (×10): 3 mL via INTRAVENOUS

## 2019-03-06 MED ORDER — CHLORHEXIDINE GLUCONATE CLOTH 2 % EX PADS
6.0000 | MEDICATED_PAD | Freq: Every day | CUTANEOUS | Status: DC
  Administered 2019-03-06 – 2019-03-11 (×5): 6 via TOPICAL

## 2019-03-06 NOTE — ED Triage Notes (Addendum)
Pt BIB EMS from home. Pt has abdominal distention and looks to be retaining fluid. Pt has hx of stage 4 breast cancer. Pt is normally on 4L O2 at home, EMS placed her on 10L non-rebreather. Pt is on chemo. Pt sent here to have fluid drained from lungs.  20G LH

## 2019-03-06 NOTE — H&P (Signed)
History and Physical    Sharon Floyd ZOX:[id number] DOB: 02/28/1976 DOA: 03/06/2019  PCP: Baruch Gouty, FNP  Patient coming from: Home via EMS  I have personally briefly reviewed patient's old medical records in [place]  Chief Complaint: Dyspnea  HPI: Sharon Floyd is a 43 y.o. female with medical history significant for stage IV metastatic left-sided breast cancer and malignant left pleural effusion who presents to the ED for evaluation of acutely worsening dyspnea.  Patient has chronic respiratory failure with hypoxia requiring 3-4 L supplemental O2 via [place] at all times due to metastatic pulmonary disease and known metastatic left pleural effusion.  She had a Pleurx catheter in place in March 2020 which was removed on 02/16/2019.  She most recently had IR guided thoracentesis of the right side with removal of 670 cc pleural fluid.  She is planned to have repeat thoracentesis today (03/06/2019) at [facility] however had acute worsening of her shortness of breath therefore called EMS.  Her oncologist was contacted and recommended that she be brought to [facility] long ED.  Patient is currently undergoing palliative chemotherapy status post 4 cycles of THP and currently on Kadcyla.  She also has known brain metastases and underwent SBRT on 11/08/2018.  She is scheduled to have repeat MRI brain on 03/07/2019.  She currently reports orthopnea and cough productive of intermittent clear sputum.  She denies any subjective fevers, diaphoresis, chills, or abdominal pain.  She reports good urine output without dysuria.  She denies any constipation or diarrhea.  ED Course:  Initial vitals showed BP 125/92, pulse 114, RR 16, temp 99.5 Fahrenheit, SPO2 100% on nonrebreather.  Patient was transitioned to 4 L supplemental O2 via [place] saturating around 93% per EDP.  Labs notable for WBC 10.9, hemoglobin 10.4, platelets 500,000, sodium 142, potassium 4.2, bicarb 29, BUN 10, creatinine 0.89.   SARS-CoV-2 test was obtained and pending.  Portable chest x-ray showed worsening appearance of both lungs with increasing pleural-parenchymal opacities in the left hemithorax felt due to combination of metastatic disease, loculated pleural fluid, and possible posterior obstructive pneumonia.  Right IJ Port-A-Cath appears unchanged with tip extending to the right atrial level.  EDP discussed the case with oncology who will see in a.m.  The hospitalist service was consulted to admit for further evaluation and management.  Review of Systems: All systems reviewed and are negative except as documented in history of present illness above.   Past Medical History:  Diagnosis Date  . Arthritis   . Cancer [facility])    breast  . Family history of breast cancer   . Family history of colon cancer     Past Surgical History:  Procedure Laterality Date  . IR IMAGING GUIDED PORT INSERTION  10/19/2018  . IR PERC PLEURAL DRAIN W/INDWELL CATH W/IMG GUIDE  11/01/2018  . IR REMOVAL OF PLURAL CATH W/CUFF  02/16/2019  . IR SINUS/FIST TUBE CHK-NON GI  12/06/2018  . IR THORACENTESIS ASP PLEURAL SPACE W/IMG GUIDE  02/19/2019  . IR US GUIDE BX ASP/DRAIN  10/19/2018  . PATELLA REALIGNMENT Left 1992    Social History:  reports that she quit smoking about 4 months ago. Her smoking use included cigarettes. She has never used smokeless tobacco. She reports previous alcohol use. She reports that she does not use drugs.  Allergies  Allergen Reactions  . Aspirin Other (See Comments)    CHILDHOOD REACTION:UNKNOWN    Family History  Problem Relation Age of Onset  . Arthritis Mother   .  Diabetes Father   . Hypertension Father   . Arthritis Father   . Heart disease Father   . Early death Maternal Grandmother   . Asthma Maternal Grandfather   . Heart disease Maternal Grandfather   . Migraines Maternal Grandfather   . Early death Maternal Grandfather   . Asthma Paternal Grandmother   . Hypertension Paternal  Grandmother   . Asthma Paternal Grandfather   . Hypertension Paternal Grandfather   . Breast cancer Other        paternal cousin, female  . Colon cancer Cousin        paternal  . Breast cancer Cousin        paternal  . Lung cancer Other      Prior to Admission medications   Medication Sig Start Date End Date Taking? Authorizing Provider  gabapentin (NEURONTIN) 300 MG capsule TAKE 2 CAPSULES (600 MG TOTAL) BY MOUTH 2 (TWO) TIMES DAILY. 01/08/19  Yes Tish Men, MD  guaiFENesin (MUCINEX) 600 MG 12 hr tablet Take 600 mg by mouth 2 (two) times daily.   Yes [provider]  LORazepam (ATIVAN) 0.5 MG tablet Take 1 tablet (0.5 mg total) by mouth every 6 (six) hours as needed (Nausea or vomiting). 11/03/18  Yes Tish Men, MD  morphine (MS CONTIN) 30 MG 12 hr tablet Take 1 tablet (30 mg total) by mouth every 12 (twelve) hours. 02/07/19  Yes Tish Men, MD  ondansetron (ZOFRAN) 8 MG tablet Take 1 tablet (8 mg total) by mouth 2 (two) times daily as needed for refractory nausea / vomiting. 11/03/18  Yes Tish Men, MD  oxyCODONE (OXY IR/ROXICODONE) 5 MG immediate release tablet Take 2 tablets (10 mg total) by mouth every 6 (six) hours as needed for severe pain. 02/07/19 03/09/19 Yes Tish Men, MD  prochlorperazine (COMPAZINE) 10 MG tablet Take 1 tablet (10 mg total) by mouth every 6 (six) hours as needed (Nausea or vomiting). 11/03/18  Yes Tish Men, MD  ado-trastuzumab emtansine 360 mg in sodium chloride 0.9 % 250 mL Inject 360 mg into the vein every 21 ( twenty-one) days for 12 doses. Patient not taking: Reported on 03/06/2019 02/08/19 09/28/19  Tish Men, MD  dexamethasone (DECADRON) 4 MG tablet Take 2 tablets (8 mg total) by mouth 2 (two) times daily. Start the day before Taxotere. Then daily after chemo for 2 days. Patient not taking: Reported on 03/06/2019 11/03/18   Tish Men, MD  lidocaine-prilocaine (EMLA) cream Apply to affected area once 11/03/18   Tish Men, MD  UNABLE TO FIND As per Medical necessity,  Mastectomy Bra Q#2-Dx Code C50.812, Z17.1 Malignant neoplasm of overlapping sites of left breast in female, estrogen receptor negative 02/15/19   Tish Men, MD    Physical Exam: Vitals:   03/06/19 1530 03/06/19 1600 03/06/19 1630 03/06/19 1804  BP: 137/82 (!) 134/100 (!) 142/109 (!) 146/109  Pulse: (!) 110 (!) 109 (!) 109 (!) 108  Resp: 20 17 11 16   Temp:      TempSrc:      SpO2: 100% 100% 93% 99%    Constitutional: Chronically ill-appearing woman sitting up in bed, she is somewhat anxious and appears tired Eyes: PERRL, lids and conjunctivae normal ENMT: Mucous membranes are moist. Posterior pharynx clear of any exudate or lesions.Normal dentition.  Neck: normal, supple, no masses. Respiratory: Diminished breath sounds at the lung bases, inspiratory crackles upper lung fields.  Dullness to percussion noted at the lower lung fields.  Respiratory effort is slightly increased. No  accessory muscle use.  Cardiovascular: Regular rate and rhythm, no murmurs / rubs / gallops. No extremity edema. 2+ pedal pulses. Abdomen: no tenderness, no masses palpated. No hepatosplenomegaly. Bowel sounds positive.  Musculoskeletal: no clubbing / cyanosis. No joint deformity upper and lower extremities. Good ROM, no contractures. Normal muscle tone.  Skin: no rashes, lesions, ulcers. No induration Neurologic: CN 2-12 grossly intact. Sensation intact, Strength 5/5 in all 4.  Psychiatric: Normal judgment and insight. Alert and oriented x 3. Normal mood.     Labs on Admission: I have personally reviewed following labs and imaging studies  CBC: Recent Labs  Lab 03/06/19 1617  WBC 10.9*  NEUTROABS 9.2*  HGB 10.4*  HCT 36.8  MCV 91.1  PLT 025*   Basic Metabolic Panel: Recent Labs  Lab 03/06/19 1617  NA 142  K 4.2  CL 102  CO2 29  GLUCOSE 95  BUN 10  CREATININE 0.89  CALCIUM 9.2   GFR: CrCl cannot be calculated (Unknown ideal weight.). Liver Function Tests: Recent Labs  Lab 03/06/19 1617   AST 24  ALT 15  ALKPHOS 73  BILITOT 0.4  PROT 7.8  ALBUMIN 3.0*   No results for input(s): LIPASE, AMYLASE in the last 168 hours. No results for input(s): AMMONIA in the last 168 hours. Coagulation Profile: No results for input(s): INR, PROTIME in the last 168 hours. Cardiac Enzymes: No results for input(s): CKTOTAL, CKMB, CKMBINDEX, TROPONINI in the last 168 hours. BNP (last 3 results) No results for input(s): PROBNP in the last 8760 hours. HbA1C: No results for input(s): HGBA1C in the last 72 hours. CBG: No results for input(s): GLUCAP in the last 168 hours. Lipid Profile: No results for input(s): CHOL, HDL, LDLCALC, TRIG, CHOLHDL, LDLDIRECT in the last 72 hours. Thyroid Function Tests: No results for input(s): TSH, T4TOTAL, FREET4, T3FREE, THYROIDAB in the last 72 hours. Anemia Panel: No results for input(s): VITAMINB12, FOLATE, FERRITIN, TIBC, IRON, RETICCTPCT in the last 72 hours. Urine analysis: No results found for: COLORURINE, APPEARANCEUR, LABSPEC, PHURINE, GLUCOSEU, HGBUR, BILIRUBINUR, KETONESUR, PROTEINUR, UROBILINOGEN, NITRITE, LEUKOCYTESUR  Radiological Exams on Admission: Dg Chest Port 1 View  Result Date: 03/06/2019 CLINICAL DATA:  Abdominal distension with fluid retention and shortness of breath. Stage IV breast cancer. EXAM: PORTABLE CHEST 1 VIEW COMPARISON:  Radiographs 02/19/2019 and 01/04/2019.  CT 12/07/2018. FINDINGS: 1522 hours. Two views obtained. There are progressively lower lung volumes. The right IJ Port-A-Cath appears grossly unchanged, tip extending to the right atrial level. There is progressive opacification of left hemithorax due to a combination of left lower lobe consolidation, a loculated pleural effusion and metastatic disease. Multiple nodules in the right lung are again noted. No pneumothorax. Telemetry leads overlie the chest. IMPRESSION: Overall worsening of the lungs with increasing pleuroparenchymal opacities in the left hemithorax,  attributed to a combination of metastatic disease, loculated pleural fluid and possible posterior obstructive pneumonia. No significant changes seen in the right lung. Electronically Signed   By: Richardean Sale M.D.   On: 03/06/2019 16:44    EKG: Not performed.  Assessment/Plan Principal Problem:   Acute on chronic respiratory failure with hypoxia (HCC) Active Problems:   Malignant pleural effusion   Malignant neoplasm of left female breast (HCC)  Sharon Floyd is a 43 y.o. female with medical history significant for stage IV metastatic left-sided breast cancer and malignant left pleural effusion who is admitted with acute on chronic respiratory failure with hypoxia due to malignant pleural effusions and pulmonary metastases.   Acute on  chronic respiratory failure with hypoxia due to pulmonary metastases and malignant pleural effusion: Worsening opacities of the left hemithorax seen on chest x-ray.  Concern for loculated pleural fluid per radiology read.  Recently had Pleurx catheter removed from left side as patient was unable to drain catheter.  Initially required nonrebreather, now transitioned to nasal cannula. -Continue supplemental oxygen -IR thoracentesis requested -Will keep n.p.o. at midnight -Trial IV Lasix 40 mg once tonight -EDP discussed with oncology who will see tomorrow  Stage IV inflammatory left breast cancer with metastases to the brain and lungs: Follows with oncology, Dr. Maylon Peppers.  Currently receiving palliative therapy with Kadcyla.  Oncology plan for repeat MRI brain tomorrow, however currently patient would not be able to lay supine long enough for this to be performed. -Oncology to see tomorrow  Chronic cancer related pain: Continue home MS Contin 30 mg every 12 hours and as needed OxyIR 10 mg every 6 hours with hold parameters.  Anxiety: Continue Ativan 0.5 mg every 6 hours as needed with hold parameters.   DVT prophylaxis: SCDs Code Status: Full code,  confirmed with patient Family Communication: Discussed with patient, she will discuss with family Disposition Plan: Pending clinical progress Consults called: Oncology consulted by EDP, to see in a.m. Admission status: Admit - It is my clinical opinion that admission to INPATIENT is reasonable and necessary because of the expectation that this patient will require hospital care that crosses at least 2 midnights to treat this condition based on the medical complexity of the problems presented.  Given the aforementioned information, the predictability of an adverse outcome is felt to be significant.      Zada Finders MD [facility]  If 7PM-7AM, please contact night-coverage [email or web address]  03/06/2019, 6:31 PM

## 2019-03-06 NOTE — Telephone Encounter (Signed)
Called patient and instructed her to go to the [facility] (old [facility]) near [facility] for a rapid Covid test. Address is [address], [place]. Covid test is at 12:25pm. She is scheduled for a thoracentesis at [facility] Radiology Department at 1:30 pm. You need to bring the results of the Covid test with you. The thoracentesis doesn't need a prior authorization with your insurance. She verbalized understanding.

## 2019-03-06 NOTE — ED Notes (Signed)
ED TO INPATIENT HANDOFF REPORT  Name/Age/Gender Sharon Floyd 43 y.o. female  Code Status Code Status History    Date Active Date Inactive Code Status Order ID Comments User Context   10/18/2018 0052 10/20/2018 2110 Full Code [id number]  Shela Leff, MD ED   Advance Care Planning Activity      Home/SNF/Other Home  Chief Complaint breast cancer pt abdominal distention  Level of Care/Admitting Diagnosis ED Disposition    ED Disposition Condition [place]: [facility] [[id number]]  Level of Care: Stepdown [14]  Admit to SDU based on following criteria: Respiratory Distress:  Frequent assessment and/or intervention to maintain adequate ventilation/respiration, pulmonary toilet, and respiratory treatment.  Covid Evaluation: Asymptomatic Screening Protocol (No Symptoms)  Diagnosis: Acute on chronic respiratory failure with hypoxia [facility]) [[id number]]  Admitting Physician: Lenore Cordia [[id number]]  Attending Physician: Lenore Cordia [[id number]]  Estimated length of stay: past midnight tomorrow  Certification:: I certify this patient will need inpatient services for at least 2 midnights  PT Class (Do Not Modify): Inpatient [101]  PT Acc Code (Do Not Modify): Private [1]       Medical History Past Medical History:  Diagnosis Date  . Arthritis   . Cancer [facility])    breast  . Family history of breast cancer   . Family history of colon cancer     Allergies Allergies  Allergen Reactions  . Aspirin Other (See Comments)    CHILDHOOD REACTION:UNKNOWN    IV Location/Drains/Wounds Patient Lines/Drains/Airways Status   Active Line/Drains/Airways    Name:   Placement date:   Placement time:   Site:   Days:   Implanted Port 10/19/18 Right Chest   10/19/18    1547    Chest   138   Peripheral IV 03/06/19 Left Antecubital   03/06/19    1615    Antecubital   less than 1          Labs/Imaging Results for orders placed or performed during  the hospital encounter of 03/06/19 (from the past 48 hour(s))  Comprehensive metabolic panel     Status: Abnormal   Collection Time: 03/06/19  4:17 PM  Result Value Ref Range   Sodium 142 135 - 145 mmol/L   Potassium 4.2 3.5 - 5.1 mmol/L   Chloride 102 98 - 111 mmol/L   CO2 29 22 - 32 mmol/L   Glucose, Bld 95 70 - 99 mg/dL   BUN 10 6 - 20 mg/dL   Creatinine, Ser 0.89 0.44 - 1.00 mg/dL   Calcium 9.2 8.9 - 10.3 mg/dL   Total Protein 7.8 6.5 - 8.1 g/dL   Albumin 3.0 (L) 3.5 - 5.0 g/dL   AST 24 15 - 41 U/L   ALT 15 0 - 44 U/L   Alkaline Phosphatase 73 38 - 126 U/L   Total Bilirubin 0.4 0.3 - 1.2 mg/dL   GFR calc non Af Amer >60 >60 mL/min   GFR calc Af Amer >60 >60 mL/min   Anion gap 11 5 - 15    Comment: Performed at [facility], [place] [address] [place], [place] [address]  CBC with Differential/Platelet     Status: Abnormal   Collection Time: 03/06/19  4:17 PM  Result Value Ref Range   WBC 10.9 (H) 4.0 - 10.5 K/uL   RBC 4.04 3.87 - 5.11 MIL/uL   Hemoglobin 10.4 (L) 12.0 - 15.0 g/dL   HCT 36.8 36.0 - 46.0 %  MCV 91.1 80.0 - 100.0 fL   MCH 25.7 (L) 26.0 - 34.0 pg   MCHC 28.3 (L) 30.0 - 36.0 g/dL   RDW 19.8 (H) 11.5 - 15.5 %   Platelets 500 (H) 150 - 400 K/uL   nRBC 0.0 0.0 - 0.2 %   Neutrophils Relative % 84 %   Neutro Abs 9.2 (H) 1.7 - 7.7 K/uL   Lymphocytes Relative 7 %   Lymphs Abs 0.7 0.7 - 4.0 K/uL   Monocytes Relative 8 %   Monocytes Absolute 0.9 0.1 - 1.0 K/uL   Eosinophils Relative 0 %   Eosinophils Absolute 0.0 0.0 - 0.5 K/uL   Basophils Relative 0 %   Basophils Absolute 0.0 0.0 - 0.1 K/uL   Immature Granulocytes 1 %   Abs Immature Granulocytes 0.11 (H) 0.00 - 0.07 K/uL    Comment: Performed at [facility], [place] [address] [place], [place] [address]   Dg Chest Port 1 View  Result Date: 03/06/2019 CLINICAL DATA:  Abdominal distension with fluid retention and shortness of breath. Stage IV breast cancer. EXAM: PORTABLE CHEST  1 VIEW COMPARISON:  Radiographs 02/19/2019 and 01/04/2019.  CT 12/07/2018. FINDINGS: 1522 hours. Two views obtained. There are progressively lower lung volumes. The right IJ Port-A-Cath appears grossly unchanged, tip extending to the right atrial level. There is progressive opacification of left hemithorax due to a combination of left lower lobe consolidation, a loculated pleural effusion and metastatic disease. Multiple nodules in the right lung are again noted. No pneumothorax. Telemetry leads overlie the chest. IMPRESSION: Overall worsening of the lungs with increasing pleuroparenchymal opacities in the left hemithorax, attributed to a combination of metastatic disease, loculated pleural fluid and possible posterior obstructive pneumonia. No significant changes seen in the right lung. Electronically Signed   By: Richardean Sale M.D.   On: 03/06/2019 16:44    Pending Labs Unresulted Labs (From admission, onward)    Start     Ordered   03/06/19 1706  SARS Coronavirus 2 (CEPHEID - Performed in [place] hospital lab), Hosp Order  (Asymptomatic Patients Labs)  Once,   STAT    Question:  Rule Out  Answer:  Yes   03/06/19 1705          Vitals/Pain Today's Vitals   03/06/19 1804 03/06/19 1900 03/06/19 1905 03/06/19 1927  BP: (!) 146/109 (!) 132/99 (!) 132/99   Pulse: (!) 108 (!) 109 (!) 112   Resp: 16 14 20    Temp:      TempSrc:      SpO2: 99% 100% 97%   PainSc:    0-No pain    Isolation Precautions No active isolations  Medications Medications - No data to display  Mobility walks

## 2019-03-06 NOTE — Progress Notes (Unsigned)
.  ho

## 2019-03-06 NOTE — Telephone Encounter (Signed)
Call received from patient stating that she feels as though she is "retaining fluids, has a cough with bright yellow sputum and  increased SOB."  Pt states that she is currently on O2 @ 3L [place].  Dr. Maylon Peppers notified.  Order received for patient to have pleurx catheter placed today.  Informed pt of Dr. Lorette Ang orders and that office would call her back with details once IR is contacted.

## 2019-03-06 NOTE — Telephone Encounter (Signed)
Call received from pt stating that she is awaiting the ambulance to take her to [place] pt and pt.'s mother per order of Dr. Maylon Peppers to have the ambulance take her to [facility].  Patient placed paramedic on phone and he states that if pt is stable enough, he will take her to [facility], if not, he will take her to [facility].  Dr. Maylon Peppers notified.

## 2019-03-06 NOTE — ED Provider Notes (Signed)
[facility] DEPT Provider Note   CSN: [id number] Arrival date & time: 03/06/19  1408    History   Chief Complaint Chief Complaint  Patient presents with  . Abdominal Pain  . Shortness of Breath    HPI Sharon Floyd is a 43 y.o. female.     Patient followed by hematology oncology.  Patient with known metastatic breast cancer.  Patient normally on 3 to 4 L of oxygen at home.  Patient currently undergoing chemotherapy.  Patient is known to have stage IV breast cancer.  Patient was referred according to heme-onc notes to here at any [facility] to have the fluid drained by radiology at 130 this afternoon.  Patient got acutely short of breath contacted EMS and EMS brought her here.  Patient's only complaint is shortness of breath.  She was brought in 100% nonrebreather.  Patient without any other complaints.  Denies any fever denies any upper respiratory symptoms.     Past Medical History:  Diagnosis Date  . Arthritis   . Cancer [facility])    breast  . Family history of breast cancer   . Family history of colon cancer     Patient Active Problem List   Diagnosis Date Noted  . Acute on chronic respiratory failure with hypoxia ([place]) 03/06/2019  . Genetic testing 11/16/2018  . Normocytic anemia 11/03/2018  . Goals of care, counseling/discussion 11/03/2018  . Cancer-related pain 10/24/2018  . Metastasis to brain ([place])   . 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 Elevated troponin 10/18/2018  . Malignant neoplasm of left female breast ([place])   . Acute respiratory failure with hypoxia ([place]) 10/17/2018  . Family history of breast cancer   . Family history of colon cancer   . Family history of cancer 10/12/2018  . Malignant neoplasm of overlapping sites of breast ([place]) 10/10/2018    Past Surgical History:  Procedure Laterality Date  . IR IMAGING GUIDED PORT INSERTION  10/19/2018  . IR PERC PLEURAL DRAIN W/INDWELL CATH W/IMG GUIDE  11/01/2018  . IR  REMOVAL OF PLURAL CATH W/CUFF  02/16/2019  . IR SINUS/FIST TUBE CHK-NON GI  12/06/2018  . IR THORACENTESIS ASP PLEURAL SPACE W/IMG GUIDE  02/19/2019  . IR US GUIDE BX ASP/DRAIN  10/19/2018  . PATELLA REALIGNMENT Left 1992     OB History   No obstetric history on file.      Home Medications    Prior to Admission medications   Medication Sig Start Date End Date Taking? Authorizing Provider  gabapentin (NEURONTIN) 300 MG capsule TAKE 2 CAPSULES (600 MG TOTAL) BY MOUTH 2 (TWO) TIMES DAILY. 01/08/19  Yes Tish Men, MD  guaiFENesin (MUCINEX) 600 MG 12 hr tablet Take 600 mg by mouth 2 (two) times daily.   Yes [provider]  LORazepam (ATIVAN) 0.5 MG tablet Take 1 tablet (0.5 mg total) by mouth every 6 (six) hours as needed (Nausea or vomiting). 11/03/18  Yes Tish Men, MD  morphine (MS CONTIN) 30 MG 12 hr tablet Take 1 tablet (30 mg total) by mouth every 12 (twelve) hours. 02/07/19  Yes Tish Men, MD  ondansetron (ZOFRAN) 8 MG tablet Take 1 tablet (8 mg total) by mouth 2 (two) times daily as needed for refractory nausea / vomiting. 11/03/18  Yes Tish Men, MD  oxyCODONE (OXY IR/ROXICODONE) 5 MG immediate release tablet Take 2 tablets (10 mg total) by mouth every 6 (six) hours as needed for severe pain. 02/07/19 03/09/19 Yes Tish Men, MD  prochlorperazine (COMPAZINE) 10 MG tablet Take 1 tablet (10 mg total) by mouth every 6 (six) hours as needed (Nausea or vomiting). 11/03/18  Yes Tish Men, MD  ado-trastuzumab emtansine 360 mg in sodium chloride 0.9 % 250 mL Inject 360 mg into the vein every 21 ( twenty-one) days for 12 doses. Patient not taking: Reported on 03/06/2019 02/08/19 09/28/19  Tish Men, MD  dexamethasone (DECADRON) 4 MG tablet Take 2 tablets (8 mg total) by mouth 2 (two) times daily. Start the day before Taxotere. Then daily after chemo for 2 days. Patient not taking: Reported on 03/06/2019 11/03/18   Tish Men, MD  lidocaine-prilocaine (EMLA) cream Apply to affected area once 11/03/18    Tish Men, MD  UNABLE TO FIND As per Medical necessity, Mastectomy Bra Q#2-Dx Code C50.812, Z17.1 Malignant neoplasm of overlapping sites of left breast in female, estrogen receptor negative 02/15/19   Tish Men, MD    Family History Family History  Problem Relation Age of Onset  . Arthritis Mother   . Diabetes Father   . Hypertension Father   . Arthritis Father   . Heart disease Father   . Early death Maternal Grandmother   . Asthma Maternal Grandfather   . Heart disease Maternal Grandfather   . Migraines Maternal Grandfather   . Early death Maternal Grandfather   . Asthma Paternal Grandmother   . Hypertension Paternal Grandmother   . Asthma Paternal Grandfather   . Hypertension Paternal Grandfather   . Breast cancer Other        paternal cousin, female  . Colon cancer Cousin        paternal  . Breast cancer Cousin        paternal  . Lung cancer Other     Social History Social History   Tobacco Use  . Smoking status: Former Smoker    Types: Cigarettes    Quit date: 10/09/2018    Years since quitting: 0.4  . Smokeless tobacco: Never Used  Substance Use Topics  . Alcohol use: Not Currently  . Drug use: Never     Allergies   Aspirin   Review of Systems Review of Systems  Constitutional: Negative for chills and fever.  HENT: Negative for congestion, rhinorrhea and sore throat.   Eyes: Negative for visual disturbance.  Respiratory: Positive for shortness of breath. Negative for cough.   Cardiovascular: Negative for chest pain and leg swelling.  Gastrointestinal: Negative for abdominal pain, diarrhea, nausea and vomiting.  Genitourinary: Negative for dysuria.  Musculoskeletal: Negative for back pain and neck pain.  Skin: Negative for rash.  Neurological: Negative for dizziness, light-headedness and headaches.  Hematological: Does not bruise/bleed easily.  Psychiatric/Behavioral: Negative for confusion.     Physical Exam Updated Vital Signs BP (!) 156/102    Pulse (!) 123   Temp 99.3 F (37.4 C)   Resp 20   Ht 1.626 m (5\' 4" )   Wt 99.3 kg   SpO2 97%   BMI 37.58 kg/m   Physical Exam Vitals signs and nursing note reviewed.  Constitutional:      General: She is not in acute distress.    Appearance: Normal appearance. She is well-developed.  HENT:     Head: Normocephalic and atraumatic.  Eyes:     Extraocular Movements: Extraocular movements intact.     Conjunctiva/sclera: Conjunctivae normal.     Pupils: Pupils are equal, round, and reactive to light.  Neck:     Musculoskeletal: Neck supple.  Cardiovascular:  Rate and Rhythm: Normal rate and regular rhythm.     Heart sounds: No murmur.  Pulmonary:     Effort: Pulmonary effort is normal. No respiratory distress.     Breath sounds: Normal breath sounds.     Comments: Decreased breath sounds on the left. Abdominal:     Palpations: Abdomen is soft.     Tenderness: There is no abdominal tenderness.  Musculoskeletal: Normal range of motion.  Skin:    General: Skin is warm and dry.  Neurological:     General: No focal deficit present.     Mental Status: She is alert and oriented to person, place, and time.      ED Treatments / Results  Labs (all labs ordered are listed, but only abnormal results are displayed) Labs Reviewed  COMPREHENSIVE METABOLIC PANEL - Abnormal; Notable for the following components:      Result Value   Albumin 3.0 (*)    All other components within normal limits  CBC WITH DIFFERENTIAL/PLATELET - Abnormal; Notable for the following components:   WBC 10.9 (*)    Hemoglobin 10.4 (*)    MCH 25.7 (*)    MCHC 28.3 (*)    RDW 19.8 (*)    Platelets 500 (*)    Neutro Abs 9.2 (*)    Abs Immature Granulocytes 0.11 (*)    All other components within normal limits  SARS CORONAVIRUS 2 (HOSPITAL ORDER, [facility])  MRSA PCR SCREENING  COMPREHENSIVE METABOLIC PANEL  CBC  LACTATE DEHYDROGENASE    EKG None  Radiology Dg Chest  Port 1 View  Result Date: 03/06/2019 CLINICAL DATA:  Abdominal distension with fluid retention and shortness of breath. Stage IV breast cancer. EXAM: PORTABLE CHEST 1 VIEW COMPARISON:  Radiographs 02/19/2019 and 01/04/2019.  CT 12/07/2018. FINDINGS: 1522 hours. Two views obtained. There are progressively lower lung volumes. The right IJ Port-A-Cath appears grossly unchanged, tip extending to the right atrial level. There is progressive opacification of left hemithorax due to a combination of left lower lobe consolidation, a loculated pleural effusion and metastatic disease. Multiple nodules in the right lung are again noted. No pneumothorax. Telemetry leads overlie the chest. IMPRESSION: Overall worsening of the lungs with increasing pleuroparenchymal opacities in the left hemithorax, attributed to a combination of metastatic disease, loculated pleural fluid and possible posterior obstructive pneumonia. No significant changes seen in the right lung. Electronically Signed   By: Richardean Sale M.D.   On: 03/06/2019 16:44    Procedures Procedures (including critical care time)  Medications Ordered in ED Medications  sodium chloride flush (NS) 0.9 % injection 3 mL (has no administration in time range)  acetaminophen (TYLENOL) tablet 650 mg (has no administration in time range)    Or  acetaminophen (TYLENOL) suppository 650 mg (has no administration in time range)  gabapentin (NEURONTIN) capsule 600 mg (has no administration in time range)  guaiFENesin (MUCINEX) 12 hr tablet 600 mg (has no administration in time range)  LORazepam (ATIVAN) tablet 0.5 mg (has no administration in time range)  morphine (MS CONTIN) 12 hr tablet 30 mg (has no administration in time range)  oxyCODONE (Oxy IR/ROXICODONE) immediate release tablet 10 mg (10 mg Oral Given 03/06/19 2100)  ondansetron (ZOFRAN) injection 4 mg (has no administration in time range)    Or  ondansetron (ZOFRAN) tablet 4 mg (has no administration in  time range)  Chlorhexidine Gluconate Cloth 2 % PADS 6 each (has no administration in time range)  MEDLINE  mouth rinse (has no administration in time range)  furosemide (LASIX) injection 40 mg (40 mg Intravenous Given 03/06/19 2105)     Initial Impression / Assessment and Plan / ED Course  I have reviewed the triage vital signs and the nursing notes.  Pertinent labs & imaging results that were available during my care of the patient were reviewed by me and considered in my medical decision making (see chart for details).        Patient with known metastatic breast cancer.  Patient been having trouble with fluid collections on the left lung.  A week ago did have a permanent drain in place.  But that was removed.  Patient was referred according to the notes by hematology oncology to go to any [facility] at 130 to have radiology drain the pleural fluid.  Patient normally on 3 to 4 L of oxygen at home.  Patient got acutely short of breath called EMS and patient brought in here.  Patient arrived on 100% nonrebreather.  However oxygen sats were in the upper 90s.  We are able to wean her back to 4 L oxygen ensure her sats were around 93%.  Patient remained comfortable.  Patient without any other complaints other than the shortness of breath.  Chest x-ray consistent with loculated fluid on the left side.  No real change on the right.  Left-sided could have represented pneumonia.  But patient's white blood cell count is not markedly elevated and is down a little lower than it was recently.  Patient also without any real respiratory type symptoms COVID testing was negative.  Electrolytes without any significant abnormalities.  Feel that patient's main problem is the collection of fluid but since is loculated and there is some metastatic disease there.  Feel that is probably going to need to be done by interventional radiology.  Discussed with the admitting team who will admit.  Discussed with the  hematology oncology.  Who will consult.  Patient's hematologist is Dr. Maylon Peppers.  Discussed with Dr. Burr Medico.    Final Clinical Impressions(s) / ED Diagnoses   Final diagnoses:  SOB (shortness of breath)  Metastatic breast cancer ([place])  Pleural effusion on left    ED Discharge Orders    None       Fredia Sorrow, MD 03/06/19 2222

## 2019-03-07 ENCOUNTER — Inpatient Hospital Stay (HOSPITAL_COMMUNITY): Payer: BC Managed Care – PPO

## 2019-03-07 ENCOUNTER — Encounter (HOSPITAL_COMMUNITY): Payer: Self-pay | Admitting: Interventional Radiology

## 2019-03-07 ENCOUNTER — Inpatient Hospital Stay: Admission: RE | Admit: 2019-03-07 | Payer: BC Managed Care – PPO | Source: Ambulatory Visit

## 2019-03-07 DIAGNOSIS — D75839 Thrombocytosis, unspecified: Secondary | ICD-10-CM

## 2019-03-07 DIAGNOSIS — T451X5A Adverse effect of antineoplastic and immunosuppressive drugs, initial encounter: Secondary | ICD-10-CM

## 2019-03-07 DIAGNOSIS — J910 Malignant pleural effusion: Secondary | ICD-10-CM

## 2019-03-07 DIAGNOSIS — C50919 Malignant neoplasm of unspecified site of unspecified female breast: Secondary | ICD-10-CM

## 2019-03-07 DIAGNOSIS — D473 Essential (hemorrhagic) thrombocythemia: Secondary | ICD-10-CM

## 2019-03-07 DIAGNOSIS — D6481 Anemia due to antineoplastic chemotherapy: Secondary | ICD-10-CM

## 2019-03-07 HISTORY — PX: IR PERC PLEURAL DRAIN W/INDWELL CATH W/IMG GUIDE: IMG5383

## 2019-03-07 LAB — CBC
HCT: 36.7 % (ref 36.0–46.0)
Hemoglobin: 10.3 g/dL — ABNORMAL LOW (ref 12.0–15.0)
MCH: 25.5 pg — ABNORMAL LOW (ref 26.0–34.0)
MCHC: 28.1 g/dL — ABNORMAL LOW (ref 30.0–36.0)
MCV: 90.8 fL (ref 80.0–100.0)
Platelets: 474 10*3/uL — ABNORMAL HIGH (ref 150–400)
RBC: 4.04 MIL/uL (ref 3.87–5.11)
RDW: 19.5 % — ABNORMAL HIGH (ref 11.5–15.5)
WBC: 10.1 10*3/uL (ref 4.0–10.5)
nRBC: 0 % (ref 0.0–0.2)

## 2019-03-07 LAB — COMPREHENSIVE METABOLIC PANEL
ALT: 13 U/L (ref 0–44)
AST: 25 U/L (ref 15–41)
Albumin: 2.8 g/dL — ABNORMAL LOW (ref 3.5–5.0)
Alkaline Phosphatase: 75 U/L (ref 38–126)
Anion gap: 10 (ref 5–15)
BUN: 11 mg/dL (ref 6–20)
CO2: 32 mmol/L (ref 22–32)
Calcium: 9 mg/dL (ref 8.9–10.3)
Chloride: 98 mmol/L (ref 98–111)
Creatinine, Ser: 0.82 mg/dL (ref 0.44–1.00)
GFR calc Af Amer: >60 mL/min (ref >60)
GFR calc non Af Amer: >60 mL/min (ref >60)
Glucose, Bld: 96 mg/dL (ref 70–99)
Potassium: 3.9 mmol/L (ref 3.5–5.1)
Sodium: 140 mmol/L (ref 135–145)
Total Bilirubin: 0.6 mg/dL (ref 0.3–1.2)
Total Protein: 7.6 g/dL (ref 6.5–8.1)

## 2019-03-07 LAB — LACTATE DEHYDROGENASE: LDH: 283 U/L — ABNORMAL HIGH (ref 98–192)

## 2019-03-07 LAB — MRSA PCR SCREENING: MRSA by PCR: POSITIVE — AB

## 2019-03-07 MED ORDER — MIDAZOLAM HCL 2 MG/2ML IJ SOLN
INTRAMUSCULAR | Status: AC
  Filled 2019-03-07: qty 2

## 2019-03-07 MED ORDER — LIDOCAINE HCL 1 % IJ SOLN
INTRAMUSCULAR | Status: AC
  Filled 2019-03-07: qty 10

## 2019-03-07 MED ORDER — OXYCODONE HCL 5 MG PO TABS
10.0000 mg | ORAL_TABLET | ORAL | Status: DC | PRN
  Administered 2019-03-07 – 2019-03-11 (×17): 10 mg via ORAL
  Filled 2019-03-07 (×17): qty 2

## 2019-03-07 MED ORDER — FENTANYL CITRATE (PF) 100 MCG/2ML IJ SOLN
INTRAMUSCULAR | Status: AC | PRN
  Administered 2019-03-07 (×2): 50 ug via INTRAVENOUS

## 2019-03-07 MED ORDER — MIDAZOLAM HCL 2 MG/2ML IJ SOLN
INTRAMUSCULAR | Status: AC | PRN
  Administered 2019-03-07: 1 mg via INTRAVENOUS

## 2019-03-07 MED ORDER — MUPIROCIN 2 % EX OINT
1.0000 "application " | TOPICAL_OINTMENT | Freq: Two times a day (BID) | CUTANEOUS | Status: AC
  Administered 2019-03-07 – 2019-03-11 (×10): 1 via NASAL
  Filled 2019-03-07: qty 22

## 2019-03-07 MED ORDER — FENTANYL CITRATE (PF) 100 MCG/2ML IJ SOLN
INTRAMUSCULAR | Status: AC
  Filled 2019-03-07: qty 2

## 2019-03-07 MED ORDER — CEFAZOLIN SODIUM-DEXTROSE 2-4 GM/100ML-% IV SOLN
INTRAVENOUS | Status: AC
  Administered 2019-03-07: 2000 mg
  Filled 2019-03-07: qty 100

## 2019-03-07 MED ORDER — LIDOCAINE HCL 1 % IJ SOLN
INTRAMUSCULAR | Status: AC
  Filled 2019-03-07: qty 40

## 2019-03-07 MED ORDER — CHLORHEXIDINE GLUCONATE CLOTH 2 % EX PADS
6.0000 | MEDICATED_PAD | Freq: Every day | CUTANEOUS | Status: DC
  Administered 2019-03-09 – 2019-03-11 (×2): 6 via TOPICAL

## 2019-03-07 NOTE — Progress Notes (Addendum)
Attempted to complete patient's admission history, however, pt severely SOB on exertion and at rest and unable to answer questions at this time.

## 2019-03-07 NOTE — Plan of Care (Addendum)
  Problem: Education: Goal: Knowledge of General Education information will improve Description: Including pain rating scale, medication(s)/side effects and non-pharmacologic comfort measures Outcome: Progressing   Problem: Coping: Goal: Level of anxiety will decrease Outcome: Progressing   Problem: Pain Managment: Goal: General experience of comfort will improve Outcome: Progressing   

## 2019-03-07 NOTE — Progress Notes (Signed)
CRITICAL VALUE ALERT  Critical Value: + MRSA PCR   Date & Time Notied:  03/07/2019 3:41 AM   Provider Notified: per protocol  Orders Received/Actions taken: protocol

## 2019-03-07 NOTE — Progress Notes (Signed)
HEMATOLOGY-ONCOLOGY PROGRESS NOTE  SUBJECTIVE: Remains very short of breath.  Gets short of breath when speaking.  Reports intermittent chest discomfort.  Denies abdominal pain, nausea, vomiting.  Scheduled to see interventional radiology this morning for thoracentesis.  Oncology History  Malignant neoplasm of overlapping sites of breast ([place])  09/30/2018 Imaging   CT chest with contrast (at [facility]): Impressions: 1.  Dermal thickening of the multiple masses within the left breast as well as a invasive 6 cm mass of the left pectoralis major, compatible with primary breast cancer. 2.  Numerous pulmonary metastases.  Left axillary, retropectoral, supraclavicular, and upper mediastinal metastatic lymphadenopathy.  Mildly enlarged right axillary and supraclavicular lymph nodes may be metastatic. 3.  Indeterminant subcentimeter lucency in the dome of the right liver. 4.  Small left pleural effusion.   10/10/2018 Initial Diagnosis   Breast cancer ([place])   10/17/2018 Imaging   CTA chest: IMPRESSION: 1. No evidence of lobar or more central pulmonary embolus. Nondiagnostic segmental assessment. 2. Moderate left pleural effusion, increased in size from the prior CT with increasing left lung atelectasis. 3. Unchanged appearance of multiple left breast masses including an invasive mass involving the pectoralis major. 4. Unchanged left axillary, left subpectoral, and mediastinal lymphadenopathy. 5. Mild interval enlargement of multiple lung metastases and of right axillary lymph nodes.   10/18/2018 Imaging   MRI brain w/o and w/ contrast: Single 4 mm focus of enhancement within right frontal white matter probably representing metastatic disease. Attention at follow-up is recommended.   10/19/2018 Imaging   CT abdomen/pelvis w/ contrast: IMPRESSION: 1. No CT evidence of metastatic disease involving the abdomen or pelvis. 2. Left pleural effusion associated atelectasis consolidation  with numerous pulmonary masses and nodules on partially imaged. Skin thickening of the left breast. Findings are in keeping with advanced breast malignancy and as seen on recent CT of the chest, 10/17/2018.   10/19/2018 Procedure   US-guided bx of left supraclavicular LN    10/19/2018 Pathology Results   Accession: [id number]  Lymph node, needle/core biopsy, left supraclavicular - METASTATIC CARCINOMA   11/09/2018 - 02/14/2019 Chemotherapy   The patient had pegfilgrastim-cbqv (UDENYCA) injection 6 mg, 6 mg, Subcutaneous, Once, 1 of 1 cycle Administration: 6 mg (11/10/2018) trastuzumab (HERCEPTIN) 900 mg in sodium chloride 0.9 % 250 mL chemo infusion, 966 mg, Intravenous,  Once, 4 of 7 cycles Administration: 900 mg (11/09/2018), 600 mg (12/14/2018), 600 mg (01/04/2019), 600 mg (01/25/2019) DOCEtaxel (TAXOTERE) 170 mg in sodium chloride 0.9 % 250 mL chemo infusion, 75 mg/m2 = 170 mg, Intravenous,  Once, 4 of 7 cycles Dose modification: 60 mg/m2 (original dose 75 mg/m2, Cycle 2, Reason: Dose not tolerated) Administration: 170 mg (11/09/2018), 140 mg (12/14/2018), 140 mg (01/04/2019), 140 mg (01/25/2019) pertuzumab (PERJETA) 840 mg in sodium chloride 0.9 % 250 mL chemo infusion, 840 mg, Intravenous, Once, 4 of 7 cycles Administration: 840 mg (11/09/2018), 420 mg (12/14/2018), 420 mg (01/04/2019), 420 mg (01/25/2019)  for chemotherapy treatment.    02/06/2019 Imaging   CT CAP: IMPRESSION: 1. There is mixed response to treatment, with decreased size of primary left breast mass and pulmonary nodules, however with enlarging lymphadenopathy and new, extensive sclerotic osseous metastatic disease.   2. Primary mass of the posterior left breast, involving the pectoralis major, has significantly decreased in size compared to prior examination, now measuring approximately 3.0 cm, previously at least 6.0 cm (series 2, image 14). There is unchanged, severe skin thickening of the left breast.   3. Numerous bilateral  pulmonary  masses and nodules have generally decreased in size, an index nodule of the right lower lobe measuring 2.0 cm, previously 3.1 cm on measured similarly (series 3, image 66). There has been interval increase in volume of bilateral pleural effusions, with extensive consolidation and volume loss of the left lung, with extensive nodular pleural disease. 4. However, bulky axillary lymph nodes have increased in size compared to prior examination, the largest in the right axilla 4.3 x 2.7 cm, previously 3.3 x 1.5 cm when measured similarly (series 2, image 11). Interval enlargement of supraclavicular and mediastinal lymph nodes, the largest supraclavicular nodes on the left measuring 3.0 x 1.5 cm, previously 2.4 x 1.1 cm (series 2, image 5). Newly enlarged epicardial lymph nodes measuring up to 1.4 x 1.0 cm (series 2, image 37). There are newly enlarged left retroperitoneal lymph nodes measuring up to 1.4 x 1.0 cm (series 2, image 58).   5. There are innumerable new sclerotic osseous metastatic lesions involving the included axial and appendicular skeleton. In retrospect, some of these lesions were likely subtly present on prior CT dated 10/19/2018 and to some extent this change reflects post treatment appearance of metastatic lesions.   02/15/2019 -  Chemotherapy   The patient had ado-trastuzumab emtansine (KADCYLA) 320 mg in sodium chloride 0.9 % 250 mL chemo infusion, 360 mg, Intravenous, Once, 1 of 5 cycles Administration: 320 mg (02/15/2019)  for chemotherapy treatment.       REVIEW OF SYSTEMS:   Constitutional: Denies fevers, chills Respiratory: Reports shortness of breath. Cardiovascular: Reports intermittent chest discomfort Gastrointestinal:  Denies nausea, heartburn or change in bowel habits Skin: Breast wound that is followed by the wound clinic. Lymphatics: Denies new lymphadenopathy or easy bruising Neurological:Denies numbness, tingling or new  weaknesses Behavioral/Psych: Mood is stable, no new changes  Extremities: No lower extremity edema All other systems were reviewed with the patient and are negative.  I have reviewed the past medical history, past surgical history, social history and family history with the patient and they are unchanged from previous note.   PHYSICAL EXAMINATION: ECOG PERFORMANCE STATUS: 1 - Symptomatic but completely ambulatory  Vitals:   03/07/19 0600 03/07/19 0800  BP:  (!) 111/59  Pulse: (!) 116 (!) 113  Resp: 18 (!) 24  Temp:  98.6 F (37 C)  SpO2: 97% 93%   Filed Weights   03/06/19 2032 03/07/19 0500  Weight: 218 lb 14.7 oz (99.3 kg) 221 lb 5.5 oz (100.4 kg)    Intake/Output from previous day: 07/28 0701 - 07/29 0700 In: 490 [P.O.:480; I.V.:10] Out: 1300 [Urine:1300]  GENERAL:alert, appears short of breath EYES: normal, Conjunctiva are pink and non-injected, sclera clear OROPHARYNX: No thrush or mucositis LUNGS: Diminished breath sounds in the lung bases.  Inspiratory rales in the upper lung fields. HEART: Tachycardic, regular rhythm, no lower extremity edema ABDOMEN:abdomen soft, non-tender and normal bowel sounds Musculoskeletal:no cyanosis of digits and no clubbing  NEURO: alert & oriented x 3 with fluent speech, no focal motor/sensory deficits  LABORATORY DATA:  I have reviewed the data as listed CMP Latest Ref Rng & Units 03/07/2019 03/06/2019 02/15/2019  Glucose 70 - 99 mg/dL 96 95 116(H)  BUN 6 - 20 mg/dL 11 10 11   Creatinine 0.44 - 1.00 mg/dL 0.82 0.89 0.65  Sodium 135 - 145 mmol/L 140 142 140  Potassium 3.5 - 5.1 mmol/L 3.9 4.2 4.6  Chloride 98 - 111 mmol/L 98 102 101  CO2 22 - 32 mmol/L 32 29 31  Calcium 8.9 -  10.3 mg/dL 9.0 9.2 9.5  Total Protein 6.5 - 8.1 g/dL 7.6 7.8 7.2  Total Bilirubin 0.3 - 1.2 mg/dL 0.6 0.4 0.4  Alkaline Phos 38 - 126 U/L 75 73 74  AST 15 - 41 U/L 25 24 13(L)  ALT 0 - 44 U/L 13 15 10     Lab Results  Component Value Date   WBC 10.1  03/07/2019   HGB 10.3 (L) 03/07/2019   HCT 36.7 03/07/2019   MCV 90.8 03/07/2019   PLT 474 (H) 03/07/2019   NEUTROABS 9.2 (H) 03/06/2019    Dg Chest 1 View  Result Date: 02/19/2019 CLINICAL DATA:  Status post thoracentesis. History of breast carcinoma EXAM: CHEST  1 VIEW COMPARISON:  Jan 04, 2019 chest radiograph and chest CT angiogram December 07, 2018 FINDINGS: No pneumothorax. Moderate lower lobe consolidation and effusion present on the left. There are multiple mass lesions in the lungs consistent with metastatic foci. Heart size and pulmonary vascular normal. No adenopathy appreciable by radiography. Port-A-Cath tip is at the caval cervical junction. No bone lesions. IMPRESSION: No pneumothorax. Left lower lobe consolidation with left pleural effusion present. Multiple pulmonary metastases noted. The previously noted adenopathy seen on CT examination is not appreciable by radiography. Electronically Signed   By: Lowella Grip III M.D.   On: 02/19/2019 10:20   Ct Chest W Contrast  Result Date: 02/06/2019 CLINICAL DATA:  Follow-up metastatic breast cancer EXAM: CT CHEST, ABDOMEN, AND PELVIS WITH CONTRAST TECHNIQUE: Multidetector CT imaging of the chest, abdomen and pelvis was performed following the standard protocol during bolus administration of intravenous contrast. CONTRAST:  150m OMNIPAQUE IOHEXOL 300 MG/ML SOLN, additional oral enteric contrast COMPARISON:  CT pulmonary angiogram, 12/07/2018, CT chest abdomen pelvis, 10/19/2018, CT pulmonary angiogram, 10/17/2018, CT chest, 09/30/2018 FINDINGS: CT CHEST FINDINGS Cardiovascular: Right internal jugular port catheter. Normal heart size. No pericardial effusion. Mediastinum/Nodes: Bulky axillary lymph nodes have increased in size compared to prior examination, the largest in the right axilla 4.3 x 2.7 cm, previously 3.3 x 1.5 cm when measured similarly (series 2, image 11). Interval enlargement of supraclavicular and mediastinal lymph nodes, the  largest supraclavicular nodes on the left measuring 3.0 x 1.5 cm, previously 2.4 x 1.1 cm (series 2, image 5). Newly enlarged epicardial lymph nodes measuring up to 1.4 x 1.0 cm (series 2, image 37). Thyroid gland, trachea, and esophagus demonstrate no significant findings. Lungs/Pleura: Numerous bilateral pulmonary masses and nodules have generally decreased in size, an index nodule of the right lower lobe measuring 2.0 cm, previously 3.1 cm on measured similarly (series 3, image 66). There has been interval increase in volume of bilateral pleural effusions, with extensive consolidation and volume loss of the left lung, with extensive nodular pleural disease. 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 A previously seen left-sided chest tube has been removed. Musculoskeletal: Primary mass of the posterior left breast, involving the pectoralis major, has significantly decreased in size compared to prior examination, now measuring approximately 3.0 cm, previously at least 6.0 cm (series 2, image 14). There is unchanged, severe skin thickening of the left breast. CT ABDOMEN PELVIS FINDINGS Hepatobiliary: No solid liver abnormality is seen. No gallstones, gallbladder wall thickening, or biliary dilatation. Pancreas: Unremarkable. No pancreatic ductal dilatation or surrounding inflammatory changes. Spleen: Normal in size without significant abnormality. Adrenals/Urinary Tract: Adrenal glands are unremarkable. Kidneys are normal, without renal calculi, solid lesion, or hydronephrosis. Bladder is unremarkable. Stomach/Bowel: Stomach is within normal limits. Appendix appears  normal. No evidence of bowel wall thickening, distention, or inflammatory changes. Occasional sigmoid diverticula. Vascular/Lymphatic: No significant vascular findings are present. There are newly enlarged left retroperitoneal lymph nodes measuring up to 1.4 x 1.0 cm (series 2, image 58). Reproductive:  No mass or other abnormality. Other: No abdominal wall hernia or abnormality. No abdominopelvic ascites. Musculoskeletal: Innumerable new sclerotic osseous metastatic lesions involving the included axial and appendicular skeleton. In retrospect, some of these lesions were likely subtly present on prior CT dated 10/19/2018 (e.g. In the left ilium prior examination series 2, image 62). IMPRESSION: 1. There is mixed response to treatment, with decreased size of primary left breast mass and pulmonary nodules, however with enlarging lymphadenopathy and new, extensive sclerotic osseous metastatic disease. 2. Primary mass of the posterior left breast, involving the pectoralis major, has significantly decreased in size compared to prior examination, now measuring approximately 3.0 cm, previously at least 6.0 cm (series 2, image 14). There is unchanged, severe skin thickening of the left breast. 3. Numerous bilateral pulmonary masses and nodules have generally decreased in size, an index nodule of the right lower lobe measuring 2.0 cm, previously 3.1 cm on measured similarly (series 3, image 66). There has been interval increase in volume of bilateral pleural effusions, with extensive consolidation and volume loss of the left lung, with extensive nodular pleural disease. 4. However, bulky axillary lymph nodes have increased in size compared to prior examination, the largest in the right axilla 4.3 x 2.7 cm, previously 3.3 x 1.5 cm when measured similarly (series 2, image 11). Interval enlargement of supraclavicular and mediastinal lymph nodes, the largest supraclavicular nodes on the left measuring 3.0 x 1.5 cm, previously 2.4 x 1.1 cm (series 2, image 5). Newly enlarged epicardial lymph nodes measuring up to 1.4 x 1.0 cm (series 2, image 37). There are newly enlarged left retroperitoneal lymph nodes measuring up to 1.4 x 1.0 cm (series 2, image 58). 5. There are innumerable new sclerotic osseous metastatic lesions involving  the included axial and appendicular skeleton. In retrospect, some of these lesions were likely subtly present on prior CT dated 10/19/2018 and to some extent this change reflects post treatment appearance of metastatic lesions. Electronically Signed   By: Eddie Candle M.D.   On: 02/06/2019 16:04   Ct Abdomen Pelvis W Contrast  Result Date: 02/06/2019 CLINICAL DATA:  Follow-up metastatic breast cancer EXAM: CT CHEST, ABDOMEN, AND PELVIS WITH CONTRAST TECHNIQUE: Multidetector CT imaging of the chest, abdomen and pelvis was performed following the standard protocol during bolus administration of intravenous contrast. CONTRAST:  164m OMNIPAQUE IOHEXOL 300 MG/ML SOLN, additional oral enteric contrast COMPARISON:  CT pulmonary angiogram, 12/07/2018, CT chest abdomen pelvis, 10/19/2018, CT pulmonary angiogram, 10/17/2018, CT chest, 09/30/2018 FINDINGS: CT CHEST FINDINGS Cardiovascular: Right internal jugular port catheter. Normal heart size. No pericardial effusion. Mediastinum/Nodes: Bulky axillary lymph nodes have increased in size compared to prior examination, the largest in the right axilla 4.3 x 2.7 cm, previously 3.3 x 1.5 cm when measured similarly (series 2, image 11). Interval enlargement of supraclavicular and mediastinal lymph nodes, the largest supraclavicular nodes on the left measuring 3.0 x 1.5 cm, previously 2.4 x 1.1 cm (series 2, image 5). Newly enlarged epicardial lymph nodes measuring up to 1.4 x 1.0 cm (series 2, image 37). Thyroid gland, trachea, and esophagus demonstrate no significant findings. Lungs/Pleura: Numerous bilateral pulmonary masses and nodules have generally decreased in size, an index nodule of the right lower lobe measuring 2.0 cm, previously 3.1 cm on  measured similarly (series 3, image 66). There has been interval increase in volume of bilateral pleural effusions, with extensive consolidation and volume loss of the left lung, with extensive nodular pleural disease. There has  been interval development of early radiation fibrosis of the left lung anteriorly. A previously seen left-sided chest tube has been removed. Musculoskeletal: Primary mass of the posterior left breast, involving the pectoralis major, has significantly decreased in size compared to prior examination, now measuring approximately 3.0 cm, previously at least 6.0 cm (series 2, image 14). There is unchanged, severe skin thickening of the left breast. CT ABDOMEN PELVIS FINDINGS Hepatobiliary: No solid liver abnormality is seen. No gallstones, gallbladder wall thickening, or biliary dilatation. Pancreas: Unremarkable. No pancreatic ductal dilatation or surrounding inflammatory changes. Spleen: Normal in size without significant abnormality. Adrenals/Urinary Tract: Adrenal glands are unremarkable. Kidneys are normal, without renal calculi, solid lesion, or hydronephrosis. Bladder is unremarkable. Stomach/Bowel: Stomach is within normal limits. Appendix appears normal. No evidence of bowel wall thickening, distention, or inflammatory changes. Occasional sigmoid diverticula. Vascular/Lymphatic: No significant vascular findings are present. There are newly enlarged left retroperitoneal lymph nodes measuring up to 1.4 x 1.0 cm (series 2, image 58). Reproductive: No mass or other abnormality. Other: No abdominal wall hernia or abnormality. No abdominopelvic ascites. Musculoskeletal: Innumerable new sclerotic osseous metastatic lesions involving the included axial and appendicular skeleton. In retrospect, some of these lesions were likely subtly present on prior CT dated 10/19/2018 (e.g. In the left ilium prior examination series 2, image 62). IMPRESSION: 1. There is mixed response to treatment, with decreased size of primary left breast mass and pulmonary nodules, however with enlarging lymphadenopathy and new, extensive sclerotic osseous metastatic disease. 2. Primary mass of the posterior left breast, involving the pectoralis  major, has significantly decreased in size compared to prior examination, now measuring approximately 3.0 cm, previously at least 6.0 cm (series 2, image 14). There is unchanged, severe skin thickening of the left breast. 3. Numerous bilateral pulmonary masses and nodules have generally decreased in size, an index nodule of the right lower lobe measuring 2.0 cm, previously 3.1 cm on measured similarly (series 3, image 66). There has been interval increase in volume of bilateral pleural effusions, with extensive consolidation and volume loss of the left lung, with extensive nodular pleural disease. 4. However, bulky axillary lymph nodes have increased in size compared to prior examination, the largest in the right axilla 4.3 x 2.7 cm, previously 3.3 x 1.5 cm when measured similarly (series 2, image 11). Interval enlargement of supraclavicular and mediastinal lymph nodes, the largest supraclavicular nodes on the left measuring 3.0 x 1.5 cm, previously 2.4 x 1.1 cm (series 2, image 5). Newly enlarged epicardial lymph nodes measuring up to 1.4 x 1.0 cm (series 2, image 37). There are newly enlarged left retroperitoneal lymph nodes measuring up to 1.4 x 1.0 cm (series 2, image 58). 5. There are innumerable new sclerotic osseous metastatic lesions involving the included axial and appendicular skeleton. In retrospect, some of these lesions were likely subtly present on prior CT dated 10/19/2018 and to some extent this change reflects post treatment appearance of metastatic lesions. Electronically Signed   By: Eddie Candle M.D.   On: 02/06/2019 16:04   Dg Chest Port 1 View  Result Date: 03/06/2019 CLINICAL DATA:  Abdominal distension with fluid retention and shortness of breath. Stage IV breast cancer. EXAM: PORTABLE CHEST 1 VIEW COMPARISON:  Radiographs 02/19/2019 and 01/04/2019.  CT 12/07/2018. FINDINGS: 1522 hours. Two views obtained.  There are progressively lower lung volumes. The right IJ Port-A-Cath appears  grossly unchanged, tip extending to the right atrial level. There is progressive opacification of left hemithorax due to a combination of left lower lobe consolidation, a loculated pleural effusion and metastatic disease. Multiple nodules in the right lung are again noted. No pneumothorax. Telemetry leads overlie the chest. IMPRESSION: Overall worsening of the lungs with increasing pleuroparenchymal opacities in the left hemithorax, attributed to a combination of metastatic disease, loculated pleural fluid and possible posterior obstructive pneumonia. No significant changes seen in the right lung. Electronically Signed   By: Richardean Sale M.D.   On: 03/06/2019 16:44   Ir Removal Of Plural Cath W/cuff  Result Date: 02/16/2019 INDICATION: Recurrent left pleural effusion s/p Pleurx catheter placement 11/01/2018. Patient has been unable to drain her catheter for the past several weeks. Request is made for removal. EXAM: REMOVAL OF TUNNELED PLEURAL CATHETER MEDICATIONS: None ANESTHESIA/SEDATION: None FLUOROSCOPY TIME:  None COMPLICATIONS: None immediate. PROCEDURE: A sterile gown and gloves were worn during the procedure. Only a short length of catheter remained within the skin so that with removal of the overlying dressing and foam the catheter was then successfully removed in its entirety. A sterile dressing was applied over the catheter exit site. IMPRESSION: Removal of tunneled dialysis catheter utilizing sharp and blunt dissection. Read by: Brynda Greathouse PA-C Electronically Signed   By: Corrie Mckusick D.O.   On: 02/16/2019 13:52   Ir Thoracentesis Asp Pleural Space W/img Guide  Result Date: 02/19/2019 INDICATION: Patient with history of metastatic breast cancer, dyspnea, bilateral pleural effusions, larger on right. Request received for therapeutic right thoracentesis. EXAM: ULTRASOUND GUIDED THERAPEUTIC RIGHT THORACENTESIS MEDICATIONS: None COMPLICATIONS: None immediate. PROCEDURE: An ultrasound guided  thoracentesis was thoroughly discussed with the patient and questions answered. The benefits, risks, alternatives and complications were also discussed. The patient understands and wishes to proceed with the procedure. Written consent was obtained. Ultrasound was performed to localize and mark an adequate pocket of fluid in the right chest. The area was then prepped and draped in the normal sterile fashion. 1% Lidocaine was used for local anesthesia. Under ultrasound guidance a 6 Fr Safe-T-Centesis catheter was introduced. Thoracentesis was performed. The catheter was removed and a dressing applied. FINDINGS: A total of approximately 670 cc of yellow fluid was removed. IMPRESSION: Successful ultrasound guided therapeutic right thoracentesis yielding 670 cc of pleural fluid. Read by: Rowe Robert, PA-C No pneumothorax on follow-up chest radiograph. Electronically Signed   By: Lucrezia Europe M.D.   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  -NCCN guidelines reviewed with the patient in detail; given the rapid disease progression, I discussed with the patient about changing treatment to Kadcyla, including its mechanism of action, efficacy and some of the common side effects -The most common adverse reactions (> 25%) with KADCYLA were fatigue, nausea, musculoskeletal pain, hemorrhage, thrombocytopenia, headache, increased transaminases, constipation and epistaxis -The patient is aware that the response rates discussed earlier is not  guaranteed.   -After a long discussion, patient made an informed decision  to proceed with the prescribed plan of care.  -As the patient has recently changed her insurance to [facility], we have obtained authorization to start [place] on 02/15/2019; she is status post 1 cycle of Kadcyla.  Will need to delay her second cycle secondary to hospitalization. -[id number] monitoring while on Kadcyla, next due in early 02/2019 -Antiemetics-she has PRN Zofran and Ativan available to her  Malignant left pleural effusion  -S/p Pleurx catheter placement in late 10/2018  -Review of the recent CT CAP showed enlarging bilateral pleural effusion, L > R, despite PleurX; the catheter was not visualized on CT, indicating that it likely slipped out of position -Previous Pleurx catheter was removed -She was scheduled for outpatient replacement, but the patient was late for her appointment and this could not be performed.  A thoracentesis was performed instead on 02/19/2019. -She has worsening dyspnea requiring hospitalization.  Scheduled for thoracentesis this morning and will request for IR to place the Pleurx catheter while she is in their department today.  If this cannot be done today, we will plan for Pleurx placement prior to hospital discharge.  Metastasis to the brain  -S/p SBRT to the brain met on 11/08/2018 -Repeat MRI brain scheduled on 03/07/2019 -I have notified radiation oncology of the patient's hospital admission.  Awaiting further guidance from them regarding delaying the scan versus obtaining this while she is hospitalized.  [facility] was requested [facility] during [facility] hospitalization. -Patient denies any new focal neurologic deficits  -We will monitor it for now   Chemotherapy-associated anemia -Secondary to chemotherapy -Hemoglobin 10.3, stable  -Patient denies any symptom of bleeding -Patient has required periodic RBC  transfusion; if anemia persists, we can consider adding Aranesp with chemotherapy  -We will monitor for now  Leukocytosis -Likely reactive in the setting of underlying malignancy -White blood cell count is down to 10.1 today -Patient denies any symptoms of infection, except a mild cough with mostly clear sputum production -We will monitor it for now  Thrombocytosis -Likely reactive in the setting of underlying malignancy -Plts 474k today, stable -We will monitor it for now  Breast wound -Overall improving, followed by wound care center  -Continue wound care as instructed  Cancer-related pain -Secondary to the breast malignancy and catheter placement -On MS-Contin 317mBID w/ PRN oxycodone 1052m6hrs PRN for breakthrough pain -Pain relatively well controlled -Continue pain regimen above     LOS: 1 day   KriMikey BussingNP, AGPCNP-BC, AOCNP 03/07/19

## 2019-03-07 NOTE — Procedures (Signed)
Interventional Radiology Procedure Note  Procedure: Placement of a RIGHT tunneled pleural drain.   Complications: None  Estimated Blood Loss: None  Recommendations: - Routine daily drainage  Signed,  Criselda Peaches, MD

## 2019-03-07 NOTE — Progress Notes (Signed)
PROGRESS NOTE    Sharon Floyd  XBM:[id number] DOB: October 07, 1975 DOA: 03/06/2019 PCP: Baruch Gouty, FNP    Brief Narrative:  43 y.o. female with medical history significant for stage IV metastatic left-sided breast cancer and malignant left pleural effusion who presents to the ED for evaluation of acutely worsening dyspnea.  Patient has chronic respiratory failure with hypoxia requiring 3-4 L supplemental O2 via [place] at all times due to metastatic pulmonary disease and known metastatic left pleural effusion.  She had a Pleurx catheter in place in March 2020 which was removed on 02/16/2019.  She most recently had IR guided thoracentesis of the right side with removal of 670 cc pleural fluid.  She is planned to have repeat thoracentesis today (03/06/2019) at [facility] however had acute worsening of her shortness of breath therefore called EMS.  Her oncologist was contacted and recommended that she be brought to [facility] long ED.  Patient is currently undergoing palliative chemotherapy status post 4 cycles of THP and currently on Kadcyla.  She also has known brain metastases and underwent SBRT on 11/08/2018.  She is scheduled to have repeat MRI brain on 03/07/2019.  She currently reports orthopnea and cough productive of intermittent clear sputum.  She denies any subjective fevers, diaphoresis, chills, or abdominal pain.  She reports good urine output without dysuria.  She denies any constipation or diarrhea.  ED Course:  Initial vitals showed BP 125/92, pulse 114, RR 16, temp 99.5 Fahrenheit, SPO2 100% on nonrebreather.  Patient was transitioned to 4 L supplemental O2 via [place] saturating around 93% per EDP.  Labs notable for WBC 10.9, hemoglobin 10.4, platelets 500,000, sodium 142, potassium 4.2, bicarb 29, BUN 10, creatinine 0.89.  SARS-CoV-2 test was obtained and pending.  Portable chest x-ray showed worsening appearance of both lungs with increasing pleural-parenchymal opacities in the  left hemithorax felt due to combination of metastatic disease, loculated pleural fluid, and possible posterior obstructive pneumonia.  Right IJ Port-A-Cath appears unchanged with tip extending to the right atrial level.  EDP discussed the case with oncology who will see in a.m.  The hospitalist service was consulted to admit for further evaluation and management.   Assessment & Plan:   Principal Problem:   Acute on chronic respiratory failure with hypoxia (HCC) Active Problems:   Malignant pleural effusion   Malignant neoplasm of left female breast (HCC)  Acute on chronic respiratory failure with hypoxia due to pulmonary metastases and malignant pleural effusion: -Worsening opacities of the left hemithorax seen on chest x-ray.  Concern for loculated pleural fluid per radiology read.  Recently had Pleurx catheter removed from left side as patient was unable to drain catheter.  Initially required nonrebreather, now transitioned to nasal cannula. -Continue supplemental oxygen -IR thoracentesis requested. 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 Maylon Peppers.  Currently receiving palliative therapy with Kadcyla. -Oncology following. Defer additional testing to Oncology service  Chronic cancer related pain: -Continue home MS Contin 30 mg every 12 hours and as needed OxyIR 10 mg every 6 hours with hold parameters. -Seems stable at this time  Anxiety: -Continue Ativan 0.5 mg every 6 hours as needed with hold parameters. -Seems stable this AM  DVT prophylaxis: SCD's Code Status: Full Family Communication: Pt in room,family not at bedside Disposition Plan: Uncertain at this time  Consultants:   Oncology  IR  Procedures:  Pleurx cath placement 7/29  Antimicrobials: Anti-infectives (From admission, onward)   Start     Dose/Rate  Route Frequency Ordered Stop   03/07/19 1400  ceFAZolin (ANCEF) 2-4 GM/100ML-% IVPB    Note to Pharmacy: Hilma Favors   : cabinet override      03/07/19 1400 03/07/19 1410       Subjective: Complaining of sob this AM  Objective: Vitals:   03/07/19 1416 03/07/19 1420 03/07/19 1425 03/07/19 1430  BP: (!) 130/49 119/81 (!) 134/120 (!) 146/91  Pulse: (!) 115 (!) 115 (!) 118 (!) 118  Resp: (!) 21 (!) 24 (!) 23 (!) 27  Temp:      TempSrc:      SpO2: 93% 95% 91% 96%  Weight:      Height:        Intake/Output Summary (Last 24 hours) at 03/07/2019 1534 Last data filed at 03/07/2019 0600 Gross per 24 hour  Intake 490 ml  Output 1300 ml  Net -810 ml   Filed Weights   03/06/19 2032 03/07/19 0500  Weight: 99.3 kg 100.4 kg    Examination:  General exam: Appears SOB and uncomfortable Respiratory system: decreased BS, no audible wheezing Cardiovascular system: S1 & S2 heard, RRR. Gastrointestinal system: Abdomen is nondistended, obese Central nervous system: Alert and oriented. No focal neurological deficits. Extremities: Symmetric 5 x 5 power. Skin: No rashes, lesions  Psychiatry: Judgement and insight appear normal. Mood & affect appropriate.   Data Reviewed: I have personally reviewed following labs and imaging studies  CBC: Recent Labs  Lab 03/06/19 1617 03/07/19 0534  WBC 10.9* 10.1  NEUTROABS 9.2*  --   HGB 10.4* 10.3*  HCT 36.8 36.7  MCV 91.1 90.8  PLT 500* 263*   Basic Metabolic Panel: Recent Labs  Lab 03/06/19 1617 03/07/19 0534  NA 142 140  K 4.2 3.9  CL 102 98  CO2 29 32  GLUCOSE 95 96  BUN 10 11  CREATININE 0.89 0.82  CALCIUM 9.2 9.0   GFR: Estimated Creatinine Clearance: 100.9 mL/min (by C-G formula based on SCr of 0.82 mg/dL). Liver Function Tests: Recent Labs  Lab 03/06/19 1617 03/07/19 0534  AST 24 25  ALT 15 13  ALKPHOS 73 75  BILITOT 0.4 0.6  PROT 7.8 7.6  ALBUMIN 3.0* 2.8*   No results for input(s): LIPASE, AMYLASE in the  last 168 hours. No results for input(s): AMMONIA in the last 168 hours. Coagulation Profile: No results for input(s): INR, PROTIME in the last 168 hours. Cardiac Enzymes: No results for input(s): CKTOTAL, CKMB, CKMBINDEX, TROPONINI in the last 168 hours. BNP (last 3 results) No results for input(s): PROBNP in the last 8760 hours. HbA1C: No results for input(s): HGBA1C in the last 72 hours. CBG: No results for input(s): GLUCAP in the last 168 hours. Lipid Profile: No results for input(s): CHOL, HDL, LDLCALC, TRIG, CHOLHDL, LDLDIRECT in the last 72 hours. Thyroid Function Tests: No results for input(s): TSH, T4TOTAL, FREET4, T3FREE, THYROIDAB in the last 72 hours. Anemia Panel: No results for input(s): VITAMINB12, FOLATE, FERRITIN, TIBC, IRON, RETICCTPCT in the last 72 hours. Sepsis Labs: No results for input(s): PROCALCITON, LATICACIDVEN in the last 168 hours.  Recent Results (from the past 240 hour(s))  SARS Coronavirus 2 (CEPHEID - Performed in [facility]), Hosp Order     Status: None   Collection Time: 03/06/19  6:15 PM   Specimen: Nasopharyngeal Swab  Result Value Ref Range Status   SARS Coronavirus 2  NEGATIVE NEGATIVE Final    Comment: (NOTE) If result is NEGATIVE SARS-CoV-2 target nucleic acids are NOT DETECTED. The SARS-CoV-2 RNA is generally detectable in upper and lower  respiratory specimens during the acute phase of infection. The lowest  concentration of SARS-CoV-2 viral copies this assay can detect is 250  copies / mL. A negative result does not preclude SARS-CoV-2 infection  and should not be used as the sole basis for treatment or other  patient management decisions.  A negative result may occur with  improper specimen collection / handling, submission of specimen other  than nasopharyngeal swab, presence of viral mutation(s) within the  areas targeted by this assay, and inadequate number of viral copies  (<250 copies / mL). A negative result must be  combined with clinical  observations, patient history, and epidemiological information. If result is POSITIVE SARS-CoV-2 target nucleic acids are DETECTED. The SARS-CoV-2 RNA is generally detectable in upper and lower  respiratory specimens dur ing the acute phase of infection.  Positive  results are indicative of active infection with SARS-CoV-2.  Clinical  correlation with patient history and other diagnostic information is  necessary to determine patient infection status.  Positive results do  not rule out bacterial infection or co-infection with other viruses. If result is PRESUMPTIVE POSTIVE SARS-CoV-2 nucleic acids MAY BE PRESENT.   A presumptive positive result was obtained on the submitted specimen  and confirmed on repeat testing.  While 2019 novel coronavirus  (SARS-CoV-2) nucleic acids may be present in the submitted sample  additional confirmatory testing may be necessary for epidemiological  and / or clinical management purposes  to differentiate between  SARS-CoV-2 and other Sarbecovirus currently known to infect humans.  If clinically indicated additional testing with an alternate test  methodology [phone]) is advised. The SARS-CoV-2 RNA is generally  detectable in upper and lower respiratory sp ecimens during the acute  phase of infection. The expected result is Negative. Fact Sheet for Patients:  StrictlyIdeas.no Fact Sheet for Healthcare Providers: BankingDealers.co.za This test is not yet approved or cleared by the [place] FDA and has been authorized for detection and/or diagnosis of SARS-CoV-2 by FDA under an Emergency Use Authorization (EUA).  This EUA will remain in effect (meaning this test can be used) for the duration of the COVID-19 declaration under Section 564(b)(1) of the Act, 21 U.S.C. section 360bbb-3(b)(1), unless the authorization is terminated or revoked sooner. Performed at [facility], [place] [address], [place], [place] [address]   MRSA PCR Screening     Status: Abnormal   Collection Time: 03/06/19  8:39 PM   Specimen: Nasal Mucosa; Nasopharyngeal  Result Value Ref Range Status   MRSA by PCR POSITIVE (A) NEGATIVE Final    Comment:        The GeneXpert MRSA Assay (FDA approved for NASAL specimens only), is one component of a comprehensive MRSA colonization surveillance program. It is not intended to diagnose MRSA infection nor to guide or monitor treatment for MRSA infections. RESULT CALLED TO, READ BACK BY AND VERIFIED WITH: HOPPER,M @ 0337 ON [id number] BY POTEAT,S Performed at [facility] [address] [place], [place] [address]      Radiology Studies: Dg Chest Port 1 View  Result Date: 03/06/2019 CLINICAL DATA:  Abdominal distension with fluid retention and shortness of breath. Stage IV breast cancer. EXAM: PORTABLE CHEST 1 VIEW COMPARISON:  Radiographs 02/19/2019 and 01/04/2019.  CT 12/07/2018. FINDINGS: 1522 hours. Two views obtained. There are progressively  lower lung volumes. The right IJ Port-A-Cath appears grossly unchanged, tip extending to the right atrial level. There is progressive opacification of left hemithorax due to a combination of left lower lobe consolidation, a loculated pleural effusion and metastatic disease. Multiple nodules in the right lung are again noted. No pneumothorax. Telemetry leads overlie the chest. IMPRESSION: Overall worsening of the lungs with increasing pleuroparenchymal opacities in the left hemithorax, attributed to a combination of metastatic disease, loculated pleural fluid and possible posterior obstructive pneumonia. No significant changes seen in the right lung. Electronically Signed   By: Richardean Sale M.D.   On: 03/06/2019 16:44    Scheduled Meds: . Chlorhexidine Gluconate Cloth  6 each Topical Daily  . Chlorhexidine Gluconate Cloth  6 each Topical Q0600  . fentaNYL      . gabapentin   600 mg Oral BID  . guaiFENesin  600 mg Oral BID  . lidocaine      . mouth rinse  15 mL Mouth Rinse BID  . midazolam      . morphine  30 mg Oral Q12H  . mupirocin ointment  1 application Nasal BID  . sodium chloride flush  3 mL Intravenous Q12H   Continuous Infusions:   LOS: 1 day   Marylu Lund, MD [facility] Pager On Amion  If 7PM-7AM, please contact night-coverage 03/07/2019, 3:34 PM

## 2019-03-07 NOTE — Consult Note (Signed)
Chief Complaint: Patient was seen in consultation today for right pleural effusion  Referring Physician(s): Mikey Bussing, NP  Supervising Physician: Jacqulynn Cadet  Patient Status: [facility] - In-pt  History of Present Illness: Sharon Floyd is a 43 y.o. 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  She is known to IR from recurrent pleural effusions requiring frequent thoracenteses. She underwent PleurX placement on the left 11/01/18.  Tube was found to be dislodged earlier this month and was removed 7/10.  It was not replaced due to decreased fluid accumulation with increased disease burden on the left.  She presented to IR 02/19/19 for right thoracentesis.  She is currently admitted to [facility] with acute dyspnea.  She is found to have a recurrent right-sided pleural effusion and request is made for right PleurX placement vs. Thoracentesis.   Patient approved for right PleurX by Dr. Laurence Ferrari.  She has been NPO today.   Past Medical History:  Diagnosis Date   Arthritis    Cancer [facility])    breast   Family history of breast cancer    Family history of colon cancer     Past Surgical History:  Procedure Laterality Date   IR IMAGING GUIDED PORT INSERTION  10/19/2018   IR PERC PLEURAL DRAIN W/INDWELL CATH W/IMG GUIDE  11/01/2018   IR REMOVAL OF PLURAL CATH W/CUFF  02/16/2019   IR SINUS/FIST TUBE CHK-NON GI  12/06/2018   IR THORACENTESIS ASP PLEURAL SPACE W/IMG GUIDE  02/19/2019   IR US GUIDE BX ASP/DRAIN  10/19/2018   PATELLA REALIGNMENT Left 1992    Allergies: Aspirin  Medications: Prior to Admission medications   Medication Sig Start Date End Date Taking? Authorizing Provider  gabapentin (NEURONTIN) 300 MG capsule TAKE 2 CAPSULES (600 MG TOTAL) BY MOUTH 2 (TWO) TIMES DAILY. 01/08/19  Yes Tish Men, MD  guaiFENesin (MUCINEX) 600 MG 12 hr tablet Take 600 mg by mouth 2 (two) times daily.   Yes [provider]  LORazepam  (ATIVAN) 0.5 MG tablet Take 1 tablet (0.5 mg total) by mouth every 6 (six) hours as needed (Nausea or vomiting). 11/03/18  Yes Tish Men, MD  morphine (MS CONTIN) 30 MG 12 hr tablet Take 1 tablet (30 mg total) by mouth every 12 (twelve) hours. 02/07/19  Yes Tish Men, MD  ondansetron (ZOFRAN) 8 MG tablet Take 1 tablet (8 mg total) by mouth 2 (two) times daily as needed for refractory nausea / vomiting. 11/03/18  Yes Tish Men, MD  oxyCODONE (OXY IR/ROXICODONE) 5 MG immediate release tablet Take 2 tablets (10 mg total) by mouth every 6 (six) hours as needed for severe pain. 02/07/19 03/09/19 Yes Tish Men, MD  prochlorperazine (COMPAZINE) 10 MG tablet Take 1 tablet (10 mg total) by mouth every 6 (six) hours as needed (Nausea or vomiting). 11/03/18  Yes Tish Men, MD  ado-trastuzumab emtansine 360 mg in sodium chloride 0.9 % 250 mL Inject 360 mg into the vein every 21 ( twenty-one) days for 12 doses. Patient not taking: Reported on 03/06/2019 02/08/19 09/28/19  Tish Men, MD  dexamethasone (DECADRON) 4 MG tablet Take 2 tablets (8 mg total) by mouth 2 (two) times daily. Start the day before Taxotere. Then daily after chemo for 2 days. Patient not taking: Reported on 03/06/2019 11/03/18   Tish Men, MD  lidocaine-prilocaine (EMLA) cream Apply to affected area once 11/03/18   Tish Men, MD  UNABLE TO FIND As per Medical necessity, Mastectomy Bra Q#2-Dx  Code C50.812, Z17.1 Malignant neoplasm of overlapping sites of left breast in female, estrogen receptor negative 02/15/19   Tish Men, MD     Family History  Problem Relation Age of Onset   Arthritis Mother    Diabetes Father    Hypertension Father    Arthritis Father    Heart disease Father    Early death Maternal Grandmother    Asthma Maternal Grandfather    Heart disease Maternal Grandfather    Migraines Maternal Grandfather    Early death Maternal Grandfather    Asthma Paternal Grandmother    Hypertension Paternal Grandmother    Asthma Paternal  Grandfather    Hypertension Paternal Grandfather    Breast cancer Other        paternal cousin, female   Colon cancer Cousin        paternal   Breast cancer Cousin        paternal   Lung cancer Other     Social History   Socioeconomic History   Marital status: Single    Spouse name: Not on file   Number of children: Not on file   Years of education: Not on file   Highest education level: Not on file  Occupational History   Not on file  Social Needs   Financial resource strain: Not on file   Food insecurity    Worry: Not on file    Inability: Not on file   Transportation needs    Medical: No    Non-medical: No  Tobacco Use   Smoking status: Former Smoker    Types: Cigarettes    Quit date: 10/09/2018    Years since quitting: 0.4   Smokeless tobacco: Never Used  Substance and Sexual Activity   Alcohol use: Not Currently   Drug use: Never   Sexual activity: Not Currently  Lifestyle   Physical activity    Days per week: Not on file    Minutes per session: Not on file   Stress: Not on file  Relationships   Social connections    Talks on phone: Not on file    Gets together: Not on file    Attends religious service: Not on file    Active member of club or organization: Not on file    Attends meetings of clubs or organizations: Not on file    Relationship status: Not on file  Other Topics Concern   Not on file  Social History Narrative   Not on file     Review of Systems: A 12 point ROS discussed and pertinent positives are indicated in the HPI above.  All other systems are negative.  Review of Systems  Constitutional: Negative for fatigue and fever.  Respiratory: Positive for shortness of breath.   Cardiovascular: Negative for chest pain.  Gastrointestinal: Negative for abdominal pain, nausea and vomiting.  Musculoskeletal: Negative for back pain.  Psychiatric/Behavioral: Negative for behavioral problems and confusion.    Vital  Signs: BP (!) 126/111 (BP Location: Right Arm)    Pulse 91    Temp 98.3 F (36.8 C) (Oral)    Resp 16    Ht 5\' 4"  (1.626 m)    Wt 221 lb 5.5 oz (100.4 kg)    SpO2 98%    BMI 37.99 kg/m   Physical Exam Vitals signs and nursing note reviewed.  Constitutional:      Appearance: She is well-developed.  Cardiovascular:     Rate and Rhythm: Normal rate and regular rhythm.  Skin:    General: Skin is warm.  Neurological:     General: No focal deficit present.     Mental Status: She is alert and oriented to person, place, and time.  Psychiatric:        Mood and Affect: Mood normal.        Behavior: Behavior normal.      MD Evaluation Airway: WNL Heart: WNL Abdomen: WNL Chest/ Lungs: Other (comments) Chest/ lungs comments: metastatic lung disease ASA  Classification: 3 Mallampati/Airway Score: Three   Imaging: Dg Chest 1 View  Result Date: 02/19/2019 CLINICAL DATA:  Status post thoracentesis. History of breast carcinoma EXAM: CHEST  1 VIEW COMPARISON:  Jan 04, 2019 chest radiograph and chest CT angiogram December 07, 2018 FINDINGS: No pneumothorax. Moderate lower lobe consolidation and effusion present on the left. There are multiple mass lesions in the lungs consistent with metastatic foci. Heart size and pulmonary vascular normal. No adenopathy appreciable by radiography. Port-A-Cath tip is at the caval cervical junction. No bone lesions. IMPRESSION: No pneumothorax. Left lower lobe consolidation with left pleural effusion present. Multiple pulmonary metastases noted. The previously noted adenopathy seen on CT examination is not appreciable by radiography. Electronically Signed   By: Lowella Grip III M.D.   On: 02/19/2019 10:20   Ct Chest W Contrast  Result Date: 02/06/2019 CLINICAL DATA:  Follow-up metastatic breast cancer EXAM: CT CHEST, ABDOMEN, AND PELVIS WITH CONTRAST TECHNIQUE: Multidetector CT imaging of the chest, abdomen and pelvis was performed following the standard  protocol during bolus administration of intravenous contrast. CONTRAST:  130mL OMNIPAQUE IOHEXOL 300 MG/ML SOLN, additional oral enteric contrast COMPARISON:  CT pulmonary angiogram, 12/07/2018, CT chest abdomen pelvis, 10/19/2018, CT pulmonary angiogram, 10/17/2018, CT chest, 09/30/2018 FINDINGS: CT CHEST FINDINGS Cardiovascular: Right internal jugular port catheter. Normal heart size. No pericardial effusion. Mediastinum/Nodes: Bulky axillary lymph nodes have increased in size compared to prior examination, the largest in the right axilla 4.3 x 2.7 cm, previously 3.3 x 1.5 cm when measured similarly (series 2, image 11). Interval enlargement of supraclavicular and mediastinal lymph nodes, the largest supraclavicular nodes on the left measuring 3.0 x 1.5 cm, previously 2.4 x 1.1 cm (series 2, image 5). Newly enlarged epicardial lymph nodes measuring up to 1.4 x 1.0 cm (series 2, image 37). Thyroid gland, trachea, and esophagus demonstrate no significant findings. Lungs/Pleura: Numerous bilateral pulmonary masses and nodules have generally decreased in size, an index nodule of the right lower lobe measuring 2.0 cm, previously 3.1 cm on measured similarly (series 3, image 66). There has been interval increase in volume of bilateral pleural effusions, with extensive consolidation and volume loss of the left lung, with extensive nodular pleural disease. 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 A previously seen left-sided chest tube has been removed. Musculoskeletal: Primary mass of the posterior left breast, involving the pectoralis major, has significantly decreased in size compared to prior examination, now measuring approximately 3.0 cm, previously at least 6.0 cm (series 2, image 14). There is unchanged, severe skin thickening of the left breast. CT ABDOMEN PELVIS FINDINGS Hepatobiliary: No solid liver abnormality is seen. No gallstones, gallbladder wall  thickening, or biliary dilatation. Pancreas: Unremarkable. No pancreatic ductal dilatation or surrounding inflammatory changes. Spleen: Normal in size without significant abnormality. Adrenals/Urinary Tract: Adrenal glands are unremarkable. Kidneys are normal, without renal calculi, solid lesion, or hydronephrosis. Bladder is unremarkable. Stomach/Bowel: Stomach is within normal limits. Appendix appears normal. No evidence of bowel wall  thickening, distention, or inflammatory changes. Occasional sigmoid diverticula. Vascular/Lymphatic: No significant vascular findings are present. There are newly enlarged left retroperitoneal lymph nodes measuring up to 1.4 x 1.0 cm (series 2, image 58). Reproductive: No mass or other abnormality. Other: No abdominal wall hernia or abnormality. No abdominopelvic ascites. Musculoskeletal: Innumerable new sclerotic osseous metastatic lesions involving the included axial and appendicular skeleton. In retrospect, some of these lesions were likely subtly present on prior CT dated 10/19/2018 (e.g. In the left ilium prior examination series 2, image 62). IMPRESSION: 1. There is mixed response to treatment, with decreased size of primary left breast mass and pulmonary nodules, however with enlarging lymphadenopathy and new, extensive sclerotic osseous metastatic disease. 2. Primary mass of the posterior left breast, involving the pectoralis major, has significantly decreased in size compared to prior examination, now measuring approximately 3.0 cm, previously at least 6.0 cm (series 2, image 14). There is unchanged, severe skin thickening of the left breast. 3. Numerous bilateral pulmonary masses and nodules have generally decreased in size, an index nodule of the right lower lobe measuring 2.0 cm, previously 3.1 cm on measured similarly (series 3, image 66). There has been interval increase in volume of bilateral pleural effusions, with extensive consolidation and volume loss of the left  lung, with extensive nodular pleural disease. 4. However, bulky axillary lymph nodes have increased in size compared to prior examination, the largest in the right axilla 4.3 x 2.7 cm, previously 3.3 x 1.5 cm when measured similarly (series 2, image 11). Interval enlargement of supraclavicular and mediastinal lymph nodes, the largest supraclavicular nodes on the left measuring 3.0 x 1.5 cm, previously 2.4 x 1.1 cm (series 2, image 5). Newly enlarged epicardial lymph nodes measuring up to 1.4 x 1.0 cm (series 2, image 37). There are newly enlarged left retroperitoneal lymph nodes measuring up to 1.4 x 1.0 cm (series 2, image 58). 5. There are innumerable new sclerotic osseous metastatic lesions involving the included axial and appendicular skeleton. In retrospect, some of these lesions were likely subtly present on prior CT dated 10/19/2018 and to some extent this change reflects post treatment appearance of metastatic lesions. Electronically Signed   By: Eddie Candle M.D.   On: 02/06/2019 16:04   Ct Abdomen Pelvis W Contrast  Result Date: 02/06/2019 CLINICAL DATA:  Follow-up metastatic breast cancer EXAM: CT CHEST, ABDOMEN, AND PELVIS WITH CONTRAST TECHNIQUE: Multidetector CT imaging of the chest, abdomen and pelvis was performed following the standard protocol during bolus administration of intravenous contrast. CONTRAST:  130mL OMNIPAQUE IOHEXOL 300 MG/ML SOLN, additional oral enteric contrast COMPARISON:  CT pulmonary angiogram, 12/07/2018, CT chest abdomen pelvis, 10/19/2018, CT pulmonary angiogram, 10/17/2018, CT chest, 09/30/2018 FINDINGS: CT CHEST FINDINGS Cardiovascular: Right internal jugular port catheter. Normal heart size. No pericardial effusion. Mediastinum/Nodes: Bulky axillary lymph nodes have increased in size compared to prior examination, the largest in the right axilla 4.3 x 2.7 cm, previously 3.3 x 1.5 cm when measured similarly (series 2, image 11). Interval enlargement of supraclavicular  and mediastinal lymph nodes, the largest supraclavicular nodes on the left measuring 3.0 x 1.5 cm, previously 2.4 x 1.1 cm (series 2, image 5). Newly enlarged epicardial lymph nodes measuring up to 1.4 x 1.0 cm (series 2, image 37). Thyroid gland, trachea, and esophagus demonstrate no significant findings. Lungs/Pleura: Numerous bilateral pulmonary masses and nodules have generally decreased in size, an index nodule of the right lower lobe measuring 2.0 cm, previously 3.1 cm on measured similarly (series 3, image 66).  There has been interval increase in volume of bilateral pleural effusions, with extensive consolidation and volume loss of the left lung, with extensive nodular pleural disease. 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 A previously seen left-sided chest tube has been removed. Musculoskeletal: Primary mass of the posterior left breast, involving the pectoralis major, has significantly decreased in size compared to prior examination, now measuring approximately 3.0 cm, previously at least 6.0 cm (series 2, image 14). There is unchanged, severe skin thickening of the left breast. CT ABDOMEN PELVIS FINDINGS Hepatobiliary: No solid liver abnormality is seen. No gallstones, gallbladder wall thickening, or biliary dilatation. Pancreas: Unremarkable. No pancreatic ductal dilatation or surrounding inflammatory changes. Spleen: Normal in size without significant abnormality. Adrenals/Urinary Tract: Adrenal glands are unremarkable. Kidneys are normal, without renal calculi, solid lesion, or hydronephrosis. Bladder is unremarkable. Stomach/Bowel: Stomach is within normal limits. Appendix appears normal. No evidence of bowel wall thickening, distention, or inflammatory changes. Occasional sigmoid diverticula. Vascular/Lymphatic: No significant vascular findings are present. There are newly enlarged left retroperitoneal lymph nodes measuring up to 1.4 x 1.0 cm  (series 2, image 58). Reproductive: No mass or other abnormality. Other: No abdominal wall hernia or abnormality. No abdominopelvic ascites. Musculoskeletal: Innumerable new sclerotic osseous metastatic lesions involving the included axial and appendicular skeleton. In retrospect, some of these lesions were likely subtly present on prior CT dated 10/19/2018 (e.g. In the left ilium prior examination series 2, image 62). IMPRESSION: 1. There is mixed response to treatment, with decreased size of primary left breast mass and pulmonary nodules, however with enlarging lymphadenopathy and new, extensive sclerotic osseous metastatic disease. 2. Primary mass of the posterior left breast, involving the pectoralis major, has significantly decreased in size compared to prior examination, now measuring approximately 3.0 cm, previously at least 6.0 cm (series 2, image 14). There is unchanged, severe skin thickening of the left breast. 3. Numerous bilateral pulmonary masses and nodules have generally decreased in size, an index nodule of the right lower lobe measuring 2.0 cm, previously 3.1 cm on measured similarly (series 3, image 66). There has been interval increase in volume of bilateral pleural effusions, with extensive consolidation and volume loss of the left lung, with extensive nodular pleural disease. 4. However, bulky axillary lymph nodes have increased in size compared to prior examination, the largest in the right axilla 4.3 x 2.7 cm, previously 3.3 x 1.5 cm when measured similarly (series 2, image 11). Interval enlargement of supraclavicular and mediastinal lymph nodes, the largest supraclavicular nodes on the left measuring 3.0 x 1.5 cm, previously 2.4 x 1.1 cm (series 2, image 5). Newly enlarged epicardial lymph nodes measuring up to 1.4 x 1.0 cm (series 2, image 37). There are newly enlarged left retroperitoneal lymph nodes measuring up to 1.4 x 1.0 cm (series 2, image 58). 5. There are innumerable new sclerotic  osseous metastatic lesions involving the included axial and appendicular skeleton. In retrospect, some of these lesions were likely subtly present on prior CT dated 10/19/2018 and to some extent this change reflects post treatment appearance of metastatic lesions. Electronically Signed   By: Eddie Candle M.D.   On: 02/06/2019 16:04   Dg Chest Port 1 View  Result Date: 03/06/2019 CLINICAL DATA:  Abdominal distension with fluid retention and shortness of breath. Stage IV breast cancer. EXAM: PORTABLE CHEST 1 VIEW COMPARISON:  Radiographs 02/19/2019 and 01/04/2019.  CT 12/07/2018. FINDINGS: 1522 hours. Two views obtained. There are progressively lower lung volumes.  The right IJ Port-A-Cath appears grossly unchanged, tip extending to the right atrial level. There is progressive opacification of left hemithorax due to a combination of left lower lobe consolidation, a loculated pleural effusion and metastatic disease. Multiple nodules in the right lung are again noted. No pneumothorax. Telemetry leads overlie the chest. IMPRESSION: Overall worsening of the lungs with increasing pleuroparenchymal opacities in the left hemithorax, attributed to a combination of metastatic disease, loculated pleural fluid and possible posterior obstructive pneumonia. No significant changes seen in the right lung. Electronically Signed   By: Richardean Sale M.D.   On: 03/06/2019 16:44   Ir Removal Of Plural Cath W/cuff  Result Date: 02/16/2019 INDICATION: Recurrent left pleural effusion s/p Pleurx catheter placement 11/01/2018. Patient has been unable to drain her catheter for the past several weeks. Request is made for removal. EXAM: REMOVAL OF TUNNELED PLEURAL CATHETER MEDICATIONS: None ANESTHESIA/SEDATION: None FLUOROSCOPY TIME:  None COMPLICATIONS: None immediate. PROCEDURE: A sterile gown and gloves were worn during the procedure. Only a short length of catheter remained within the skin so that with removal of the overlying  dressing and foam the catheter was then successfully removed in its entirety. A sterile dressing was applied over the catheter exit site. IMPRESSION: Removal of tunneled dialysis catheter utilizing sharp and blunt dissection. Read by: Brynda Greathouse PA-C Electronically Signed   By: Corrie Mckusick D.O.   On: 02/16/2019 13:52   Ir Thoracentesis Asp Pleural Space W/img Guide  Result Date: 02/19/2019 INDICATION: Patient with history of metastatic breast cancer, dyspnea, bilateral pleural effusions, larger on right. Request received for therapeutic right thoracentesis. EXAM: ULTRASOUND GUIDED THERAPEUTIC RIGHT THORACENTESIS MEDICATIONS: None COMPLICATIONS: None immediate. PROCEDURE: An ultrasound guided thoracentesis was thoroughly discussed with the patient and questions answered. The benefits, risks, alternatives and complications were also discussed. The patient understands and wishes to proceed with the procedure. Written consent was obtained. Ultrasound was performed to localize and mark an adequate pocket of fluid in the right chest. The area was then prepped and draped in the normal sterile fashion. 1% Lidocaine was used for local anesthesia. Under ultrasound guidance a 6 Fr Safe-T-Centesis catheter was introduced. Thoracentesis was performed. The catheter was removed and a dressing applied. FINDINGS: A total of approximately 670 cc of yellow fluid was removed. IMPRESSION: Successful ultrasound guided therapeutic right thoracentesis yielding 670 cc of pleural fluid. Read by: Rowe Robert, PA-C No pneumothorax on follow-up chest radiograph. Electronically Signed   By: Lucrezia Europe M.D.   On: 02/19/2019 10:04    Labs:  CBC: Recent Labs    01/25/19 1000 02/15/19 1000 03/06/19 1617 03/07/19 0534  WBC 16.6* 14.3* 10.9* 10.1  HGB 8.7* 8.9* 10.4* 10.3*  HCT 29.4* 30.5* 36.8 36.7  PLT 561* 530* 500* 474*    COAGS: Recent Labs    10/17/18 1612 11/01/18 1408  INR 0.9 0.9    BMP: Recent Labs     01/25/19 1000 02/15/19 1000 03/06/19 1617 03/07/19 0534  NA 138 140 142 140  K 4.5 4.6 4.2 3.9  CL 100 101 102 98  CO2 27 31 29  32  GLUCOSE 121* 116* 95 96  BUN 11 11 10 11   CALCIUM 10.1 9.5 9.2 9.0  CREATININE 0.72 0.65 0.89 0.82  GFRNONAA >60 >60 >60 >60  GFRAA >60 >60 >60 >60    LIVER FUNCTION TESTS: Recent Labs    01/25/19 1000 02/15/19 1000 03/06/19 1617 03/07/19 0534  BILITOT 0.4 0.4 0.4 0.6  AST 14* 13* 24 25  ALT 12 10 15 13   ALKPHOS 86 74 73 75  PROT 7.5 7.2 7.8 7.6  ALBUMIN 3.9 3.7 3.0* 2.8*    TUMOR MARKERS: No results for input(s): AFPTM, CEA, CA199, CHROMGRNA in the last 8760 hours.  Assessment and Plan: Shortness of breath, recurrent right pleural effusion Patient sleepy but arousable during visit today.  Was brought down to [place] for right thoracentesis today, however returned to unit for assessment of PleurX instead.  Patient with previous PleurX on left which was removed 7/10.  She understands procedure and is agreeable to placement.   Risks and benefits discussed with the patient including bleeding, infection, damage to adjacent structures.  All of the patient's questions were answered, patient is agreeable to proceed. Consent signed and in chart.  Thank you for this interesting consult.  I greatly enjoyed meeting Gailyn Crook and look forward to participating in their care.  A copy of this report was sent to the requesting provider on this date.  Electronically Signed: Docia Barrier, PA 03/07/2019, 1:24 PM   I spent a total of    25 Minutes in face to face in clinical consultation, greater than 50% of which was counseling/coordinating care for right pleural effusion, metastatic breast cancer.

## 2019-03-07 NOTE — Consult Note (Addendum)
[facility] Nurse wound consult note Reason for Consult: Consult requested for left breast wounds.  Pt has breast cancer and chronic full thickness wounds to these locations.  She is followed by the outpatient wound care center and has been using Medihoney and Aquacel to the sites.  Informed patient that Medihoney is not available in the [place], and we will continue use of Aquacel. Wound type: several full thickness wounds to left upper and lower breast: upper .3X.3X.2cm, 5X1X.3cm, lower .3X.3X.2cm.  All locations are yellow slough with mod amt tan drainage, no odor.  Indurated skin surrounding with red raised lesions in patchy areas, breast tissue is hard to palpation. Dressing procedure/placement/frequency: Continue with present plan of care with Aquacel every other day to provide antimicrobial benefits and absorb drainage.  Foam dressings to protect from further injury.  Pt should resume use of Medihoney and followup with the outpatient wound clinic after discharge. Please re-consult if further assistance is needed.  Thank-you,  Julien Girt MSN, [place], [place], [place], [place]

## 2019-03-08 ENCOUNTER — Ambulatory Visit: Payer: Self-pay

## 2019-03-08 ENCOUNTER — Inpatient Hospital Stay (HOSPITAL_COMMUNITY): Payer: BC Managed Care – PPO

## 2019-03-08 ENCOUNTER — Other Ambulatory Visit: Payer: Self-pay

## 2019-03-08 ENCOUNTER — Ambulatory Visit: Payer: Self-pay | Admitting: Hematology

## 2019-03-08 DIAGNOSIS — J90 Pleural effusion, not elsewhere classified: Secondary | ICD-10-CM

## 2019-03-08 LAB — BASIC METABOLIC PANEL
Anion gap: 10 (ref 5–15)
BUN: 12 mg/dL (ref 6–20)
CO2: 30 mmol/L (ref 22–32)
Calcium: 8.8 mg/dL — ABNORMAL LOW (ref 8.9–10.3)
Chloride: 98 mmol/L (ref 98–111)
Creatinine, Ser: 0.86 mg/dL (ref 0.44–1.00)
GFR calc Af Amer: >60 mL/min (ref >60)
GFR calc non Af Amer: >60 mL/min (ref >60)
Glucose, Bld: 110 mg/dL — ABNORMAL HIGH (ref 70–99)
Potassium: 3.5 mmol/L (ref 3.5–5.1)
Sodium: 138 mmol/L (ref 135–145)

## 2019-03-08 LAB — CBC
HCT: 34.7 % — ABNORMAL LOW (ref 36.0–46.0)
Hemoglobin: 9.9 g/dL — ABNORMAL LOW (ref 12.0–15.0)
MCH: 26 pg (ref 26.0–34.0)
MCHC: 28.5 g/dL — ABNORMAL LOW (ref 30.0–36.0)
MCV: 91.1 fL (ref 80.0–100.0)
Platelets: 422 10*3/uL — ABNORMAL HIGH (ref 150–400)
RBC: 3.81 MIL/uL — ABNORMAL LOW (ref 3.87–5.11)
RDW: 19.3 % — ABNORMAL HIGH (ref 11.5–15.5)
WBC: 8.8 10*3/uL (ref 4.0–10.5)
nRBC: 0 % (ref 0.0–0.2)

## 2019-03-08 MED ORDER — LABETALOL HCL 5 MG/ML IV SOLN: 5.0000 mg | INTRAVENOUS | Status: DC | PRN

## 2019-03-08 MED ORDER — METOPROLOL TARTRATE 12.5 MG HALF TABLET
12.5000 mg | ORAL_TABLET | Freq: Two times a day (BID) | ORAL | Status: DC
  Administered 2019-03-09 – 2019-03-11 (×4): 12.5 mg via ORAL
  Filled 2019-03-08 (×7): qty 1

## 2019-03-08 NOTE — Progress Notes (Signed)
IR requested by Mikey Bussing, NP for possible image-guided left thoracentesis.  Limited chest US revealed loculated fluid not amendable to percutaneous drainage. Images sent to Dr. Vernard Gambles for review. Informed patient that procedure will not occur today. All questions answered and concerns addressed. Dr. Wyline Copas made aware.  IR available in future if needed.   Bea Graff Louk, PA-C 03/08/2019, 4:15 PM

## 2019-03-08 NOTE — Progress Notes (Signed)
PROGRESS NOTE    Sharon Floyd  DQQ:[id number] DOB: [birth year] DOA: 03/06/2019 PCP: Baruch Gouty, FNP    Brief Narrative:  43 y.o. female with medical history significant for stage IV metastatic left-sided breast cancer and malignant left pleural effusion who presents to the ED for evaluation of acutely worsening dyspnea.  Patient has chronic respiratory failure with hypoxia requiring 3-4 L supplemental O2 via [place] at all times due to metastatic pulmonary disease and known metastatic left pleural effusion.  She had a Pleurx catheter in place in March 2020 which was removed on 02/16/2019.  She most recently had IR guided thoracentesis of the right side with removal of 670 cc pleural fluid.  She is planned to have repeat thoracentesis today (03/06/2019) at [facility] however had acute worsening of her shortness of breath therefore called EMS.  Her oncologist was contacted and recommended that she be brought to [facility] long ED.  Patient is currently undergoing palliative chemotherapy status post 4 cycles of THP and currently on Kadcyla.  She also has known brain metastases and underwent SBRT on 11/08/2018.  She is scheduled to have repeat MRI brain on 03/07/2019.  She currently reports orthopnea and cough productive of intermittent clear sputum.  She denies any subjective fevers, diaphoresis, chills, or abdominal pain.  She reports good urine output without dysuria.  She denies any constipation or diarrhea.  ED Course:  Initial vitals showed BP 125/92, pulse 114, RR 16, temp 99.5 Fahrenheit, SPO2 100% on nonrebreather.  Patient was transitioned to 4 L supplemental O2 via [place] saturating around 93% per EDP.  Labs notable for WBC 10.9, hemoglobin 10.4, platelets 500,000, sodium 142, potassium 4.2, bicarb 29, BUN 10, creatinine 0.89.  SARS-CoV-2 test was obtained and pending.  Portable chest x-ray showed worsening appearance of both lungs with increasing pleural-parenchymal opacities in the  left hemithorax felt due to combination of metastatic disease, loculated pleural fluid, and possible posterior obstructive pneumonia.  Right IJ Port-A-Cath appears unchanged with tip extending to the right atrial level.  EDP discussed the case with oncology who will see in a.m.  The hospitalist service was consulted to admit for further evaluation and management.   Assessment & Plan:   Principal Problem:   Acute on chronic respiratory failure with hypoxia (HCC) Active Problems:   Metastatic breast cancer (HCC)   Pleural effusion on left   Malignant neoplasm of left female breast (HCC)   Thrombocytosis (HCC)   Antineoplastic chemotherapy induced anemia  Acute on chronic respiratory failure with hypoxia due to pulmonary metastases and malignant pleural effusion: -Worsening opacities of the left hemithorax seen on chest x-ray.  Concern for loculated pleural fluid per radiology read.  Recently had Pleurx catheter removed from left side as patient was unable to drain catheter.  Initially required nonrebreather, currently on [place] -Continue supplemental oxygen -IR thoracentesis requested. Pleurx placed 7/29 -Oncology following. 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 Maylon Peppers.  Currently receiving palliative therapy with Kadcyla. -Oncology following. Defer additional testing to Oncology service. Possible MRI brain when pt is able to  Chronic cancer related pain: -Continue home MS Contin 30 mg every 12 hours and as needed OxyIR 10 mg every 6 hours with hold parameters. -Currently seems to be stable  Anxiety: -Continue Ativan 0.5 mg every 6 hours as needed with hold parameters. -Seems stable this AM  Sinus tach -Tachycardic to the 120's overnight -Likely secondary to above effusions -Have started low dose beta blocker  DVT  prophylaxis: SCD's Code Status: Full Family Communication: Pt in room,family not at bedside Disposition Plan: Uncertain at this time  Consultants:   Oncology  IR  Procedures:   Pleurx cath placement 7/29  Antimicrobials: Anti-infectives (From admission, onward)   Start     Dose/Rate Route Frequency Ordered Stop   03/07/19 1400  ceFAZolin (ANCEF) 2-4 GM/100ML-% IVPB    Note to Pharmacy: Hilma Favors   : cabinet override      03/07/19 1400 03/07/19 1410      Subjective: Reports feeling better, however still sob  Objective: Vitals:   03/08/19 1107 03/08/19 1200 03/08/19 1300 03/08/19 1400  BP: 122/82 (!) 97/41 120/70 107/73  Pulse: (!) 114 (!) 106 (!) 109 (!) 114  Resp: (!) 22 19 19 16   Temp:  99 F (37.2 C)    TempSrc:  Oral    SpO2: 94% 98% 95% 97%  Weight:      Height:        Intake/Output Summary (Last 24 hours) at 03/08/2019 1453 Last data filed at 03/07/2019 2200 Gross per 24 hour  Intake 100 ml  Output 675 ml  Net -575 ml   Filed Weights   03/06/19 2032 03/07/19 0500  Weight: 99.3 kg 100.4 kg    Examination: General exam: Awake, laying in bed, in nad Respiratory system: increased resp effort, decreased BS Cardiovascular system: regular rate, s1, s2 Gastrointestinal system: Soft, nondistended, positive BS, obese Central nervous system: CN2-12 grossly intact, strength intact Extremities: Perfused, no clubbing Skin: Normal skin turgor, no notable skin lesions seen Psychiatry: Mood normal // no visual hallucinations   Data Reviewed: I have personally reviewed following labs and imaging studies  CBC: Recent Labs  Lab 03/06/19 1617 03/07/19 0534 03/08/19 1055  WBC 10.9* 10.1 8.8  NEUTROABS 9.2*  --   --   HGB 10.4* 10.3* 9.9*  HCT 36.8 36.7 34.7*  MCV 91.1 90.8 91.1  PLT 500* 474* 734*   Basic Metabolic Panel: Recent Labs  Lab 03/06/19 1617 03/07/19 0534 03/08/19 1055  NA 142 140 138  K 4.2 3.9 3.5  CL 102 98 98  CO2 29 32 30    GLUCOSE 95 96 110*  BUN 10 11 12   CREATININE 0.89 0.82 0.86  CALCIUM 9.2 9.0 8.8*   GFR: Estimated Creatinine Clearance: 96.2 mL/min (by C-G formula based on SCr of 0.86 mg/dL). Liver Function Tests: Recent Labs  Lab 03/06/19 1617 03/07/19 0534  AST 24 25  ALT 15 13  ALKPHOS 73 75  BILITOT 0.4 0.6  PROT 7.8 7.6  ALBUMIN 3.0* 2.8*   No results for input(s): LIPASE, AMYLASE in the last 168 hours. No results for input(s): AMMONIA in the last 168 hours. Coagulation Profile: No results for input(s): INR, PROTIME in the last 168 hours. Cardiac Enzymes: No results for input(s): CKTOTAL, CKMB, CKMBINDEX, TROPONINI in the last 168 hours. BNP (last 3 results) No results for input(s): PROBNP in the last 8760 hours. HbA1C: No results for input(s): HGBA1C in the last 72 hours. CBG: No results for input(s): GLUCAP in the last 168 hours. Lipid Profile: No results for input(s): CHOL, HDL, LDLCALC, TRIG, CHOLHDL, LDLDIRECT in the last 72 hours. Thyroid Function Tests: No results for input(s): TSH, T4TOTAL, FREET4, T3FREE, THYROIDAB in the last 72 hours. Anemia Panel: No results for input(s): VITAMINB12, FOLATE, FERRITIN, TIBC, IRON, RETICCTPCT in the last  72 hours. Sepsis Labs: No results for input(s): PROCALCITON, LATICACIDVEN in the last 168 hours.  Recent Results (from the past 240 hour(s))  SARS Coronavirus 2 (CEPHEID - Performed in [place] hospital lab), Hosp Order     Status: None   Collection Time: 03/06/19  6:15 PM   Specimen: Nasopharyngeal Swab  Result Value Ref Range Status   SARS Coronavirus 2 NEGATIVE NEGATIVE Final    Comment: (NOTE) If result is NEGATIVE SARS-CoV-2 target nucleic acids are NOT DETECTED. The SARS-CoV-2 RNA is generally detectable in upper and lower  respiratory specimens during the acute phase of infection. The lowest  concentration of SARS-CoV-2 viral copies this assay can detect is 250  copies / mL. A negative result does not preclude  SARS-CoV-2 infection  and should not be used as the sole basis for treatment or other  patient management decisions.  A negative result may occur with  improper specimen collection / handling, submission of specimen other  than nasopharyngeal swab, presence of viral mutation(s) within the  areas targeted by this assay, and inadequate number of viral copies  (<250 copies / mL). A negative result must be combined with clinical  observations, patient history, and epidemiological information. If result is POSITIVE SARS-CoV-2 target nucleic acids are DETECTED. The SARS-CoV-2 RNA is generally detectable in upper and lower  respiratory specimens dur ing the acute phase of infection.  Positive  results are indicative of active infection with SARS-CoV-2.  Clinical  correlation with patient history and other diagnostic information is  necessary to determine patient infection status.  Positive results do  not rule out bacterial infection or co-infection with other viruses. If result is PRESUMPTIVE POSTIVE SARS-CoV-2 nucleic acids MAY BE PRESENT.   A presumptive positive result was obtained on the submitted specimen  and confirmed on repeat testing.  While 2019 novel coronavirus  (SARS-CoV-2) nucleic acids may be present in the submitted sample  additional confirmatory testing may be necessary for epidemiological  and / or clinical management purposes  to differentiate between  SARS-CoV-2 and other Sarbecovirus currently known to infect humans.  If clinically indicated additional testing with an alternate test  methodology [phone]) is advised. The SARS-CoV-2 RNA is generally  detectable in upper and lower respiratory sp ecimens during the acute  phase of infection. The expected result is Negative. Fact Sheet for Patients:  StrictlyIdeas.no Fact Sheet for Healthcare Providers: BankingDealers.co.za This test is not yet approved or cleared by the  [place] FDA and has been authorized for detection and/or diagnosis of SARS-CoV-2 by FDA under an Emergency Use Authorization (EUA).  This EUA will remain in effect (meaning this test can be used) for the duration of the COVID-19 declaration under Section 564(b)(1) of the Act, 21 U.S.C. section 360bbb-3(b)(1), unless the authorization is terminated or revoked sooner. Performed at [facility], [place] [address], [place], [place] [address]   MRSA PCR Screening     Status: Abnormal   Collection Time: 03/06/19  8:39 PM   Specimen: Nasal Mucosa; Nasopharyngeal  Result Value Ref Range Status   MRSA by PCR POSITIVE (A) NEGATIVE Final    Comment:        The GeneXpert MRSA Assay (FDA approved for NASAL specimens only), is one component of a comprehensive MRSA colonization surveillance program. It is not intended to diagnose MRSA infection nor to guide or monitor treatment for MRSA infections. RESULT CALLED TO, READ BACK BY AND VERIFIED WITH: HOPPER,M @ [address] ON [id number] BY POTEAT,S Performed  at [facility], [place] [address] [place], [place] [address]      Radiology Studies: Dg Chest Port 1 View  Result Date: 03/06/2019 CLINICAL DATA:  Abdominal distension with fluid retention and shortness of breath. Stage IV breast cancer. EXAM: PORTABLE CHEST 1 VIEW COMPARISON:  Radiographs 02/19/2019 and 01/04/2019.  CT 12/07/2018. FINDINGS: 1522 hours. Two views obtained. There are progressively lower lung volumes. The right IJ Port-A-Cath appears grossly unchanged, tip extending to the right atrial level. There is progressive opacification of left hemithorax due to a combination of left lower lobe consolidation, a loculated pleural effusion and metastatic disease. Multiple nodules in the right lung are again noted. No pneumothorax. Telemetry leads overlie the chest. IMPRESSION: Overall worsening of the lungs with increasing pleuroparenchymal opacities in the left  hemithorax, attributed to a combination of metastatic disease, loculated pleural fluid and possible posterior obstructive pneumonia. No significant changes seen in the right lung. Electronically Signed   By: Richardean Sale M.D.   On: 03/06/2019 16:44   Ir Perc Pleural Drain W/indwell Cath W/img Guide  Result Date: 03/07/2019 INDICATION: 43 year old female with left inflammatory breast cancer and bilateral malignant pleural effusions. Currently, her right pleural effusion is causing shortness of breath. She most recently underwent thoracentesis on 02/19/2019 with removal of 670 mL. She presents today for placement of a tunneled pleural drainage catheter. EXAM: IR PERC PLEURAL DRAIN W/INDWELL CATH W/IMG GUIDE MEDICATIONS: 2 g Ancef ANESTHESIA/SEDATION: Fentanyl 100 mcg IV; Versed 1 mg IV Moderate Sedation Time:  16 minutes The patient was continuously monitored during the procedure by the interventional radiology nurse under my direct supervision. COMPLICATIONS: None immediate. PROCEDURE: Informed written consent was obtained from the patient after a thorough discussion of the procedural risks, benefits and alternatives. All questions were addressed. Maximal Sterile Barrier Technique was utilized including caps, mask, sterile gowns, sterile gloves, sterile drape, hand hygiene and skin antiseptic. A timeout was performed prior to the initiation of the procedure. The right chest was interrogated with ultrasound. A large pleural effusion is identified. Suitable skin entry site was selected and marked. Local anesthesia was attained by infiltration with 1% lidocaine. Under sonographic guidance, an 18 gauge sheath needle was advanced into the fluid collection. The needle portion was removed. An Amplatz wire was advanced into the pleural space. A catheter exit site 5 cm anterior and slightly inferior to the pleural entry site was selected. Local anesthesia was again attained by infiltration with 1% lidocaine. A small  dermatotomy was made. The PleurX catheter was tunneled from the skin exit site to the dermatotomy overlying pleural access site. A peel-away sheath was then advanced over the wire and into the pleural space. The catheter was advanced through the peel-away sheath and the peel-away sheath was discarded. The catheter was connected to a vacuum tender bottle and 1250 mL was evacuated. An image was obtained and stored for the medical record. The dermatotomy overlying the pleural access site was closed with an inverted interrupted 4 0 Vicryl suture in the epidermis sealed with Dermabond. The catheter was secured to the skin at the exit site with an 0 Prolene suture. Sterile bandages were applied. IMPRESSION: Successful placement of a right-sided tunneled pleural drainage catheter. Initial aspiration yields 1250 mL pleural fluid. Electronically Signed   By: Jacqulynn Cadet M.D.   On: 03/07/2019 18:34    Scheduled Meds:  Chlorhexidine Gluconate Cloth  6 each Topical Daily   Chlorhexidine Gluconate Cloth  6 each Topical Q0600   gabapentin  600 mg Oral BID   guaiFENesin  600 mg Oral BID   mouth rinse  15 mL Mouth Rinse BID   metoprolol tartrate  12.5 mg Oral BID   morphine  30 mg Oral Q12H   mupirocin ointment  1 application Nasal BID   sodium chloride flush  3 mL Intravenous Q12H   Continuous Infusions:   LOS: 2 days   Marylu Lund, MD [facility] Pager On Amion  If 7PM-7AM, please contact night-coverage 03/08/2019, 2:53 PM

## 2019-03-08 NOTE — Progress Notes (Signed)
HEMATOLOGY-ONCOLOGY PROGRESS NOTE  SUBJECTIVE: Reports some mild improvement in her shortness of breath after her right chest Pleurx catheter placement.  She still remains dyspneic.  She is on O2 at 4 L.  Still having a productive cough.  She reports pain to the right Pleurx catheter insertion site.  Denies abdominal pain, nausea, vomiting.  Oncology History  Metastatic breast cancer ([place])  09/30/2018 Imaging   CT chest with contrast (at [facility]): Impressions: 1.  Dermal thickening of the multiple masses within the left breast as well as a invasive 6 cm mass of the left pectoralis major, compatible with primary breast cancer. 2.  Numerous pulmonary metastases.  Left axillary, retropectoral, supraclavicular, and upper mediastinal metastatic lymphadenopathy.  Mildly enlarged right axillary and supraclavicular lymph nodes may be metastatic. 3.  Indeterminant subcentimeter lucency in the dome of the right liver. 4.  Small left pleural effusion.   10/10/2018 Initial Diagnosis   Breast cancer ([place])   10/17/2018 Imaging   CTA chest: IMPRESSION: 1. No evidence of lobar or more central pulmonary embolus. Nondiagnostic segmental assessment. 2. Moderate left pleural effusion, increased in size from the prior CT with increasing left lung atelectasis. 3. Unchanged appearance of multiple left breast masses including an invasive mass involving the pectoralis major. 4. Unchanged left axillary, left subpectoral, and mediastinal lymphadenopathy. 5. Mild interval enlargement of multiple lung metastases and of right axillary lymph nodes.   10/18/2018 Imaging   MRI brain w/o and w/ contrast: Single 4 mm focus of enhancement within right frontal white matter probably representing metastatic disease. Attention at follow-up is recommended.   10/19/2018 Imaging   CT abdomen/pelvis w/ contrast: IMPRESSION: 1. No CT evidence of metastatic disease involving the abdomen or pelvis. 2. Left pleural  effusion associated atelectasis consolidation with numerous pulmonary masses and nodules on partially imaged. Skin thickening of the left breast. Findings are in keeping with advanced breast malignancy and as seen on recent CT of the chest, 10/17/2018.   10/19/2018 Procedure   US-guided bx of left supraclavicular LN    10/19/2018 Pathology Results   Accession: [id number]  Lymph node, needle/core biopsy, left supraclavicular - METASTATIC CARCINOMA   11/09/2018 - 02/14/2019 Chemotherapy   The patient had pegfilgrastim-cbqv (UDENYCA) injection 6 mg, 6 mg, Subcutaneous, Once, 1 of 1 cycle Administration: 6 mg (11/10/2018) trastuzumab (HERCEPTIN) 900 mg in sodium chloride 0.9 % 250 mL chemo infusion, 966 mg, Intravenous,  Once, 4 of 7 cycles Administration: 900 mg (11/09/2018), 600 mg (12/14/2018), 600 mg (01/04/2019), 600 mg (01/25/2019) DOCEtaxel (TAXOTERE) 170 mg in sodium chloride 0.9 % 250 mL chemo infusion, 75 mg/m2 = 170 mg, Intravenous,  Once, 4 of 7 cycles Dose modification: 60 mg/m2 (original dose 75 mg/m2, Cycle 2, Reason: Dose not tolerated) Administration: 170 mg (11/09/2018), 140 mg (12/14/2018), 140 mg (01/04/2019), 140 mg (01/25/2019) pertuzumab (PERJETA) 840 mg in sodium chloride 0.9 % 250 mL chemo infusion, 840 mg, Intravenous, Once, 4 of 7 cycles Administration: 840 mg (11/09/2018), 420 mg (12/14/2018), 420 mg (01/04/2019), 420 mg (01/25/2019)  for chemotherapy treatment.    02/06/2019 Imaging   CT CAP: IMPRESSION: 1. There is mixed response to treatment, with decreased size of primary left breast mass and pulmonary nodules, however with enlarging lymphadenopathy and new, extensive sclerotic osseous metastatic disease.   2. Primary mass of the posterior left breast, involving the pectoralis major, has significantly decreased in size compared to prior examination, now measuring approximately 3.0 cm, previously at least 6.0 cm (series 2, image 14). There is  unchanged, severe skin thickening of  the left breast.   3. Numerous bilateral pulmonary masses and nodules have generally decreased in size, an index nodule of the right lower lobe measuring 2.0 cm, previously 3.1 cm on measured similarly (series 3, image 66). There has been interval increase in volume of bilateral pleural effusions, with extensive consolidation and volume loss of the left lung, with extensive nodular pleural disease. 4. However, bulky axillary lymph nodes have increased in size compared to prior examination, the largest in the right axilla 4.3 x 2.7 cm, previously 3.3 x 1.5 cm when measured similarly (series 2, image 11). Interval enlargement of supraclavicular and mediastinal lymph nodes, the largest supraclavicular nodes on the left measuring 3.0 x 1.5 cm, previously 2.4 x 1.1 cm (series 2, image 5). Newly enlarged epicardial lymph nodes measuring up to 1.4 x 1.0 cm (series 2, image 37). There are newly enlarged left retroperitoneal lymph nodes measuring up to 1.4 x 1.0 cm (series 2, image 58).   5. There are innumerable new sclerotic osseous metastatic lesions involving the included axial and appendicular skeleton. In retrospect, some of these lesions were likely subtly present on prior CT dated 10/19/2018 and to some extent this change reflects post treatment appearance of metastatic lesions.   02/15/2019 -  Chemotherapy   The patient had ado-trastuzumab emtansine (KADCYLA) 320 mg in sodium chloride 0.9 % 250 mL chemo infusion, 360 mg, Intravenous, Once, 1 of 5 cycles Administration: 320 mg (02/15/2019)  for chemotherapy treatment.       REVIEW OF SYSTEMS:   Constitutional: Denies fevers, chills Respiratory: Reports shortness of breath and cough. Cardiovascular: Reports intermittent chest discomfort Gastrointestinal:  Denies nausea, heartburn or change in bowel habits Skin: Breast wound that is followed by the wound clinic. Lymphatics: Denies new lymphadenopathy or easy  bruising Neurological:Denies numbness, tingling or new weaknesses Behavioral/Psych: Mood is stable, no new changes  Extremities: No lower extremity edema All other systems were reviewed with the patient and are negative.  I have reviewed the past medical history, past surgical history, social history and family history with the patient and they are unchanged from previous note.   PHYSICAL EXAMINATION: ECOG PERFORMANCE STATUS: 1 - Symptomatic but completely ambulatory  Vitals:   03/08/19 0600 03/08/19 0759  BP: 118/84   Pulse: (!) 111   Resp: 19   Temp:  99.2 F (37.3 C)  SpO2: 96%    Filed Weights   03/06/19 2032 03/07/19 0500  Weight: 218 lb 14.7 oz (99.3 kg) 221 lb 5.5 oz (100.4 kg)    Intake/Output from previous day: 07/29 0701 - 07/30 0700 In: 100 [P.O.:100] Out: 675 [Urine:675]  GENERAL:alert, appears short of breath EYES: normal, Conjunctiva are pink and non-injected, sclera clear OROPHARYNX: No thrush or mucositis LUNGS: Diminished breath sounds in the lung bases but improved aeration in the right base.  Inspiratory rales in the upper lung fields. HEART: Tachycardic, regular rhythm, no lower extremity edema ABDOMEN:abdomen soft, non-tender and normal bowel sounds Musculoskeletal:no cyanosis of digits and no clubbing  NEURO: alert & oriented x 3 with fluent speech, no focal motor/sensory deficits  LABORATORY DATA:  I have reviewed the data as listed CMP Latest Ref Rng & Units 03/07/2019 03/06/2019 02/15/2019  Glucose 70 - 99 mg/dL 96 95 116(H)  BUN 6 - 20 mg/dL 11 10 11   Creatinine 0.44 - 1.00 mg/dL 0.82 0.89 0.65  Sodium 135 - 145 mmol/L 140 142 140  Potassium 3.5 - 5.1 mmol/L 3.9 4.2 4.6  Chloride 98 - 111 mmol/L 98 102 101  CO2 22 - 32 mmol/L 32 29 31  Calcium 8.9 - 10.3 mg/dL 9.0 9.2 9.5  Total Protein 6.5 - 8.1 g/dL 7.6 7.8 7.2  Total Bilirubin 0.3 - 1.2 mg/dL 0.6 0.4 0.4  Alkaline Phos 38 - 126 U/L 75 73 74  AST 15 - 41 U/L 25 24 13(L)  ALT 0 - 44 U/L 13  15 10     Lab Results  Component Value Date   WBC 10.1 03/07/2019   HGB 10.3 (L) 03/07/2019   HCT 36.7 03/07/2019   MCV 90.8 03/07/2019   PLT 474 (H) 03/07/2019   NEUTROABS 9.2 (H) 03/06/2019    Dg Chest 1 View  Result Date: 02/19/2019 CLINICAL DATA:  Status post thoracentesis. History of breast carcinoma EXAM: CHEST  1 VIEW COMPARISON:  Jan 04, 2019 chest radiograph and chest CT angiogram December 07, 2018 FINDINGS: No pneumothorax. Moderate lower lobe consolidation and effusion present on the left. There are multiple mass lesions in the lungs consistent with metastatic foci. Heart size and pulmonary vascular normal. No adenopathy appreciable by radiography. Port-A-Cath tip is at the caval cervical junction. No bone lesions. IMPRESSION: No pneumothorax. Left lower lobe consolidation with left pleural effusion present. Multiple pulmonary metastases noted. The previously noted adenopathy seen on CT examination is not appreciable by radiography. Electronically Signed   By: Lowella Grip III M.D.   On: 02/19/2019 10:20   Ct Chest W Contrast  Result Date: 02/06/2019 CLINICAL DATA:  Follow-up metastatic breast cancer EXAM: CT CHEST, ABDOMEN, AND PELVIS WITH CONTRAST TECHNIQUE: Multidetector CT imaging of the chest, abdomen and pelvis was performed following the standard protocol during bolus administration of intravenous contrast. CONTRAST:  138m OMNIPAQUE IOHEXOL 300 MG/ML SOLN, additional oral enteric contrast COMPARISON:  CT pulmonary angiogram, 12/07/2018, CT chest abdomen pelvis, 10/19/2018, CT pulmonary angiogram, 10/17/2018, CT chest, 09/30/2018 FINDINGS: CT CHEST FINDINGS Cardiovascular: Right internal jugular port catheter. Normal heart size. No pericardial effusion. Mediastinum/Nodes: Bulky axillary lymph nodes have increased in size compared to prior examination, the largest in the right axilla 4.3 x 2.7 cm, previously 3.3 x 1.5 cm when measured similarly (series 2, image 11). Interval  enlargement of supraclavicular and mediastinal lymph nodes, the largest supraclavicular nodes on the left measuring 3.0 x 1.5 cm, previously 2.4 x 1.1 cm (series 2, image 5). Newly enlarged epicardial lymph nodes measuring up to 1.4 x 1.0 cm (series 2, image 37). Thyroid gland, trachea, and esophagus demonstrate no significant findings. Lungs/Pleura: Numerous bilateral pulmonary masses and nodules have generally decreased in size, an index nodule of the right lower lobe measuring 2.0 cm, previously 3.1 cm on measured similarly (series 3, image 66). There has been interval increase in volume of bilateral pleural effusions, with extensive consolidation and volume loss of the left lung, with extensive nodular pleural disease. 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 A previously seen left-sided chest tube has been removed. Musculoskeletal: Primary mass of the posterior left breast, involving the pectoralis major, has significantly decreased in size compared to prior examination, now measuring approximately 3.0 cm, previously at least 6.0 cm (series 2, image 14). There is unchanged, severe skin thickening of the left breast. CT ABDOMEN PELVIS FINDINGS Hepatobiliary: No solid liver abnormality is seen. No gallstones, gallbladder wall thickening, or biliary dilatation. Pancreas: Unremarkable. No pancreatic ductal dilatation or surrounding inflammatory changes. Spleen: Normal in size without significant abnormality. Adrenals/Urinary Tract: Adrenal glands are unremarkable.  Kidneys are normal, without renal calculi, solid lesion, or hydronephrosis. Bladder is unremarkable. Stomach/Bowel: Stomach is within normal limits. Appendix appears normal. No evidence of bowel wall thickening, distention, or inflammatory changes. Occasional sigmoid diverticula. Vascular/Lymphatic: No significant vascular findings are present. There are newly enlarged left retroperitoneal lymph nodes  measuring up to 1.4 x 1.0 cm (series 2, image 58). Reproductive: No mass or other abnormality. Other: No abdominal wall hernia or abnormality. No abdominopelvic ascites. Musculoskeletal: Innumerable new sclerotic osseous metastatic lesions involving the included axial and appendicular skeleton. In retrospect, some of these lesions were likely subtly present on prior CT dated 10/19/2018 (e.g. In the left ilium prior examination series 2, image 62). IMPRESSION: 1. There is mixed response to treatment, with decreased size of primary left breast mass and pulmonary nodules, however with enlarging lymphadenopathy and new, extensive sclerotic osseous metastatic disease. 2. Primary mass of the posterior left breast, involving the pectoralis major, has significantly decreased in size compared to prior examination, now measuring approximately 3.0 cm, previously at least 6.0 cm (series 2, image 14). There is unchanged, severe skin thickening of the left breast. 3. Numerous bilateral pulmonary masses and nodules have generally decreased in size, an index nodule of the right lower lobe measuring 2.0 cm, previously 3.1 cm on measured similarly (series 3, image 66). There has been interval increase in volume of bilateral pleural effusions, with extensive consolidation and volume loss of the left lung, with extensive nodular pleural disease. 4. However, bulky axillary lymph nodes have increased in size compared to prior examination, the largest in the right axilla 4.3 x 2.7 cm, previously 3.3 x 1.5 cm when measured similarly (series 2, image 11). Interval enlargement of supraclavicular and mediastinal lymph nodes, the largest supraclavicular nodes on the left measuring 3.0 x 1.5 cm, previously 2.4 x 1.1 cm (series 2, image 5). Newly enlarged epicardial lymph nodes measuring up to 1.4 x 1.0 cm (series 2, image 37). There are newly enlarged left retroperitoneal lymph nodes measuring up to 1.4 x 1.0 cm (series 2, image 58). 5. There  are innumerable new sclerotic osseous metastatic lesions involving the included axial and appendicular skeleton. In retrospect, some of these lesions were likely subtly present on prior CT dated 10/19/2018 and to some extent this change reflects post treatment appearance of metastatic lesions. Electronically Signed   By: Eddie Candle M.D.   On: 02/06/2019 16:04   Ct Abdomen Pelvis W Contrast  Result Date: 02/06/2019 CLINICAL DATA:  Follow-up metastatic breast cancer EXAM: CT CHEST, ABDOMEN, AND PELVIS WITH CONTRAST TECHNIQUE: Multidetector CT imaging of the chest, abdomen and pelvis was performed following the standard protocol during bolus administration of intravenous contrast. CONTRAST:  182m OMNIPAQUE IOHEXOL 300 MG/ML SOLN, additional oral enteric contrast COMPARISON:  CT pulmonary angiogram, 12/07/2018, CT chest abdomen pelvis, 10/19/2018, CT pulmonary angiogram, 10/17/2018, CT chest, 09/30/2018 FINDINGS: CT CHEST FINDINGS Cardiovascular: Right internal jugular port catheter. Normal heart size. No pericardial effusion. Mediastinum/Nodes: Bulky axillary lymph nodes have increased in size compared to prior examination, the largest in the right axilla 4.3 x 2.7 cm, previously 3.3 x 1.5 cm when measured similarly (series 2, image 11). Interval enlargement of supraclavicular and mediastinal lymph nodes, the largest supraclavicular nodes on the left measuring 3.0 x 1.5 cm, previously 2.4 x 1.1 cm (series 2, image 5). Newly enlarged epicardial lymph nodes measuring up to 1.4 x 1.0 cm (series 2, image 37). Thyroid gland, trachea, and esophagus demonstrate no significant findings. Lungs/Pleura: Numerous bilateral pulmonary masses and  nodules have generally decreased in size, an index nodule of the right lower lobe measuring 2.0 cm, previously 3.1 cm on measured similarly (series 3, image 66). There has been interval increase in volume of bilateral pleural effusions, with extensive consolidation and volume loss of  the left lung, with extensive nodular pleural disease. 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 A previously seen left-sided chest tube has been removed. Musculoskeletal: Primary mass of the posterior left breast, involving the pectoralis major, has significantly decreased in size compared to prior examination, now measuring approximately 3.0 cm, previously at least 6.0 cm (series 2, image 14). There is unchanged, severe skin thickening of the left breast. CT ABDOMEN PELVIS FINDINGS Hepatobiliary: No solid liver abnormality is seen. No gallstones, gallbladder wall thickening, or biliary dilatation. Pancreas: Unremarkable. No pancreatic ductal dilatation or surrounding inflammatory changes. Spleen: Normal in size without significant abnormality. Adrenals/Urinary Tract: Adrenal glands are unremarkable. Kidneys are normal, without renal calculi, solid lesion, or hydronephrosis. Bladder is unremarkable. Stomach/Bowel: Stomach is within normal limits. Appendix appears normal. No evidence of bowel wall thickening, distention, or inflammatory changes. Occasional sigmoid diverticula. Vascular/Lymphatic: No significant vascular findings are present. There are newly enlarged left retroperitoneal lymph nodes measuring up to 1.4 x 1.0 cm (series 2, image 58). Reproductive: No mass or other abnormality. Other: No abdominal wall hernia or abnormality. No abdominopelvic ascites. Musculoskeletal: Innumerable new sclerotic osseous metastatic lesions involving the included axial and appendicular skeleton. In retrospect, some of these lesions were likely subtly present on prior CT dated 10/19/2018 (e.g. In the left ilium prior examination series 2, image 62). IMPRESSION: 1. There is mixed response to treatment, with decreased size of primary left breast mass and pulmonary nodules, however with enlarging lymphadenopathy and new, extensive sclerotic osseous metastatic disease. 2.  Primary mass of the posterior left breast, involving the pectoralis major, has significantly decreased in size compared to prior examination, now measuring approximately 3.0 cm, previously at least 6.0 cm (series 2, image 14). There is unchanged, severe skin thickening of the left breast. 3. Numerous bilateral pulmonary masses and nodules have generally decreased in size, an index nodule of the right lower lobe measuring 2.0 cm, previously 3.1 cm on measured similarly (series 3, image 66). There has been interval increase in volume of bilateral pleural effusions, with extensive consolidation and volume loss of the left lung, with extensive nodular pleural disease. 4. However, bulky axillary lymph nodes have increased in size compared to prior examination, the largest in the right axilla 4.3 x 2.7 cm, previously 3.3 x 1.5 cm when measured similarly (series 2, image 11). Interval enlargement of supraclavicular and mediastinal lymph nodes, the largest supraclavicular nodes on the left measuring 3.0 x 1.5 cm, previously 2.4 x 1.1 cm (series 2, image 5). Newly enlarged epicardial lymph nodes measuring up to 1.4 x 1.0 cm (series 2, image 37). There are newly enlarged left retroperitoneal lymph nodes measuring up to 1.4 x 1.0 cm (series 2, image 58). 5. There are innumerable new sclerotic osseous metastatic lesions involving the included axial and appendicular skeleton. In retrospect, some of these lesions were likely subtly present on prior CT dated 10/19/2018 and to some extent this change reflects post treatment appearance of metastatic lesions. Electronically Signed   By: Eddie Candle M.D.   On: 02/06/2019 16:04   Dg Chest Port 1 View  Result Date: 03/06/2019 CLINICAL DATA:  Abdominal distension with fluid retention and shortness of breath. Stage IV breast  cancer. EXAM: PORTABLE CHEST 1 VIEW COMPARISON:  Radiographs 02/19/2019 and 01/04/2019.  CT 12/07/2018. FINDINGS: 1522 hours. Two views obtained. There are  progressively lower lung volumes. The right IJ Port-A-Cath appears grossly unchanged, tip extending to the right atrial level. There is progressive opacification of left hemithorax due to a combination of left lower lobe consolidation, a loculated pleural effusion and metastatic disease. Multiple nodules in the right lung are again noted. No pneumothorax. Telemetry leads overlie the chest. IMPRESSION: Overall worsening of the lungs with increasing pleuroparenchymal opacities in the left hemithorax, attributed to a combination of metastatic disease, loculated pleural fluid and possible posterior obstructive pneumonia. No significant changes seen in the right lung. Electronically Signed   By: Richardean Sale M.D.   On: 03/06/2019 16:44   Ir Removal Of Plural Cath W/cuff  Result Date: 02/16/2019 INDICATION: Recurrent left pleural effusion s/p Pleurx catheter placement 11/01/2018. Patient has been unable to drain her catheter for the past several weeks. Request is made for removal. EXAM: REMOVAL OF TUNNELED PLEURAL CATHETER MEDICATIONS: None ANESTHESIA/SEDATION: None FLUOROSCOPY TIME:  None COMPLICATIONS: None immediate. PROCEDURE: A sterile gown and gloves were worn during the procedure. Only a short length of catheter remained within the skin so that with removal of the overlying dressing and foam the catheter was then successfully removed in its entirety. A sterile dressing was applied over the catheter exit site. IMPRESSION: Removal of tunneled dialysis catheter utilizing sharp and blunt dissection. Read by: Brynda Greathouse PA-C Electronically Signed   By: Corrie Mckusick D.O.   On: 02/16/2019 13:52   Ir Thoracentesis Asp Pleural Space W/img Guide  Result Date: 02/19/2019 INDICATION: Patient with history of metastatic breast cancer, dyspnea, bilateral pleural effusions, larger on right. Request received for therapeutic right thoracentesis. EXAM: ULTRASOUND GUIDED THERAPEUTIC RIGHT THORACENTESIS MEDICATIONS:  None COMPLICATIONS: None immediate. PROCEDURE: An ultrasound guided thoracentesis was thoroughly discussed with the patient and questions answered. The benefits, risks, alternatives and complications were also discussed. The patient understands and wishes to proceed with the procedure. Written consent was obtained. Ultrasound was performed to localize and mark an adequate pocket of fluid in the right chest. The area was then prepped and draped in the normal sterile fashion. 1% Lidocaine was used for local anesthesia. Under ultrasound guidance a 6 Fr Safe-T-Centesis catheter was introduced. Thoracentesis was performed. The catheter was removed and a dressing applied. FINDINGS: A total of approximately 670 cc of yellow fluid was removed. IMPRESSION: Successful ultrasound guided therapeutic right thoracentesis yielding 670 cc of pleural fluid. Read by: Rowe Robert, PA-C No pneumothorax on follow-up chest radiograph. Electronically Signed   By: Lucrezia Europe M.D.   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 Currently, her right pleural effusion is causing shortness of breath. She most recently underwent thoracentesis on 02/19/2019 with removal of 670 mL. She presents today for placement of a tunneled pleural drainage catheter. EXAM: IR PERC PLEURAL DRAIN W/INDWELL CATH W/IMG GUIDE MEDICATIONS: 2 g Ancef ANESTHESIA/SEDATION: Fentanyl 100 mcg IV; Versed 1 mg IV Moderate Sedation Time:  16 minutes The patient was continuously monitored during the procedure by the interventional radiology nurse under my direct supervision. COMPLICATIONS: None immediate. PROCEDURE: Informed written consent was obtained from the patient after a thorough discussion of the procedural risks, benefits and alternatives. All questions were addressed. Maximal Sterile Barrier Technique  was utilized including caps, mask,  sterile gowns, sterile gloves, sterile drape, hand hygiene and skin antiseptic. A timeout was performed prior to the initiation of the procedure. The right chest was interrogated with ultrasound. A large pleural effusion is identified. Suitable skin entry site was selected and marked. Local anesthesia was attained by infiltration with 1% lidocaine. Under sonographic guidance, an 18 gauge sheath needle was advanced into the fluid collection. The needle portion was removed. An Amplatz wire was advanced into the pleural space. A catheter exit site 5 cm anterior and slightly inferior to the pleural entry site was selected. Local anesthesia was again attained by infiltration with 1% lidocaine. A small dermatotomy was made. The PleurX catheter was tunneled from the skin exit site to the dermatotomy overlying pleural access site. A peel-away sheath was then advanced over the wire and into the pleural space. The catheter was advanced through the peel-away sheath and the peel-away sheath was discarded. The catheter was connected to a vacuum tender bottle and 1250 mL was evacuated. An image was obtained and stored for the medical record. The dermatotomy overlying the pleural access site was closed with an inverted interrupted 4 0 Vicryl suture in the epidermis sealed with Dermabond. The catheter was secured to the skin at the exit site with an 0 Prolene suture. Sterile bandages were applied. IMPRESSION: Successful placement of a right-sided tunneled pleural drainage catheter. Initial aspiration yields 1250 mL pleural fluid. Electronically Signed   By: Jacqulynn Cadet M.D.   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  -NCCN guidelines reviewed with the patient in detail; given the rapid disease progression, I discussed with the patient about changing treatment to Kadcyla, including its mechanism of action, efficacy and some of the common side effects -The most common adverse reactions (> 25%) with KADCYLA were fatigue, nausea, musculoskeletal pain, hemorrhage, thrombocytopenia, headache, increased transaminases, constipation and epistaxis -The patient is aware that the response rates discussed earlier is not guaranteed.   -After a long discussion, patient made an informed decision to proceed with the prescribed plan of care.  -As the patient has recently changed her insurance to [facility], we have obtained authorization to start [place] on 02/15/2019; she is status post 1 cycle of Kadcyla.  Will need to delay her second cycle secondary to hospitalization. -[id number] monitoring while on Kadcyla, next due in early 02/2019 -Antiemetics-she has PRN Zofran and Ativan available to her  Malignant left pleural effusion  -S/p Pleurx catheter placement in late 10/2018  -Review of the recent CT CAP showed enlarging bilateral pleural effusion, L > R, despite PleurX; the catheter was not visualized on CT, indicating that it likely slipped out of position -Previous Pleurx catheter was removed -She was scheduled for outpatient replacement, but the patient was late for her appointment and this could not be performed.  A thoracentesis was performed instead on 02/19/2019. -She has worsening dyspnea requiring hospitalization.  Status post right Pleurx placement. -She reported mild improvement in her dyspnea but is still quite short of breath. -She was noted to have a loculated pleural effusion on the  left side of her chest. -IR  has been reconsulted for evaluation and recommendations regarding management of her left pleural effusion.  May need a repeat CT of the chest but will defer this to IR. -If IR cannot assist, may need to consider consulting cardiothoracic surgery.  Metastasis to the brain  -S/p SBRT to the brain met on 11/08/2018 -Repeat MRI brain scheduled on 03/07/2019 -this will be delayed as SRS protocol is performed at [facility].  We will reschedule upon hospital discharge. -Patient denies any new focal neurologic deficits  -We will monitor it for now   Chemotherapy-associated anemia -Secondary to chemotherapy -Hemoglobin 10.3 on 03/07/2019; no CBC drawn today -Patient denies any symptom of bleeding -Patient has required periodic RBC transfusion; if anemia persists, we can consider adding Aranesp with chemotherapy  -We will monitor for now  Leukocytosis -Likely reactive in the setting of underlying malignancy -White blood cell count not drawn today -Patient denies any symptoms of infection, except a mild cough with mostly clear sputum production -We will monitor it for now  Thrombocytosis -Likely reactive in the setting of underlying malignancy -Platelets not drawn today -We will monitor it for now  Breast wound -Overall improving, followed by wound care center  -Continue wound care as instructed  Cancer-related pain -Secondary to the breast malignancy and catheter placement -On MS-Contin 61m BID w/ PRN oxycodone [id number] PRN for breakthrough pain -Oxycodone has been changed to every 4 hours as needed due to increased pain to her right chest following Pleurx catheter placement    LOS: 2 days   KrMikey BussingDNP, AGPCNP-BC, AOCNP 03/08/19

## 2019-03-09 ENCOUNTER — Inpatient Hospital Stay (HOSPITAL_COMMUNITY): Payer: BC Managed Care – PPO

## 2019-03-09 DIAGNOSIS — J189 Pneumonia, unspecified organism: Secondary | ICD-10-CM

## 2019-03-09 MED ORDER — IOHEXOL 300 MG/ML  SOLN
75.0000 mL | Freq: Once | INTRAMUSCULAR | Status: AC | PRN
  Administered 2019-03-09: 75 mL via INTRAVENOUS

## 2019-03-09 MED ORDER — AMOXICILLIN-POT CLAVULANATE 875-125 MG PO TABS
1.0000 | ORAL_TABLET | Freq: Two times a day (BID) | ORAL | Status: DC
  Administered 2019-03-09 – 2019-03-11 (×5): 1 via ORAL
  Filled 2019-03-09 (×5): qty 1

## 2019-03-09 MED ORDER — SODIUM CHLORIDE (PF) 0.9 % IJ SOLN
INTRAMUSCULAR | Status: AC
  Administered 2019-03-09: 14:00:00
  Filled 2019-03-09: qty 50

## 2019-03-09 NOTE — Progress Notes (Signed)
     [address]       [place] [id number]             [phone]       Called to evaluate images for possible right VATS, drainage of pleural effusion.  Have recommended that Dr. Wyline Copas obtain a CT chest for better evaluation, for there may potentially be a large fluid collection that can be drained.    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  Hopefully, there is a dominant fluid collection that can be drained via cross sectional imaging.  Sharon Floyd

## 2019-03-09 NOTE — Progress Notes (Signed)
HEMATOLOGY-ONCOLOGY PROGRESS NOTE  SUBJECTIVE: Still short of breath.  O2 at 4 L/min.  IR unable to perform left thoracentesis since fluid is loculated.  She has an ongoing productive cough.  Had a fever up to 100.4 overnight.  Still having some chest discomfort.  Oncology History  Metastatic breast cancer ([place])  09/30/2018 Imaging   CT chest with contrast (at [facility]): Impressions: 1.  Dermal thickening of the multiple masses within the left breast as well as a invasive 6 cm mass of the left pectoralis major, compatible with primary breast cancer. 2.  Numerous pulmonary metastases.  Left axillary, retropectoral, supraclavicular, and upper mediastinal metastatic lymphadenopathy.  Mildly enlarged right axillary and supraclavicular lymph nodes may be metastatic. 3.  Indeterminant subcentimeter lucency in the dome of the right liver. 4.  Small left pleural effusion.   10/10/2018 Initial Diagnosis   Breast cancer ([place])   10/17/2018 Imaging   CTA chest: IMPRESSION: 1. No evidence of lobar or more central pulmonary embolus. Nondiagnostic segmental assessment. 2. Moderate left pleural effusion, increased in size from the prior CT with increasing left lung atelectasis. 3. Unchanged appearance of multiple left breast masses including an invasive mass involving the pectoralis major. 4. Unchanged left axillary, left subpectoral, and mediastinal lymphadenopathy. 5. Mild interval enlargement of multiple lung metastases and of right axillary lymph nodes.   10/18/2018 Imaging   MRI brain w/o and w/ contrast: Single 4 mm focus of enhancement within right frontal white matter probably representing metastatic disease. Attention at follow-up is recommended.   10/19/2018 Imaging   CT abdomen/pelvis w/ contrast: IMPRESSION: 1. No CT evidence of metastatic disease involving the abdomen or pelvis. 2. Left pleural effusion associated atelectasis consolidation with numerous pulmonary masses and  nodules on partially imaged. Skin thickening of the left breast. Findings are in keeping with advanced breast malignancy and as seen on recent CT of the chest, 10/17/2018.   10/19/2018 Procedure   US-guided bx of left supraclavicular LN    10/19/2018 Pathology Results   Accession: [id number]  Lymph node, needle/core biopsy, left supraclavicular - METASTATIC CARCINOMA   11/09/2018 - 02/14/2019 Chemotherapy   The patient had pegfilgrastim-cbqv (UDENYCA) injection 6 mg, 6 mg, Subcutaneous, Once, 1 of 1 cycle Administration: 6 mg (11/10/2018) trastuzumab (HERCEPTIN) 900 mg in sodium chloride 0.9 % 250 mL chemo infusion, 966 mg, Intravenous,  Once, 4 of 7 cycles Administration: 900 mg (11/09/2018), 600 mg (12/14/2018), 600 mg (01/04/2019), 600 mg (01/25/2019) DOCEtaxel (TAXOTERE) 170 mg in sodium chloride 0.9 % 250 mL chemo infusion, 75 mg/m2 = 170 mg, Intravenous,  Once, 4 of 7 cycles Dose modification: 60 mg/m2 (original dose 75 mg/m2, Cycle 2, Reason: Dose not tolerated) Administration: 170 mg (11/09/2018), 140 mg (12/14/2018), 140 mg (01/04/2019), 140 mg (01/25/2019) pertuzumab (PERJETA) 840 mg in sodium chloride 0.9 % 250 mL chemo infusion, 840 mg, Intravenous, Once, 4 of 7 cycles Administration: 840 mg (11/09/2018), 420 mg (12/14/2018), 420 mg (01/04/2019), 420 mg (01/25/2019)  for chemotherapy treatment.    02/06/2019 Imaging   CT CAP: IMPRESSION: 1. There is mixed response to treatment, with decreased size of primary left breast mass and pulmonary nodules, however with enlarging lymphadenopathy and new, extensive sclerotic osseous metastatic disease.   2. Primary mass of the posterior left breast, involving the pectoralis major, has significantly decreased in size compared to prior examination, now measuring approximately 3.0 cm, previously at least 6.0 cm (series 2, image 14). There is unchanged, severe skin thickening of the left breast.   3.  Numerous bilateral pulmonary masses and nodules have  generally decreased in size, an index nodule of the right lower lobe measuring 2.0 cm, previously 3.1 cm on measured similarly (series 3, image 66). There has been interval increase in volume of bilateral pleural effusions, with extensive consolidation and volume loss of the left lung, with extensive nodular pleural disease. 4. However, bulky axillary lymph nodes have increased in size compared to prior examination, the largest in the right axilla 4.3 x 2.7 cm, previously 3.3 x 1.5 cm when measured similarly (series 2, image 11). Interval enlargement of supraclavicular and mediastinal lymph nodes, the largest supraclavicular nodes on the left measuring 3.0 x 1.5 cm, previously 2.4 x 1.1 cm (series 2, image 5). Newly enlarged epicardial lymph nodes measuring up to 1.4 x 1.0 cm (series 2, image 37). There are newly enlarged left retroperitoneal lymph nodes measuring up to 1.4 x 1.0 cm (series 2, image 58).   5. There are innumerable new sclerotic osseous metastatic lesions involving the included axial and appendicular skeleton. In retrospect, some of these lesions were likely subtly present on prior CT dated 10/19/2018 and to some extent this change reflects post treatment appearance of metastatic lesions.   02/15/2019 -  Chemotherapy   The patient had ado-trastuzumab emtansine (KADCYLA) 320 mg in sodium chloride 0.9 % 250 mL chemo infusion, 360 mg, Intravenous, Once, 1 of 5 cycles Administration: 320 mg (02/15/2019)  for chemotherapy treatment.       REVIEW OF SYSTEMS:   Constitutional: Denies fevers, chills Respiratory: Reports shortness of breath and productive cough. Cardiovascular: Reports intermittent chest discomfort Gastrointestinal:  Denies nausea, heartburn or change in bowel habits Skin: Breast wound that is followed by the wound clinic. Lymphatics: Denies new lymphadenopathy or easy bruising Neurological:Denies numbness, tingling or new weaknesses Behavioral/Psych: Mood is  stable, no new changes  Extremities: No lower extremity edema All other systems were reviewed with the patient and are negative.  I have reviewed the past medical history, past surgical history, social history and family history with the patient and they are unchanged from previous note.   PHYSICAL EXAMINATION: ECOG PERFORMANCE STATUS: 1 - Symptomatic but completely ambulatory  Vitals:   03/09/19 0900 03/09/19 1100  BP: 123/77 (!) 143/89  Pulse: (!) 116 (!) 113  Resp: 19 (!) 21  Temp:    SpO2: 96% 97%   Filed Weights   03/06/19 2032 03/07/19 0500  Weight: 218 lb 14.7 oz (99.3 kg) 221 lb 5.5 oz (100.4 kg)    Intake/Output from previous day: No intake/output data recorded.  GENERAL:alert, appears short of breath EYES: normal, Conjunctiva are pink and non-injected, sclera clear OROPHARYNX: No thrush or mucositis LUNGS: Diminished breath sounds to the bilateral bases.  Wheezing and rales noted in the upper lung fields. HEART: Tachycardic, regular rhythm, no lower extremity edema ABDOMEN:abdomen soft, non-tender and normal bowel sounds Musculoskeletal:no cyanosis of digits and no clubbing  NEURO: alert & oriented x 3 with fluent speech, no focal motor/sensory deficits  LABORATORY DATA:  I have reviewed the data as listed CMP Latest Ref Rng & Units 03/08/2019 03/07/2019 03/06/2019  Glucose 70 - 99 mg/dL 110(H) 96 95  BUN 6 - 20 mg/dL _0 Creatinine 0.44 - 1.00 mg/dL 0.86 0.82 0.89  Sodium 135 - 145 mmol/L 138 140 142  Potassium 3.5 - 5.1 mmol/L 3.5 3.9 4.2  Chloride 98 - 111 mmol/L 98 98 102  CO2 22 - 32 mmol/L 30 32 29  Calcium 8.9 - 10.3  mg/dL 8.8(L) 9.0 9.2  Total Protein 6.5 - 8.1 g/dL - 7.6 7.8  Total Bilirubin 0.3 - 1.2 mg/dL - 0.6 0.4  Alkaline Phos 38 - 126 U/L - 75 73  AST 15 - 41 U/L - 25 24  ALT 0 - 44 U/L - 13 15    Lab Results  Component Value Date   WBC 8.8 03/08/2019   HGB 9.9 (L) 03/08/2019   HCT 34.7 (L) 03/08/2019   MCV 91.1 03/08/2019   PLT  422 (H) 03/08/2019   NEUTROABS 9.2 (H) 03/06/2019    Dg Chest 1 View  Result Date: 02/19/2019 CLINICAL DATA:  Status post thoracentesis. History of breast carcinoma EXAM: CHEST  1 VIEW COMPARISON:  Jan 04, 2019 chest radiograph and chest CT angiogram December 07, 2018 FINDINGS: No pneumothorax. Moderate lower lobe consolidation and effusion present on the left. There are multiple mass lesions in the lungs consistent with metastatic foci. Heart size and pulmonary vascular normal. No adenopathy appreciable by radiography. Port-A-Cath tip is at the caval cervical junction. No bone lesions. IMPRESSION: No pneumothorax. Left lower lobe consolidation with left pleural effusion present. Multiple pulmonary metastases noted. The previously noted adenopathy seen on CT examination is not appreciable by radiography. Electronically Signed   By: Lowella Grip III M.D.   On: 02/19/2019 10:20   [place] Chest (pleural Effusion)  Result Date: 03/08/2019 CLINICAL DATA:  Breast carcinoma with right PleurX. Worsening opacities in the left hemithorax. Preop planning for possible left thoracentesis EXAM: CHEST ULTRASOUND COMPARISON:  Radiograph 03/06/2019 FINDINGS: No significant approachable left pleural effusion is identified. Thoracentesis deferred. IMPRESSION: No significant left effusion.  Thoracentesis deferred. Electronically Signed   By: Lucrezia Europe M.D.   On: 03/08/2019 16:22   Dg Chest Port 1 View  Result Date: 03/06/2019 CLINICAL DATA:  Abdominal distension with fluid retention and shortness of breath. Stage IV breast cancer. EXAM: PORTABLE CHEST 1 VIEW COMPARISON:  Radiographs 02/19/2019 and 01/04/2019.  CT 12/07/2018. FINDINGS: 1522 hours. Two views obtained. There are progressively lower lung volumes. The right IJ Port-A-Cath appears grossly unchanged, tip extending to the right atrial level. There is progressive opacification of left hemithorax due to a combination of left lower lobe consolidation, a loculated  pleural effusion and metastatic disease. Multiple nodules in the right lung are again noted. No pneumothorax. Telemetry leads overlie the chest. IMPRESSION: Overall worsening of the lungs with increasing pleuroparenchymal opacities in the left hemithorax, attributed to a combination of metastatic disease, loculated pleural fluid and possible posterior obstructive pneumonia. No significant changes seen in the right lung. Electronically Signed   By: Richardean Sale M.D.   On: 03/06/2019 16:44   Ir Removal Of Plural Cath W/cuff  Result Date: 02/16/2019 INDICATION: Recurrent left pleural effusion s/p Pleurx catheter placement 11/01/2018. Patient has been unable to drain her catheter for the past several weeks. Request is made for removal. EXAM: REMOVAL OF TUNNELED PLEURAL CATHETER MEDICATIONS: None ANESTHESIA/SEDATION: None FLUOROSCOPY TIME:  None COMPLICATIONS: None immediate. PROCEDURE: A sterile gown and gloves were worn during the procedure. Only a short length of catheter remained within the skin so that with removal of the overlying dressing and foam the catheter was then successfully removed in its entirety. A sterile dressing was applied over the catheter exit site. IMPRESSION: Removal of tunneled dialysis catheter utilizing sharp and blunt dissection. Read by: Brynda Greathouse PA-C Electronically Signed   By: Corrie Mckusick D.O.   On: 02/16/2019 13:52   Ir Thoracentesis Asp Pleural Space W/img  Guide  Result Date: 02/19/2019 INDICATION: Patient with history of metastatic breast cancer, dyspnea, bilateral pleural effusions, larger on right. Request received for therapeutic right thoracentesis. EXAM: ULTRASOUND GUIDED THERAPEUTIC RIGHT THORACENTESIS MEDICATIONS: None COMPLICATIONS: None immediate. PROCEDURE: An ultrasound guided thoracentesis was thoroughly discussed with the patient and questions answered. The benefits, risks, alternatives and complications were also discussed. The patient understands and  wishes to proceed with the procedure. Written consent was obtained. Ultrasound was performed to localize and mark an adequate pocket of fluid in the right chest. The area was then prepped and draped in the normal sterile fashion. 1% Lidocaine was used for local anesthesia. Under ultrasound guidance a 6 Fr Safe-T-Centesis catheter was introduced. Thoracentesis was performed. The catheter was removed and a dressing applied. FINDINGS: A total of approximately 670 cc of yellow fluid was removed. IMPRESSION: Successful ultrasound guided therapeutic right thoracentesis yielding 670 cc of pleural fluid. Read by: Rowe Robert, PA-C No pneumothorax on follow-up chest radiograph. Electronically Signed   By: Lucrezia Europe M.D.   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 Currently, her right pleural effusion is causing shortness of breath. She most recently underwent thoracentesis on 02/19/2019 with removal of 670 mL. She presents today for placement of a tunneled pleural drainage catheter. EXAM: IR PERC PLEURAL DRAIN W/INDWELL CATH W/IMG GUIDE MEDICATIONS: 2 g Ancef ANESTHESIA/SEDATION: Fentanyl 100 mcg IV; Versed 1 mg IV Moderate Sedation Time:  16 minutes The patient was continuously monitored during the procedure by the interventional radiology nurse under my direct supervision. COMPLICATIONS: None immediate. PROCEDURE: Informed written consent was obtained from the patient after a thorough discussion of the procedural risks, benefits and alternatives. All questions were addressed. Maximal Sterile Barrier Technique was utilized including caps, mask, sterile gowns, sterile gloves, sterile drape, hand hygiene and skin antiseptic. A timeout was performed prior to the initiation of the procedure. The right chest was interrogated with ultrasound. A large pleural  effusion is identified. Suitable skin entry site was selected and marked. Local anesthesia was attained by infiltration with 1% lidocaine. Under sonographic guidance, an 18 gauge sheath needle was advanced into the fluid collection. The needle portion was removed. An Amplatz wire was advanced into the pleural space. A catheter exit site 5 cm anterior and slightly inferior to the pleural entry site was selected. Local anesthesia was again attained by infiltration with 1% lidocaine. A small dermatotomy was made. The PleurX catheter was tunneled from the skin exit site to the dermatotomy overlying pleural access site. A peel-away sheath was then advanced over the wire and into the pleural space. The catheter was advanced through the peel-away sheath and the peel-away sheath was discarded. The catheter was connected to a vacuum tender bottle and 1250 mL was evacuated. An image was obtained and stored for the medical record. The dermatotomy overlying the pleural access site was closed with an inverted interrupted 4 0 Vicryl suture in the epidermis sealed with Dermabond. The catheter was secured to the skin at the exit site with an 0 Prolene suture. Sterile bandages were applied. IMPRESSION: Successful placement of a right-sided tunneled pleural drainage catheter. Initial aspiration yields 1250 mL pleural fluid. Electronically Signed   By: Jacqulynn Cadet M.D.   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  -NCCN guidelines reviewed with  the patient in detail; given the rapid disease progression, I discussed with the patient about changing treatment to Kadcyla, including its mechanism of action, efficacy and some of the common side effects -The most common adverse reactions (> 25%) with KADCYLA were fatigue, nausea, musculoskeletal pain, hemorrhage, thrombocytopenia, headache, increased transaminases, constipation and epistaxis -The patient is aware that the response rates discussed earlier is not guaranteed.   -After a long discussion, patient made an informed decision to proceed with the prescribed plan of care.  -As the patient has recently changed her insurance to [facility], we have obtained authorization to start [place] on 02/15/2019; she is status post 1 cycle of Kadcyla.  Will need to delay her second cycle secondary to hospitalization. -[id number] monitoring while on Kadcyla, next due in early 02/2019 -Antiemetics-she has PRN Zofran and Ativan available to her  Malignant left pleural effusion  -S/p Pleurx catheter placement in late 10/2018  -Review of the recent CT CAP showed enlarging bilateral pleural effusion, L > R, despite PleurX; the catheter was not visualized on CT, indicating that it likely slipped out of position -Previous Pleurx catheter was removed -She was scheduled for outpatient replacement, but the patient was late for her appointment and this could not be performed.  A thoracentesis was performed instead on 02/19/2019. -She has worsening dyspnea requiring hospitalization.  Status post right Pleurx placement. -IR unable to perform thoracentesis on left pleural effusion since fluid is loculated. -Recommend drainage of the right Pleurx catheter every other day. -We will request evaluation by cardiothoracic surgery for possible intervention of the left pleural effusion. -If no procedure is recommended, continue supportive care. -Will add Augmentin twice daily for 14 days due to concern for postobstructive  pneumonia.  Metastasis to the brain  -S/p SBRT to the brain met on 11/08/2018 -Repeat MRI brain scheduled on 03/07/2019 -[facility]  We will reschedule upon hospital discharge. -Patient denies any new focal neurologic deficits  -We will monitor it for now   Chemotherapy-associated anemia -Secondary to chemotherapy -Hemoglobin 9.9 on 03/08/2019; no CBC drawn today -Patient denies any symptom of bleeding -Patient has required periodic RBC transfusion; if anemia persists, we can consider adding Aranesp with chemotherapy  -We will monitor for now  Leukocytosis -Likely reactive in the setting of underlying malignancy -White blood cell count 8.8 on 03/08/2019 -The patient was febrile overnight and has a productive cough -Augmentin twice a day for 14 days added due to concern for postobstructive pneumonia  Thrombocytosis -Likely reactive in the setting of underlying malignancy -Platelet count 422,000 on 03/08/2019 -We will monitor it for now  Breast wound -Overall improving, followed by wound care center  -Continue wound care as instructed  Cancer-related pain -Secondary to the breast malignancy and catheter placement -On MS-Contin [id number] w/ PRN oxycodone [id number] PRN for breakthrough pain -Oxycodone has been changed to every 4 hours as needed due to increased pain to her right chest following Pleurx catheter placement    LOS: 3 days   KriMikey BussingNP, AGPCNP-BC, AOCNP 03/09/19

## 2019-03-09 NOTE — Progress Notes (Signed)
PROGRESS NOTE    Sharon Floyd  FXT:[id number] DOB: May 15, 1976 DOA: 03/06/2019 PCP: Baruch Gouty, FNP    Brief Narrative:  43 y.o. female with medical history significant for stage IV metastatic left-sided breast cancer and malignant left pleural effusion who presents to the ED for evaluation of acutely worsening dyspnea.  Patient has chronic respiratory failure with hypoxia requiring 3-4 L supplemental O2 via [place] at all times due to metastatic pulmonary disease and known metastatic left pleural effusion.  She had a Pleurx catheter in place in March 2020 which was removed on 02/16/2019.  She most recently had IR guided thoracentesis of the right side with removal of 670 cc pleural fluid.  She is planned to have repeat thoracentesis today (03/06/2019) at [facility] however had acute worsening of her shortness of breath therefore called EMS.  Her oncologist was contacted and recommended that she be brought to [facility] long ED.  Patient is currently undergoing palliative chemotherapy status post 4 cycles of THP and currently on Kadcyla.  She also has known brain metastases and underwent SBRT on 11/08/2018.  She is scheduled to have repeat MRI brain on 03/07/2019.  She currently reports orthopnea and cough productive of intermittent clear sputum.  She denies any subjective fevers, diaphoresis, chills, or abdominal pain.  She reports good urine output without dysuria.  She denies any constipation or diarrhea.  ED Course:  Initial vitals showed BP 125/92, pulse 114, RR 16, temp 99.5 Fahrenheit, SPO2 100% on nonrebreather.  Patient was transitioned to 4 L supplemental O2 via [place] saturating around 93% per EDP.  Labs notable for WBC 10.9, hemoglobin 10.4, platelets 500,000, sodium 142, potassium 4.2, bicarb 29, BUN 10, creatinine 0.89.  SARS-CoV-2 test was obtained and pending.  Portable chest x-ray showed worsening appearance of both lungs with increasing pleural-parenchymal opacities in the  left hemithorax felt due to combination of metastatic disease, loculated pleural fluid, and possible posterior obstructive pneumonia.  Right IJ Port-A-Cath appears unchanged with tip extending to the right atrial level.  EDP discussed the case with oncology who will see in a.m.  The hospitalist service was consulted to admit for further evaluation and management.   Assessment & Plan:   Principal Problem:   Acute on chronic respiratory failure with hypoxia (HCC) Active Problems:   Metastatic breast cancer (HCC)   Pleural effusion on left   Malignant neoplasm of left female breast (HCC)   Thrombocytosis (HCC)   Antineoplastic chemotherapy induced anemia   Pneumonia due to infectious organism  Acute on chronic respiratory failure with hypoxia due to pulmonary metastases and malignant pleural effusion: -Worsening opacities of the left hemithorax seen on chest x-ray.  Concern for loculated pleural fluid per radiology read.  Recently had Pleurx catheter removed from left side as patient was unable to drain catheter.  Initially required nonrebreather, currently on Evaro -Continue supplemental oxygen -IR thoracentesis requested. Pleurx placed 7/29 -Oncology following. -Failed attempt at thoracentesis on R given loculations. Appreciate input by CT surgery. 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 Maylon Peppers.  Currently receiving palliative therapy with Kadcyla. -Oncology following. Defer additional testing to Oncology service.  -Stable at present  Chronic cancer related pain: -Continue home MS Contin 30 mg every 12 hours and as needed OxyIR 10 mg every 4 hours with hold parameters. -Pt reports pain controlled  Anxiety: -Continue Ativan 0.5 mg every  6 hours as needed with hold parameters. -Seems stable this AM  Sinus tach -Tachycardic to the 120's  overnight -Likely secondary to above effusions -Have started low dose beta blocker  DVT prophylaxis: SCD's Code Status: Full Family Communication: Pt in room,family not at bedside Disposition Plan: Uncertain at this time  Consultants:   Oncology  IR  CT surgery  Procedures:   Pleurx cath placement 7/29  Attempted thoracentesis 7/30  Antimicrobials: Anti-infectives (From admission, onward)   Start     Dose/Rate Route Frequency Ordered Stop   03/09/19 1000  amoxicillin-clavulanate (AUGMENTIN) 875-125 MG per tablet 1 tablet     1 tablet Oral Every 12 hours 03/09/19 0943 03/23/19 0959   03/07/19 1400  ceFAZolin (ANCEF) 2-4 GM/100ML-% IVPB    Note to Pharmacy: Hilma Favors   : cabinet override      03/07/19 1400 03/07/19 1410      Subjective: Still complaining of subjective sob  Objective: Vitals:   03/09/19 0900 03/09/19 1100 03/09/19 1132 03/09/19 1200  BP: 123/77 (!) 143/89  135/90  Pulse: (!) 116 (!) 113  (!) 118  Resp: 19 (!) 21  (!) 22  Temp:   98.4 F (36.9 C)   TempSrc:   Oral   SpO2: 96% 97%  95%  Weight:      Height:       No intake or output data in the 24 hours ending 03/09/19 1414 Filed Weights   03/06/19 2032 03/07/19 0500  Weight: 99.3 kg 100.4 kg    Examination: General exam: Conversant, appears sob and somewhat uncomfortable Respiratory system: normal chest rise, clear, no audible wheezing Cardiovascular system: regular rhythm, s1-s2 Gastrointestinal system: Nondistended, nontender, pos BS Central nervous system: No seizures, no tremors Extremities: No cyanosis, no joint deformities Skin: No rashes, no pallor Psychiatry: Affect normal // no auditory hallucinations    Data Reviewed: I have personally reviewed following labs and imaging studies  CBC: Recent Labs  Lab 03/06/19 1617 03/07/19 0534 03/08/19 1055  WBC 10.9* 10.1 8.8  NEUTROABS 9.2*  --   --   HGB 10.4* 10.3* 9.9*  HCT 36.8 36.7 34.7*  MCV 91.1 90.8 91.1  PLT 500*  474* 741*   Basic Metabolic Panel: Recent Labs  Lab 03/06/19 1617 03/07/19 0534 03/08/19 1055  NA 142 140 138  K 4.2 3.9 3.5  CL 102 98 98  CO2 29 32 30  GLUCOSE 95 96 110*  BUN 10 11 12   CREATININE 0.89 0.82 0.86  CALCIUM 9.2 9.0 8.8*   GFR: Estimated Creatinine Clearance: 96.2 mL/min (by C-G formula based on SCr of 0.86 mg/dL). Liver Function Tests: Recent Labs  Lab 03/06/19 1617 03/07/19 0534  AST 24 25  ALT 15 13  ALKPHOS 73 75  BILITOT 0.4 0.6  PROT 7.8 7.6  ALBUMIN 3.0* 2.8*   No results for input(s): LIPASE, AMYLASE in the last 168 hours. No results for input(s): AMMONIA in the last 168 hours. Coagulation Profile: No results for input(s): INR, PROTIME in the last 168 hours. Cardiac Enzymes: No results for input(s): CKTOTAL, CKMB, CKMBINDEX, TROPONINI in the last 168 hours. BNP (last 3 results) No results for input(s): PROBNP in the last 8760 hours. HbA1C: No results for input(s): HGBA1C in the last 72 hours. CBG: No results for input(s): GLUCAP in the last 168 hours. Lipid Profile: No results for input(s): CHOL, HDL, LDLCALC, TRIG, CHOLHDL, LDLDIRECT in the last 72 hours. Thyroid Function Tests: No results for input(s): TSH, T4TOTAL, FREET4, T3FREE,  THYROIDAB in the last 72 hours. Anemia Panel: No results for input(s): VITAMINB12, FOLATE, FERRITIN, TIBC, IRON, RETICCTPCT in the last 72 hours. Sepsis Labs: No results for input(s): PROCALCITON, LATICACIDVEN in the last 168 hours.  Recent Results (from the past 240 hour(s))  SARS Coronavirus 2 (CEPHEID - Performed in [place] hospital lab), Hosp Order     Status: None   Collection Time: 03/06/19  6:15 PM   Specimen: Nasopharyngeal Swab  Result Value Ref Range Status   SARS Coronavirus 2 NEGATIVE NEGATIVE Final    Comment: (NOTE) If result is NEGATIVE SARS-CoV-2 target nucleic acids are NOT DETECTED. The SARS-CoV-2 RNA is generally detectable in upper and lower  respiratory specimens during the  acute phase of infection. The lowest  concentration of SARS-CoV-2 viral copies this assay can detect is 250  copies / mL. A negative result does not preclude SARS-CoV-2 infection  and should not be used as the sole basis for treatment or other  patient management decisions.  A negative result may occur with  improper specimen collection / handling, submission of specimen other  than nasopharyngeal swab, presence of viral mutation(s) within the  areas targeted by this assay, and inadequate number of viral copies  (<250 copies / mL). A negative result must be combined with clinical  observations, patient history, and epidemiological information. If result is POSITIVE SARS-CoV-2 target nucleic acids are DETECTED. The SARS-CoV-2 RNA is generally detectable in upper and lower  respiratory specimens dur ing the acute phase of infection.  Positive  results are indicative of active infection with SARS-CoV-2.  Clinical  correlation with patient history and other diagnostic information is  necessary to determine patient infection status.  Positive results do  not rule out bacterial infection or co-infection with other viruses. If result is PRESUMPTIVE POSTIVE SARS-CoV-2 nucleic acids MAY BE PRESENT.   A presumptive positive result was obtained on the submitted specimen  and confirmed on repeat testing.  While 2019 novel coronavirus  (SARS-CoV-2) nucleic acids may be present in the submitted sample  additional confirmatory testing may be necessary for epidemiological  and / or clinical management purposes  to differentiate between  SARS-CoV-2 and other Sarbecovirus currently known to infect humans.  If clinically indicated additional testing with an alternate test  methodology [phone]) is advised. The SARS-CoV-2 RNA is generally  detectable in upper and lower respiratory sp ecimens during the acute  phase of infection. The expected result is Negative. Fact Sheet for Patients:   StrictlyIdeas.no Fact Sheet for Healthcare Providers: BankingDealers.co.za This test is not yet approved or cleared by the [place] FDA and has been authorized for detection and/or diagnosis of SARS-CoV-2 by FDA under an Emergency Use Authorization (EUA).  This EUA will remain in effect (meaning this test can be used) for the duration of the COVID-19 declaration under Section 564(b)(1) of the Act, 21 U.S.C. section 360bbb-3(b)(1), unless the authorization is terminated or revoked sooner. Performed at [facility], [place] [address] [place], [place] [address]   MRSA PCR Screening     Status: Abnormal   Collection Time: 03/06/19  8:39 PM   Specimen: Nasal Mucosa; Nasopharyngeal  Result Value Ref Range Status   MRSA by PCR POSITIVE (A) NEGATIVE Final    Comment:        The GeneXpert MRSA Assay (FDA approved for NASAL specimens only), is one component of a comprehensive MRSA colonization surveillance program. It is not intended to diagnose MRSA infection nor to guide or monitor  treatment for MRSA infections. RESULT CALLED TO, READ BACK BY AND VERIFIED WITH: HOPPER,M @ 0337 ON [id number] BY POTEAT,S Performed at [facility] [address], [place], [place] [address]      Radiology Studies: [place] Chest (pleural Effusion)  Result Date: 03/08/2019 CLINICAL DATA:  Breast carcinoma with right PleurX. Worsening opacities in the left hemithorax. Preop planning for possible left thoracentesis EXAM: CHEST ULTRASOUND COMPARISON:  Radiograph 03/06/2019 FINDINGS: No significant approachable left pleural effusion is identified. Thoracentesis deferred. IMPRESSION: No significant left effusion.  Thoracentesis deferred. Electronically Signed   By: Lucrezia Europe M.D.   On: 03/08/2019 16:22   Ir Perc Pleural Drain W/indwell Cath W/img Guide  Result Date: 03/07/2019 INDICATION: 43 year old female with left inflammatory  breast cancer and bilateral malignant pleural effusions. Currently, her right pleural effusion is causing shortness of breath. She most recently underwent thoracentesis on 02/19/2019 with removal of 670 mL. She presents today for placement of a tunneled pleural drainage catheter. EXAM: IR PERC PLEURAL DRAIN W/INDWELL CATH W/IMG GUIDE MEDICATIONS: 2 g Ancef ANESTHESIA/SEDATION: Fentanyl 100 mcg IV; Versed 1 mg IV Moderate Sedation Time:  16 minutes The patient was continuously monitored during the procedure by the interventional radiology nurse under my direct supervision. COMPLICATIONS: None immediate. PROCEDURE: Informed written consent was obtained from the patient after a thorough discussion of the procedural risks, benefits and alternatives. All questions were addressed. Maximal Sterile Barrier Technique was utilized including caps, mask, sterile gowns, sterile gloves, sterile drape, hand hygiene and skin antiseptic. A timeout was performed prior to the initiation of the procedure. The right chest was interrogated with ultrasound. A large pleural effusion is identified. Suitable skin entry site was selected and marked. Local anesthesia was attained by infiltration with 1% lidocaine. Under sonographic guidance, an 18 gauge sheath needle was advanced into the fluid collection. The needle portion was removed. An Amplatz wire was advanced into the pleural space. A catheter exit site 5 cm anterior and slightly inferior to the pleural entry site was selected. Local anesthesia was again attained by infiltration with 1% lidocaine. A small dermatotomy was made. The PleurX catheter was tunneled from the skin exit site to the dermatotomy overlying pleural access site. A peel-away sheath was then advanced over the wire and into the pleural space. The catheter was advanced through the peel-away sheath and the peel-away sheath was discarded. The catheter was connected to a vacuum tender bottle and 1250 mL was evacuated. An  image was obtained and stored for the medical record. The dermatotomy overlying the pleural access site was closed with an inverted interrupted 4 0 Vicryl suture in the epidermis sealed with Dermabond. The catheter was secured to the skin at the exit site with an 0 Prolene suture. Sterile bandages were applied. IMPRESSION: Successful placement of a right-sided tunneled pleural drainage catheter. Initial aspiration yields 1250 mL pleural fluid. Electronically Signed   By: Jacqulynn Cadet M.D.   On: 03/07/2019 18:34    Scheduled Meds:  amoxicillin-clavulanate  1 tablet Oral Q12H   Chlorhexidine Gluconate Cloth  6 each Topical Daily   Chlorhexidine Gluconate Cloth  6 each Topical Q0600   gabapentin  600 mg Oral BID   guaiFENesin  600 mg Oral BID   mouth rinse  15 mL Mouth Rinse BID   metoprolol tartrate  12.5 mg Oral BID   morphine  30 mg Oral Q12H   mupirocin ointment  1 application Nasal BID   sodium chloride (PF)  sodium chloride flush  3 mL Intravenous Q12H   Continuous Infusions:   LOS: 3 days   Marylu Lund, MD [facility] Pager On Amion  If 7PM-7AM, please contact night-coverage 03/09/2019, 2:14 PM

## 2019-03-09 NOTE — Progress Notes (Signed)
Pleurex was drained and put out 950 mls of pink tinged yellow drainage. Dressing was changed per this RN.

## 2019-03-10 DIAGNOSIS — D6481 Anemia due to antineoplastic chemotherapy: Secondary | ICD-10-CM

## 2019-03-10 DIAGNOSIS — T451X5A Adverse effect of antineoplastic and immunosuppressive drugs, initial encounter: Secondary | ICD-10-CM

## 2019-03-10 MED ORDER — METOPROLOL TARTRATE 25 MG PO TABS: 12.5000 mg | ORAL_TABLET | Freq: Two times a day (BID) | ORAL | 1 refills | Status: AC

## 2019-03-10 MED ORDER — LACTULOSE 10 GM/15ML PO SOLN
20.0000 g | Freq: Once | ORAL | Status: AC
  Administered 2019-03-10: 12:00:00 20 g via ORAL
  Filled 2019-03-10: qty 30

## 2019-03-10 MED ORDER — OXYCODONE HCL 5 MG PO TABS: 10.0000 mg | ORAL_TABLET | Freq: Four times a day (QID) | ORAL | 0 refills | Status: DC | PRN

## 2019-03-10 MED ORDER — AMOXICILLIN-POT CLAVULANATE 875-125 MG PO TABS: 1.0000 | ORAL_TABLET | Freq: Two times a day (BID) | ORAL | 0 refills | Status: AC

## 2019-03-10 NOTE — Progress Notes (Signed)
Patient removing oxygen while asleep and desats to 81%.  Recovers to 96% when replaced on 4L nasal cannula.

## 2019-03-10 NOTE — Discharge Summary (Addendum)
Physician Discharge Summary  Sharon Floyd JKD:[id number] DOB: 1975/12/21 DOA: 03/06/2019  PCP: Baruch Gouty, FNP  Admit date: 03/06/2019 Discharge date: 03/11/2019  Admitted From: Home Disposition:  Home  Recommendations for Outpatient Follow-up:  1. Follow up with PCP in 1-2 weeks 2. Follow up with Oncology as scheduled  Home Health:PT  Equipment/Devices:3 in 1, rolling walker    Discharge Condition:Improved CODE STATUS:Full Diet recommendation: Regular   Brief/Interim Summary: 43 y.o.femalewith medical history significant forstage IV metastatic left-sided breast cancer and malignant left pleural effusion who presents to the ED for evaluation ofacutely worsening dyspnea.  Patient has chronic respiratory failure with hypoxia requiring 3-4 L supplemental O2 via [place] at all times due to metastatic pulmonary disease and known metastatic left pleural effusion. She had a Pleurx catheter in place in March 2020 which was removed on 02/16/2019. She most recently had IR guided thoracentesis of the right side with removal of 670 cc pleural fluid. She is planned to have repeat thoracentesis today (03/06/2019) at [facility] however had acute worsening of her shortness of breath therefore called EMS. Her oncologist was contacted and recommended that she be brought to [facility] long ED.  Patient is currently undergoing palliative chemotherapy status post 4 cycles of THP and currently on Kadcyla. She also has known brain metastases and underwent SBRT on 11/08/2018. She is scheduled to have repeat MRI brain on 03/07/2019.  She currently reports orthopnea and cough productive of intermittent clear sputum. She denies any subjective fevers, diaphoresis, chills, or abdominal pain. She reports good urine output without dysuria. She denies any constipation or diarrhea.  ED Course: Initial vitals showed BP 125/92, pulse 114, RR 16, temp 99.5 Fahrenheit, SPO2 100% on nonrebreather. Patient  was transitioned to 4 L supplemental O2 via [place] saturating around 93% per EDP.  Labs notable for WBC 10.9, hemoglobin 10.4, platelets 500,000, sodium 142, potassium 4.2, bicarb 29, BUN 10, creatinine 0.89.  SARS-CoV-2 test was obtained and pending.  Portable chest x-ray showed worsening appearance of both lungs with increasing pleural-parenchymal opacities in the left hemithorax felt due to combination of metastatic disease, loculated pleural fluid, and possible posterior obstructive pneumonia. Right IJ Port-A-Cath appears unchanged with tip extending to the right atrial level.  EDP discussed the case with oncology who will see in a.m. The hospitalist service was consulted to admit for further evaluation and management.   Discharge Diagnoses:  Principal Problem:   Acute on chronic respiratory failure with hypoxia (HCC) Active Problems:   Metastatic breast cancer (HCC)   Pleural effusion on left   Malignant neoplasm of left female breast (HCC)   Thrombocytosis (HCC)   Antineoplastic chemotherapy induced anemia   Pneumonia due to infectious organism  Acute on chronic respiratory failure with hypoxia due to pulmonary metastases and malignant pleural effusion: -Worsening opacities of the left hemithorax seen on chest x-ray. Concern for loculated pleural fluid per radiology read. Recently had Pleurx catheter removed from left side as patient was unable to drain catheter. Initially required nonrebreather, currently on [place] -IR thoracentesis requested. Pleurx placed 7/29 -Oncology following. -Failed attempt at thoracentesis on R given loculations. Appreciate input by CT surgery.  -Personally reviewed chest CT with patient. Multiple progressing and new metastatic lesions, including throughout the skeleton noted. On L, large rind noted. 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 -Oncology following. Defer additional testing to Oncology  service.  -remains stable  Chroniccancer relatedpain: -Continue home MS Contin 30 mg every 12 hours and as needed OxyIR 10 mg every 4 hours with hold parameters. -Pain seems controlled currently  Anxiety: -Continue Ativan 0.5 mg every 6 hours as needed with hold parameters. -Seems stable today  Sinus tach -Likely secondary to above effusions -Have started low dose beta blocker, HR stable in the 110's to 120's   Discharge Instructions   Allergies as of 03/11/2019      Reactions   Aspirin Other (See Comments)   CHILDHOOD REACTION:UNKNOWN      Medication List    STOP taking these medications   ado-trastuzumab emtansine 360 mg in sodium chloride 0.9 % 250 mL   dexamethasone 4 MG tablet Commonly known as: DECADRON     TAKE these medications   amoxicillin-clavulanate 875-125 MG tablet Commonly known as: AUGMENTIN Take 1 tablet by mouth every 12 (twelve) hours for 5 days.   gabapentin 300 MG capsule Commonly known as: NEURONTIN TAKE 2 CAPSULES (600 MG TOTAL) BY MOUTH 2 (TWO) TIMES DAILY.   guaiFENesin 600 MG 12 hr tablet Commonly known as: MUCINEX Take 600 mg by mouth 2 (two) times daily.   lidocaine-prilocaine cream Commonly known as: EMLA Apply to affected area once   LORazepam 0.5 MG tablet Commonly known as: Ativan Take 1 tablet (0.5 mg total) by mouth every 6 (six) hours as needed (Nausea or vomiting).   metoprolol tartrate 25 MG tablet Commonly known as: LOPRESSOR Take 0.5 tablets (12.5 mg total) by mouth 2 (two) times daily.   morphine 30 MG 12 hr tablet Commonly known as: MS CONTIN Take 1 tablet (30 mg total) by mouth every 12 (twelve) hours.   ondansetron 8 MG tablet Commonly known as: Zofran Take 1 tablet (8 mg total) by mouth 2 (two) times daily as needed for refractory nausea / vomiting.   oxyCODONE 5 MG immediate  release tablet Commonly known as: Oxy IR/ROXICODONE Take 2 tablets (10 mg total) by mouth every 6 (six) hours as needed for severe pain.   prochlorperazine 10 MG tablet Commonly known as: COMPAZINE Take 1 tablet (10 mg total) by mouth every 6 (six) hours as needed (Nausea or vomiting).   UNABLE TO FIND As per Medical necessity, Mastectomy Bra Q#2-Dx Code C50.812, Z17.1 Malignant neoplasm of overlapping sites of left breast in female, estrogen receptor negative            Durable Medical Equipment  (From admission, onward)         Start     Ordered   03/10/19 1617  For home use only DME Walker rolling  Once    Question:  Patient needs a walker to treat with the following condition  Answer:  Metastatic breast cancer ([place])   03/10/19 1616   03/10/19 1617  For home use only DME 3 n 1  Once     03/10/19 1617         Follow-up Information    Rakes, Connye Burkitt, FNP. Schedule an appointment as soon as possible for a visit in 2 week(s).   Specialty: Family Medicine Contact information: [place] [address] [phone]        Tish Men, MD Follow up.   Specialties: Hematology, Oncology Why: as scheduled Contact information: [place] [address] [phone]        [facility] Follow up.   Specialty: Home Health Services Why: agency will provide home health  physical therapy. agency will call you to schedule first visit. Contact information: [address]  [place] [address] [phone]          Allergies  Allergen Reactions  . Aspirin Other (See Comments)    CHILDHOOD REACTION:UNKNOWN    Consultations:  Oncology  CT Surgery  IR  Procedures/Studies: Dg Chest 1 View  Result Date: 02/19/2019 CLINICAL DATA:  Status post thoracentesis. History of breast carcinoma EXAM: CHEST  1 VIEW COMPARISON:  Jan 04, 2019 chest radiograph and chest CT angiogram December 07, 2018 FINDINGS: No pneumothorax. Moderate lower lobe  consolidation and effusion present on the left. There are multiple mass lesions in the lungs consistent with metastatic foci. Heart size and pulmonary vascular normal. No adenopathy appreciable by radiography. Port-A-Cath tip is at the caval cervical junction. No bone lesions. IMPRESSION: No pneumothorax. Left lower lobe consolidation with left pleural effusion present. Multiple pulmonary metastases noted. The previously noted adenopathy seen on CT examination is not appreciable by radiography. Electronically Signed   By: Lowella Grip III M.D.   On: 02/19/2019 10:20   Ct Chest W Contrast  Result Date: 03/09/2019 CLINICAL DATA:  Acutely worsening dyspnea. Metastatic left-sided breast cancer. Malignant left pleural effusion. EXAM: CT CHEST WITH CONTRAST TECHNIQUE: Multidetector CT imaging of the chest was performed during intravenous contrast administration. CONTRAST:  55mL OMNIPAQUE IOHEXOL 300 MG/ML  SOLN COMPARISON:  02/06/2019 FINDINGS: Cardiovascular: Right Port-A-Cath tip: Cavoatrial junction. Right pleural drainage catheter place. The previous left pleural drainage catheter is no longer appreciable. Please note that today's exam has relatively dilute contrast medium in the pulmonary arteries, and is not sensitive for pulmonary embolus. There is some narrowing of the pulmonary arteries, especially on the left side, likely from hypoaeration mediated vasoconstriction. Mediastinum/Nodes: Pathologic bilateral supraclavicular, upper mediastinal, right internal mammary, pericardial, prevascular, right hilar, subcarinal, and right axillary adenopathy observed and significantly worsened from 12/07/2018. Left supraclavicular node 2.0 cm in short axis on image 15/2, previously 1.1 cm. One of many enlarged right axillary lymph nodes measures 3.2 cm in short axis on image 38/2, formerly 1.9 cm. Scattered new and enlarged prevascular and pericardial lymph nodes. New left paratracheal lymph nodes are present.  Lungs/Pleura: Thick pleural rind on the left, potentially from tumor. Consolidation in most of the left lower lobe and in the lingula and portions of the left upper lobe, increased from prior. There is little in the way of aerated lung on the left. Scattered pulmonary nodules are visible in the aerated portion of the right lung. For example, a pulmonary nodule in the right middle lobe measures 3.7 cm in thickness on image 65/5, formerly 2.5 cm. A mass posteriorly in the right lower lobe measures 3.2 by 2.4 cm, formerly 3.0 by 2.4 cm. Most of the scattered nodules are roughly similar to prior in the right lung. However, despite the presence of the right chest tube there is a moderate to large right pleural effusion. The pleural effusion was previously only trace in size. Upper Abdomen: Excreted contrast medium is present in the left collecting system. There is retrocrural adenopathy obscuring fat planes in the retrocrural space as well as a left periaortic lymph node on image 143/2 measuring 1.5 cm in diameter, compatible with upper abdominal malignancy. Musculoskeletal: Extensive subcutaneous edema in the left breast and left chest wall, and to a lesser extent in the right breast and right chest wall. Suspected tumor tracking in the left pectoralis major muscle. Nodularity suspicious for tumor tracking in the  right upper breast medially. New and enlarging sclerotic metastatic lesions are scattered in the skeleton. IMPRESSION: 1. Extensive progression of malignancy with extensive adenopathy in the chest and lower neck 2. New and enlarging sclerotic metastatic lesions scattered in the skeleton. 3. Thick pleural rind on the left, likely from tumor, with consolidation of most of the left upper lobe, and of the lingula, and of the periphery of portions of the left upper lobe. 4. Stable to mildly increasing scattered pulmonary nodules and masses on the right. Enlarging right pleural effusion, now large, previously trace.  5. Worsening chest wall and breast edema, left greater than right. I do not see obvious venous occlusion and accordingly this is probably from apparent lymphatic drainage. 6. Probable tumor in the left pectoralis muscle and arborizing in the right medial breast. Upper abdominal retroperitoneal adenopathy compatible with metastatic spread. Electronically Signed   By: Van Clines M.D.   On: 03/09/2019 14:51   [place] Chest (pleural Effusion)  Result Date: 03/08/2019 CLINICAL DATA:  Breast carcinoma with right PleurX. Worsening opacities in the left hemithorax. Preop planning for possible left thoracentesis EXAM: CHEST ULTRASOUND COMPARISON:  Radiograph 03/06/2019 FINDINGS: No significant approachable left pleural effusion is identified. Thoracentesis deferred. IMPRESSION: No significant left effusion.  Thoracentesis deferred. Electronically Signed   By: Lucrezia Europe M.D.   On: 03/08/2019 16:22   Dg Chest Port 1 View  Result Date: 03/06/2019 CLINICAL DATA:  Abdominal distension with fluid retention and shortness of breath. Stage IV breast cancer. EXAM: PORTABLE CHEST 1 VIEW COMPARISON:  Radiographs 02/19/2019 and 01/04/2019.  CT 12/07/2018. FINDINGS: 1522 hours. Two views obtained. There are progressively lower lung volumes. The right IJ Port-A-Cath appears grossly unchanged, tip extending to the right atrial level. There is progressive opacification of left hemithorax due to a combination of left lower lobe consolidation, a loculated pleural effusion and metastatic disease. Multiple nodules in the right lung are again noted. No pneumothorax. Telemetry leads overlie the chest. IMPRESSION: Overall worsening of the lungs with increasing pleuroparenchymal opacities in the left hemithorax, attributed to a combination of metastatic disease, loculated pleural fluid and possible posterior obstructive pneumonia. No significant changes seen in the right lung. Electronically Signed   By: Richardean Sale M.D.   On:  03/06/2019 16:44   Ir Removal Of Plural Cath W/cuff  Result Date: 02/16/2019 INDICATION: Recurrent left pleural effusion s/p Pleurx catheter placement 11/01/2018. Patient has been unable to drain her catheter for the past several weeks. Request is made for removal. EXAM: REMOVAL OF TUNNELED PLEURAL CATHETER MEDICATIONS: None ANESTHESIA/SEDATION: None FLUOROSCOPY TIME:  None COMPLICATIONS: None immediate. PROCEDURE: A sterile gown and gloves were worn during the procedure. Only a short length of catheter remained within the skin so that with removal of the overlying dressing and foam the catheter was then successfully removed in its entirety. A sterile dressing was applied over the catheter exit site. IMPRESSION: Removal of tunneled dialysis catheter utilizing sharp and blunt dissection. Read by: Brynda Greathouse PA-C Electronically Signed   By: Corrie Mckusick D.O.   On: 02/16/2019 13:52   Ir Thoracentesis Asp Pleural Space W/img Guide  Result Date: 02/19/2019 INDICATION: Patient with history of metastatic breast cancer, dyspnea, bilateral pleural effusions, larger on right. Request received for therapeutic right thoracentesis. EXAM: ULTRASOUND GUIDED THERAPEUTIC RIGHT THORACENTESIS MEDICATIONS: None COMPLICATIONS: None immediate. PROCEDURE: An ultrasound guided thoracentesis was thoroughly discussed with the patient and questions answered. The benefits, risks, alternatives and complications were also discussed. The patient understands and wishes to  proceed with the procedure. Written consent was obtained. Ultrasound was performed to localize and mark an adequate pocket of fluid in the right chest. The area was then prepped and draped in the normal sterile fashion. 1% Lidocaine was used for local anesthesia. Under ultrasound guidance a 6 Fr Safe-T-Centesis catheter was introduced. Thoracentesis was performed. The catheter was removed and a dressing applied. FINDINGS: A total of approximately 670 cc of yellow  fluid was removed. IMPRESSION: Successful ultrasound guided therapeutic right thoracentesis yielding 670 cc of pleural fluid. Read by: Rowe Robert, PA-C No pneumothorax on follow-up chest radiograph. Electronically Signed   By: Lucrezia Europe M.D.   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 Currently, her right pleural effusion is causing shortness of breath. She most recently underwent thoracentesis on 02/19/2019 with removal of 670 mL. She presents today for placement of a tunneled pleural drainage catheter. EXAM: IR PERC PLEURAL DRAIN W/INDWELL CATH W/IMG GUIDE MEDICATIONS: 2 g Ancef ANESTHESIA/SEDATION: Fentanyl 100 mcg IV; Versed 1 mg IV Moderate Sedation Time:  16 minutes The patient was continuously monitored during the procedure by the interventional radiology nurse under my direct supervision. COMPLICATIONS: None immediate. PROCEDURE: Informed written consent was obtained from the patient after a thorough discussion of the procedural risks, benefits and alternatives. All questions were addressed. Maximal Sterile Barrier Technique was utilized including caps, mask, sterile gowns, sterile gloves, sterile drape, hand hygiene and skin antiseptic. A timeout was performed prior to the initiation of the procedure. The right chest was interrogated with ultrasound. A large pleural effusion is identified. Suitable skin entry site was selected and marked. Local anesthesia was attained by infiltration with 1% lidocaine. Under sonographic guidance, an 18 gauge sheath needle was advanced into the fluid collection. The needle portion was removed. An Amplatz wire was advanced into the pleural space. A catheter exit site 5 cm anterior and slightly inferior to the pleural entry site was selected. Local anesthesia was again attained by infiltration with 1%  lidocaine. A small dermatotomy was made. The PleurX catheter was tunneled from the skin exit site to the dermatotomy overlying pleural access site. A peel-away sheath was then advanced over the wire and into the pleural space. The catheter was advanced through the peel-away sheath and the peel-away sheath was discarded. The catheter was connected to a vacuum tender bottle and 1250 mL was evacuated. An image was obtained and stored for the medical record. The dermatotomy overlying the pleural access site was closed with an inverted interrupted 4 0 Vicryl suture in the epidermis sealed with Dermabond. The catheter was secured to the skin at the exit site with an 0 Prolene suture. Sterile bandages were applied. IMPRESSION: Successful placement of a right-sided tunneled pleural drainage catheter. Initial aspiration yields 1250 mL pleural fluid. Electronically Signed   By: Jacqulynn Cadet M.D.   On: 03/07/2019 18:34    Subjective: Eager to go home  Discharge Exam: Vitals:   03/11/19 1237 03/11/19 1300  BP: (!) 109/52 101/69  Pulse: (!) 103 (!) 104  Resp: (!) 22 (!) 22  Temp:    SpO2: 93% 95%   Vitals:   03/11/19 1000 03/11/19 1200 03/11/19 1237 03/11/19 1300  BP: 99/66  (!) 109/52 101/69  Pulse: 96  (!) 103 (!) 104  Resp:   (!) 22 (!) 22  Temp:  99.4 F (37.4 C)  TempSrc:  Oral    SpO2: 94%  93% 95%  Weight:      Height:        General: Pt is alert, awake, not in acute distress Cardiovascular: RRR, S1/S2 +, no rubs, no gallops Respiratory: CTA bilaterally, no wheezing, no rhonchi Abdominal: Soft, NT, ND, bowel sounds + Extremities: no edema, no cyanosis   The results of significant diagnostics from this hospitalization (including imaging, microbiology, ancillary and laboratory) are listed below for reference.     Microbiology: Recent Results (from the past 240 hour(s))  SARS Coronavirus 2 (CEPHEID - Performed in [place] hospital lab), Hosp Order     Status: None    Collection Time: 03/06/19  6:15 PM   Specimen: Nasopharyngeal Swab  Result Value Ref Range Status   SARS Coronavirus 2 NEGATIVE NEGATIVE Final    Comment: (NOTE) If result is NEGATIVE SARS-CoV-2 target nucleic acids are NOT DETECTED. The SARS-CoV-2 RNA is generally detectable in upper and lower  respiratory specimens during the acute phase of infection. The lowest  concentration of SARS-CoV-2 viral copies this assay can detect is 250  copies / mL. A negative result does not preclude SARS-CoV-2 infection  and should not be used as the sole basis for treatment or other  patient management decisions.  A negative result may occur with  improper specimen collection / handling, submission of specimen other  than nasopharyngeal swab, presence of viral mutation(s) within the  areas targeted by this assay, and inadequate number of viral copies  (<250 copies / mL). A negative result must be combined with clinical  observations, patient history, and epidemiological information. If result is POSITIVE SARS-CoV-2 target nucleic acids are DETECTED. The SARS-CoV-2 RNA is generally detectable in upper and lower  respiratory specimens dur ing the acute phase of infection.  Positive  results are indicative of active infection with SARS-CoV-2.  Clinical  correlation with patient history and other diagnostic information is  necessary to determine patient infection status.  Positive results do  not rule out bacterial infection or co-infection with other viruses. If result is PRESUMPTIVE POSTIVE SARS-CoV-2 nucleic acids MAY BE PRESENT.   A presumptive positive result was obtained on the submitted specimen  and confirmed on repeat testing.  While 2019 novel coronavirus  (SARS-CoV-2) nucleic acids may be present in the submitted sample  additional confirmatory testing may be necessary for epidemiological  and / or clinical management purposes  to differentiate between  SARS-CoV-2 and other Sarbecovirus  currently known to infect humans.  If clinically indicated additional testing with an alternate test  methodology [phone]) is advised. The SARS-CoV-2 RNA is generally  detectable in upper and lower respiratory sp ecimens during the acute  phase of infection. The expected result is Negative. Fact Sheet for Patients:  StrictlyIdeas.no Fact Sheet for Healthcare Providers: BankingDealers.co.za This test is not yet approved or cleared by the [place] FDA and has been authorized for detection and/or diagnosis of SARS-CoV-2 by FDA under an Emergency Use Authorization (EUA).  This EUA will remain in effect (meaning this test can be used) for the duration of the COVID-19 declaration under Section 564(b)(1) of the Act, 21 U.S.C. section 360bbb-3(b)(1), unless the authorization is terminated or revoked sooner. Performed at [facility], [place] [address], [place], [place] [address]   MRSA PCR Screening     Status: Abnormal   Collection Time: 03/06/19  8:39 PM   Specimen: Nasal Mucosa; Nasopharyngeal  Result Value Ref Range Status  MRSA by PCR POSITIVE (A) NEGATIVE Final    Comment:        The GeneXpert MRSA Assay (FDA approved for NASAL specimens only), is one component of a comprehensive MRSA colonization surveillance program. It is not intended to diagnose MRSA infection nor to guide or monitor treatment for MRSA infections. RESULT CALLED TO, READ BACK BY AND VERIFIED WITH: HOPPER,M @ 0337 ON [id number] BY POTEAT,S Performed at [facility] [address] [place], [place] [address]      Labs: BNP (last 3 results) Recent Labs    10/17/18 1612  BNP 54.6   Basic Metabolic Panel: Recent Labs  Lab 03/06/19 1617 03/07/19 0534 03/08/19 1055  NA 142 140 138  K 4.2 3.9 3.5  CL 102 98 98  CO2 29 32 30  GLUCOSE 95 96 110*  BUN 10 11 12   CREATININE 0.89 0.82 0.86  CALCIUM 9.2 9.0 8.8*    Liver Function Tests: Recent Labs  Lab 03/06/19 1617 03/07/19 0534  AST 24 25  ALT 15 13  ALKPHOS 73 75  BILITOT 0.4 0.6  PROT 7.8 7.6  ALBUMIN 3.0* 2.8*   No results for input(s): LIPASE, AMYLASE in the last 168 hours. No results for input(s): AMMONIA in the last 168 hours. CBC: Recent Labs  Lab 03/06/19 1617 03/07/19 0534 03/08/19 1055  WBC 10.9* 10.1 8.8  NEUTROABS 9.2*  --   --   HGB 10.4* 10.3* 9.9*  HCT 36.8 36.7 34.7*  MCV 91.1 90.8 91.1  PLT 500* 474* 422*   Cardiac Enzymes: No results for input(s): CKTOTAL, CKMB, CKMBINDEX, TROPONINI in the last 168 hours. BNP: Invalid input(s): POCBNP CBG: No results for input(s): GLUCAP in the last 168 hours. D-Dimer No results for input(s): DDIMER in the last 72 hours. Hgb A1c No results for input(s): HGBA1C in the last 72 hours. Lipid Profile No results for input(s): CHOL, HDL, LDLCALC, TRIG, CHOLHDL, LDLDIRECT in the last 72 hours. Thyroid function studies No results for input(s): TSH, T4TOTAL, T3FREE, THYROIDAB in the last 72 hours.  Invalid input(s): FREET3 Anemia work up No results for input(s): VITAMINB12, FOLATE, FERRITIN, TIBC, IRON, RETICCTPCT in the last 72 hours. Urinalysis No results found for: COLORURINE, APPEARANCEUR, [place], [place], GLUCOSEU, [place], [place], KETONESUR, PROTEINUR, UROBILINOGEN, NITRITE, LEUKOCYTESUR Sepsis Labs Invalid input(s): PROCALCITONIN,  WBC,  LACTICIDVEN Microbiology Recent Results (from the past 240 hour(s))  SARS Coronavirus 2 (CEPHEID - Performed in [place] hospital lab), Hosp Order     Status: None   Collection Time: 03/06/19  6:15 PM   Specimen: Nasopharyngeal Swab  Result Value Ref Range Status   SARS Coronavirus 2 NEGATIVE NEGATIVE Final    Comment: (NOTE) If result is NEGATIVE SARS-CoV-2 target nucleic acids are NOT DETECTED. The SARS-CoV-2 RNA is generally detectable in upper and lower  respiratory specimens during the acute phase of infection. The  lowest  concentration of SARS-CoV-2 viral copies this assay can detect is 250  copies / mL. A negative result does not preclude SARS-CoV-2 infection  and should not be used as the sole basis for treatment or other  patient management decisions.  A negative result may occur with  improper specimen collection / handling, submission of specimen other  than nasopharyngeal swab, presence of viral mutation(s) within the  areas targeted by this assay, and inadequate number of viral copies  (<250 copies / mL). A negative result must be combined with clinical  observations, patient history, and epidemiological information. If result is POSITIVE SARS-CoV-2  target nucleic acids are DETECTED. The SARS-CoV-2 RNA is generally detectable in upper and lower  respiratory specimens dur ing the acute phase of infection.  Positive  results are indicative of active infection with SARS-CoV-2.  Clinical  correlation with patient history and other diagnostic information is  necessary to determine patient infection status.  Positive results do  not rule out bacterial infection or co-infection with other viruses. If result is PRESUMPTIVE POSTIVE SARS-CoV-2 nucleic acids MAY BE PRESENT.   A presumptive positive result was obtained on the submitted specimen  and confirmed on repeat testing.  While 2019 novel coronavirus  (SARS-CoV-2) nucleic acids may be present in the submitted sample  additional confirmatory testing may be necessary for epidemiological  and / or clinical management purposes  to differentiate between  SARS-CoV-2 and other Sarbecovirus currently known to infect humans.  If clinically indicated additional testing with an alternate test  methodology [phone]) is advised. The SARS-CoV-2 RNA is generally  detectable in upper and lower respiratory sp ecimens during the acute  phase of infection. The expected result is Negative. Fact Sheet for Patients:   StrictlyIdeas.no Fact Sheet for Healthcare Providers: BankingDealers.co.za This test is not yet approved or cleared by the [place] FDA and has been authorized for detection and/or diagnosis of SARS-CoV-2 by FDA under an Emergency Use Authorization (EUA).  This EUA will remain in effect (meaning this test can be used) for the duration of the COVID-19 declaration under Section 564(b)(1) of the Act, 21 U.S.C. section 360bbb-3(b)(1), unless the authorization is terminated or revoked sooner. Performed at [facility], [place] [address], [place], [place] [address]   MRSA PCR Screening     Status: Abnormal   Collection Time: 03/06/19  8:39 PM   Specimen: Nasal Mucosa; Nasopharyngeal  Result Value Ref Range Status   MRSA by PCR POSITIVE (A) NEGATIVE Final    Comment:        The GeneXpert MRSA Assay (FDA approved for NASAL specimens only), is one component of a comprehensive MRSA colonization surveillance program. It is not intended to diagnose MRSA infection nor to guide or monitor treatment for MRSA infections. RESULT CALLED TO, READ BACK BY AND VERIFIED WITH: HOPPER,M @ 0337 ON [id number] BY POTEAT,S Performed at [facility] [address] [place], [place] [address]    Time spent: 30 min  SIGNED:   Marylu Lund, MD  [facility] 03/11/2019, 3:13 PM  If 7PM-7AM, please contact night-coverage

## 2019-03-10 NOTE — Progress Notes (Signed)
PROGRESS NOTE    Sharon Floyd  YOV:[id number] DOB: 1976/07/01 DOA: 03/06/2019 PCP: Baruch Gouty, FNP    Brief Narrative:  43 y.o. female with medical history significant for stage IV metastatic left-sided breast cancer and malignant left pleural effusion who presents to the ED for evaluation of acutely worsening dyspnea.  Patient has chronic respiratory failure with hypoxia requiring 3-4 L supplemental O2 via [place] at all times due to metastatic pulmonary disease and known metastatic left pleural effusion.  She had a Pleurx catheter in place in March 2020 which was removed on 02/16/2019.  She most recently had IR guided thoracentesis of the right side with removal of 670 cc pleural fluid.  She is planned to have repeat thoracentesis today (03/06/2019) at [facility] however had acute worsening of her shortness of breath therefore called EMS.  Her oncologist was contacted and recommended that she be brought to [facility] long ED.  Patient is currently undergoing palliative chemotherapy status post 4 cycles of THP and currently on Kadcyla.  She also has known brain metastases and underwent SBRT on 11/08/2018.  She is scheduled to have repeat MRI brain on 03/07/2019.  She currently reports orthopnea and cough productive of intermittent clear sputum.  She denies any subjective fevers, diaphoresis, chills, or abdominal pain.  She reports good urine output without dysuria.  She denies any constipation or diarrhea.  ED Course:  Initial vitals showed BP 125/92, pulse 114, RR 16, temp 99.5 Fahrenheit, SPO2 100% on nonrebreather.  Patient was transitioned to 4 L supplemental O2 via [place] saturating around 93% per EDP.  Labs notable for WBC 10.9, hemoglobin 10.4, platelets 500,000, sodium 142, potassium 4.2, bicarb 29, BUN 10, creatinine 0.89.  SARS-CoV-2 test was obtained and pending.  Portable chest x-ray showed worsening appearance of both lungs with increasing pleural-parenchymal opacities in the  left hemithorax felt due to combination of metastatic disease, loculated pleural fluid, and possible posterior obstructive pneumonia.  Right IJ Port-A-Cath appears unchanged with tip extending to the right atrial level.  EDP discussed the case with oncology who will see in a.m.  The hospitalist service was consulted to admit for further evaluation and management.   Assessment & Plan:   Principal Problem:   Acute on chronic respiratory failure with hypoxia (HCC) Active Problems:   Metastatic breast cancer (HCC)   Pleural effusion on left   Malignant neoplasm of left female breast (HCC)   Thrombocytosis (HCC)   Antineoplastic chemotherapy induced anemia   Pneumonia due to infectious organism  Acute on chronic respiratory failure with hypoxia due to pulmonary metastases and malignant pleural effusion: -Worsening opacities of the left hemithorax seen on chest x-ray.  Concern for loculated pleural fluid per radiology read.  Recently had Pleurx catheter removed from left side as patient was unable to drain catheter.  Initially required nonrebreather, currently on  -IR thoracentesis requested. Pleurx placed 7/29 -Oncology following. -Failed attempt at thoracentesis on R given loculations. Appreciate input by CT surgery.  -Personally reviewed chest CT with patient. Multiple progressing and new metastatic lesions, including throughout the skeleton noted. On L, large rind noted. 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 Maylon Peppers.  Currently receiving palliative therapy with Kadcyla. -Oncology following. Defer additional testing to Oncology service.  -remains stable  Chronic cancer related pain: -Continue home MS Contin 30 mg every 12 hours and as needed OxyIR 10 mg every 4 hours  with hold parameters. -Pain seems controlled currently  Anxiety: -Continue Ativan 0.5 mg every 6  hours as needed with hold parameters. -Seems stable today  Sinus tach -Likely secondary to above effusions -Have started low dose beta blocker, HR stable in the 110's to 120's  DVT prophylaxis: SCD's Code Status: Full Family Communication: Pt in room,family not at bedside Disposition Plan: Uncertain at this time  Consultants:   Oncology  IR  CT surgery  Procedures:   Pleurx cath placement 7/29  Attempted thoracentesis 7/30  Antimicrobials: Anti-infectives (From admission, onward)   Start     Dose/Rate Route Frequency Ordered Stop   03/10/19 0000  amoxicillin-clavulanate (AUGMENTIN) 875-125 MG tablet     1 tablet Oral Every 12 hours 03/10/19 1620 03/15/19 2359   03/09/19 1000  amoxicillin-clavulanate (AUGMENTIN) 875-125 MG per tablet 1 tablet     1 tablet Oral Every 12 hours 03/09/19 0943 03/23/19 0959   03/07/19 1400  ceFAZolin (ANCEF) 2-4 GM/100ML-% IVPB    Note to Pharmacy: Hilma Favors   : cabinet override      03/07/19 1400 03/07/19 1410      Subjective: Reports feeling better today  Objective: Vitals:   03/10/19 1300 03/10/19 1400 03/10/19 1500 03/10/19 1600  BP: 115/73 106/71 123/72   Pulse: (!) 109 (!) 111 (!) 114   Resp: 10 13 (!) 0   Temp:    98.5 F (36.9 C)  TempSrc:    Oral  SpO2: 96% 96% 95%   Weight:      Height:        Intake/Output Summary (Last 24 hours) at 03/10/2019 1748 Last data filed at 03/10/2019 1200 Gross per 24 hour  Intake 3 ml  Output 1400 ml  Net -1397 ml   Filed Weights   03/06/19 2032 03/07/19 0500  Weight: 99.3 kg 100.4 kg    Examination: General exam: Awake, laying in bed, in nad Respiratory system: Normal respiratory effort, no wheezing Cardiovascular system: regular rate, s1, s2 Gastrointestinal system: Soft, nondistended, positive BS Central nervous system: CN2-12 grossly intact, strength intact Extremities: Perfused, no clubbing Skin: Normal skin turgor, no notable skin lesions seen Psychiatry: Mood normal  // no visual hallucinations   Data Reviewed: I have personally reviewed following labs and imaging studies  CBC: Recent Labs  Lab 03/06/19 1617 03/07/19 0534 03/08/19 1055  WBC 10.9* 10.1 8.8  NEUTROABS 9.2*  --   --   HGB 10.4* 10.3* 9.9*  HCT 36.8 36.7 34.7*  MCV 91.1 90.8 91.1  PLT 500* 474* 540*   Basic Metabolic Panel: Recent Labs  Lab 03/06/19 1617 03/07/19 0534 03/08/19 1055  NA 142 140 138  K 4.2 3.9 3.5  CL 102 98 98  CO2 29 32 30  GLUCOSE 95 96 110*  BUN 10 11 12   CREATININE 0.89 0.82 0.86  CALCIUM 9.2 9.0 8.8*   GFR: Estimated Creatinine Clearance: 96.2 mL/min (by C-G formula based on SCr of 0.86 mg/dL). Liver Function Tests: Recent Labs  Lab 03/06/19 1617 03/07/19 0534  AST 24 25  ALT 15 13  ALKPHOS 73 75  BILITOT 0.4 0.6  PROT 7.8 7.6  ALBUMIN 3.0* 2.8*   No results for input(s): LIPASE, AMYLASE in the last 168 hours. No results for input(s): AMMONIA in the last 168 hours. Coagulation Profile: No results for input(s): INR, PROTIME in the last 168 hours. Cardiac Enzymes: No results for input(s): CKTOTAL, CKMB, CKMBINDEX, TROPONINI in the last 168 hours. BNP (last 3 results) No results  for input(s): PROBNP in the last 8760 hours. HbA1C: No results for input(s): HGBA1C in the last 72 hours. CBG: No results for input(s): GLUCAP in the last 168 hours. Lipid Profile: No results for input(s): CHOL, HDL, LDLCALC, TRIG, CHOLHDL, LDLDIRECT in the last 72 hours. Thyroid Function Tests: No results for input(s): TSH, T4TOTAL, FREET4, T3FREE, THYROIDAB in the last 72 hours. Anemia Panel: No results for input(s): VITAMINB12, FOLATE, FERRITIN, TIBC, IRON, RETICCTPCT in the last 72 hours. Sepsis Labs: No results for input(s): PROCALCITON, LATICACIDVEN in the last 168 hours.  Recent Results (from the past 240 hour(s))  SARS Coronavirus 2 (CEPHEID - Performed in [place] hospital lab), Hosp Order     Status: None   Collection Time: 03/06/19  6:15 PM    Specimen: Nasopharyngeal Swab  Result Value Ref Range Status   SARS Coronavirus 2 NEGATIVE NEGATIVE Final    Comment: (NOTE) If result is NEGATIVE SARS-CoV-2 target nucleic acids are NOT DETECTED. The SARS-CoV-2 RNA is generally detectable in upper and lower  respiratory specimens during the acute phase of infection. The lowest  concentration of SARS-CoV-2 viral copies this assay can detect is 250  copies / mL. A negative result does not preclude SARS-CoV-2 infection  and should not be used as the sole basis for treatment or other  patient management decisions.  A negative result may occur with  improper specimen collection / handling, submission of specimen other  than nasopharyngeal swab, presence of viral mutation(s) within the  areas targeted by this assay, and inadequate number of viral copies  (<250 copies / mL). A negative result must be combined with clinical  observations, patient history, and epidemiological information. If result is POSITIVE SARS-CoV-2 target nucleic acids are DETECTED. The SARS-CoV-2 RNA is generally detectable in upper and lower  respiratory specimens dur ing the acute phase of infection.  Positive  results are indicative of active infection with SARS-CoV-2.  Clinical  correlation with patient history and other diagnostic information is  necessary to determine patient infection status.  Positive results do  not rule out bacterial infection or co-infection with other viruses. If result is PRESUMPTIVE POSTIVE SARS-CoV-2 nucleic acids MAY BE PRESENT.   A presumptive positive result was obtained on the submitted specimen  and confirmed on repeat testing.  While 2019 novel coronavirus  (SARS-CoV-2) nucleic acids may be present in the submitted sample  additional confirmatory testing may be necessary for epidemiological  and / or clinical management purposes  to differentiate between  SARS-CoV-2 and other Sarbecovirus currently known to infect humans.  If  clinically indicated additional testing with an alternate test  methodology [phone]) is advised. The SARS-CoV-2 RNA is generally  detectable in upper and lower respiratory sp ecimens during the acute  phase of infection. The expected result is Negative. Fact Sheet for Patients:  StrictlyIdeas.no Fact Sheet for Healthcare Providers: BankingDealers.co.za This test is not yet approved or cleared by the [place] FDA and has been authorized for detection and/or diagnosis of SARS-CoV-2 by FDA under an Emergency Use Authorization (EUA).  This EUA will remain in effect (meaning this test can be used) for the duration of the COVID-19 declaration under Section 564(b)(1) of the Act, 21 U.S.C. section 360bbb-3(b)(1), unless the authorization is terminated or revoked sooner. Performed at [facility], [place] [address] [place], [place] [address]   MRSA PCR Screening     Status: Abnormal   Collection Time: 03/06/19  8:39 PM   Specimen: Nasal Mucosa; Nasopharyngeal  Result Value Ref Range Status   MRSA by PCR POSITIVE (A) NEGATIVE Final    Comment:        The GeneXpert MRSA Assay (FDA approved for NASAL specimens only), is one component of a comprehensive MRSA colonization surveillance program. It is not intended to diagnose MRSA infection nor to guide or monitor treatment for MRSA infections. RESULT CALLED TO, READ BACK BY AND VERIFIED WITH: HOPPER,M @ 0337 ON [id number] BY POTEAT,S Performed at [facility] [address] [place], [place] [address]      Radiology Studies: Ct Chest W Contrast  Result Date: 03/09/2019 CLINICAL DATA:  Acutely worsening dyspnea. Metastatic left-sided breast cancer. Malignant left pleural effusion. EXAM: CT CHEST WITH CONTRAST TECHNIQUE: Multidetector CT imaging of the chest was performed during intravenous contrast administration. CONTRAST:  41mL OMNIPAQUE IOHEXOL 300  MG/ML  SOLN COMPARISON:  02/06/2019 FINDINGS: Cardiovascular: Right Port-A-Cath tip: Cavoatrial junction. Right pleural drainage catheter place. The previous left pleural drainage catheter is no longer appreciable. Please note that today's exam has relatively dilute contrast medium in the pulmonary arteries, and is not sensitive for pulmonary embolus. There is some narrowing of the pulmonary arteries, especially on the left side, likely from hypoaeration mediated vasoconstriction. Mediastinum/Nodes: Pathologic bilateral supraclavicular, upper mediastinal, right internal mammary, pericardial, prevascular, right hilar, subcarinal, and right axillary adenopathy observed and significantly worsened from 12/07/2018. Left supraclavicular node 2.0 cm in short axis on image 15/2, previously 1.1 cm. One of many enlarged right axillary lymph nodes measures 3.2 cm in short axis on image 38/2, formerly 1.9 cm. Scattered new and enlarged prevascular and pericardial lymph nodes. New left paratracheal lymph nodes are present. Lungs/Pleura: Thick pleural rind on the left, potentially from tumor. Consolidation in most of the left lower lobe and in the lingula and portions of the left upper lobe, increased from prior. There is little in the way of aerated lung on the left. Scattered pulmonary nodules are visible in the aerated portion of the right lung. For example, a pulmonary nodule in the right middle lobe measures 3.7 cm in thickness on image 65/5, formerly 2.5 cm. A mass posteriorly in the right lower lobe measures 3.2 by 2.4 cm, formerly 3.0 by 2.4 cm. Most of the scattered nodules are roughly similar to prior in the right lung. However, despite the presence of the right chest tube there is a moderate to large right pleural effusion. The pleural effusion was previously only trace in size. Upper Abdomen: Excreted contrast medium is present in the left collecting system. There is retrocrural adenopathy obscuring fat planes in the  retrocrural space as well as a left periaortic lymph node on image 143/2 measuring 1.5 cm in diameter, compatible with upper abdominal malignancy. Musculoskeletal: Extensive subcutaneous edema in the left breast and left chest wall, and to a lesser extent in the right breast and right chest wall. Suspected tumor tracking in the left pectoralis major muscle. Nodularity suspicious for tumor tracking in the right upper breast medially. New and enlarging sclerotic metastatic lesions are scattered in the skeleton. IMPRESSION: 1. Extensive progression of malignancy with extensive adenopathy in the chest and lower neck 2. New and enlarging sclerotic metastatic lesions scattered in the skeleton. 3. Thick pleural rind on the left, likely from tumor, with consolidation of most of the left upper lobe, and of the lingula, and of the periphery of portions of the left upper lobe. 4. Stable to mildly increasing scattered pulmonary nodules and masses on the right. Enlarging right pleural  effusion, now large, previously trace. 5. Worsening chest wall and breast edema, left greater than right. I do not see obvious venous occlusion and accordingly this is probably from apparent lymphatic drainage. 6. Probable tumor in the left pectoralis muscle and arborizing in the right medial breast. Upper abdominal retroperitoneal adenopathy compatible with metastatic spread. Electronically Signed   By: Van Clines M.D.   On: 03/09/2019 14:51    Scheduled Meds: . amoxicillin-clavulanate  1 tablet Oral Q12H  . Chlorhexidine Gluconate Cloth  6 each Topical Daily  . Chlorhexidine Gluconate Cloth  6 each Topical Q0600  . gabapentin  600 mg Oral BID  . guaiFENesin  600 mg Oral BID  . mouth rinse  15 mL Mouth Rinse BID  . metoprolol tartrate  12.5 mg Oral BID  . morphine  30 mg Oral Q12H  . mupirocin ointment  1 application Nasal BID  . sodium chloride flush  3 mL Intravenous Q12H   Continuous Infusions:   LOS: 4 days   Marylu Lund, MD [facility] Pager On Amion  If 7PM-7AM, please contact night-coverage 03/10/2019, 5:48 PM

## 2019-03-10 NOTE — Evaluation (Signed)
Physical Therapy Evaluation Patient Details Name: Sharon Floyd MRN: [id number] DOB: Oct 25, 1975 Today's Date: 03/10/2019   History of Present Illness  Pt admitted with acute on chronic respiratory failure and L pleural effusion 2* Breast CA with lung/brain mets  Clinical Impression  Pt admitted as above and presenting with functional mobility limitations 2* generalized weakness, limited endurance and ambulatory balance deficits.  Pt should progress to dc home with family assist and would benefit from follow up HHPT and use of RW and 3n1.    Follow Up Recommendations Home health PT    Equipment Recommendations  Rolling walker with 5" wheels;3in1 (PT)    Recommendations for Other Services OT consult     Precautions / Restrictions Precautions Precautions: Fall Restrictions Weight Bearing Restrictions: No      Mobility  Bed Mobility Overal bed mobility: Modified Independent             General bed mobility comments: Pt up to EOB unassisted  Transfers Overall transfer level: Needs assistance Equipment used: None Transfers: Sit to/from [employer] Transfers Sit to Stand: Min guard Stand pivot transfers: Min guard       General transfer comment: for safety/stability  Ambulation/Gait Ambulation/Gait assistance: Min assist;Min guard;Supervision Gait Distance (Feet): 200 Feet Assistive device: 1 person hand held assist;Rolling walker (2 wheeled) Gait Pattern/deviations: Step-through pattern;Decreased step length - right;Decreased step length - left;Shuffle;Drifts right/left Gait velocity: decr   General Gait Details: Increased time with muliple standing rest breaks 2* SOB;  Pt initially requiring min assist for stability with noted drift to right.  Noted improvement in stability with introduction of RW  Stairs            Wheelchair Mobility    Modified Rankin (Stroke Patients Only)       Balance Overall balance assessment: Needs  assistance Sitting-balance support: Feet supported;No upper extremity supported Sitting balance-Leahy Scale: Good     Standing balance support: No upper extremity supported Standing balance-Leahy Scale: Fair                               Pertinent Vitals/Pain Pain Assessment: Faces Faces Pain Scale: Hurts little more Pain Location: chest with coughing Pain Descriptors / Indicators: Aching;Grimacing Pain Intervention(s): Limited activity within patient's tolerance;Monitored during session    Home Living Family/patient expects to be discharged to:: Private residence Living Arrangements: Parent;Children Available Help at Discharge: Family(parents and teenage son) Type of Home: House Home Access: Stairs to enter   [employer] of Steps: 2 Home Layout: Able to live on main level with bedroom/bathroom;Two level Home Equipment: None Additional Comments: Pt would like to get back up to her bedroom on 2nd floor    Prior Function Level of Independence: Independent         Comments: Ltd by endurance.  Pt states she cooks or does very light housework but then takes several days to recover     Hand Dominance        Extremity/Trunk Assessment   Upper Extremity Assessment Upper Extremity Assessment: Generalized weakness    Lower Extremity Assessment Lower Extremity Assessment: Generalized weakness       Communication   Communication: No difficulties  Cognition Arousal/Alertness: Awake/alert Behavior During Therapy: WFL for tasks assessed/performed Overall Cognitive Status: Within Functional Limits for tasks assessed  General Comments      Exercises     Assessment/Plan    PT Assessment Patient needs continued PT services  PT Problem List Decreased strength;Decreased activity tolerance;Decreased balance;Decreased mobility;Decreased knowledge of use of DME;Obesity;Pain       PT  Treatment Interventions DME instruction;Gait training;Stair training;Functional mobility training;Therapeutic activities;Therapeutic exercise;Patient/family education;Balance training    PT Goals (Current goals can be found in the Care Plan section)  Acute Rehab PT Goals Patient Stated Goal: Home to 44 yr old son PT Goal Formulation: With patient Time For Goal Achievement: 03/24/19 Potential to Achieve Goals: Good    Frequency Min 3X/week   Barriers to discharge        Co-evaluation               AM-PAC PT "6 Clicks" Mobility  Outcome Measure Help needed turning from your back to your side while in a flat bed without using bedrails?: None Help needed moving from lying on your back to sitting on the side of a flat bed without using bedrails?: None Help needed moving to and from a bed to a chair (including a wheelchair)?: A Little Help needed standing up from a chair using your arms (e.g., wheelchair or bedside chair)?: A Little Help needed to walk in hospital room?: A Little Help needed climbing 3-5 steps with a railing? : A Lot 6 Click Score: 19    End of Session Equipment Utilized During Treatment: Gait belt;Oxygen Activity Tolerance: Patient limited by fatigue Patient left: in chair;with call bell/phone within reach;with chair alarm set Nurse Communication: Mobility status PT Visit Diagnosis: Difficulty in walking, not elsewhere classified (R26.2)    Time: 5300-5110 PT Time Calculation (min) (ACUTE ONLY): 43 min   Charges:   PT Evaluation $PT Eval Low Complexity: 1 Low PT Treatments $Gait Training: 8-22 mins        [place] Pager [phone] Office [phone]   Amir Glaus 03/10/2019, 12:14 PM

## 2019-03-10 NOTE — TOC Initial Note (Signed)
Transition of Care [facility]) - Initial/Assessment Note    Patient Details  Name: Sharon Floyd MRN: [id number] Date of Birth: [birth year]  Transition of Care (TOC) CM/SW Contact:    Joaquin Courts, RN Phone Number: 03/10/2019, 5:35 PM  Clinical Narrative:       CM called are [place] agencies and placed referrals. All agencies have turned patient down except for [facility] who cannot verify if they are in network with her insurance plan. CM unable to find a [facility] provider at this time, MD made aware. [employer] to deliver rolling walker and 3-in-1 to bedside.             Expected Discharge Plan: [place]     Patient Goals and CMS Choice        Expected Discharge Plan and Services Expected Discharge Plan: [place]   Discharge Planning Services: CM Consult   Living arrangements for the past 2 months: Single Family Home Expected Discharge Date: 03/10/19               DME Arranged: 3-N-1, Walker rolling DME Agency: [employer] Date DME Agency Contacted: 03/10/19 Time DME Agency Contacted: [phone] Representative spoke with at DME Agency: [place]            Prior Living Arrangements/Services Living arrangements for the past 2 months: [place] with:: Adult Children, Parents Patient language and need for interpreter reviewed:: Yes Do you feel safe going back to the place where you live?: Yes      Need for Family Participation in Patient Care: Yes (Comment) Care giver support system in place?: Yes (comment)   Criminal Activity/Legal Involvement Pertinent to Current Situation/Hospitalization: No - Comment as needed  Activities of Daily Living Home Assistive Devices/Equipment: Oxygen ADL Screening (condition at time of admission) Patient's cognitive ability adequate to safely complete daily activities?: No(patient very slepy but arousable) Is the patient deaf or have difficulty hearing?: No Does the patient have difficulty seeing, even  when wearing glasses/contacts?: No Does the patient have difficulty concentrating, remembering, or making decisions?: Yes Patient able to express need for assistance with ADLs?: Yes Does the patient have difficulty dressing or bathing?: Yes Independently performs ADLs?: No Communication: Independent Dressing (OT): Dependent Is this a change from baseline?: Change from baseline, expected to last >3 days Grooming: Dependent Is this a change from baseline?: Change from baseline, expected to last >3 days Feeding: Dependent Is this a change from baseline?: Change from baseline, expected to last >3 days Bathing: Dependent Is this a change from baseline?: Change from baseline, expected to last >3 days Toileting: Dependent Is this a change from baseline?: Change from baseline, expected to last >3days In/Out Bed: Dependent Is this a change from baseline?: Change from baseline, expected to last >3 days Walks in Home: Dependent Is this a change from baseline?: Change from baseline, expected to last >3 days Does the patient have difficulty walking or climbing stairs?: Yes(secondary to shortness of breath and weakness) Weakness of Legs: Both Weakness of Arms/Hands: Both  Permission Sought/Granted                  Emotional Assessment Appearance:: Appears stated age Attitude/Demeanor/Rapport: Engaged Affect (typically observed): Accepting Orientation: : Oriented to Self, Oriented to Place, Oriented to  Time, Oriented to Situation   Psych Involvement: No (comment)  Admission diagnosis:  SOB (shortness of breath) [R06.02] Pleural effusion on left [J90] Metastatic breast cancer [facility]) [C50.919] Patient Active Problem  List   Diagnosis Date Noted  . Pneumonia due to infectious organism   . Thrombocytosis ([place])   . Antineoplastic chemotherapy induced anemia   . Acute on chronic respiratory failure with hypoxia ([place]) 03/06/2019  . Genetic testing 11/16/2018  . Normocytic anemia 11/03/2018   . Goals of care, counseling/discussion 11/03/2018  . Cancer-related pain 10/24/2018  . Metastasis to brain ([place])   . Pleural effusion on left 10/18/2018  . Elevated troponin 10/18/2018  . Malignant neoplasm of left female breast ([place])   . Acute respiratory failure with hypoxia ([place]) 10/17/2018  . Family history of breast cancer   . Family history of colon cancer   . Family history of cancer 10/12/2018  . Metastatic breast cancer ([place]) 10/10/2018   PCP:  Baruch Gouty, FNP Pharmacy:   [id number] [place], [place] [address] Phone: [phone] Fax: [phone]     Social Determinants of Health (SDOH) Interventions    Readmission Risk Interventions No flowsheet data found.

## 2019-03-11 NOTE — TOC Progression Note (Signed)
Transition of Care [facility]) - Progression Note    Patient Details  Name: Sharon Floyd MRN: [id number] Date of Birth: Apr 20, 1976  Transition of Care [facility]) CM/SW Contact  Joaquin Courts, RN Phone Number: 03/11/2019, 10:30 AM  Clinical Narrative:    Patient set-up with [facility] for HHPT. DME at bedside.   Expected Discharge Plan: [place] Barriers to Discharge: Continued Medical Work up  Expected Discharge Plan and Services Expected Discharge Plan: [place]   Discharge Planning Services: CM Consult Post Acute Care Choice: [place] arrangements for the past 2 months: Single Family Home Expected Discharge Date: 03/10/19               DME Arranged: Berta Minor rolling DME Agency: [employer] Date DME Agency Contacted: 03/10/19 Time DME Agency Contacted: [phone] Representative spoke with at DME Agency: [place]: PT [place]: [facility] [employer] Date [place]: 03/11/19 Time [place]: [place] Representative spoke with at [place]: [place] ([place]) Interventions    Readmission Risk Interventions No flowsheet data found.

## 2019-03-11 NOTE — TOC Progression Note (Signed)
Transition of Care [facility]) - Progression Note    Patient Details  Name: Sharon Floyd MRN: [id number] Date of Birth: Dec 04, 1975  Transition of Care [facility]) CM/SW Contact  Joaquin Courts, RN Phone Number: 03/11/2019, 4:52 PM  Clinical Narrative:    [employer] to deliver portable tank to bedside for d/c home.   Expected Discharge Plan: [place] Barriers to Discharge: Continued Medical Work up  Expected Discharge Plan and Services Expected Discharge Plan: [place]   Discharge Planning Services: CM Consult Post Acute Care Choice: [place] arrangements for the past 2 months: Single Family Home Expected Discharge Date: 03/11/19               DME Arranged: Berta Minor rolling DME Agency: [employer] Date DME Agency Contacted: 03/10/19 Time DME Agency Contacted: [phone] Representative spoke with at DME Agency: [place]: PT [place]: [facility] [employer] Date [place]: 03/11/19 Time [place]: [place] Representative spoke with at [place]: [place] ([place]) Interventions    Readmission Risk Interventions No flowsheet data found.

## 2019-03-12 ENCOUNTER — Encounter: Payer: Self-pay | Admitting: *Deleted

## 2019-03-12 ENCOUNTER — Telehealth: Payer: BC Managed Care – PPO | Admitting: Radiation Oncology

## 2019-03-12 ENCOUNTER — Telehealth: Payer: Self-pay | Admitting: Hematology

## 2019-03-12 NOTE — Telephone Encounter (Signed)
Called and LMVM for patient with date/time of appointments added per 8/3 staff message

## 2019-03-12 NOTE — Progress Notes (Signed)
Received notification that patient is trying to apply for Medicaid. She is scheduled to come in Thursday for treatment. Sent referral to our financial counselor notifying her of patient need. She will follow up with patient on Thursday.  Called patient to notify her that I received the message and to expect follow up on Thursday.

## 2019-03-12 NOTE — Progress Notes (Signed)
[place] Work  [place] representative received referral from financial advocate at [employer] for assistance with disability.  CSW contacted patient at home to offer support and assess for needs.  CSW left patient a messaging encouraging patient to return call.    Sharon Floyd, MSW, LCSW, OSW-C Clinical Social Worker [facility] [phone]

## 2019-03-14 ENCOUNTER — Encounter: Payer: Self-pay | Admitting: *Deleted

## 2019-03-14 NOTE — Progress Notes (Signed)
[place] Clinical Social Work  [place] representative contacted patient at home to offer support and discuss resources.  CSW and patient discuss social security disability and the application process.  CSW informed patient of the [facility], who would assist patient through the application process.  Patient was agreeable to referral.  CSW completed referral and servant center staff will follow up.      Johnnye Lana, MSW, LCSW, OSW-C Clinical Social Worker [facility] [phone]

## 2019-03-15 ENCOUNTER — Inpatient Hospital Stay: Payer: BC Managed Care – PPO | Attending: Hematology

## 2019-03-15 ENCOUNTER — Inpatient Hospital Stay: Payer: BC Managed Care – PPO

## 2019-03-15 ENCOUNTER — Inpatient Hospital Stay (HOSPITAL_BASED_OUTPATIENT_CLINIC_OR_DEPARTMENT_OTHER): Payer: BC Managed Care – PPO | Admitting: Hematology

## 2019-03-15 ENCOUNTER — Other Ambulatory Visit: Payer: Self-pay

## 2019-03-15 ENCOUNTER — Encounter: Payer: Self-pay | Admitting: Hematology

## 2019-03-15 ENCOUNTER — Telehealth: Payer: Self-pay | Admitting: Hematology

## 2019-03-15 ENCOUNTER — Encounter: Payer: Self-pay | Admitting: *Deleted

## 2019-03-15 VITALS — BP 113/68 | HR 120 | Temp 97.8°F | Resp 21

## 2019-03-15 DIAGNOSIS — C7801 Secondary malignant neoplasm of right lung: Secondary | ICD-10-CM | POA: Insufficient documentation

## 2019-03-15 DIAGNOSIS — C7951 Secondary malignant neoplasm of bone: Secondary | ICD-10-CM | POA: Insufficient documentation

## 2019-03-15 DIAGNOSIS — J90 Pleural effusion, not elsewhere classified: Secondary | ICD-10-CM | POA: Diagnosis not present

## 2019-03-15 DIAGNOSIS — J910 Malignant pleural effusion: Secondary | ICD-10-CM | POA: Insufficient documentation

## 2019-03-15 DIAGNOSIS — R590 Localized enlarged lymph nodes: Secondary | ICD-10-CM | POA: Insufficient documentation

## 2019-03-15 DIAGNOSIS — C7931 Secondary malignant neoplasm of brain: Secondary | ICD-10-CM | POA: Insufficient documentation

## 2019-03-15 DIAGNOSIS — D649 Anemia, unspecified: Secondary | ICD-10-CM

## 2019-03-15 DIAGNOSIS — Z171 Estrogen receptor negative status [ER-]: Secondary | ICD-10-CM | POA: Diagnosis not present

## 2019-03-15 DIAGNOSIS — G893 Neoplasm related pain (acute) (chronic): Secondary | ICD-10-CM | POA: Diagnosis not present

## 2019-03-15 DIAGNOSIS — D75839 Thrombocytosis, unspecified: Secondary | ICD-10-CM

## 2019-03-15 DIAGNOSIS — C7802 Secondary malignant neoplasm of left lung: Secondary | ICD-10-CM | POA: Insufficient documentation

## 2019-03-15 DIAGNOSIS — D6481 Anemia due to antineoplastic chemotherapy: Secondary | ICD-10-CM | POA: Diagnosis not present

## 2019-03-15 DIAGNOSIS — Z5112 Encounter for antineoplastic immunotherapy: Secondary | ICD-10-CM | POA: Insufficient documentation

## 2019-03-15 DIAGNOSIS — C50812 Malignant neoplasm of overlapping sites of left female breast: Secondary | ICD-10-CM | POA: Insufficient documentation

## 2019-03-15 DIAGNOSIS — T451X5A Adverse effect of antineoplastic and immunosuppressive drugs, initial encounter: Secondary | ICD-10-CM | POA: Insufficient documentation

## 2019-03-15 DIAGNOSIS — D72829 Elevated white blood cell count, unspecified: Secondary | ICD-10-CM | POA: Insufficient documentation

## 2019-03-15 DIAGNOSIS — Z79899 Other long term (current) drug therapy: Secondary | ICD-10-CM | POA: Diagnosis not present

## 2019-03-15 DIAGNOSIS — C50919 Malignant neoplasm of unspecified site of unspecified female breast: Secondary | ICD-10-CM

## 2019-03-15 DIAGNOSIS — D473 Essential (hemorrhagic) thrombocythemia: Secondary | ICD-10-CM

## 2019-03-15 DIAGNOSIS — F419 Anxiety disorder, unspecified: Secondary | ICD-10-CM

## 2019-03-15 LAB — CMP (CANCER CENTER ONLY)
ALT: 13 U/L (ref 0–44)
AST: 23 U/L (ref 15–41)
Albumin: 3 g/dL — ABNORMAL LOW (ref 3.5–5.0)
Alkaline Phosphatase: 77 U/L (ref 38–126)
Anion gap: 7 (ref 5–15)
BUN: 9 mg/dL (ref 6–20)
CO2: 31 mmol/L (ref 22–32)
Calcium: 9.1 mg/dL (ref 8.9–10.3)
Chloride: 100 mmol/L (ref 98–111)
Creatinine: 0.72 mg/dL (ref 0.44–1.00)
GFR, Est AFR Am: >60 mL/min (ref >60)
GFR, Estimated: >60 mL/min (ref >60)
Glucose, Bld: 90 mg/dL (ref 70–99)
Potassium: 4.4 mmol/L (ref 3.5–5.1)
Sodium: 138 mmol/L (ref 135–145)
Total Bilirubin: 0.3 mg/dL (ref 0.3–1.2)
Total Protein: 6.8 g/dL (ref 6.5–8.1)

## 2019-03-15 LAB — CBC WITH DIFFERENTIAL (CANCER CENTER ONLY)
Abs Immature Granulocytes: 0.17 10*3/uL — ABNORMAL HIGH (ref 0.00–0.07)
Basophils Absolute: 0 10*3/uL (ref 0.0–0.1)
Basophils Relative: 0 %
Eosinophils Absolute: 0 10*3/uL (ref 0.0–0.5)
Eosinophils Relative: 0 %
HCT: 34.1 % — ABNORMAL LOW (ref 36.0–46.0)
Hemoglobin: 10 g/dL — ABNORMAL LOW (ref 12.0–15.0)
Immature Granulocytes: 2 %
Lymphocytes Relative: 7 %
Lymphs Abs: 0.6 10*3/uL — ABNORMAL LOW (ref 0.7–4.0)
MCH: 25.8 pg — ABNORMAL LOW (ref 26.0–34.0)
MCHC: 29.3 g/dL — ABNORMAL LOW (ref 30.0–36.0)
MCV: 88.1 fL (ref 80.0–100.0)
Monocytes Absolute: 1 10*3/uL (ref 0.1–1.0)
Monocytes Relative: 11 %
Neutro Abs: 7.2 10*3/uL (ref 1.7–7.7)
Neutrophils Relative %: 80 %
Platelet Count: 493 10*3/uL — ABNORMAL HIGH (ref 150–400)
RBC: 3.87 MIL/uL (ref 3.87–5.11)
RDW: 18.6 % — ABNORMAL HIGH (ref 11.5–15.5)
WBC Count: 9.1 10*3/uL (ref 4.0–10.5)
nRBC: 0 % (ref 0.0–0.2)

## 2019-03-15 LAB — MAGNESIUM: Magnesium: 2 mg/dL (ref 1.7–2.4)

## 2019-03-15 MED ORDER — HEPARIN SOD (PORK) LOCK FLUSH 100 UNIT/ML IV SOLN
500.0000 [IU] | Freq: Once | INTRAVENOUS | Status: AC | PRN
  Administered 2019-03-15: 500 [IU]
  Filled 2019-03-15: qty 5

## 2019-03-15 MED ORDER — DIPHENHYDRAMINE HCL 25 MG PO CAPS
ORAL_CAPSULE | ORAL | Status: AC
  Filled 2019-03-15: qty 2

## 2019-03-15 MED ORDER — SODIUM CHLORIDE 0.9 % IV SOLN
Freq: Once | INTRAVENOUS | Status: AC
  Administered 2019-03-15: 14:00:00 via INTRAVENOUS
  Filled 2019-03-15: qty 250

## 2019-03-15 MED ORDER — MORPHINE SULFATE ER 30 MG PO TBCR: 30.0000 mg | EXTENDED_RELEASE_TABLET | Freq: Two times a day (BID) | ORAL | 0 refills | Status: DC

## 2019-03-15 MED ORDER — ACETAMINOPHEN 325 MG PO TABS
ORAL_TABLET | ORAL | Status: AC
  Filled 2019-03-15: qty 2

## 2019-03-15 MED ORDER — DIPHENHYDRAMINE HCL 25 MG PO CAPS
50.0000 mg | ORAL_CAPSULE | Freq: Once | ORAL | Status: AC
  Administered 2019-03-15: 13:00:00 50 mg via ORAL

## 2019-03-15 MED ORDER — CYCLOBENZAPRINE HCL 10 MG PO TABS: 10.0000 mg | ORAL_TABLET | Freq: Three times a day (TID) | ORAL | 0 refills | Status: AC | PRN

## 2019-03-15 MED ORDER — ACETAMINOPHEN 325 MG PO TABS
650.0000 mg | ORAL_TABLET | Freq: Once | ORAL | Status: AC
  Administered 2019-03-15: 650 mg via ORAL

## 2019-03-15 MED ORDER — GABAPENTIN 300 MG PO CAPS: 900.0000 mg | ORAL_CAPSULE | Freq: Two times a day (BID) | ORAL | 5 refills | Status: AC

## 2019-03-15 MED ORDER — SODIUM CHLORIDE 0.9% FLUSH
10.0000 mL | INTRAVENOUS | Status: DC | PRN
  Administered 2019-03-15: 10 mL
  Filled 2019-03-15: qty 10

## 2019-03-15 MED ORDER — LORAZEPAM 0.5 MG PO TABS: 0.5000 mg | ORAL_TABLET | Freq: Four times a day (QID) | ORAL | 0 refills | Status: AC | PRN

## 2019-03-15 MED ORDER — IPRATROPIUM BROMIDE HFA 17 MCG/ACT IN AERS: 2.0000 | INHALATION_SPRAY | RESPIRATORY_TRACT | 12 refills | Status: AC | PRN

## 2019-03-15 MED ORDER — SODIUM CHLORIDE 0.9 % IV SOLN
3.6000 mg/kg | Freq: Once | INTRAVENOUS | Status: AC
  Administered 2019-03-15: 360 mg via INTRAVENOUS
  Filled 2019-03-15: qty 16

## 2019-03-15 NOTE — Patient Instructions (Signed)
Tunneled Central Venous Catheter Flushing Guide  It is important to flush your tunneled central venous catheter each time you use it, both before and after you use it. Flushing your catheter will help prevent it from clogging. What are the risks? Risks may include:  Infection.  Air getting into the catheter and bloodstream. Supplies needed:  A clean pair of gloves.  A disinfecting wipe. Use an alcohol wipe, chlorhexidine wipe, or iodine wipe as told by your health care provider.  A 10 mL syringe that has been prefilled with saline solution.  An empty 10 mL syringe, if a substance called heparin was injected into your catheter. How to flush your catheter When you flush your catheter, make sure you follow any specific instructions from your health care provider or the manufacturer. These are general guidelines. Flushing your catheter before use If there is heparin in your catheter: 1. Wash your hands with soap and water. 2. Put on gloves. 3. Scrub the injection cap for a minimum of 15 seconds with a disinfecting wipe. 4. Unclamp the catheter. 5. Attach the empty syringe to the injection cap. 6. Pull the syringe plunger back and withdraw 10 mL of blood. 7. Place the syringe into an appropriate waste container. 8. Scrub the injection cap for 15 seconds with a disinfecting wipe. 9. Attach the prefilled syringe to the injection cap. 10. Flush the catheter by pushing the plunger forward until all the liquid from the syringe is in the catheter. 11. Remove the syringe from the injection cap. 12. Clamp the catheter. If there is no heparin in your catheter: 1. Wash your hands with soap and water. 2. Put on gloves. 3. Scrub the injection cap for 15 seconds with a disinfecting wipe. 4. Unclamp the catheter. 5. Attach the prefilled syringe to the injection cap. 6. Flush the catheter by pushing the plunger forward until 5 mL of the liquid from the syringe is in the catheter. 7. Pull back on  the syringe until you see blood in the catheter. 8. If you have been asked to collect any blood, follow your health care provider's instructions. Otherwise, flush the catheter with the rest of the solution from the syringe. 9. Remove the syringe from the injection cap. 10. Clamp the catheter.  Flushing your catheter after use 1. Wash your hands with soap and water. 2. Put on gloves. 3. Scrub the injection cap for 15 seconds with a disinfecting wipe. 4. Unclamp the catheter. 5. Attach the prefilled syringe to the injection cap. 6. Flush the catheter by pushing the plunger forward until all of the liquid from the syringe is in the catheter. 7. Remove the syringe from the injection cap. 8. Clamp the catheter. Problems and solutions  If blood cannot be completely cleared from the injection cap, you may need to have the injection cap replaced.  If the catheter is difficult to flush, use the pulsing method. The pulsing method involves pushing only a few milliliters of solution into the catheter at a time and pausing between pushes.  If you do not see blood in the catheter when you pull back on the syringe, change your body position, such as by raising your arms above your head. Take a deep breath and cough. Then, pull back on the syringe. If you still do not see blood, flush the catheter with a small amount of solution. Then, change positions again and take a breath or cough. Pull back on the syringe again. If you still do not see   blood, finish flushing the catheter and contact your health care provider. Do not use your catheter until your health care provider says it is okay. General tips  Have someone help you flush your catheter, if possible.  Do not force fluid through your catheter.  Do not use a syringe that is larger or smaller than 10 mL. Using a smaller syringe can make the catheter burst.  Do not use your catheter without flushing it first if it has heparin in it. Contact a health  care provider if:  You cannot see any blood in the catheter when you flush it before using it.  Your catheter is difficult to flush. Get help right away if:  You cannot flush the catheter.  The catheter leaks when you flush it or when there is fluid in it.  There are cracks or breaks in the catheter. Summary  It is important to flush your tunneled central venous catheter each time you use it, both before and after you use it.  Scrub the injection cap for 15 seconds with a disinfecting wipe before and after you flush it.  When you flush your catheter, make sure you follow any specific instructions from your health care provider or the manufacturer.  Get help right away if you cannot flush the catheter. This information is not intended to replace advice given to you by your health care provider. Make sure you discuss any questions you have with your health care provider. Document Released: 07/15/2011 Document Revised: 10/11/2018 Document Reviewed: 10/11/2018 Elsevier Patient Education  2020 Elsevier Inc.  

## 2019-03-15 NOTE — Telephone Encounter (Signed)
Appointments scheduled as requested per 8/6 los

## 2019-03-15 NOTE — Patient Instructions (Signed)
Ado-Trastuzumab Emtansine for injection What is this medicine? ADO-TRASTUZUMAB EMTANSINE (ADD oh traz TOO zuh mab em TAN zine) is a monoclonal antibody combined with chemotherapy. It is used to treat breast cancer. This medicine may be used for other purposes; ask your health care provider or pharmacist if you have questions. COMMON BRAND NAME(S): Kadcyla What should I tell my health care provider before I take this medicine? They need to know if you have any of these conditions:  heart disease  heart failure  infection (especially a virus infection such as chickenpox, cold sores, or herpes)  liver disease  lung or breathing disease, like asthma  tingling of the fingers or toes, or other nerve disorder  an unusual or allergic reaction to ado-trastuzumab emtansine, other medications, foods, dyes, or preservatives  pregnant or trying to get pregnant  breast-feeding How should I use this medicine? This medicine is for infusion into a vein. It is given by a health care professional in a hospital or clinic setting. Talk to your pediatrician regarding the use of this medicine in children. Special care may be needed. Overdosage: If you think you have taken too much of this medicine contact a poison control center or emergency room at once. NOTE: This medicine is only for you. Do not share this medicine with others. What if I miss a dose? It is important not to miss your dose. Call your doctor or health care professional if you are unable to keep an appointment. What may interact with this medicine? This medicine may also interact with the following medications:  atazanavir  boceprevir  clarithromycin  delavirdine  indinavir  dalfopristin; quinupristin  isoniazid, INH  itraconazole  ketoconazole  nefazodone  nelfinavir  ritonavir  telaprevir  telithromycin  tipranavir  voriconazole This list may not describe all possible interactions. Give your health care  provider a list of all the medicines, herbs, non-prescription drugs, or dietary supplements you use. Also tell them if you smoke, drink alcohol, or use illegal drugs. Some items may interact with your medicine. What should I watch for while using this medicine? Visit your doctor for checks on your progress. This drug may make you feel generally unwell. This is not uncommon, as chemotherapy can affect healthy cells as well as cancer cells. Report any side effects. Continue your course of treatment even though you feel ill unless your doctor tells you to stop. You may need blood work done while you are taking this medicine. Call your doctor or health care professional for advice if you get a fever, chills or sore throat, or other symptoms of a cold or flu. Do not treat yourself. This drug decreases your body's ability to fight infections. Try to avoid being around people who are sick. Be careful brushing and flossing your teeth or using a toothpick because you may get an infection or bleed more easily. If you have any dental work done, tell your dentist you are receiving this medicine. Avoid taking products that contain aspirin, acetaminophen, ibuprofen, naproxen, or ketoprofen unless instructed by your doctor. These medicines may hide a fever. Do not become pregnant while taking this medicine or for 7 months after stopping it, men with female partners should use contraception during treatment and for 4 months after the last dose. Women should inform their doctor if they wish to become pregnant or think they might be pregnant. There is a potential for serious side effects to an unborn child. Do not breast-feed an infant while taking this medicine or   for 7 months after the last dose. Men who have a partner who is pregnant or who is capable of becoming pregnant should use a condom during sexual activity while taking this medicine and for 4 months after stopping it. Men should inform their doctors if they wish to  father a child. This medicine may lower sperm counts. Talk to your health care professional or pharmacist for more information. What side effects may I notice from receiving this medicine? Side effects that you should report to your doctor or health care professional as soon as possible:  allergic reactions like skin rash, itching or hives, swelling of the face, lips, or tongue  breathing problems  chest pain or palpitations  fever or chills, sore throat  general ill feeling or flu-like symptoms  light-colored stools  nausea, vomiting  pain, tingling, numbness in the hands or feet  signs and symptoms of bleeding such as bloody or black, tarry stools; red or dark-brown urine; spitting up blood or brown material that looks like coffee grounds; red spots on the skin; unusual bruising or bleeding from the eye, gums, or nose  swelling of the legs or ankles  yellowing of the eyes or skin Side effects that usually do not require medical attention (report to your doctor or health care professional if they continue or are bothersome):  changes in taste  constipation  dizziness  headache  joint pain  muscle pain  trouble sleeping  unusually weak or tired This list may not describe all possible side effects. Call your doctor for medical advice about side effects. You may report side effects to FDA at 1-800-FDA-1088. Where should I keep my medicine? This drug is given in a hospital or clinic and will not be stored at home. NOTE: This sheet is a summary. It may not cover all possible information. If you have questions about this medicine, talk to your doctor, pharmacist, or health care provider.  2020 Elsevier/Gold Standard (2017-12-23 10:03:15)  

## 2019-03-15 NOTE — Progress Notes (Signed)
Patient here for f/u apt and treatment. She is very stressed with insurance and disability processes and is frustrated with the roadblocks. Our financial counselor is out of the office today.  I called the [place] to inquire about their assistance with the disability process. Anderson [place] stated that she had already been contacted by Johnnye Lana Clinical Social Worker at [facility] and had just made an appointment with the patient to discuss her disability application. The patient does not qualify for Medicaid, but she states she can call the [place] to make an appointment and they will walk through other insurance options which should be cheaper than the current cost of her [place].   Number for [place] [phone] - given to patient and she understands to call for appointment. Email also sent to Johnnye Lana letting her know that I had given patient information, but to also reach out to me if needed for any further patient care.

## 2019-03-15 NOTE — Progress Notes (Signed)
[place] OFFICE PROGRESS NOTE  Patient Care Team: Rakes, Connye Burkitt, FNP as PCP - General (Family Medicine) Tish Men, MD as Medical Oncologist (Hematology) Cordelia Poche, RN as Oncology Nurse Navigator  HEME/ONC OVERVIEW: 1. 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; ER/PR-, HER2+ (FISH) -Late 09/2018: evaluation for cough at [facility] ER; dermal thickening and multiple masses within the left breast and a 6cm mass invading the left pectoralis muscle on CT, consistent with primary breast malignancy, extensive thoracic adenopathy and numerous pulmonary metastases, and an indeterminate subcentimeter lucency in the dome of right liver -10/2018: admitted for malignant left pleural effusion (cytology pos for malignancy); MRI brain showed a single 18m R frontal met; CT AP negative; left supraclavicular LN bx showed metastatic carcinoma, ER/PR-, HER2+.   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 Port in 10/2018   3. Pleur-X for malignant right pleural effusion    TREATMENT REGIMEN:  10/23/2018 - 11/07/2018: palliative RT to the left breast x 2 weeks  11/08/2018: SBRT to the brain lesion, 20 Gy/1 fraction  11/09/2018 - 01/25/2019: 1st line Taxotere, Herceptin and Perjeta (Onpro on hold)  Taxotere dose reduced to 62mm2 due to anemia  02/15/2019 - present: 2nd line Kadcyla   ASSESSMENT & PLAN:   Stage IV (c([id number] left breast cancer with mets to the brain and lungs  -S/p 4 cycles of palliative THP and 1 cycle of Kadcyla -Clinically, patient is developing new left  supraclavicular lymphadenopathy, concerning for disease progression -However, patient has only received 1 cycle of Kadcyla so far, and it has not had sufficient time to take effect -Therefore, we will move forward with Cycle 2 of treatment today, and re-assess prior to Cycle 3 of treatment -If the patient develops further progression of disease on exam, then we would have to change to a regimen with chemotherapy backbone, such as carbo/gemcitabine + Herceptin -if her disease continues to progressive rapidly and her ECOG declines further, we will also need to re-engage in GOStoneiscussion  -[id number] monitoring while on Kadcyla, next due in early 02/2019 -PRN anti-emetics: Zofran, Compazine, Ativan and dexamethasone  Malignant bilateral pleural effusion  -S/p Pleurx placed for malignant left pleural effusion in late 10/2018, removed in 02/2019 -PleurX placed for malignant R pleural effusion in late 02/2019; left pleural effusion not amendable for intervention per IR and thoracic surgery -Continue drainage daily as needed -I have also prescribed PRN ipratropium inhaler for wheezing   Metastasis to the brain  -S/p SBRT to the brain met on 11/08/2018 -Repeat MRI brain scheduled on 03/30/2019 -Patient denies any new focal neurologic deficits  -We will monitor it for now   Chemotherapy-associated anemia -Secondary to chemotherapy -Hgb 10.0, stable  -Patient denies any symptom of bleeding -Patient has required periodic RBC transfusion; if anemia persists, we can consider adding Aranesp with chemotherapy  -We will monitor for now  Thrombocytosis -Likely reactive in the setting of underlying malignancy -Plts [id number], stable -We will monitor it for now  Breast wound -Overall improving, followed by wound care center  -Continue wound care as instructed  Cancer-related pain -Secondary to the breast malignancy and catheter  placement -On MS-Contin 65m BID w/ PRN oxycodone [id number] PRN for  breakthrough pain -Pain relatively well controlled -I have refilled her prescription today  Anxiety -I have prescribed Ativan PRN for anxiety -I counseled the patient on the risk of sedation and respiratory suppression, especially as she already has baseline dyspnea and is on several pain medications, and cautioned her to take them only as needed and to avoid taking multiple medications at the same time   No orders of the defined types were placed in this encounter.  All questions were answered. The patient knows to call the clinic with any problems, questions or concerns. No barriers to learning was detected.  Return in 2 weeks for labs, port flush and clinic appt.   YaTish MenMD 03/15/2019 12:39 PM  CHIEF COMPLAINT: "My breathing is a little better"  INTERVAL HISTORY: Ms. DaSilvestereturns clinic for follow-up of metastatic breast cancer.  She reports that since her Pleurx placement in the hospital, her breathing has improved, and she is draining her right Pleurx catheter daily with approximately [id number] removed each time.  She remains on Augmentin and has 3 more days remaining.  She currently takes MS-Contin twice a day as well as oxycodone as needed (approximately 4-5 times per day) with adequate pain control.  She has also been having some periodic anxiety, for which she takes Ativan with relief of the symptom.  She still has moderate shortness of breath, but is able to do some basic activities around the house.  She denies any other complaint today.  SUMMARY OF ONCOLOGIC HISTORY: Oncology History  [facility]ast[facility]   [facility].  Dermal thickening of the multiple masses within the left breast as well as a invasive 6 cm mass of the left pectoralis major, compatible with primary breast cancer. 2.  Numerous pulmonary metastases.  Left axillary, retropectoral, supraclavicular, and upper mediastinal  metastatic lymphadenopathy.  Mildly enlarged right axillary and supraclavicular lymph nodes may be metastatic. 3.  Indeterminant subcentimeter lucency in the dome of the right liver. 4.  Small left pleural effusion.   10/10/2018 Initial Diagnosis   [facility] ([facility]   CTA chest: IMPRESSION: 1. No evidence of lobar or more central pulmonary embolus. Nondiagnostic segmental assessment. 2. Moderate left pleural effusion, increased in size from the prior CT with increasing left lung atelectasis. 3. Unchanged appearance of multiple left breast masses including an invasive mass involving the pectoralis major. 4. Unchanged left axillary, left subpectoral, and mediastinal lymphadenopathy. 5. Mild interval enlargement of multiple lung metastases and of right axillary lymph nodes.   10/18/2018 Imaging   MRI brain w/o and w/ contrast: Single 4 mm focus of enhancement within right frontal white matter probably representing metastatic disease. Attention at follow-up is recommended.   10/19/2018 Imaging   CT abdomen/pelvis w/ contrast: IMPRESSION: 1. No CT evidence of metastatic disease involving the abdomen or pelvis. 2. Left pleural effusion associated atelectasis consolidation with numerous pulmonary masses and nodules on partially imaged. Skin thickening of the left breast. Findings are in keeping with advanced breast malignancy and as seen on recent CT of the chest, 10/17/2018.   10/19/2018 Procedure   US-guided bx of left supraclavicular LN    10/19/2018 Pathology Results   Accession: [id number] node, needle/core biopsy, left supraclavicular - METASTATIC CARCINOMA   11/09/2018 - 02/14/2019 Chemotherapy   The patient had pegfilgrastim-cbqv (UDENYCA) injection 6 mg, 6  mg, Subcutaneous, Once, 1 of 1 cycle Administration: 6 mg (11/10/2018) trastuzumab (HERCEPTIN) 900 mg in sodium chloride 0.9 % 250 mL chemo infusion, 966 mg, Intravenous,  Once, 4 of 7  cycles Administration: 900 mg (11/09/2018), 600 mg (12/14/2018), 600 mg (01/04/2019), 600 mg (01/25/2019) DOCEtaxel (TAXOTERE) 170 mg in sodium chloride 0.9 % 250 mL chemo infusion, 75 mg/m2 = 170 mg, Intravenous,  Once, 4 of 7 cycles Dose modification: 60 mg/m2 (original dose 75 mg/m2, Cycle 2, Reason: Dose not tolerated) Administration: 170 mg (11/09/2018), 140 mg (12/14/2018), 140 mg (01/04/2019), 140 mg (01/25/2019) pertuzumab (PERJETA) 840 mg in sodium chloride 0.9 % 250 mL chemo infusion, 840 mg, Intravenous, Once, 4 of 7 cycles Administration: 840 mg (11/09/2018), 420 mg (12/14/2018), 420 mg (01/04/2019), 420 mg (01/25/2019)  for chemotherapy treatment.    02/06/2019 Imaging   CT CAP: IMPRESSION: 1. There is mixed response to treatment, with decreased size of primary left breast mass and pulmonary nodules, however with enlarging lymphadenopathy and new, extensive sclerotic osseous metastatic disease.   2. Primary mass of the posterior left breast, involving the pectoralis major, has significantly decreased in size compared to prior examination, now measuring approximately 3.0 cm, previously at least 6.0 cm (series 2, image 14). There is unchanged, severe skin thickening of the left breast.   3. Numerous bilateral pulmonary masses and nodules have generally decreased in size, an index nodule of the right lower lobe measuring 2.0 cm, previously 3.1 cm on measured similarly (series 3, image 66). There has been interval increase in volume of bilateral pleural effusions, with extensive consolidation and volume loss of the left lung, with extensive nodular pleural disease. 4. However, bulky axillary lymph nodes have increased in size compared to prior examination, the largest in the right axilla 4.3 x 2.7 cm, previously 3.3 x 1.5 cm when measured similarly (series 2, image 11). Interval enlargement of supraclavicular and mediastinal lymph nodes, the largest supraclavicular nodes on the left  measuring 3.0 x 1.5 cm, previously 2.4 x 1.1 cm (series 2, image 5). Newly enlarged epicardial lymph nodes measuring up to 1.4 x 1.0 cm (series 2, image 37). There are newly enlarged left retroperitoneal lymph nodes measuring up to 1.4 x 1.0 cm (series 2, image 58).   5. There are innumerable new sclerotic osseous metastatic lesions involving the included axial and appendicular skeleton. In retrospect, some of these lesions were likely subtly present on prior CT dated 10/19/2018 and to some extent this change reflects post treatment appearance of metastatic lesions.   02/15/2019 -  Chemotherapy   The patient had ado-trastuzumab emtansine (KADCYLA) 320 mg in sodium chloride 0.9 % 250 mL chemo infusion, 360 mg, Intravenous, Once, 1 of 5 cycles Administration: 320 mg (02/15/2019)  for chemotherapy treatment.      REVIEW OF SYSTEMS:   Constitutional: ( - ) fevers, ( - )  chills , ( - ) night sweats Eyes: ( - ) blurriness of vision, ( - ) double vision, ( - ) watery eyes Ears, nose, mouth, throat, and face: ( - ) mucositis, ( - ) sore throat Respiratory: ( + ) cough, ( + ) dyspnea, ( - ) wheezes Cardiovascular: ( - ) palpitation, ( - ) chest discomfort, ( - ) lower extremity swelling Gastrointestinal:  ( - ) nausea, ( - ) heartburn, ( - ) change in bowel habits Skin: ( - ) abnormal skin rashes Lymphatics: ( + ) lymphadenopathy, ( - ) easy bruising Neurological: ( - ) numbness, ( - )  tingling, ( - ) new weaknesses Behavioral/Psych: ( - ) mood change, ( - ) new changes  All other systems were reviewed with the patient and are negative.  I have reviewed the past medical history, past surgical history, social history and family history with the patient and they are unchanged from previous note.  ALLERGIES:  is allergic to aspirin.  MEDICATIONS:  Current Outpatient Medications  Medication Sig Dispense Refill  . amoxicillin-clavulanate (AUGMENTIN) 875-125 MG tablet Take 1 tablet by mouth  every 12 (twelve) hours for 5 days. 10 tablet 0  . cyclobenzaprine (FLEXERIL) 10 MG tablet Take 1 tablet (10 mg total) by mouth 3 (three) times daily as needed for muscle spasms. 30 tablet 0  . gabapentin (NEURONTIN) 300 MG capsule Take 3 capsules (900 mg total) by mouth 2 (two) times daily. 180 capsule 5  . guaiFENesin (MUCINEX) 600 MG 12 hr tablet Take 600 mg by mouth 2 (two) times daily.    Marland Kitchen ipratropium (ATROVENT HFA) 17 MCG/ACT inhaler Inhale 2 puffs into the lungs every 4 (four) hours as needed for wheezing. 1 Inhaler 12  . lidocaine-prilocaine (EMLA) cream Apply to affected area once 30 g 3  . LORazepam (ATIVAN) 0.5 MG tablet Take 1 tablet (0.5 mg total) by mouth every 6 (six) hours as needed (Nausea or vomiting). 50 tablet 0  . metoprolol tartrate (LOPRESSOR) 25 MG tablet Take 0.5 tablets (12.5 mg total) by mouth 2 (two) times daily. 30 tablet 1  . morphine (MS CONTIN) 30 MG 12 hr tablet Take 1 tablet (30 mg total) by mouth every 12 (twelve) hours. 60 tablet 0  . ondansetron (ZOFRAN) 8 MG tablet Take 1 tablet (8 mg total) by mouth 2 (two) times daily as needed for refractory nausea / vomiting. 30 tablet 4  . oxyCODONE (OXY IR/ROXICODONE) 5 MG immediate release tablet Take 2 tablets (10 mg total) by mouth every 6 (six) hours as needed for severe pain. 240 tablet 0  . prochlorperazine (COMPAZINE) 10 MG tablet Take 1 tablet (10 mg total) by mouth every 6 (six) hours as needed (Nausea or vomiting). 30 tablet 4  . UNABLE TO FIND As per Medical necessity, Mastectomy Bra Q#2-Dx Code C50.812, Z17.1 Malignant neoplasm of overlapping sites of left breast in female, estrogen receptor negative 2 each 0   No current facility-administered medications for this visit.     PHYSICAL EXAMINATION: ECOG PERFORMANCE STATUS: 2 - Symptomatic, <50% confined to bed  Today's Vitals   03/15/19 1106 03/15/19 1138  BP: 113/68   Pulse: (!) 120   Resp: (!) 21   Temp: 97.8 F (36.6 C)   TempSrc: Oral   SpO2: 99%    PainSc: 2  0-No pain   There is no height or weight on file to calculate BMI.  There were no vitals filed for this visit.  GENERAL: no distress sitting in a wheelchair, slightly drowsy SKIN: skin color, texture, turgor are normal, no rashes or significant lesions EYES: conjunctiva are pink and non-injected, sclera clear OROPHARYNX: no exudate, no erythema; lips, buccal mucosa, and tongue normal  NECK: supple, non-tender LYMPH:  Bulky left supraclavicular adenopathy, measuring ~3x4cm, uncomfortable  LUNGS: faint wheezing in the left lung base with decreased air movement in bilateral lung bases  HEART: regular rhythm, mildly tachycardic, and no murmurs and no lower extremity edema ABDOMEN: soft, non-tender, non-distended, normal bowel sounds Musculoskeletal: no cyanosis of digits and no clubbing  PSYCH: slightly drowsy but conversive fluent speech  LABORATORY DATA:  I  have reviewed the data as listed    Component Value Date/Time   NA 138 03/15/2019 1115   K 4.4 03/15/2019 1115   CL 100 03/15/2019 1115   CO2 31 03/15/2019 1115   GLUCOSE 90 03/15/2019 1115   BUN 9 03/15/2019 1115   CREATININE 0.72 03/15/2019 1115   CALCIUM 9.1 03/15/2019 1115   PROT 6.8 03/15/2019 1115   ALBUMIN 3.0 (L) 03/15/2019 1115   AST 23 03/15/2019 1115   ALT 13 03/15/2019 1115   ALKPHOS 77 03/15/2019 1115   BILITOT 0.3 03/15/2019 1115   GFRNONAA >60 03/15/2019 1115   GFRAA >60 03/15/2019 1115    No results found for: SPEP, UPEP  Lab Results  Component Value Date   WBC 9.1 03/15/2019   NEUTROABS 7.2 03/15/2019   HGB 10.0 (L) 03/15/2019   HCT 34.1 (L) 03/15/2019   MCV 88.1 03/15/2019   PLT 493 (H) 03/15/2019      Chemistry      Component Value Date/Time   NA 138 03/15/2019 1115   K 4.4 03/15/2019 1115   CL 100 03/15/2019 1115   CO2 31 03/15/2019 1115   BUN 9 03/15/2019 1115   CREATININE 0.72 03/15/2019 1115      Component Value Date/Time   CALCIUM 9.1 03/15/2019 1115   ALKPHOS 77  03/15/2019 1115   AST 23 03/15/2019 1115   ALT 13 03/15/2019 1115   BILITOT 0.3 03/15/2019 1115       RADIOGRAPHIC STUDIES: I have personally reviewed the radiological images as listed below and agreed with the findings in the report. Dg Chest 1 View  Result Date: 02/19/2019 CLINICAL DATA:  Status post thoracentesis. History of breast carcinoma EXAM: CHEST  1 VIEW COMPARISON:  Jan 04, 2019 chest radiograph and chest CT angiogram December 07, 2018 FINDINGS: No pneumothorax. Moderate lower lobe consolidation and effusion present on the left. There are multiple mass lesions in the lungs consistent with metastatic foci. Heart size and pulmonary vascular normal. No adenopathy appreciable by radiography. Port-A-Cath tip is at the caval cervical junction. No bone lesions. IMPRESSION: No pneumothorax. Left lower lobe consolidation with left pleural effusion present. Multiple pulmonary metastases noted. The previously noted adenopathy seen on CT examination is not appreciable by radiography. Electronically Signed   By: Lowella Grip III M.D.   On: 02/19/2019 10:20   Ct Chest W Contrast  Result Date: 03/09/2019 CLINICAL DATA:  Acutely worsening dyspnea. Metastatic left-sided breast cancer. Malignant left pleural effusion. EXAM: CT CHEST WITH CONTRAST TECHNIQUE: Multidetector CT imaging of the chest was performed during intravenous contrast administration. CONTRAST:  74m OMNIPAQUE IOHEXOL 300 MG/ML  SOLN COMPARISON:  02/06/2019 FINDINGS: Cardiovascular: Right Port-A-Cath tip: Cavoatrial junction. Right pleural drainage catheter place. The previous left pleural drainage catheter is no longer appreciable. Please note that today's exam has relatively dilute contrast medium in the pulmonary arteries, and is not sensitive for pulmonary embolus. There is some narrowing of the pulmonary arteries, especially on the left side, likely from hypoaeration mediated vasoconstriction. Mediastinum/Nodes: Pathologic bilateral  supraclavicular, upper mediastinal, right internal mammary, pericardial, prevascular, right hilar, subcarinal, and right axillary adenopathy observed and significantly worsened from 12/07/2018. Left supraclavicular node 2.0 cm in short axis on image 15/2, previously 1.1 cm. One of many enlarged right axillary lymph nodes measures 3.2 cm in short axis on image 38/2, formerly 1.9 cm. Scattered new and enlarged prevascular and pericardial lymph nodes. New left paratracheal lymph nodes are present. Lungs/Pleura: Thick pleural rind on the left, potentially from  tumor. Consolidation in most of the left lower lobe and in the lingula and portions of the left upper lobe, increased from prior. There is little in the way of aerated lung on the left. Scattered pulmonary nodules are visible in the aerated portion of the right lung. For example, a pulmonary nodule in the right middle lobe measures 3.7 cm in thickness on image 65/5, formerly 2.5 cm. A mass posteriorly in the right lower lobe measures 3.2 by 2.4 cm, formerly 3.0 by 2.4 cm. Most of the scattered nodules are roughly similar to prior in the right lung. However, despite the presence of the right chest tube there is a moderate to large right pleural effusion. The pleural effusion was previously only trace in size. Upper Abdomen: Excreted contrast medium is present in the left collecting system. There is retrocrural adenopathy obscuring fat planes in the retrocrural space as well as a left periaortic lymph node on image 143/2 measuring 1.5 cm in diameter, compatible with upper abdominal malignancy. Musculoskeletal: Extensive subcutaneous edema in the left breast and left chest wall, and to a lesser extent in the right breast and right chest wall. Suspected tumor tracking in the left pectoralis major muscle. Nodularity suspicious for tumor tracking in the right upper breast medially. New and enlarging sclerotic metastatic lesions are scattered in the skeleton. IMPRESSION:  1. Extensive progression of malignancy with extensive adenopathy in the chest and lower neck 2. New and enlarging sclerotic metastatic lesions scattered in the skeleton. 3. Thick pleural rind on the left, likely from tumor, with consolidation of most of the left upper lobe, and of the lingula, and of the periphery of portions of the left upper lobe. 4. Stable to mildly increasing scattered pulmonary nodules and masses on the right. Enlarging right pleural effusion, now large, previously trace. 5. Worsening chest wall and breast edema, left greater than right. I do not see obvious venous occlusion and accordingly this is probably from apparent lymphatic drainage. 6. Probable tumor in the left pectoralis muscle and arborizing in the right medial breast. Upper abdominal retroperitoneal adenopathy compatible with metastatic spread. Electronically Signed   By: Van Clines M.D.   On: 03/09/2019 14:51   [place] Chest (pleural Effusion)  Result Date: 03/08/2019 CLINICAL DATA:  Breast carcinoma with right PleurX. Worsening opacities in the left hemithorax. Preop planning for possible left thoracentesis EXAM: CHEST ULTRASOUND COMPARISON:  Radiograph 03/06/2019 FINDINGS: No significant approachable left pleural effusion is identified. Thoracentesis deferred. IMPRESSION: No significant left effusion.  Thoracentesis deferred. Electronically Signed   By: Lucrezia Europe M.D.   On: 03/08/2019 16:22   Dg Chest Port 1 View  Result Date: 03/06/2019 CLINICAL DATA:  Abdominal distension with fluid retention and shortness of breath. Stage IV breast cancer. EXAM: PORTABLE CHEST 1 VIEW COMPARISON:  Radiographs 02/19/2019 and 01/04/2019.  CT 12/07/2018. FINDINGS: 1522 hours. Two views obtained. There are progressively lower lung volumes. The right IJ Port-A-Cath appears grossly unchanged, tip extending to the right atrial level. There is progressive opacification of left hemithorax due to a combination of left lower lobe  consolidation, a loculated pleural effusion and metastatic disease. Multiple nodules in the right lung are again noted. No pneumothorax. Telemetry leads overlie the chest. IMPRESSION: Overall worsening of the lungs with increasing pleuroparenchymal opacities in the left hemithorax, attributed to a combination of metastatic disease, loculated pleural fluid and possible posterior obstructive pneumonia. No significant changes seen in the right lung. Electronically Signed   By: Richardean Sale M.D.   On:  03/06/2019 16:44   Ir Removal Of Plural Cath W/cuff  Result Date: 02/16/2019 INDICATION: Recurrent left pleural effusion s/p Pleurx catheter placement 11/01/2018. Patient has been unable to drain her catheter for the past several weeks. Request is made for removal. EXAM: REMOVAL OF TUNNELED PLEURAL CATHETER MEDICATIONS: None ANESTHESIA/SEDATION: None FLUOROSCOPY TIME:  None COMPLICATIONS: None immediate. PROCEDURE: A sterile gown and gloves were worn during the procedure. Only a short length of catheter remained within the skin so that with removal of the overlying dressing and foam the catheter was then successfully removed in its entirety. A sterile dressing was applied over the catheter exit site. IMPRESSION: Removal of tunneled dialysis catheter utilizing sharp and blunt dissection. Read by: Brynda Greathouse PA-C Electronically Signed   By: Corrie Mckusick D.O.   On: 02/16/2019 13:52   Ir Thoracentesis Asp Pleural Space W/img Guide  Result Date: 02/19/2019 INDICATION: Patient with history of metastatic breast cancer, dyspnea, bilateral pleural effusions, larger on right. Request received for therapeutic right thoracentesis. EXAM: ULTRASOUND GUIDED THERAPEUTIC RIGHT THORACENTESIS MEDICATIONS: None COMPLICATIONS: None immediate. PROCEDURE: An ultrasound guided thoracentesis was thoroughly discussed with the patient and questions answered. The benefits, risks, alternatives and complications were also discussed. The  patient understands and wishes to proceed with the procedure. Written consent was obtained. Ultrasound was performed to localize and mark an adequate pocket of fluid in the right chest. The area was then prepped and draped in the normal sterile fashion. 1% Lidocaine was used for local anesthesia. Under ultrasound guidance a 6 Fr Safe-T-Centesis catheter was introduced. Thoracentesis was performed. The catheter was removed and a dressing applied. FINDINGS: A total of approximately 670 cc of yellow fluid was removed. IMPRESSION: Successful ultrasound guided therapeutic right thoracentesis yielding 670 cc of pleural fluid. Read by: Rowe Robert, PA-C No pneumothorax on follow-up chest radiograph. Electronically Signed   By: Lucrezia Europe M.D.   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 Currently, her right pleural effusion is causing shortness of breath. She most recently underwent thoracentesis on 02/19/2019 with removal of 670 mL. She presents today for placement of a tunneled pleural drainage catheter. EXAM: IR PERC PLEURAL DRAIN W/INDWELL CATH W/IMG GUIDE MEDICATIONS: 2 g Ancef ANESTHESIA/SEDATION: Fentanyl 100 mcg IV; Versed 1 mg IV Moderate Sedation Time:  16 minutes The patient was continuously monitored during the procedure by the interventional radiology nurse under my direct supervision. COMPLICATIONS: None immediate. PROCEDURE: Informed written consent was obtained from the patient after a thorough discussion of the procedural risks, benefits and alternatives. All questions were addressed. Maximal Sterile Barrier Technique was utilized including caps, mask, sterile gowns, sterile gloves, sterile drape, hand hygiene and skin antiseptic. A timeout was performed prior to the initiation of the procedure. The right chest was interrogated with  ultrasound. A large pleural effusion is identified. Suitable skin entry site was selected and marked. Local anesthesia was attained by infiltration with 1% lidocaine. Under sonographic guidance, an 18 gauge sheath needle was advanced into the fluid collection. The needle portion was removed. An Amplatz wire was advanced into the pleural space. A catheter exit site 5 cm anterior and slightly inferior to the pleural entry site was selected. Local anesthesia was again attained by infiltration with 1% lidocaine. A small dermatotomy was made. The PleurX catheter was tunneled from the skin exit site to the dermatotomy overlying pleural access site. A  peel-away sheath was then advanced over the wire and into the pleural space. The catheter was advanced through the peel-away sheath and the peel-away sheath was discarded. The catheter was connected to a vacuum tender bottle and 1250 mL was evacuated. An image was obtained and stored for the medical record. The dermatotomy overlying the pleural access site was closed with an inverted interrupted 4 0 Vicryl suture in the epidermis sealed with Dermabond. The catheter was secured to the skin at the exit site with an 0 Prolene suture. Sterile bandages were applied. IMPRESSION: Successful placement of a right-sided tunneled pleural drainage catheter. Initial aspiration yields 1250 mL pleural fluid. Electronically Signed   By: Jacqulynn Cadet M.D.   On: 03/07/2019 18:34

## 2019-03-16 DIAGNOSIS — C50812 Malignant neoplasm of overlapping sites of left female breast: Secondary | ICD-10-CM | POA: Diagnosis not present

## 2019-03-16 DIAGNOSIS — J9611 Chronic respiratory failure with hypoxia: Secondary | ICD-10-CM | POA: Diagnosis not present

## 2019-03-16 DIAGNOSIS — J910 Malignant pleural effusion: Secondary | ICD-10-CM | POA: Diagnosis not present

## 2019-03-16 DIAGNOSIS — T2101XD Burn of unspecified degree of chest wall, subsequent encounter: Secondary | ICD-10-CM | POA: Diagnosis not present

## 2019-03-16 DIAGNOSIS — C7931 Secondary malignant neoplasm of brain: Secondary | ICD-10-CM | POA: Diagnosis not present

## 2019-03-19 ENCOUNTER — Telehealth: Payer: Self-pay | Admitting: *Deleted

## 2019-03-19 NOTE — Telephone Encounter (Signed)
Verbal orders give to [facility] with [facility] [phone] for PT orders 1 x 1 week, then 2 times week for 7 weeks.

## 2019-03-22 ENCOUNTER — Encounter (HOSPITAL_BASED_OUTPATIENT_CLINIC_OR_DEPARTMENT_OTHER): Payer: BC Managed Care – PPO | Attending: Internal Medicine

## 2019-03-22 DIAGNOSIS — Z9221 Personal history of antineoplastic chemotherapy: Secondary | ICD-10-CM | POA: Diagnosis not present

## 2019-03-22 DIAGNOSIS — S21002A Unspecified open wound of left breast, initial encounter: Secondary | ICD-10-CM | POA: Diagnosis not present

## 2019-03-22 DIAGNOSIS — Y842 Radiological procedure and radiotherapy as the cause of abnormal reaction of the patient, or of later complication, without mention of misadventure at the time of the procedure: Secondary | ICD-10-CM | POA: Diagnosis not present

## 2019-03-22 DIAGNOSIS — C799 Secondary malignant neoplasm of unspecified site: Secondary | ICD-10-CM | POA: Insufficient documentation

## 2019-03-22 DIAGNOSIS — C50912 Malignant neoplasm of unspecified site of left female breast: Secondary | ICD-10-CM | POA: Diagnosis not present

## 2019-03-23 DIAGNOSIS — C7931 Secondary malignant neoplasm of brain: Secondary | ICD-10-CM | POA: Diagnosis not present

## 2019-03-23 DIAGNOSIS — T2101XD Burn of unspecified degree of chest wall, subsequent encounter: Secondary | ICD-10-CM | POA: Diagnosis not present

## 2019-03-23 DIAGNOSIS — J9611 Chronic respiratory failure with hypoxia: Secondary | ICD-10-CM | POA: Diagnosis not present

## 2019-03-23 DIAGNOSIS — J910 Malignant pleural effusion: Secondary | ICD-10-CM | POA: Diagnosis not present

## 2019-03-23 DIAGNOSIS — C50812 Malignant neoplasm of overlapping sites of left female breast: Secondary | ICD-10-CM | POA: Diagnosis not present

## 2019-03-26 DIAGNOSIS — C7931 Secondary malignant neoplasm of brain: Secondary | ICD-10-CM | POA: Diagnosis not present

## 2019-03-26 DIAGNOSIS — J9611 Chronic respiratory failure with hypoxia: Secondary | ICD-10-CM | POA: Diagnosis not present

## 2019-03-26 DIAGNOSIS — J910 Malignant pleural effusion: Secondary | ICD-10-CM | POA: Diagnosis not present

## 2019-03-26 DIAGNOSIS — T2101XD Burn of unspecified degree of chest wall, subsequent encounter: Secondary | ICD-10-CM | POA: Diagnosis not present

## 2019-03-26 DIAGNOSIS — C50812 Malignant neoplasm of overlapping sites of left female breast: Secondary | ICD-10-CM | POA: Diagnosis not present

## 2019-03-27 NOTE — Progress Notes (Signed)
[facility] OFFICE PROGRESS NOTE  Patient Care Team: Rakes, Connye Burkitt, FNP as PCP - General (Family Medicine) Tish Men, MD as Medical Oncologist (Hematology) Cordelia Poche, RN as Oncology Nurse Navigator  HEME/ONC OVERVIEW: 1. 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; ER/PR-, HER2+ (FISH) -Late 09/2018: evaluation for cough at [facility] ER; dermal thickening and multiple masses within the left breast and a 6cm mass invading the left pectoralis muscle on CT, consistent with primary breast malignancy, extensive thoracic adenopathy and numerous pulmonary metastases, and an indeterminate subcentimeter lucency in the dome of right liver -10/2018: admitted for malignant left pleural effusion (cytology pos for malignancy); MRI brain showed a single 20m R frontal met; CT AP negative; left supraclavicular LN bx showed metastatic carcinoma, ER/PR-, HER2+.   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 Port in 10/2018   3. Pleur-X for malignant right pleural effusion    TREATMENT REGIMEN:  10/23/2018 - 11/07/2018: palliative RT to the left breast x 2 weeks  11/08/2018: SBRT to the brain lesion, 20 Gy/1 fraction  11/09/2018 - 01/25/2019: 1st line Taxotere, Herceptin and Perjeta (Onpro on hold)  Taxotere dose reduced to 628mm2 due to anemia  02/15/2019 - present: 2nd line Kadcyla   ASSESSMENT & PLAN:   Stage IV (c([id number] left breast cancer with mets to the brain and lungs  -S/p 4 cycles of palliative THP (1st line) and 2 cycles of Kadcyla (2nd line) -Unfortunately, she has bulky  left supraclavicular lymphadenopathy as well as additional cervical adenopathy, concerning for disease progression -I have ordered CT CAP to assess interim disease status -I discussed with the patient that if CT confirms significant disease progression, I would recommend changing treatment to a regimen with chemotherapy backbone -We reviewed the NCCN guideline in detail -We discussed some of the risks, benefits and side-effects of carboplatin, paclitaxel and Herceptin. -Some of the short term side-effects included, though not limited to, risk of fatigue, weight loss, tumor lysis syndrome, risk of allergic reactions, pancytopenia, life-threatening infections, need for transfusions of blood products, nausea, vomiting, change in bowel habits, admission to hospital for various reasons, and risks of death.  -Long term side-effects are also discussed including permanent damage to nerve function, chronic fatigue, and rare secondary malignancy including bone marrow disorders.  -The patient is aware that the response rates discussed earlier is not guaranteed.   -After a long discussion, patient made an informed decision to proceed with the prescribed plan of care -We will tentatively schedule her treatment on 04/10/2019, pending CT results -[id number] monitoring while on Herceptin, next due in mid-05/2019 -PRN anti-emetics: Zofran, Compazine, Ativan and dexamethasone  Malignant bilateral pleural effusion  -S/p Pleurx placed for malignant left pleural effusion in late 10/2018, removed in 02/2019 -PleurX placed for malignant R pleural effusion in late 02/2019; left pleural effusion not amendable for intervention per IR and thoracic surgery -Continue drainage daily as needed and PRN Atrovent inhaler   Metastasis to the brain  -S/p SBRT to the brain met on 11/08/2018 -Repeat MRI brain scheduled on 03/30/2019 -Patient denies any new focal neurologic deficits  -We will monitor it for now  Chemotherapy-associated anemia -Secondary to chemotherapy -Hgb 10.5, stable  -Patient denies any symptom of bleeding -We will monitor for now  Breast wound -Overall improving, followed by wound care center  -Continue wound care as instructed  Cancer-related pain -Secondary to the breast malignancy and catheter placement -Currently MS-Contin 84m BID w/ PRN oxycodone 140mq6hrs PRN for breakthrough pain -She has had progressive generalized bone pain and has been taking oxycodone ~q4hrs with some breakthrough pain -Given the worsening bone pain, I have changed MS-Contin to fentanyl patch 5060mhr and continue oxycodone for breakthrough pain   Goals of care discussion -I had a very lengthy discussion with the patient regarding the rapidly progressive nature of her inflammatory breast cancer despite aggressive treatment -Furthermore, her performance status is borderline and her quality of life has been slowly deteriorating, and I expressed my concern about overall poor prognosis -Patient was very tearful during the discussion, but insisted on "doing everything I can" for her 44 year old son -I reassured the patient that we would treat her cancer to our best ability, but I would also like to be mindful of the impact on her quality of life from the side effects of chemotherapy and frequent travels for her treatment -Patient expressed understanding, but would like to continue treatment as tolerated -We will continue to engage the patient in goals of care discussion  Orders Placed This Encounter  Procedures  . CT SOFT TISSUE NECK W CONTRAST    Standing Status:   Future    Standing Expiration Date:   03/28/2020    Order Specific Question:   ** REASON FOR EXAM (FREE TEXT)    Answer:   Metastatic breast cancer on 2nd line Kadcyla, assess disease response    Order Specific Question:   If indicated for the ordered procedure, I authorize the administration of contrast media per Radiology protocol     Answer:   Yes    Order Specific Question:   Preferred imaging location?    Answer[facility]   [facility] Question:   Radiology Contrast Protocol - do NOT remove file path    Answer:   \\[email or web address]    Order Specific Question:   Is patient pregnant?    Answer:   No  . CT CHEST W CONTRAST    Standing Status:   Future    Standing Expiration Date:   03/28/2020    Order Specific Question:   ** REASON FOR EXAM (FREE TEXT)    Answer:   Metastatic breast cancer on 2nd line Kadcyla, assess disease response    Order Specific Question:   If indicated for the ordered procedure, I authorize the administration of contrast media per Radiology protocol    Answer:   Yes    Order Specific Question:   Preferred imaging location?    Answer:   [facility] Question:   Radiology Contrast Protocol - do NOT remove file path    Answer:   \\[email or web address]    Order Specific Question:   Is patient pregnant?    Answer:   No  . CT ABDOMEN PELVIS W CONTRAST    Standing Status:   Future    Standing Expiration Date:   03/28/2020    Order Specific Question:   ** REASON FOR EXAM (FREE TEXT)    Answer:   Metastatic breast cancer on 2nd line Kadcyla, assess disease response    Order Specific Question:   If indicated for the ordered  procedure, I authorize the administration of contrast media per Radiology protocol    Answer:   Yes    Order Specific Question:   Preferred imaging location?    [facility] Question:   Is Oral Contrast requested for this exam?    Answer:   Yes, Per Radiology protocol    Order Specific Question:   Radiology Contrast Protocol - do NOT remove file path    Answer:   \\[email or web address]    Order Specific Question:   Is patient pregnant?    Answer:   No    All questions were answered. The patient knows to call the clinic with any  problems, questions or concerns. No barriers to learning was detected.  A total of more than 60 minutes were spent face-to-face with the patient during this encounter and over half of that time was spent on counseling and coordination of care as outlined above.  Tentatively return on 04/10/2019 for labs, port flush, clinic appt and chemotherapy.   Tish Men, MD 03/29/2019 1:18 PM  CHIEF COMPLAINT: "I am still short of breath* "  INTERVAL HISTORY: Ms. Streng returns clinic for follow-up of metastatic breast cancer on Kadcyla.  Patient reports that she has been draining her Pleurx catheter daily with approximately 500 cc of fluid which time.  The fluid is clear.  She has persistent exertional dyspnea after walking 25 feet, and has to rest about 10 minutes for her breathing to improve.  She has been working with physical therapy at home to try to improve her functional status.  She has periodic coughing with clear sputum production and she takes Mucinex daily, but she denies any fever or change in sputum color.  Her generalized bone pain has gotten progressively worse over the past several weeks, and she has been taking oxycodone approximately every 4 hours in addition to MS-Contin, but she still has breakthrough bone pain.  We had a very lengthy discussion about the rapid disease progression despite aggressive chemotherapy and my concern about overall poor prognosis.  Patient became very tearful and stated that she was doing everything she could for her 50 year old son.  She recognizes that the cancer and chemotherapy are taking a toll on her quality of life, but she is not ready to "give up" for her son's sake.  SUMMARY OF ONCOLOGIC HISTORY: Oncology History  Metastatic breast cancer ([place])  09/30/2018 Imaging   CT chest with contrast (at [facility]): Impressions: 1.  Dermal thickening of the multiple masses within the left breast as well as a invasive 6 cm mass of the left pectoralis major,  compatible with primary breast cancer. 2.  Numerous pulmonary metastases.  Left axillary, retropectoral, supraclavicular, and upper mediastinal metastatic lymphadenopathy.  Mildly enlarged right axillary and supraclavicular lymph nodes may be metastatic. 3.  Indeterminant subcentimeter lucency in the dome of the right liver. 4.  Small left pleural effusion.   10/10/2018 Initial Diagnosis   Breast cancer ([place])   10/17/2018 Imaging   CTA chest: IMPRESSION: 1. No evidence of lobar or more central pulmonary embolus. Nondiagnostic segmental assessment. 2. Moderate left pleural effusion, increased in size from the prior CT with increasing left lung atelectasis. 3. Unchanged appearance of multiple left breast masses including an invasive mass involving the pectoralis major. 4. Unchanged left axillary, left subpectoral, and mediastinal lymphadenopathy. 5. Mild interval enlargement of multiple lung metastases and of right axillary lymph nodes.   10/18/2018 Imaging   MRI  brain w/o and w/ contrast: Single 4 mm focus of enhancement within right frontal white matter probably representing metastatic disease. Attention at follow-up is recommended.   10/19/2018 Imaging   CT abdomen/pelvis w/ contrast: IMPRESSION: 1. No CT evidence of metastatic disease involving the abdomen or pelvis. 2. Left pleural effusion associated atelectasis consolidation with numerous pulmonary masses and nodules on partially imaged. Skin thickening of the left breast. Findings are in keeping with advanced breast malignancy and as seen on recent CT of the chest, 10/17/2018.   10/19/2018 Procedure   US-guided bx of left supraclavicular LN    10/19/2018 Pathology Results   Accession: [id number]  Lymph node, needle/core biopsy, left supraclavicular - METASTATIC CARCINOMA   11/09/2018 - 02/14/2019 Chemotherapy   The patient had pegfilgrastim-cbqv (UDENYCA) injection 6 mg, 6 mg, Subcutaneous, Once, 1 of 1  cycle Administration: 6 mg (11/10/2018) trastuzumab (HERCEPTIN) 900 mg in sodium chloride 0.9 % 250 mL chemo infusion, 966 mg, Intravenous,  Once, 4 of 7 cycles Administration: 900 mg (11/09/2018), 600 mg (12/14/2018), 600 mg (01/04/2019), 600 mg (01/25/2019) DOCEtaxel (TAXOTERE) 170 mg in sodium chloride 0.9 % 250 mL chemo infusion, 75 mg/m2 = 170 mg, Intravenous,  Once, 4 of 7 cycles Dose modification: 60 mg/m2 (original dose 75 mg/m2, Cycle 2, Reason: Dose not tolerated) Administration: 170 mg (11/09/2018), 140 mg (12/14/2018), 140 mg (01/04/2019), 140 mg (01/25/2019) pertuzumab (PERJETA) 840 mg in sodium chloride 0.9 % 250 mL chemo infusion, 840 mg, Intravenous, Once, 4 of 7 cycles Administration: 840 mg (11/09/2018), 420 mg (12/14/2018), 420 mg (01/04/2019), 420 mg (01/25/2019)  for chemotherapy treatment.    02/06/2019 Imaging   CT CAP: IMPRESSION: 1. There is mixed response to treatment, with decreased size of primary left breast mass and pulmonary nodules, however with enlarging lymphadenopathy and new, extensive sclerotic osseous metastatic disease.   2. Primary mass of the posterior left breast, involving the pectoralis major, has significantly decreased in size compared to prior examination, now measuring approximately 3.0 cm, previously at least 6.0 cm (series 2, image 14). There is unchanged, severe skin thickening of the left breast.   3. Numerous bilateral pulmonary masses and nodules have generally decreased in size, an index nodule of the right lower lobe measuring 2.0 cm, previously 3.1 cm on measured similarly (series 3, image 66). There has been interval increase in volume of bilateral pleural effusions, with extensive consolidation and volume loss of the left lung, with extensive nodular pleural disease. 4. However, bulky axillary lymph nodes have increased in size compared to prior examination, the largest in the right axilla 4.3 x 2.7 cm, previously 3.3 x 1.5 cm when measured  similarly (series 2, image 11). Interval enlargement of supraclavicular and mediastinal lymph nodes, the largest supraclavicular nodes on the left measuring 3.0 x 1.5 cm, previously 2.4 x 1.1 cm (series 2, image 5). Newly enlarged epicardial lymph nodes measuring up to 1.4 x 1.0 cm (series 2, image 37). There are newly enlarged left retroperitoneal lymph nodes measuring up to 1.4 x 1.0 cm (series 2, image 58).   5. There are innumerable new sclerotic osseous metastatic lesions involving the included axial and appendicular skeleton. In retrospect, some of these lesions were likely subtly present on prior CT dated 10/19/2018 and to some extent this change reflects post treatment appearance of metastatic lesions.   02/15/2019 -  Chemotherapy   The patient had ado-trastuzumab emtansine (KADCYLA) 320 mg in sodium chloride 0.9 % 250 mL chemo infusion, 360 mg, Intravenous, Once, 2 of 5  cycles Administration: 320 mg (02/15/2019), 360 mg (03/15/2019)  for chemotherapy treatment.      REVIEW OF SYSTEMS:   Constitutional: ( - ) fevers, ( - )  chills , ( - ) night sweats Eyes: ( - ) blurriness of vision, ( - ) double vision, ( - ) watery eyes Ears, nose, mouth, throat, and face: ( - ) mucositis, ( - ) sore throat Respiratory: ( + ) cough, ( + ) dyspnea, ( - ) wheezes Cardiovascular: ( - ) palpitation, ( - ) chest discomfort, ( - ) lower extremity swelling Gastrointestinal:  ( - ) nausea, ( - ) heartburn, ( - ) change in bowel habits Skin: ( - ) abnormal skin rashes Lymphatics: ( + ) new lymphadenopathy, ( - ) easy bruising Neurological: ( - ) numbness, ( - ) tingling, ( + ) generalized weaknesses Behavioral/Psych: ( - ) mood change, ( - ) new changes  All other systems were reviewed with the patient and are negative.  I have reviewed the past medical history, past surgical history, social history and family history with the patient and they are unchanged from previous note.  ALLERGIES:  is allergic  to aspirin.  MEDICATIONS:  Current Outpatient Medications  Medication Sig Dispense Refill  . cyclobenzaprine (FLEXERIL) 10 MG tablet Take 1 tablet (10 mg total) by mouth 3 (three) times daily as needed for muscle spasms. 30 tablet 0  . gabapentin (NEURONTIN) 300 MG capsule Take 3 capsules (900 mg total) by mouth 2 (two) times daily. 180 capsule 5  . guaiFENesin (MUCINEX) 600 MG 12 hr tablet Take 600 mg by mouth 2 (two) times daily.    Marland Kitchen ipratropium (ATROVENT HFA) 17 MCG/ACT inhaler Inhale 2 puffs into the lungs every 4 (four) hours as needed for wheezing. 1 Inhaler 12  . lidocaine-prilocaine (EMLA) cream Apply to affected area once 30 g 3  . LORazepam (ATIVAN) 0.5 MG tablet Take 1 tablet (0.5 mg total) by mouth every 6 (six) hours as needed (Nausea or vomiting). 50 tablet 0  . metoprolol tartrate (LOPRESSOR) 25 MG tablet Take 0.5 tablets (12.5 mg total) by mouth 2 (two) times daily. 30 tablet 1  . ondansetron (ZOFRAN) 8 MG tablet Take 1 tablet (8 mg total) by mouth 2 (two) times daily as needed for refractory nausea / vomiting. 30 tablet 4  . oxyCODONE (OXY IR/ROXICODONE) 5 MG immediate release tablet Take 2 tablets (10 mg total) by mouth every 6 (six) hours as needed for severe pain. 240 tablet 0  . prochlorperazine (COMPAZINE) 10 MG tablet Take 1 tablet (10 mg total) by mouth every 6 (six) hours as needed (Nausea or vomiting). 30 tablet 4  . UNABLE TO FIND As per Medical necessity, Mastectomy Bra Q#2-Dx Code C50.812, Z17.1 Malignant neoplasm of overlapping sites of left breast in female, estrogen receptor negative 2 each 0  . fentaNYL (DURAGESIC) 50 MCG/HR Place 1 patch onto the skin every 3 (three) days. 10 patch 0   No current facility-administered medications for this visit.    Facility-Administered Medications Ordered in Other Visits  Medication Dose Route Frequency Provider Last Rate Last Dose  . sodium chloride flush (NS) 0.9 % injection 10 mL  10 mL Intravenous PRN Tish Men, MD   10  mL at 03/29/19 1259    PHYSICAL EXAMINATION: ECOG PERFORMANCE STATUS: 2 - Symptomatic, <50% confined to bed  Today's Vitals   03/29/19 1141  BP: 105/73  Pulse: (!) 108  Resp: 18  Temp: (!) 97.3  F (36.3 C)  TempSrc: Temporal  SpO2: 100%  Weight: 222 lb (100.7 kg)  Height: 5' 4"  (1.626 m)  PainSc: 0-No pain   Body mass index is 38.11 kg/m.  Filed Weights   03/29/19 1141  Weight: 222 lb (100.7 kg)    GENERAL: alert, no distress, tearful  SKIN: skin color, texture, turgor are normal, no rashes or significant lesions EYES: conjunctiva are pink and non-injected, sclera clear OROPHARYNX: no exudate, no erythema; lips, buccal mucosa, and tongue normal  NECK: supple, non-tender LYMPH: bulky left supraclavicular lymphadenopathy with scattered additional cervical adenopathy LUNGS: decreased air movement in the left lung base with some faint wheezing, no coarse rhonchi HEART: regular rate & rhythm and no murmurs and 1+ bilateral lower extremity edema ABDOMEN: soft, non-tender, non-distended, normal bowel sounds Musculoskeletal: no cyanosis of digits and no clubbing  PSYCH: alert & oriented x 3, speaking in short sentences due to shortness of breath  LABORATORY DATA:  I have reviewed the data as listed    Component Value Date/Time   NA 136 03/29/2019 1140   K 4.7 03/29/2019 1140   CL 97 (L) 03/29/2019 1140   CO2 34 (H) 03/29/2019 1140   GLUCOSE 95 03/29/2019 1140   BUN 12 03/29/2019 1140   CREATININE 0.91 03/29/2019 1140   CALCIUM 8.7 (L) 03/29/2019 1140   PROT 6.2 (L) 03/29/2019 1140   ALBUMIN 2.7 (L) 03/29/2019 1140   AST 38 03/29/2019 1140   ALT 21 03/29/2019 1140   ALKPHOS 96 03/29/2019 1140   BILITOT 0.4 03/29/2019 1140   GFRNONAA >60 03/29/2019 1140   GFRAA >60 03/29/2019 1140    No results found for: SPEP, UPEP  Lab Results  Component Value Date   WBC 7.7 03/29/2019   NEUTROABS 6.1 03/29/2019   HGB 10.5 (L) 03/29/2019   HCT 35.6 (L) 03/29/2019   MCV 88.3  03/29/2019   PLT 384 03/29/2019      Chemistry      Component Value Date/Time   NA 136 03/29/2019 1140   K 4.7 03/29/2019 1140   CL 97 (L) 03/29/2019 1140   CO2 34 (H) 03/29/2019 1140   BUN 12 03/29/2019 1140   CREATININE 0.91 03/29/2019 1140      Component Value Date/Time   CALCIUM 8.7 (L) 03/29/2019 1140   ALKPHOS 96 03/29/2019 1140   AST 38 03/29/2019 1140   ALT 21 03/29/2019 1140   BILITOT 0.4 03/29/2019 1140       RADIOGRAPHIC STUDIES: I have personally reviewed the radiological images as listed below and agreed with the findings in the report. Ct Chest W Contrast  Result Date: 03/09/2019 CLINICAL DATA:  Acutely worsening dyspnea. Metastatic left-sided breast cancer. Malignant left pleural effusion. EXAM: CT CHEST WITH CONTRAST TECHNIQUE: Multidetector CT imaging of the chest was performed during intravenous contrast administration. CONTRAST:  17m OMNIPAQUE IOHEXOL 300 MG/ML  SOLN COMPARISON:  02/06/2019 FINDINGS: Cardiovascular: Right Port-A-Cath tip: Cavoatrial junction. Right pleural drainage catheter place. The previous left pleural drainage catheter is no longer appreciable. Please note that today's exam has relatively dilute contrast medium in the pulmonary arteries, and is not sensitive for pulmonary embolus. There is some narrowing of the pulmonary arteries, especially on the left side, likely from hypoaeration mediated vasoconstriction. Mediastinum/Nodes: Pathologic bilateral supraclavicular, upper mediastinal, right internal mammary, pericardial, prevascular, right hilar, subcarinal, and right axillary adenopathy observed and significantly worsened from 12/07/2018. Left supraclavicular node 2.0 cm in short axis on image 15/2, previously 1.1 cm. One of many enlarged  right axillary lymph nodes measures 3.2 cm in short axis on image 38/2, formerly 1.9 cm. Scattered new and enlarged prevascular and pericardial lymph nodes. New left paratracheal lymph nodes are present.  Lungs/Pleura: Thick pleural rind on the left, potentially from tumor. Consolidation in most of the left lower lobe and in the lingula and portions of the left upper lobe, increased from prior. There is little in the way of aerated lung on the left. Scattered pulmonary nodules are visible in the aerated portion of the right lung. For example, a pulmonary nodule in the right middle lobe measures 3.7 cm in thickness on image 65/5, formerly 2.5 cm. A mass posteriorly in the right lower lobe measures 3.2 by 2.4 cm, formerly 3.0 by 2.4 cm. Most of the scattered nodules are roughly similar to prior in the right lung. However, despite the presence of the right chest tube there is a moderate to large right pleural effusion. The pleural effusion was previously only trace in size. Upper Abdomen: Excreted contrast medium is present in the left collecting system. There is retrocrural adenopathy obscuring fat planes in the retrocrural space as well as a left periaortic lymph node on image 143/2 measuring 1.5 cm in diameter, compatible with upper abdominal malignancy. Musculoskeletal: Extensive subcutaneous edema in the left breast and left chest wall, and to a lesser extent in the right breast and right chest wall. Suspected tumor tracking in the left pectoralis major muscle. Nodularity suspicious for tumor tracking in the right upper breast medially. New and enlarging sclerotic metastatic lesions are scattered in the skeleton. IMPRESSION: 1. Extensive progression of malignancy with extensive adenopathy in the chest and lower neck 2. New and enlarging sclerotic metastatic lesions scattered in the skeleton. 3. Thick pleural rind on the left, likely from tumor, with consolidation of most of the left upper lobe, and of the lingula, and of the periphery of portions of the left upper lobe. 4. Stable to mildly increasing scattered pulmonary nodules and masses on the right. Enlarging right pleural effusion, now large, previously trace.  5. Worsening chest wall and breast edema, left greater than right. I do not see obvious venous occlusion and accordingly this is probably from apparent lymphatic drainage. 6. Probable tumor in the left pectoralis muscle and arborizing in the right medial breast. Upper abdominal retroperitoneal adenopathy compatible with metastatic spread. Electronically Signed   By: Van Clines M.D.   On: 03/09/2019 14:51   [place] Chest (pleural Effusion)  Result Date: 03/08/2019 CLINICAL DATA:  Breast carcinoma with right PleurX. Worsening opacities in the left hemithorax. Preop planning for possible left thoracentesis EXAM: CHEST ULTRASOUND COMPARISON:  Radiograph 03/06/2019 FINDINGS: No significant approachable left pleural effusion is identified. Thoracentesis deferred. IMPRESSION: No significant left effusion.  Thoracentesis deferred. Electronically Signed   By: Lucrezia Europe M.D.   On: 03/08/2019 16:22   Dg Chest Port 1 View  Result Date: 03/06/2019 CLINICAL DATA:  Abdominal distension with fluid retention and shortness of breath. Stage IV breast cancer. EXAM: PORTABLE CHEST 1 VIEW COMPARISON:  Radiographs 02/19/2019 and 01/04/2019.  CT 12/07/2018. FINDINGS: 1522 hours. Two views obtained. There are progressively lower lung volumes. The right IJ Port-A-Cath appears grossly unchanged, tip extending to the right atrial level. There is progressive opacification of left hemithorax due to a combination of left lower lobe consolidation, a loculated pleural effusion and metastatic disease. Multiple nodules in the right lung are again noted. No pneumothorax. Telemetry leads overlie the chest. IMPRESSION: Overall worsening of the lungs with increasing  pleuroparenchymal opacities in the left hemithorax, attributed to a combination of metastatic disease, loculated pleural fluid and possible posterior obstructive pneumonia. No significant changes seen in the right lung. Electronically Signed   By: Richardean Sale M.D.   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 Currently, her right pleural effusion is causing shortness of breath. She most recently underwent thoracentesis on 02/19/2019 with removal of 670 mL. She presents today for placement of a tunneled pleural drainage catheter. EXAM: IR PERC PLEURAL DRAIN W/INDWELL CATH W/IMG GUIDE MEDICATIONS: 2 g Ancef ANESTHESIA/SEDATION: Fentanyl 100 mcg IV; Versed 1 mg IV Moderate Sedation Time:  16 minutes The patient was continuously monitored during the procedure by the interventional radiology nurse under my direct supervision. COMPLICATIONS: None immediate. PROCEDURE: Informed written consent was obtained from the patient after a thorough discussion of the procedural risks, benefits and alternatives. All questions were addressed. Maximal Sterile Barrier Technique was utilized including caps, mask, sterile gowns, sterile gloves, sterile drape, hand hygiene and skin antiseptic. A timeout was performed prior to the initiation of the procedure. The right chest was interrogated with ultrasound. A large pleural effusion is identified. Suitable skin entry site was selected and marked. Local anesthesia was attained by infiltration with 1% lidocaine. Under sonographic guidance, an 18 gauge sheath needle was advanced into the fluid collection. The needle portion was removed. An Amplatz wire was advanced into the pleural space. A catheter exit site 5 cm anterior and slightly inferior to the pleural entry site was selected. Local anesthesia was again attained by infiltration with 1% lidocaine. A small dermatotomy was made. The PleurX catheter was tunneled from the skin exit site to the dermatotomy overlying pleural access site. A peel-away sheath was then advanced over the wire and into the pleural space. The catheter was advanced  through the peel-away sheath and the peel-away sheath was discarded. The catheter was connected to a vacuum tender bottle and 1250 mL was evacuated. An image was obtained and stored for the medical record. The dermatotomy overlying the pleural access site was closed with an inverted interrupted 4 0 Vicryl suture in the epidermis sealed with Dermabond. The catheter was secured to the skin at the exit site with an 0 Prolene suture. Sterile bandages were applied. IMPRESSION: Successful placement of a right-sided tunneled pleural drainage catheter. Initial aspiration yields 1250 mL pleural fluid. Electronically Signed   By: Jacqulynn Cadet M.D.   On: 03/07/2019 18:34

## 2019-03-28 DIAGNOSIS — C7931 Secondary malignant neoplasm of brain: Secondary | ICD-10-CM | POA: Diagnosis not present

## 2019-03-28 DIAGNOSIS — J910 Malignant pleural effusion: Secondary | ICD-10-CM | POA: Diagnosis not present

## 2019-03-28 DIAGNOSIS — T2101XD Burn of unspecified degree of chest wall, subsequent encounter: Secondary | ICD-10-CM | POA: Diagnosis not present

## 2019-03-28 DIAGNOSIS — J9601 Acute respiratory failure with hypoxia: Secondary | ICD-10-CM | POA: Diagnosis not present

## 2019-03-28 DIAGNOSIS — C50812 Malignant neoplasm of overlapping sites of left female breast: Secondary | ICD-10-CM | POA: Diagnosis not present

## 2019-03-28 DIAGNOSIS — J9611 Chronic respiratory failure with hypoxia: Secondary | ICD-10-CM | POA: Diagnosis not present

## 2019-03-29 ENCOUNTER — Inpatient Hospital Stay: Payer: BC Managed Care – PPO

## 2019-03-29 ENCOUNTER — Encounter: Payer: Self-pay | Admitting: *Deleted

## 2019-03-29 ENCOUNTER — Other Ambulatory Visit: Payer: Self-pay

## 2019-03-29 ENCOUNTER — Ambulatory Visit: Payer: Self-pay

## 2019-03-29 ENCOUNTER — Encounter (HOSPITAL_BASED_OUTPATIENT_CLINIC_OR_DEPARTMENT_OTHER): Payer: Self-pay

## 2019-03-29 ENCOUNTER — Ambulatory Visit (HOSPITAL_BASED_OUTPATIENT_CLINIC_OR_DEPARTMENT_OTHER)
Admission: RE | Admit: 2019-03-29 | Discharge: 2019-03-29 | Disposition: A | Discharge status: Home / Self Care | Payer: BC Managed Care – PPO | Source: Ambulatory Visit | Attending: Hematology | Admitting: Hematology

## 2019-03-29 ENCOUNTER — Encounter: Payer: Self-pay | Admitting: Family Medicine

## 2019-03-29 ENCOUNTER — Encounter: Payer: Self-pay | Admitting: Hematology

## 2019-03-29 ENCOUNTER — Ambulatory Visit: Payer: Self-pay | Admitting: Hematology

## 2019-03-29 ENCOUNTER — Inpatient Hospital Stay (HOSPITAL_BASED_OUTPATIENT_CLINIC_OR_DEPARTMENT_OTHER): Payer: BC Managed Care – PPO | Admitting: Hematology

## 2019-03-29 VITALS — BP 105/73 | HR 108 | Temp 97.3°F | Resp 18 | Ht 64.0 in | Wt 222.0 lb

## 2019-03-29 DIAGNOSIS — T451X5A Adverse effect of antineoplastic and immunosuppressive drugs, initial encounter: Secondary | ICD-10-CM | POA: Diagnosis not present

## 2019-03-29 DIAGNOSIS — C50812 Malignant neoplasm of overlapping sites of left female breast: Secondary | ICD-10-CM

## 2019-03-29 DIAGNOSIS — Z171 Estrogen receptor negative status [ER-]: Secondary | ICD-10-CM | POA: Diagnosis not present

## 2019-03-29 DIAGNOSIS — C50919 Malignant neoplasm of unspecified site of unspecified female breast: Secondary | ICD-10-CM

## 2019-03-29 DIAGNOSIS — C7801 Secondary malignant neoplasm of right lung: Secondary | ICD-10-CM | POA: Diagnosis not present

## 2019-03-29 DIAGNOSIS — J910 Malignant pleural effusion: Secondary | ICD-10-CM | POA: Diagnosis not present

## 2019-03-29 DIAGNOSIS — G893 Neoplasm related pain (acute) (chronic): Secondary | ICD-10-CM | POA: Diagnosis not present

## 2019-03-29 DIAGNOSIS — C7931 Secondary malignant neoplasm of brain: Secondary | ICD-10-CM | POA: Diagnosis not present

## 2019-03-29 DIAGNOSIS — D6481 Anemia due to antineoplastic chemotherapy: Secondary | ICD-10-CM | POA: Diagnosis not present

## 2019-03-29 DIAGNOSIS — Z95828 Presence of other vascular implants and grafts: Secondary | ICD-10-CM

## 2019-03-29 DIAGNOSIS — C7951 Secondary malignant neoplasm of bone: Secondary | ICD-10-CM | POA: Diagnosis not present

## 2019-03-29 DIAGNOSIS — Z79899 Other long term (current) drug therapy: Secondary | ICD-10-CM | POA: Diagnosis not present

## 2019-03-29 DIAGNOSIS — D72829 Elevated white blood cell count, unspecified: Secondary | ICD-10-CM | POA: Diagnosis not present

## 2019-03-29 DIAGNOSIS — Z5112 Encounter for antineoplastic immunotherapy: Secondary | ICD-10-CM | POA: Diagnosis not present

## 2019-03-29 DIAGNOSIS — C7802 Secondary malignant neoplasm of left lung: Secondary | ICD-10-CM | POA: Diagnosis not present

## 2019-03-29 DIAGNOSIS — R590 Localized enlarged lymph nodes: Secondary | ICD-10-CM | POA: Diagnosis not present

## 2019-03-29 DIAGNOSIS — C787 Secondary malignant neoplasm of liver and intrahepatic bile duct: Secondary | ICD-10-CM | POA: Diagnosis not present

## 2019-03-29 LAB — CBC WITH DIFFERENTIAL (CANCER CENTER ONLY)
Abs Immature Granulocytes: 0.09 10*3/uL — ABNORMAL HIGH (ref 0.00–0.07)
Basophils Absolute: 0 10*3/uL (ref 0.0–0.1)
Basophils Relative: 0 %
Eosinophils Absolute: 0 10*3/uL (ref 0.0–0.5)
Eosinophils Relative: 0 %
HCT: 35.6 % — ABNORMAL LOW (ref 36.0–46.0)
Hemoglobin: 10.5 g/dL — ABNORMAL LOW (ref 12.0–15.0)
Immature Granulocytes: 1 %
Lymphocytes Relative: 7 %
Lymphs Abs: 0.5 10*3/uL — ABNORMAL LOW (ref 0.7–4.0)
MCH: 26.1 pg (ref 26.0–34.0)
MCHC: 29.5 g/dL — ABNORMAL LOW (ref 30.0–36.0)
MCV: 88.3 fL (ref 80.0–100.0)
Monocytes Absolute: 1 10*3/uL (ref 0.1–1.0)
Monocytes Relative: 13 %
Neutro Abs: 6.1 10*3/uL (ref 1.7–7.7)
Neutrophils Relative %: 79 %
Platelet Count: 384 10*3/uL (ref 150–400)
RBC: 4.03 MIL/uL (ref 3.87–5.11)
RDW: 18.9 % — ABNORMAL HIGH (ref 11.5–15.5)
WBC Count: 7.7 10*3/uL (ref 4.0–10.5)
nRBC: 0 % (ref 0.0–0.2)

## 2019-03-29 LAB — CMP (CANCER CENTER ONLY)
ALT: 21 U/L (ref 0–44)
AST: 38 U/L (ref 15–41)
Albumin: 2.7 g/dL — ABNORMAL LOW (ref 3.5–5.0)
Alkaline Phosphatase: 96 U/L (ref 38–126)
Anion gap: 5 (ref 5–15)
BUN: 12 mg/dL (ref 6–20)
CO2: 34 mmol/L — ABNORMAL HIGH (ref 22–32)
Calcium: 8.7 mg/dL — ABNORMAL LOW (ref 8.9–10.3)
Chloride: 97 mmol/L — ABNORMAL LOW (ref 98–111)
Creatinine: 0.91 mg/dL (ref 0.44–1.00)
GFR, Est AFR Am: >60 mL/min (ref >60)
GFR, Estimated: >60 mL/min (ref >60)
Glucose, Bld: 95 mg/dL (ref 70–99)
Potassium: 4.7 mmol/L (ref 3.5–5.1)
Sodium: 136 mmol/L (ref 135–145)
Total Bilirubin: 0.4 mg/dL (ref 0.3–1.2)
Total Protein: 6.2 g/dL — ABNORMAL LOW (ref 6.5–8.1)

## 2019-03-29 LAB — MAGNESIUM: Magnesium: 2.2 mg/dL (ref 1.7–2.4)

## 2019-03-29 MED ORDER — SODIUM CHLORIDE 0.9% FLUSH
10.0000 mL | INTRAVENOUS | Status: DC | PRN
  Administered 2019-03-29: 10 mL via INTRAVENOUS
  Filled 2019-03-29: qty 10

## 2019-03-29 MED ORDER — ALTEPLASE 2 MG IJ SOLR
2.0000 mg | Freq: Once | INTRAMUSCULAR | Status: AC | PRN
  Administered 2019-03-29: 12:00:00 2 mg
  Filled 2019-03-29: qty 2

## 2019-03-29 MED ORDER — FENTANYL 50 MCG/HR TD PT72: 1.0000 | MEDICATED_PATCH | TRANSDERMAL | 0 refills | Status: AC

## 2019-03-29 MED ORDER — IOHEXOL 300 MG/ML  SOLN
100.0000 mL | Freq: Once | INTRAMUSCULAR | Status: AC | PRN
  Administered 2019-03-29: 100 mL via INTRAVENOUS

## 2019-03-29 MED ORDER — HEPARIN SOD (PORK) LOCK FLUSH 100 UNIT/ML IV SOLN
500.0000 [IU] | Freq: Once | INTRAVENOUS | Status: AC
  Administered 2019-03-29: 500 [IU] via INTRAVENOUS
  Filled 2019-03-29: qty 5

## 2019-03-29 MED ORDER — HEPARIN SOD (PORK) LOCK FLUSH 100 UNIT/ML IV SOLN
500.0000 [IU] | Freq: Once | INTRAVENOUS | Status: AC
  Administered 2019-03-29: 13:00:00 500 [IU] via INTRAVENOUS
  Filled 2019-03-29: qty 5

## 2019-03-29 NOTE — Progress Notes (Signed)
1300 Alteplase aspirated with sluggish blood return

## 2019-03-29 NOTE — Progress Notes (Signed)
Port was accessed with no blood return. Cathflo activase placed at 11:52.

## 2019-03-30 ENCOUNTER — Ambulatory Visit
Admit: 2019-03-30 | Discharge: 2019-03-30 | Disposition: A | Discharge status: Home / Self Care | Payer: BC Managed Care – PPO | Attending: Radiation Oncology | Admitting: Radiation Oncology

## 2019-03-30 DIAGNOSIS — C801 Malignant (primary) neoplasm, unspecified: Secondary | ICD-10-CM | POA: Diagnosis not present

## 2019-03-30 DIAGNOSIS — C7931 Secondary malignant neoplasm of brain: Secondary | ICD-10-CM

## 2019-03-30 DIAGNOSIS — C7951 Secondary malignant neoplasm of bone: Secondary | ICD-10-CM | POA: Diagnosis not present

## 2019-03-30 MED ORDER — GADOBENATE DIMEGLUMINE 529 MG/ML IV SOLN
20.0000 mL | Freq: Once | INTRAVENOUS | Status: AC | PRN
  Administered 2019-03-30: 20 mL via INTRAVENOUS

## 2019-04-02 DIAGNOSIS — T2101XD Burn of unspecified degree of chest wall, subsequent encounter: Secondary | ICD-10-CM | POA: Diagnosis not present

## 2019-04-02 DIAGNOSIS — J910 Malignant pleural effusion: Secondary | ICD-10-CM | POA: Diagnosis not present

## 2019-04-02 DIAGNOSIS — C50812 Malignant neoplasm of overlapping sites of left female breast: Secondary | ICD-10-CM | POA: Diagnosis not present

## 2019-04-02 DIAGNOSIS — J9611 Chronic respiratory failure with hypoxia: Secondary | ICD-10-CM | POA: Diagnosis not present

## 2019-04-02 DIAGNOSIS — C7931 Secondary malignant neoplasm of brain: Secondary | ICD-10-CM | POA: Diagnosis not present

## 2019-04-03 ENCOUNTER — Other Ambulatory Visit: Payer: Self-pay | Admitting: *Deleted

## 2019-04-03 ENCOUNTER — Other Ambulatory Visit: Payer: Self-pay | Admitting: Hematology

## 2019-04-03 DIAGNOSIS — G893 Neoplasm related pain (acute) (chronic): Secondary | ICD-10-CM

## 2019-04-03 DIAGNOSIS — C50919 Malignant neoplasm of unspecified site of unspecified female breast: Secondary | ICD-10-CM

## 2019-04-03 DIAGNOSIS — C50812 Malignant neoplasm of overlapping sites of left female breast: Secondary | ICD-10-CM

## 2019-04-03 DIAGNOSIS — N6320 Unspecified lump in the left breast, unspecified quadrant: Secondary | ICD-10-CM

## 2019-04-03 DIAGNOSIS — C7931 Secondary malignant neoplasm of brain: Secondary | ICD-10-CM

## 2019-04-03 MED ORDER — DEXAMETHASONE 4 MG PO TABS: 8.0000 mg | ORAL_TABLET | Freq: Every day | ORAL | 1 refills | Status: AC

## 2019-04-03 NOTE — Progress Notes (Signed)
ON PATHWAY REGIMEN - Breast  No Change  Continue With Treatment as Ordered.     A cycle is every 21 days:     Ado-trastuzumab emtansine   **Always confirm dose/schedule in your pharmacy ordering system**  Patient Characteristics: Distant Metastases or Locoregional Recurrent Disease - Unresected or Locally Advanced Unresectable Disease Progressing after Neoadjuvant and Local Therapies, HER2 Positive, ER Negative/Unknown, Chemotherapy, Second Line Therapeutic Status: Distant Metastases BRCA Mutation Status: Absent ER Status: Negative (-) HER2 Status: Positive (+) PR Status: Negative (-) Line of Therapy: Second Line Intent of Therapy: Non-Curative / Palliative Intent, Discussed with Patient

## 2019-04-03 NOTE — Progress Notes (Signed)
DISCONTINUE ON PATHWAY REGIMEN - Breast     A cycle is every 21 days:     Ado-trastuzumab emtansine   **Always confirm dose/schedule in your pharmacy ordering system**  REASON: Disease Progression PRIOR TREATMENT: [id number]: Ado-trastuzumab Emtansine 3.6 mg/kg q21 Days TREATMENT RESPONSE: Progressive Disease (PD)  START OFF PATHWAY REGIMEN - Breast   [id number]:Paclitaxel + Carboplatin + Trastuzumab q21 Days:   A cycle is every 21 days:     Paclitaxel      Carboplatin      Trastuzumab-xxxx      Trastuzumab-xxxx   **Always confirm dose/schedule in your pharmacy ordering system**  Patient Characteristics: Distant Metastases or Locoregional Recurrent Disease - Unresected or Locally Advanced Unresectable Disease Progressing after Neoadjuvant and Local Therapies, HER2 Positive, ER Negative/Unknown, Chemotherapy, Third Line Therapeutic Status: Distant Metastases BRCA Mutation Status: Absent ER Status: Negative (-) HER2 Status: Positive (+) PR Status: Negative (-) Line of Therapy: Third Line Intent of Therapy: Non-Curative / Palliative Intent, Discussed with Patient

## 2019-04-04 ENCOUNTER — Telehealth: Payer: Self-pay | Admitting: *Deleted

## 2019-04-04 ENCOUNTER — Encounter: Payer: Self-pay | Admitting: Radiation Oncology

## 2019-04-04 ENCOUNTER — Other Ambulatory Visit: Payer: Self-pay | Admitting: *Deleted

## 2019-04-04 ENCOUNTER — Other Ambulatory Visit: Payer: Self-pay

## 2019-04-04 ENCOUNTER — Other Ambulatory Visit: Payer: Self-pay | Admitting: Radiation Therapy

## 2019-04-04 ENCOUNTER — Ambulatory Visit
Admit: 2019-04-04 | Discharge: 2019-04-04 | Disposition: A | Discharge status: Home / Self Care | Payer: BC Managed Care – PPO | Attending: Radiation Oncology | Admitting: Radiation Oncology

## 2019-04-04 DIAGNOSIS — C50812 Malignant neoplasm of overlapping sites of left female breast: Secondary | ICD-10-CM | POA: Diagnosis not present

## 2019-04-04 DIAGNOSIS — C7931 Secondary malignant neoplasm of brain: Secondary | ICD-10-CM

## 2019-04-04 DIAGNOSIS — C770 Secondary and unspecified malignant neoplasm of lymph nodes of head, face and neck: Secondary | ICD-10-CM | POA: Diagnosis not present

## 2019-04-04 DIAGNOSIS — T2101XD Burn of unspecified degree of chest wall, subsequent encounter: Secondary | ICD-10-CM | POA: Diagnosis not present

## 2019-04-04 DIAGNOSIS — C7949 Secondary malignant neoplasm of other parts of nervous system: Secondary | ICD-10-CM

## 2019-04-04 DIAGNOSIS — Z923 Personal history of irradiation: Secondary | ICD-10-CM | POA: Diagnosis not present

## 2019-04-04 DIAGNOSIS — J910 Malignant pleural effusion: Secondary | ICD-10-CM | POA: Diagnosis not present

## 2019-04-04 DIAGNOSIS — G893 Neoplasm related pain (acute) (chronic): Secondary | ICD-10-CM

## 2019-04-04 DIAGNOSIS — N6320 Unspecified lump in the left breast, unspecified quadrant: Secondary | ICD-10-CM

## 2019-04-04 DIAGNOSIS — C50919 Malignant neoplasm of unspecified site of unspecified female breast: Secondary | ICD-10-CM

## 2019-04-04 DIAGNOSIS — J9611 Chronic respiratory failure with hypoxia: Secondary | ICD-10-CM | POA: Diagnosis not present

## 2019-04-04 DIAGNOSIS — C50912 Malignant neoplasm of unspecified site of left female breast: Secondary | ICD-10-CM | POA: Diagnosis not present

## 2019-04-04 MED ORDER — OXYCODONE HCL 5 MG PO TABS: 10.0000 mg | ORAL_TABLET | ORAL | 0 refills | Status: AC | PRN

## 2019-04-04 NOTE — Telephone Encounter (Signed)
Received very teary phone call from patient regarding her pain medication.  She discontinued her Morphine Rx because she thought she would be able to get the Fentanyl patch.  However Fentanyl is delayed at [facility] due to prior auth issues.  May be Thursday or Friday until this is ready.  Patient asked about Oxycodone Rx that was to be called in.  [facility] sent in Rx for partial fill for patient.  [facility] called patient regarding delay in Fentanyl being filled.  Patient called with this information.

## 2019-04-04 NOTE — Progress Notes (Signed)
Radiation Oncology         [phone] ________________________________  Outpatient Follow Up - Conducted via telephone due to current COVID-19 concerns for limiting patient exposure  I spoke with the patient to conduct this consult visit via telephone to spare the patient unnecessary potential exposure in the healthcare setting during the current COVID-19 pandemic. The patient was notified in advance and was offered a Telemedicine meeting to allow for face to face communication but unfortunately reported that they did not have the appropriate resources/technology to support such a visit and instead preferred to proceed with a telephone visit. 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  Interval Since Last Radiation:  4 months  11/08/2018 SRS Treatment: PTV1: Right Frontal 36m // 20 Gy in 1 fraction  10/23/2018 - 11/07/2018:  Left Breast / 36 Gy in 12 fractions  Narrative: In summary this is a pleasant 43y.o. 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 Her breast was treated as well as a right frontal brain metastasis. She has been under the care of Dr. ZMaylon Peppersin medical oncology. Unfortunately she has had rapid progression of disease in the chest and nodes of the neck. She had a pleurex catheter placed and subsequent replacement of this a few weeks ago. A recent CT of the CAP revealed multifocal liver disease, progrossive thoracic adenopathy, new retroperitoneal adenopathy, and persistent bilateral pleural effusions with worsening of the left lung aeration. She also had new skin thickening in the right breast concerning for metastatic disease to the right breast. A CT of the neck also showed widespread nodal disease in the supraclavicular, cervical and posterior  triangle region.  All of this growth was despite being on Herceptin/Perjeta and Taxotere. She is planning to begin a new systemic course with Kadcyla on 04/10/2019. She also had an MRI of the brain on 03/30/2019 that revealed resolution of the right frontal lesion, with a possible new area measuring 12 mm in the superficial left cerebellum. She had a large parietal bone metastasis as well with scalp involvement. She is contacted to discuss her imaging findings.   On review of systems, the patient reports that she is not doing very well. She is draining her pleurex twice a day and is getting over 500 cc each time. She reports that she has told Dr. ZMaylon Peppersabout this. She is very short winded and is confused, taking multiple seconds to respond to questions, and does not remember what she is trying to say on multiple occasions during the discussion. No other complaints are noted.    Past Medical History:  Past Medical History:  Diagnosis Date   Arthritis    [place]   Family history of breast cancer    Family history of colon cancer     Past Surgical History: Past Surgical History:  Procedure Laterality Date   IR IMAGING GUIDED PORT INSERTION  10/19/2018   IR PERC PLEURAL DRAIN W/INDWELL CATH W/IMG GUIDE  11/01/2018   IR PERC PLEURAL DRAIN W/INDWELL CATH W/IMG GUIDE  03/07/2019   IR REMOVAL OF PLURAL CATH W/CUFF  02/16/2019   IR SINUS/FIST TUBE CHK-NON GI  12/06/2018   IR THORACENTESIS ASP PLEURAL SPACE W/IMG GUIDE  02/19/2019   IR [place] BX ASP/DRAIN  10/19/2018  PATELLA REALIGNMENT Left 1992    Social History:  Social History   Socioeconomic History   Marital status: Single    Spouse name: Not on file   Number of children: Not on file   Years of education: Not on file   Highest education level: Not on file  Occupational History   Not on file  Social Needs   Financial resource strain: Not on file   Food insecurity    Worry: Not on file    Inability: Not  on file   Transportation needs    Medical: No    Non-medical: No  Tobacco Use   Smoking status: Former Smoker    Types: Cigarettes    Quit date: 10/09/2018    Years since quitting: 0.4   Smokeless tobacco: Never Used  Substance and Sexual Activity   Alcohol use: Not Currently   Drug use: Never   Sexual activity: Not Currently  Lifestyle   Physical activity    Days per week: Not on file    Minutes per session: Not on file   Stress: Not on file  Relationships   Social connections    Talks on phone: Not on file    Gets together: Not on file    Attends religious service: Not on file    Active member of club or organization: Not on file    Attends meetings of clubs or organizations: Not on file    Relationship status: Not on file   Intimate partner violence    Fear of current or ex partner: Not on file    Emotionally abused: Not on file    Physically abused: Not on file    Forced sexual activity: Not on file  Other Topics Concern   Not on file  Social History Narrative   Not on file  The patient is single. 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 Her mother is taking care of her and she has a 27 year old son.   Family History: Family History  Problem Relation Age of Onset   Arthritis Mother    Diabetes Father    Hypertension Father    Arthritis Father    Heart disease Father    Early death Maternal Grandmother    Asthma Maternal Grandfather    Heart disease Maternal Grandfather    Migraines Maternal Grandfather    Early death Maternal Grandfather    Asthma Paternal Grandmother    Hypertension Paternal Grandmother    Asthma Paternal Grandfather    Hypertension Paternal Grandfather    Breast cancer Other        paternal cousin, female   Colon cancer Cousin        paternal   Breast cancer Cousin        paternal   Lung cancer Other    Medications:   Current Outpatient Medications  Medication Sig Dispense Refill    cyclobenzaprine (FLEXERIL) 10 MG tablet Take 1 tablet (10 mg total) by mouth 3 (three) times daily as needed for muscle spasms. 30 tablet 0   gabapentin (NEURONTIN) 300 MG capsule Take 3 capsules (900 mg total) by mouth 2 (two) times daily. 180 capsule 5   guaiFENesin (MUCINEX) 600 MG 12 hr tablet Take 600 mg by mouth 2 (two) times daily.     ipratropium (ATROVENT HFA) 17 MCG/ACT inhaler Inhale 2 puffs into the lungs every 4 (four) hours as needed for wheezing. 1 Inhaler 12   lidocaine-prilocaine (EMLA) cream Apply  to affected area once 30 g 3   LORazepam (ATIVAN) 0.5 MG tablet Take 1 tablet (0.5 mg total) by mouth every 6 (six) hours as needed (Nausea or vomiting). 50 tablet 0   metoprolol tartrate (LOPRESSOR) 25 MG tablet Take 0.5 tablets (12.5 mg total) by mouth 2 (two) times daily. 30 tablet 1   ondansetron (ZOFRAN) 8 MG tablet Take 1 tablet (8 mg total) by mouth 2 (two) times daily as needed for refractory nausea / vomiting. 30 tablet 4   prochlorperazine (COMPAZINE) 10 MG tablet Take 1 tablet (10 mg total) by mouth every 6 (six) hours as needed (Nausea or vomiting). 30 tablet 4   UNABLE TO FIND As per Medical necessity, Mastectomy Bra Q#2-Dx Code C50.812, Z17.1 Malignant neoplasm of overlapping sites of left breast in female, estrogen receptor negative 2 each 0   dexamethasone (DECADRON) 4 MG tablet Take 2 tablets (8 mg total) by mouth daily. Start the day after carboplatin chemotherapy for 3 days. (Patient not taking: Reported on 04/04/2019) 30 tablet 1   fentaNYL (DURAGESIC) 50 MCG/HR Place 1 patch onto the skin every 3 (three) days. (Patient not taking: Reported on 04/04/2019) 10 patch 0   oxyCODONE (OXY IR/ROXICODONE) 5 MG immediate release tablet Take 2 tablets (10 mg total) by mouth every 4 (four) hours as needed for up to 10 days for severe pain. 120 tablet 0   No current facility-administered medications for this encounter.     Allergies: Allergies  Allergen Reactions    Aspirin Other (See Comments)    CHILDHOOD REACTION:UNKNOWN   PE: Unable to examine patient due to encounter type, though the patient at time is confused during our conversation and tachypneic, having to take multiple seconds between breaths.   Impression/Plan: 1. 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 Dr. Lisbeth Renshaw reviewed the findings on her most recent imaging of the neck and brain. She needs further evaluation of the new cerebellar lesion with MRI under anesthesia and we would anticipate treating this with SRS. For her neck adenopathy, Dr. Lisbeth Renshaw also offers a palliative course of radiotherapy. We reviewed the risks, benefits, short and long term effects of treatment. She is interested in proceeding and will come for simulation tomorrow. We will sign consent at that time.  2. Goals of care. I did contact Dr. Maylon Peppers to let him know our concerns for her clinical course as she seems to be clinically worsening. He will be available tomorrow to discuss her ability to simulate for treatment. We will continue to keep in contact regarding her treatment ability, but I'm suspicious she may be starting to transitioning toward end of life.   Given current concerns for patient exposure during the COVID-19 pandemic, this encounter was conducted via telephone.  The patient has given verbal consent for this type of encounter. The time spent during this encounter was 25 minutes and 50% of that time was spent in the coordination of her care.  The attendants for this meeting included Dr. Lisbeth Renshaw, Shona Simpson, [facility] and [name]  During the encounter, Dr. Lisbeth Renshaw and Shona Simpson [facility] were located at [facility] Radiation Oncology Department.  Samanvi Cuccia  was located at home.    Carola Rhine, PAC

## 2019-04-05 ENCOUNTER — Ambulatory Visit
Admission: RE | Admit: 2019-04-05 | Discharge: 2019-04-05 | Disposition: A | Discharge status: Home / Self Care | Payer: BC Managed Care – PPO | Source: Ambulatory Visit | Attending: Radiation Oncology | Admitting: Radiation Oncology

## 2019-04-06 ENCOUNTER — Ambulatory Visit: Payer: Self-pay

## 2019-04-06 ENCOUNTER — Ambulatory Visit: Payer: Self-pay | Admitting: Hematology

## 2019-04-06 ENCOUNTER — Other Ambulatory Visit: Payer: Self-pay

## 2019-04-06 NOTE — Progress Notes (Signed)
The patient no showed her appointment today. Our staff called to reschedule her simulation. I also let Dr. Maylon Peppers know and he plans to follow up with her as well.

## 2019-04-08 ENCOUNTER — Inpatient Hospital Stay (HOSPITAL_COMMUNITY)
Admission: EM | Admit: 2019-04-08 | Discharge: 2019-05-10 | DRG: 871 | Disposition: E | Payer: BC Managed Care – PPO | Attending: Family Medicine | Admitting: Family Medicine

## 2019-04-08 ENCOUNTER — Emergency Department (HOSPITAL_COMMUNITY): Payer: BC Managed Care – PPO

## 2019-04-08 ENCOUNTER — Encounter (HOSPITAL_COMMUNITY): Payer: Self-pay | Admitting: Emergency Medicine

## 2019-04-08 ENCOUNTER — Other Ambulatory Visit: Payer: Self-pay

## 2019-04-08 DIAGNOSIS — C7801 Secondary malignant neoplasm of right lung: Secondary | ICD-10-CM | POA: Diagnosis present

## 2019-04-08 DIAGNOSIS — Z515 Encounter for palliative care: Secondary | ICD-10-CM | POA: Diagnosis not present

## 2019-04-08 DIAGNOSIS — Z8261 Family history of arthritis: Secondary | ICD-10-CM

## 2019-04-08 DIAGNOSIS — F419 Anxiety disorder, unspecified: Secondary | ICD-10-CM | POA: Diagnosis present

## 2019-04-08 DIAGNOSIS — J910 Malignant pleural effusion: Secondary | ICD-10-CM | POA: Diagnosis present

## 2019-04-08 DIAGNOSIS — J9690 Respiratory failure, unspecified, unspecified whether with hypoxia or hypercapnia: Secondary | ICD-10-CM | POA: Diagnosis not present

## 2019-04-08 DIAGNOSIS — R6520 Severe sepsis without septic shock: Secondary | ICD-10-CM | POA: Diagnosis not present

## 2019-04-08 DIAGNOSIS — C50912 Malignant neoplasm of unspecified site of left female breast: Secondary | ICD-10-CM | POA: Diagnosis present

## 2019-04-08 DIAGNOSIS — R451 Restlessness and agitation: Secondary | ICD-10-CM | POA: Diagnosis not present

## 2019-04-08 DIAGNOSIS — Z801 Family history of malignant neoplasm of trachea, bronchus and lung: Secondary | ICD-10-CM | POA: Diagnosis not present

## 2019-04-08 DIAGNOSIS — E46 Unspecified protein-calorie malnutrition: Secondary | ICD-10-CM | POA: Diagnosis not present

## 2019-04-08 DIAGNOSIS — J9600 Acute respiratory failure, unspecified whether with hypoxia or hypercapnia: Secondary | ICD-10-CM | POA: Diagnosis not present

## 2019-04-08 DIAGNOSIS — J9620 Acute and chronic respiratory failure, unspecified whether with hypoxia or hypercapnia: Secondary | ICD-10-CM | POA: Diagnosis not present

## 2019-04-08 DIAGNOSIS — C44501 Unspecified malignant neoplasm of skin of breast: Secondary | ICD-10-CM | POA: Diagnosis not present

## 2019-04-08 DIAGNOSIS — M1990 Unspecified osteoarthritis, unspecified site: Secondary | ICD-10-CM | POA: Diagnosis present

## 2019-04-08 DIAGNOSIS — L83 Acanthosis nigricans: Secondary | ICD-10-CM | POA: Diagnosis present

## 2019-04-08 DIAGNOSIS — R590 Localized enlarged lymph nodes: Secondary | ICD-10-CM | POA: Diagnosis present

## 2019-04-08 DIAGNOSIS — Z803 Family history of malignant neoplasm of breast: Secondary | ICD-10-CM | POA: Diagnosis not present

## 2019-04-08 DIAGNOSIS — J9622 Acute and chronic respiratory failure with hypercapnia: Secondary | ICD-10-CM | POA: Diagnosis not present

## 2019-04-08 DIAGNOSIS — Z800 Family history of malignant neoplasm of digestive organs: Secondary | ICD-10-CM | POA: Diagnosis not present

## 2019-04-08 DIAGNOSIS — C787 Secondary malignant neoplasm of liver and intrahepatic bile duct: Secondary | ICD-10-CM | POA: Diagnosis present

## 2019-04-08 DIAGNOSIS — J189 Pneumonia, unspecified organism: Secondary | ICD-10-CM | POA: Diagnosis not present

## 2019-04-08 DIAGNOSIS — Z6841 Body Mass Index (BMI) 40.0 and over, adult: Secondary | ICD-10-CM | POA: Diagnosis not present

## 2019-04-08 DIAGNOSIS — E669 Obesity, unspecified: Secondary | ICD-10-CM | POA: Diagnosis present

## 2019-04-08 DIAGNOSIS — Z7189 Other specified counseling: Secondary | ICD-10-CM | POA: Diagnosis not present

## 2019-04-08 DIAGNOSIS — R05 Cough: Secondary | ICD-10-CM | POA: Diagnosis not present

## 2019-04-08 DIAGNOSIS — Z87891 Personal history of nicotine dependence: Secondary | ICD-10-CM | POA: Diagnosis not present

## 2019-04-08 DIAGNOSIS — J9621 Acute and chronic respiratory failure with hypoxia: Secondary | ICD-10-CM | POA: Diagnosis present

## 2019-04-08 DIAGNOSIS — Z20828 Contact with and (suspected) exposure to other viral communicable diseases: Secondary | ICD-10-CM | POA: Diagnosis present

## 2019-04-08 DIAGNOSIS — J181 Lobar pneumonia, unspecified organism: Secondary | ICD-10-CM | POA: Diagnosis not present

## 2019-04-08 DIAGNOSIS — C50919 Malignant neoplasm of unspecified site of unspecified female breast: Secondary | ICD-10-CM | POA: Diagnosis not present

## 2019-04-08 DIAGNOSIS — C7931 Secondary malignant neoplasm of brain: Secondary | ICD-10-CM | POA: Diagnosis present

## 2019-04-08 DIAGNOSIS — J9602 Acute respiratory failure with hypercapnia: Secondary | ICD-10-CM | POA: Diagnosis not present

## 2019-04-08 DIAGNOSIS — Z978 Presence of other specified devices: Secondary | ICD-10-CM

## 2019-04-08 DIAGNOSIS — Z8249 Family history of ischemic heart disease and other diseases of the circulatory system: Secondary | ICD-10-CM

## 2019-04-08 DIAGNOSIS — A419 Sepsis, unspecified organism: Secondary | ICD-10-CM | POA: Diagnosis not present

## 2019-04-08 DIAGNOSIS — Z79899 Other long term (current) drug therapy: Secondary | ICD-10-CM

## 2019-04-08 DIAGNOSIS — Z9221 Personal history of antineoplastic chemotherapy: Secondary | ICD-10-CM

## 2019-04-08 DIAGNOSIS — Z886 Allergy status to analgesic agent status: Secondary | ICD-10-CM

## 2019-04-08 DIAGNOSIS — Z825 Family history of asthma and other chronic lower respiratory diseases: Secondary | ICD-10-CM | POA: Diagnosis not present

## 2019-04-08 DIAGNOSIS — C7951 Secondary malignant neoplasm of bone: Secondary | ICD-10-CM | POA: Diagnosis present

## 2019-04-08 DIAGNOSIS — Y95 Nosocomial condition: Secondary | ICD-10-CM | POA: Diagnosis present

## 2019-04-08 DIAGNOSIS — R0989 Other specified symptoms and signs involving the circulatory and respiratory systems: Secondary | ICD-10-CM | POA: Diagnosis not present

## 2019-04-08 DIAGNOSIS — R4182 Altered mental status, unspecified: Secondary | ICD-10-CM | POA: Diagnosis not present

## 2019-04-08 DIAGNOSIS — R079 Chest pain, unspecified: Secondary | ICD-10-CM | POA: Diagnosis not present

## 2019-04-08 DIAGNOSIS — Z66 Do not resuscitate: Secondary | ICD-10-CM | POA: Diagnosis not present

## 2019-04-08 DIAGNOSIS — J9601 Acute respiratory failure with hypoxia: Secondary | ICD-10-CM | POA: Diagnosis not present

## 2019-04-08 DIAGNOSIS — Z79891 Long term (current) use of opiate analgesic: Secondary | ICD-10-CM

## 2019-04-08 DIAGNOSIS — Z833 Family history of diabetes mellitus: Secondary | ICD-10-CM | POA: Diagnosis not present

## 2019-04-08 DIAGNOSIS — R740 Nonspecific elevation of levels of transaminase and lactic acid dehydrogenase [LDH]: Secondary | ICD-10-CM | POA: Diagnosis present

## 2019-04-08 DIAGNOSIS — Z9981 Dependence on supplemental oxygen: Secondary | ICD-10-CM

## 2019-04-08 DIAGNOSIS — R0602 Shortness of breath: Secondary | ICD-10-CM | POA: Diagnosis not present

## 2019-04-08 LAB — COMPREHENSIVE METABOLIC PANEL
ALT: 27 U/L (ref 0–44)
AST: 49 U/L — ABNORMAL HIGH (ref 15–41)
Albumin: 2.4 g/dL — ABNORMAL LOW (ref 3.5–5.0)
Alkaline Phosphatase: 145 U/L — ABNORMAL HIGH (ref 38–126)
Anion gap: 10 (ref 5–15)
BUN: 14 mg/dL (ref 6–20)
CO2: 29 mmol/L (ref 22–32)
Calcium: 9.1 mg/dL (ref 8.9–10.3)
Chloride: 101 mmol/L (ref 98–111)
Creatinine, Ser: 0.85 mg/dL (ref 0.44–1.00)
GFR calc Af Amer: >60 mL/min (ref >60)
GFR calc non Af Amer: >60 mL/min (ref >60)
Glucose, Bld: 96 mg/dL (ref 70–99)
Potassium: 4.2 mmol/L (ref 3.5–5.1)
Sodium: 140 mmol/L (ref 135–145)
Total Bilirubin: 0.3 mg/dL (ref 0.3–1.2)
Total Protein: 6.9 g/dL (ref 6.5–8.1)

## 2019-04-08 LAB — CBC WITH DIFFERENTIAL/PLATELET
Abs Immature Granulocytes: 0.16 10*3/uL — ABNORMAL HIGH (ref 0.00–0.07)
Basophils Absolute: 0 10*3/uL (ref 0.0–0.1)
Basophils Relative: 0 %
Eosinophils Absolute: 0 10*3/uL (ref 0.0–0.5)
Eosinophils Relative: 0 %
HCT: 36.3 % (ref 36.0–46.0)
Hemoglobin: 10.5 g/dL — ABNORMAL LOW (ref 12.0–15.0)
Immature Granulocytes: 2 %
Lymphocytes Relative: 5 %
Lymphs Abs: 0.5 10*3/uL — ABNORMAL LOW (ref 0.7–4.0)
MCH: 25.9 pg — ABNORMAL LOW (ref 26.0–34.0)
MCHC: 28.9 g/dL — ABNORMAL LOW (ref 30.0–36.0)
MCV: 89.6 fL (ref 80.0–100.0)
Monocytes Absolute: 0.9 10*3/uL (ref 0.1–1.0)
Monocytes Relative: 10 %
Neutro Abs: 8.1 10*3/uL — ABNORMAL HIGH (ref 1.7–7.7)
Neutrophils Relative %: 83 %
Platelets: 548 10*3/uL — ABNORMAL HIGH (ref 150–400)
RBC: 4.05 MIL/uL (ref 3.87–5.11)
RDW: 18.6 % — ABNORMAL HIGH (ref 11.5–15.5)
WBC: 9.7 10*3/uL (ref 4.0–10.5)
nRBC: 0 % (ref 0.0–0.2)

## 2019-04-08 LAB — LACTIC ACID, PLASMA: Lactic Acid, Venous: 1.7 mmol/L (ref 0.5–1.9)

## 2019-04-08 LAB — APTT: aPTT: 26 seconds (ref 24–36)

## 2019-04-08 LAB — PROTIME-INR
INR: 1 (ref 0.8–1.2)
Prothrombin Time: 13.2 seconds (ref 11.4–15.2)

## 2019-04-08 MED ORDER — SODIUM CHLORIDE 0.9 % IV SOLN
2.0000 g | INTRAVENOUS | Status: DC
  Administered 2019-04-08: 23:00:00 2 g via INTRAVENOUS
  Filled 2019-04-08: qty 20

## 2019-04-08 MED ORDER — IOHEXOL 350 MG/ML SOLN
100.0000 mL | Freq: Once | INTRAVENOUS | Status: AC | PRN
  Administered 2019-04-09: 100 mL via INTRAVENOUS

## 2019-04-08 MED ORDER — ALBUTEROL SULFATE HFA 108 (90 BASE) MCG/ACT IN AERS
4.0000 | INHALATION_SPRAY | Freq: Once | RESPIRATORY_TRACT | Status: AC
  Administered 2019-04-08: 4 via RESPIRATORY_TRACT
  Filled 2019-04-08: qty 6.7

## 2019-04-08 MED ORDER — SODIUM CHLORIDE 0.9 % IV SOLN
500.0000 mg | INTRAVENOUS | Status: DC
  Administered 2019-04-08: 500 mg via INTRAVENOUS
  Filled 2019-04-08: qty 500

## 2019-04-08 NOTE — ED Notes (Signed)
Spec to lab

## 2019-04-08 NOTE — ED Notes (Signed)
Unable to obtain EKG due to swelling and inability to place leads

## 2019-04-08 NOTE — ED Provider Notes (Signed)
[facility] EMERGENCY DEPARTMENT Provider Note   CSN: [id number] Arrival date & time: 04/07/2019  2056     History   Chief Complaint Chief Complaint  Patient presents with  . Shortness of Breath    HPI Sharon Floyd is a 43 y.o. female.     HPI   Sharon Floyd is a 43 y.o. female with a known history of metastatic breast cancer.  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  She is oxygen dependent on 3 to 4 L at home.  She is currently undergoing chemotherapy with her last treatment approximately 2 weeks ago.  she presents to the Emergency Department complaining of increasing shortness of breath for 2 days.  She reports productive cough of clear, thick sputum worsening since Friday and low-grade fever.  She also complains of decreased appetite and generalized fatigue.  She has a draining right Pleurx catheter for malignant right pleural effusion.  Her mother states that her activities around the house have been gradually declining recently.  Patient denies chest pain, vomiting, dysuria and known COVID exposures.  Pt is full code    PCP:  [facility]:  Dr. Maylon Peppers  Past Medical History:  Diagnosis Date  . Arthritis   . Cancer [facility])    breast  . Family history of breast cancer   . Family history of colon cancer     Patient Active Problem List   Diagnosis Date Noted  . Pneumonia due to infectious organism   . Thrombocytosis ([place])   . Antineoplastic chemotherapy induced anemia   . Acute on chronic respiratory failure with hypoxia ([place]) 03/06/2019  . Genetic testing 11/16/2018  . Normocytic anemia 11/03/2018  . Goals of care, counseling/discussion 11/03/2018  . Cancer-related pain 10/24/2018  . Metastasis to brain ([place])   . Pleural effusion, malignant 10/18/2018  . Elevated troponin 10/18/2018  . Malignant neoplasm of left female breast ([place])   . Acute respiratory failure with hypoxia ([place]) 10/17/2018  . Family history of breast cancer   . Family history  of colon cancer   . Family history of cancer 10/12/2018  . Metastatic breast cancer ([place]) 10/10/2018    Past Surgical History:  Procedure Laterality Date  . IR IMAGING GUIDED PORT INSERTION  10/19/2018  . IR PERC PLEURAL DRAIN W/INDWELL CATH W/IMG GUIDE  11/01/2018  . IR PERC PLEURAL DRAIN W/INDWELL CATH W/IMG GUIDE  03/07/2019  . IR REMOVAL OF PLURAL CATH W/CUFF  02/16/2019  . IR SINUS/FIST TUBE CHK-NON GI  12/06/2018  . IR THORACENTESIS ASP PLEURAL SPACE W/IMG GUIDE  02/19/2019  . IR US GUIDE BX ASP/DRAIN  10/19/2018  . PATELLA REALIGNMENT Left 1992     OB History   No obstetric history on file.      Home Medications    Prior to Admission medications   Medication Sig Start Date End Date Taking? Authorizing Provider  cyclobenzaprine (FLEXERIL) 10 MG tablet Take 1 tablet (10 mg total) by mouth 3 (three) times daily as needed for muscle spasms. 03/15/19   Tish Men, MD  dexamethasone (DECADRON) 4 MG tablet Take 2 tablets (8 mg total) by mouth daily. Start the day after carboplatin chemotherapy for 3 days. Patient not taking: Reported on 04/04/2019 04/03/19   Tish Men, MD  fentaNYL (DURAGESIC) 50 MCG/HR Place 1 patch onto the skin every 3 (three) days. Patient not taking: Reported on 04/04/2019 03/29/19 04/28/19  Tish Men, MD  gabapentin (NEURONTIN) 300 MG capsule Take 3 capsules (900 mg total)  by mouth 2 (two) times daily. 03/15/19 04/14/19  Tish Men, MD  guaiFENesin (MUCINEX) 600 MG 12 hr tablet Take 600 mg by mouth 2 (two) times daily.    [provider]  ipratropium (ATROVENT HFA) 17 MCG/ACT inhaler Inhale 2 puffs into the lungs every 4 (four) hours as needed for wheezing. 03/15/19   Tish Men, MD  lidocaine-prilocaine (EMLA) cream Apply to affected area once 11/03/18   Tish Men, MD  LORazepam (ATIVAN) 0.5 MG tablet Take 1 tablet (0.5 mg total) by mouth every 6 (six) hours as needed (Nausea or vomiting). 03/15/19   Tish Men, MD  metoprolol tartrate (LOPRESSOR) 25 MG tablet Take 0.5  tablets (12.5 mg total) by mouth 2 (two) times daily. 03/10/19 04/09/19  Donne Hazel, MD  ondansetron (ZOFRAN) 8 MG tablet Take 1 tablet (8 mg total) by mouth 2 (two) times daily as needed for refractory nausea / vomiting. 11/03/18   Tish Men, MD  oxyCODONE (OXY IR/ROXICODONE) 5 MG immediate release tablet Take 2 tablets (10 mg total) by mouth every 4 (four) hours as needed for up to 10 days for severe pain. 04/04/19 04/14/19  [place], Holli Humbles, NP  prochlorperazine (COMPAZINE) 10 MG tablet Take 1 tablet (10 mg total) by mouth every 6 (six) hours as needed (Nausea or vomiting). 11/03/18   Tish Men, MD  UNABLE TO FIND As per Medical necessity, Mastectomy Bra Q#2-Dx Code C50.812, Z17.1 Malignant neoplasm of overlapping sites of left breast in female, estrogen receptor negative 02/15/19   Tish Men, MD    Family History Family History  Problem Relation Age of Onset  . Arthritis Mother   . Diabetes Father   . Hypertension Father   . Arthritis Father   . Heart disease Father   . Early death Maternal Grandmother   . Asthma Maternal Grandfather   . Heart disease Maternal Grandfather   . Migraines Maternal Grandfather   . Early death Maternal Grandfather   . Asthma Paternal Grandmother   . Hypertension Paternal Grandmother   . Asthma Paternal Grandfather   . Hypertension Paternal Grandfather   . Breast cancer Other        paternal cousin, female  . Colon cancer Cousin        paternal  . Breast cancer Cousin        paternal  . Lung cancer Other     Social History Social History   Tobacco Use  . Smoking status: Former Smoker    Types: Cigarettes    Quit date: 10/09/2018    Years since quitting: 0.4  . Smokeless tobacco: Never Used  Substance Use Topics  . Alcohol use: Not Currently  . Drug use: Never     Allergies   Aspirin   Review of Systems Review of Systems  Constitutional: Positive for fever. Negative for appetite change and diaphoresis.  Respiratory: Positive for cough  and shortness of breath. Negative for wheezing.   Cardiovascular: Positive for chest pain (left breast pain).  Gastrointestinal: Negative for abdominal pain, diarrhea, nausea and vomiting.  Musculoskeletal: Positive for myalgias.  Skin: Negative for rash.  Neurological: Negative for dizziness, numbness and headaches.  Psychiatric/Behavioral: The patient is nervous/anxious.      Physical Exam Updated Vital Signs BP 125/86 (BP Location: Left Arm)   Pulse (!) 134   Temp 99.6 F (37.6 C) (Oral)   Resp (!) 28   Ht 5\' 4"  (1.626 m)   Wt 109.8 kg   SpO2 96%   BMI  41.54 kg/m   Physical Exam Vitals signs and nursing note reviewed.  Constitutional:      Appearance: She is ill-appearing.  HENT:     Head: Normocephalic.     Mouth/Throat:     Mouth: Mucous membranes are moist.  Neck:     Musculoskeletal: Normal range of motion. No muscular tenderness.  Cardiovascular:     Rate and Rhythm: Tachycardia present.     Pulses: Normal pulses.  Pulmonary:     Effort: Pulmonary effort is normal.     Breath sounds: Rhonchi (rhonchi on the right.  no lung sounds heard on the left.  ) present.     Comments: Pt on 4L O2 [place] Chest:     Chest wall: Tenderness present.  Abdominal:     General: There is no distension.     Palpations: Abdomen is soft.     Tenderness: There is no abdominal tenderness. There is no guarding.  Musculoskeletal:     Right lower leg: No edema.     Left lower leg: No edema.  Lymphadenopathy:     Cervical: No cervical adenopathy.  Skin:    General: Skin is warm.     Capillary Refill: Capillary refill takes less than 2 seconds.  Neurological:     General: No focal deficit present.     Sensory: No sensory deficit.     Motor: No weakness.      ED Treatments / Results  Labs (all labs ordered are listed, but only abnormal results are displayed) Labs Reviewed  CBC WITH DIFFERENTIAL/PLATELET - Abnormal; Notable for the following components:      Result Value    Hemoglobin 10.5 (*)    MCH 25.9 (*)    MCHC 28.9 (*)    RDW 18.6 (*)    Platelets 548 (*)    Neutro Abs 8.1 (*)    Lymphs Abs 0.5 (*)    Abs Immature Granulocytes 0.16 (*)    All other components within normal limits  COMPREHENSIVE METABOLIC PANEL - Abnormal; Notable for the following components:   Albumin 2.4 (*)    AST 49 (*)    Alkaline Phosphatase 145 (*)    All other components within normal limits  CULTURE, BLOOD (ROUTINE X 2)  CULTURE, BLOOD (ROUTINE X 2)  URINE CULTURE  SARS CORONAVIRUS 2 (HOSPITAL ORDER, [facility])  LACTIC ACID, PLASMA  APTT  PROTIME-INR  URINALYSIS, ROUTINE W REFLEX MICROSCOPIC  POC URINE PREG, ED    EKG None  Radiology Dg Chest Portable 1 View  Result Date: 04/03/2019 CLINICAL DATA:  43 year old female with increasing shortness of breath and cough. Stage IV breast cancer. EXAM: PORTABLE CHEST 1 VIEW COMPARISON:  03/06/2019 FINDINGS: Complete opacification of LEFT hemithorax is noted. A RIGHT Port-A-Cath is present with tip overlying the RIGHT atrium. Decreased airspace opacities within the RIGHT lung noted with RIGHT basilar atelectasis/consolidation. There is no evidence of pneumothorax. No acute bony abnormalities are identified. A sclerotic lesion within the distal RIGHT clavicle is again noted. IMPRESSION: Complete opacification of the LEFT hemithorax which may represent a combination of airspace disease/consolidation/effusion. Increasing RIGHT lung airspace disease and RIGHT basilar atelectasis/consolidation. Electronically Signed   By: Margarette Canada M.D.   On: 04/02/2019 21:39    Procedures Procedures (including critical care time)  Medications Ordered in ED Medications - No data to display   Initial Impression / Assessment and Plan / ED Course  I have reviewed the triage vital signs and the  nursing notes.  Pertinent labs & imaging results that were available during my care of the patient were reviewed by me and  considered in my medical decision making (see chart for details).       CRITICAL CARE Performed by: Taje Littler Total critical care time: 30 minutes Critical care time was exclusive of separately billable procedures and treating other patients. Critical care was necessary to treat or prevent imminent or life-threatening deterioration. Critical care was time spent personally by me on the following activities: development of treatment plan with patient and/or surrogate as well as nursing, discussions with consultants, evaluation of patient's response to treatment, examination of patient, obtaining history from patient or surrogate, ordering and performing treatments and interventions, ordering and review of laboratory studies, ordering and review of radiographic studies, pulse oximetry and re-evaluation of patient's condition.     Pt with stage IV breast CA with mets to brain and lungs.  Low grade fever on arrival, tachycardic and tachypneic on arrival.  Initially, concerned for PNA, so code sepsis initiated.  Rocephin and zithromax given.  Coughing improved after albuterol MDI.  After review of labs, consolidation seen on xray felt more likely to be related to effusion.  She continues to be tachycardic and given her hx, I have ordered CTA for concern of PE.    0020  Discussed pt findings with Dr. Rolland Porter who agrees to follow up on CT results and arrange dispo  Final Clinical Impressions(s) / ED Diagnoses   Final diagnoses:  None    ED Discharge Orders    None       Kem Parkinson, PA-C 04/09/19 0032    Rolland Porter, MD 04/09/19 [phone]

## 2019-04-08 NOTE — ED Notes (Signed)
Pt reports having chemo currently, tapping drain for ? thoracentesis  "I'm getting 500 cc's sometimes" Has had increasing shortness of breath  Worse tonight   Coughing up White/tan expectorant that is thick   orthopneic and tachypnic; tripod posturing

## 2019-04-08 NOTE — ED Notes (Signed)
Pt has adolescent child and she and son live with parents

## 2019-04-08 NOTE — ED Triage Notes (Signed)
Stage 4 breast Cancer Followed at [facility]  On home O2 3L usually  Has had increasing shortness of breath   Tonight felt overwhelming  Cough with yellow white expectorant

## 2019-04-08 NOTE — ED Notes (Signed)
Father elects to stay in car - wife states he becomes uncomfortable with daughter in hospital   Mother states she is leaving to take father home

## 2019-04-08 NOTE — ED Notes (Signed)
Difficulty locating port due to ?? Met sites

## 2019-04-08 NOTE — ED Notes (Signed)
Unable to access the power port due to ? Turn in insertion site   IVs x two started after this RN, Caity, RN and Luis Abed tried to ascertain site points and could not   Labs and cultures drawn

## 2019-04-08 NOTE — ED Notes (Signed)
Mother at bedside   Due to pt illness, father asked to come to bedside should he wish

## 2019-04-08 NOTE — ED Notes (Signed)
Rad to room 

## 2019-04-08 NOTE — ED Notes (Signed)
Pt mother to room

## 2019-04-08 NOTE — ED Notes (Signed)
Walked pt mother to her car- spoke with father   They will go home for now  Telephone number [phone]  Home                                    [phone]    Cell

## 2019-04-08 NOTE — ED Notes (Signed)
TT in to asssess

## 2019-04-09 ENCOUNTER — Encounter (HOSPITAL_COMMUNITY): Payer: Self-pay

## 2019-04-09 ENCOUNTER — Inpatient Hospital Stay (HOSPITAL_COMMUNITY): Payer: BC Managed Care – PPO | Admitting: Anesthesiology

## 2019-04-09 ENCOUNTER — Encounter: Payer: Self-pay | Admitting: *Deleted

## 2019-04-09 ENCOUNTER — Emergency Department (HOSPITAL_COMMUNITY): Payer: BC Managed Care – PPO

## 2019-04-09 ENCOUNTER — Inpatient Hospital Stay (HOSPITAL_COMMUNITY): Payer: BC Managed Care – PPO

## 2019-04-09 ENCOUNTER — Other Ambulatory Visit: Payer: Self-pay

## 2019-04-09 DIAGNOSIS — C787 Secondary malignant neoplasm of liver and intrahepatic bile duct: Secondary | ICD-10-CM | POA: Diagnosis present

## 2019-04-09 DIAGNOSIS — J189 Pneumonia, unspecified organism: Secondary | ICD-10-CM

## 2019-04-09 DIAGNOSIS — Z6841 Body Mass Index (BMI) 40.0 and over, adult: Secondary | ICD-10-CM | POA: Diagnosis not present

## 2019-04-09 DIAGNOSIS — J9602 Acute respiratory failure with hypercapnia: Secondary | ICD-10-CM | POA: Diagnosis not present

## 2019-04-09 DIAGNOSIS — C7931 Secondary malignant neoplasm of brain: Secondary | ICD-10-CM | POA: Diagnosis present

## 2019-04-09 DIAGNOSIS — Z803 Family history of malignant neoplasm of breast: Secondary | ICD-10-CM | POA: Diagnosis not present

## 2019-04-09 DIAGNOSIS — C7801 Secondary malignant neoplasm of right lung: Secondary | ICD-10-CM | POA: Diagnosis present

## 2019-04-09 DIAGNOSIS — Z66 Do not resuscitate: Secondary | ICD-10-CM | POA: Diagnosis not present

## 2019-04-09 DIAGNOSIS — Y95 Nosocomial condition: Secondary | ICD-10-CM | POA: Diagnosis present

## 2019-04-09 DIAGNOSIS — Z8261 Family history of arthritis: Secondary | ICD-10-CM | POA: Diagnosis not present

## 2019-04-09 DIAGNOSIS — E46 Unspecified protein-calorie malnutrition: Secondary | ICD-10-CM | POA: Diagnosis present

## 2019-04-09 DIAGNOSIS — J9621 Acute and chronic respiratory failure with hypoxia: Secondary | ICD-10-CM | POA: Diagnosis present

## 2019-04-09 DIAGNOSIS — M1990 Unspecified osteoarthritis, unspecified site: Secondary | ICD-10-CM | POA: Diagnosis present

## 2019-04-09 DIAGNOSIS — Z801 Family history of malignant neoplasm of trachea, bronchus and lung: Secondary | ICD-10-CM | POA: Diagnosis not present

## 2019-04-09 DIAGNOSIS — Z8249 Family history of ischemic heart disease and other diseases of the circulatory system: Secondary | ICD-10-CM | POA: Diagnosis not present

## 2019-04-09 DIAGNOSIS — A419 Sepsis, unspecified organism: Principal | ICD-10-CM

## 2019-04-09 DIAGNOSIS — C7951 Secondary malignant neoplasm of bone: Secondary | ICD-10-CM | POA: Diagnosis present

## 2019-04-09 DIAGNOSIS — R6520 Severe sepsis without septic shock: Secondary | ICD-10-CM | POA: Diagnosis present

## 2019-04-09 DIAGNOSIS — Z825 Family history of asthma and other chronic lower respiratory diseases: Secondary | ICD-10-CM | POA: Diagnosis not present

## 2019-04-09 DIAGNOSIS — Z515 Encounter for palliative care: Secondary | ICD-10-CM | POA: Diagnosis not present

## 2019-04-09 DIAGNOSIS — Z20828 Contact with and (suspected) exposure to other viral communicable diseases: Secondary | ICD-10-CM | POA: Diagnosis present

## 2019-04-09 DIAGNOSIS — C50919 Malignant neoplasm of unspecified site of unspecified female breast: Secondary | ICD-10-CM | POA: Diagnosis not present

## 2019-04-09 DIAGNOSIS — J910 Malignant pleural effusion: Secondary | ICD-10-CM | POA: Diagnosis present

## 2019-04-09 DIAGNOSIS — Z833 Family history of diabetes mellitus: Secondary | ICD-10-CM | POA: Diagnosis not present

## 2019-04-09 DIAGNOSIS — Z800 Family history of malignant neoplasm of digestive organs: Secondary | ICD-10-CM | POA: Diagnosis not present

## 2019-04-09 DIAGNOSIS — C50912 Malignant neoplasm of unspecified site of left female breast: Secondary | ICD-10-CM | POA: Diagnosis present

## 2019-04-09 DIAGNOSIS — J9601 Acute respiratory failure with hypoxia: Secondary | ICD-10-CM | POA: Diagnosis not present

## 2019-04-09 DIAGNOSIS — Z7189 Other specified counseling: Secondary | ICD-10-CM | POA: Diagnosis not present

## 2019-04-09 DIAGNOSIS — Z87891 Personal history of nicotine dependence: Secondary | ICD-10-CM | POA: Diagnosis not present

## 2019-04-09 DIAGNOSIS — J9620 Acute and chronic respiratory failure, unspecified whether with hypoxia or hypercapnia: Secondary | ICD-10-CM | POA: Diagnosis not present

## 2019-04-09 LAB — COMPREHENSIVE METABOLIC PANEL
ALT: 25 U/L (ref 0–44)
AST: 46 U/L — ABNORMAL HIGH (ref 15–41)
Albumin: 2.3 g/dL — ABNORMAL LOW (ref 3.5–5.0)
Alkaline Phosphatase: 142 U/L — ABNORMAL HIGH (ref 38–126)
Anion gap: 11 (ref 5–15)
BUN: 13 mg/dL (ref 6–20)
CO2: 28 mmol/L (ref 22–32)
Calcium: 9 mg/dL (ref 8.9–10.3)
Chloride: 100 mmol/L (ref 98–111)
Creatinine, Ser: 0.86 mg/dL (ref 0.44–1.00)
GFR calc Af Amer: >60 mL/min (ref >60)
GFR calc non Af Amer: >60 mL/min (ref >60)
Glucose, Bld: 99 mg/dL (ref 70–99)
Potassium: 4 mmol/L (ref 3.5–5.1)
Sodium: 139 mmol/L (ref 135–145)
Total Bilirubin: 0.2 mg/dL — ABNORMAL LOW (ref 0.3–1.2)
Total Protein: 7 g/dL (ref 6.5–8.1)

## 2019-04-09 LAB — CORTISOL-AM, BLOOD: Cortisol - AM: 36.4 ug/dL — ABNORMAL HIGH (ref 6.7–22.6)

## 2019-04-09 LAB — BLOOD GAS, ARTERIAL
Acid-Base Excess: 3.8 mmol/L — ABNORMAL HIGH (ref 0.0–2.0)
Bicarbonate: 27.5 mmol/L (ref 20.0–28.0)
FIO2: 100
O2 Saturation: 99.6 %
Patient temperature: 37.7
pCO2 arterial: 47.9 mmHg (ref 32.0–48.0)
pH, Arterial: 7.39 (ref 7.350–7.450)
pO2, Arterial: 339 mmHg — ABNORMAL HIGH (ref 83.0–108.0)

## 2019-04-09 LAB — PROCALCITONIN: Procalcitonin: 0.1 ng/mL

## 2019-04-09 LAB — MRSA PCR SCREENING: MRSA by PCR: POSITIVE — AB

## 2019-04-09 LAB — URINALYSIS, ROUTINE W REFLEX MICROSCOPIC
Bacteria, UA: NONE SEEN
Bilirubin Urine: NEGATIVE
Glucose, UA: NEGATIVE mg/dL
Hgb urine dipstick: NEGATIVE
Ketones, ur: NEGATIVE mg/dL
Leukocytes,Ua: NEGATIVE
Nitrite: NEGATIVE
Protein, ur: 30 mg/dL — AB
Specific Gravity, Urine: >1.046 — ABNORMAL HIGH (ref 1.005–1.030)
pH: 5 (ref 5.0–8.0)

## 2019-04-09 LAB — GLUCOSE, CAPILLARY
Glucose-Capillary: 111 mg/dL — ABNORMAL HIGH (ref 70–99)
Glucose-Capillary: 104 mg/dL — ABNORMAL HIGH (ref 70–99)
Glucose-Capillary: 119 mg/dL — ABNORMAL HIGH (ref 70–99)
Glucose-Capillary: 104 mg/dL — ABNORMAL HIGH (ref 70–99)

## 2019-04-09 LAB — PREGNANCY, URINE: Preg Test, Ur: NEGATIVE

## 2019-04-09 LAB — PROTIME-INR
INR: 1.1 (ref 0.8–1.2)
Prothrombin Time: 13.9 seconds (ref 11.4–15.2)

## 2019-04-09 LAB — SARS CORONAVIRUS 2 BY RT PCR (HOSPITAL ORDER, PERFORMED IN ~~LOC~~ HOSPITAL LAB): SARS Coronavirus 2: NEGATIVE

## 2019-04-09 LAB — MAGNESIUM: Magnesium: 2.2 mg/dL (ref 1.7–2.4)

## 2019-04-09 MED ORDER — PROPOFOL 1000 MG/100ML IV EMUL
0.0000 ug/kg/min | INTRAVENOUS | Status: DC
  Administered 2019-04-09: 22:00:00 40 ug/kg/min via INTRAVENOUS
  Administered 2019-04-09: 50 ug/kg/min via INTRAVENOUS
  Administered 2019-04-09: 40 ug/kg/min via INTRAVENOUS
  Administered 2019-04-09: 08:00:00 25 mg via INTRAVENOUS
  Administered 2019-04-09: 08:00:00 5 ug/kg/min via INTRAVENOUS
  Administered 2019-04-09: 18:00:00 30 ug/kg/min via INTRAVENOUS
  Administered 2019-04-10 (×2): 35 ug/kg/min via INTRAVENOUS
  Administered 2019-04-10: 30 ug/kg/min via INTRAVENOUS
  Administered 2019-04-10: 35 ug/kg/min via INTRAVENOUS
  Administered 2019-04-10: 40 ug/kg/min via INTRAVENOUS
  Administered 2019-04-10: 30 ug/kg/min via INTRAVENOUS
  Administered 2019-04-11: 40 ug/kg/min via INTRAVENOUS
  Administered 2019-04-11 (×2): 45 ug/kg/min via INTRAVENOUS
  Administered 2019-04-11: 21:00:00 50 ug/kg/min via INTRAVENOUS
  Administered 2019-04-11: 45 ug/kg/min via INTRAVENOUS
  Administered 2019-04-11: 50 ug/kg/min via INTRAVENOUS
  Administered 2019-04-11: 40 ug/kg/min via INTRAVENOUS
  Administered 2019-04-12 (×2): 50 ug/kg/min via INTRAVENOUS
  Filled 2019-04-09 (×2): qty 100
  Filled 2019-04-09: qty 300
  Filled 2019-04-09 (×10): qty 100
  Filled 2019-04-09: qty 200
  Filled 2019-04-09 (×2): qty 100

## 2019-04-09 MED ORDER — HYDROCODONE-HOMATROPINE 5-1.5 MG/5ML PO SYRP: 5.0000 mL | ORAL_SOLUTION | Freq: Four times a day (QID) | ORAL | Status: DC | PRN

## 2019-04-09 MED ORDER — PIPERACILLIN-TAZOBACTAM 3.375 G IVPB
3.3750 g | Freq: Once | INTRAVENOUS | Status: AC
  Administered 2019-04-09: 3.375 g via INTRAVENOUS
  Filled 2019-04-09: qty 50

## 2019-04-09 MED ORDER — GABAPENTIN 300 MG PO CAPS
900.0000 mg | ORAL_CAPSULE | Freq: Two times a day (BID) | ORAL | Status: DC
  Administered 2019-04-09: 900 mg
  Filled 2019-04-09: qty 3

## 2019-04-09 MED ORDER — MORPHINE SULFATE (PF) 2 MG/ML IV SOLN: 2.0000 mg | INTRAVENOUS | Status: DC | PRN

## 2019-04-09 MED ORDER — SODIUM CHLORIDE 0.9 % IV SOLN: 2.0000 g | INTRAVENOUS | Status: DC

## 2019-04-09 MED ORDER — ONDANSETRON HCL 4 MG/2ML IJ SOLN: 4.0000 mg | Freq: Four times a day (QID) | INTRAMUSCULAR | Status: DC | PRN

## 2019-04-09 MED ORDER — IPRATROPIUM-ALBUTEROL 0.5-2.5 (3) MG/3ML IN SOLN: 3.0000 mL | RESPIRATORY_TRACT | Status: DC | PRN

## 2019-04-09 MED ORDER — SODIUM CHLORIDE 0.9 % IV BOLUS
1000.0000 mL | Freq: Once | INTRAVENOUS | Status: AC
  Administered 2019-04-09: 1000 mL via INTRAVENOUS

## 2019-04-09 MED ORDER — ACETAMINOPHEN 650 MG RE SUPP: 650.0000 mg | Freq: Four times a day (QID) | RECTAL | Status: DC | PRN

## 2019-04-09 MED ORDER — MIDAZOLAM HCL 2 MG/2ML IJ SOLN
INTRAMUSCULAR | Status: AC
  Administered 2019-04-09: 2 mg
  Filled 2019-04-09: qty 2

## 2019-04-09 MED ORDER — GUAIFENESIN ER 600 MG PO TB12: 600.0000 mg | ORAL_TABLET | Freq: Two times a day (BID) | ORAL | Status: DC

## 2019-04-09 MED ORDER — CHLORHEXIDINE GLUCONATE CLOTH 2 % EX PADS
6.0000 | MEDICATED_PAD | Freq: Every day | CUTANEOUS | Status: DC
  Administered 2019-04-09 – 2019-04-12 (×4): 6 via TOPICAL

## 2019-04-09 MED ORDER — BISACODYL 10 MG RE SUPP: 10.0000 mg | Freq: Every day | RECTAL | Status: DC | PRN

## 2019-04-09 MED ORDER — SODIUM CHLORIDE 0.9 % IV SOLN: 500.0000 mg | INTRAVENOUS | Status: DC

## 2019-04-09 MED ORDER — ACETAMINOPHEN 325 MG PO TABS: 650.0000 mg | ORAL_TABLET | Freq: Four times a day (QID) | ORAL | Status: DC | PRN

## 2019-04-09 MED ORDER — LORAZEPAM 2 MG/ML IJ SOLN
1.0000 mg | Freq: Once | INTRAMUSCULAR | Status: AC
  Administered 2019-04-09: 1 mg via INTRAVENOUS
  Filled 2019-04-09: qty 1

## 2019-04-09 MED ORDER — MORPHINE SULFATE (PF) 2 MG/ML IV SOLN
2.0000 mg | Freq: Once | INTRAVENOUS | Status: AC
  Administered 2019-04-09: 2 mg via INTRAVENOUS
  Filled 2019-04-09: qty 1

## 2019-04-09 MED ORDER — MIDAZOLAM HCL 2 MG/2ML IJ SOLN
2.0000 mg | INTRAMUSCULAR | Status: DC | PRN
  Administered 2019-04-09 – 2019-04-12 (×8): 2 mg via INTRAVENOUS
  Filled 2019-04-09 (×8): qty 2

## 2019-04-09 MED ORDER — FENTANYL 50 MCG/HR TD PT72
1.0000 | MEDICATED_PATCH | TRANSDERMAL | Status: DC
  Filled 2019-04-09: qty 1

## 2019-04-09 MED ORDER — PIPERACILLIN-TAZOBACTAM 3.375 G IVPB
3.3750 g | Freq: Three times a day (TID) | INTRAVENOUS | Status: DC
  Administered 2019-04-09 – 2019-04-12 (×9): 3.375 g via INTRAVENOUS
  Filled 2019-04-09 (×9): qty 50

## 2019-04-09 MED ORDER — LORAZEPAM 2 MG/ML IJ SOLN
0.5000 mg | INTRAMUSCULAR | Status: AC | PRN
  Administered 2019-04-09: 0.5 mg via INTRAVENOUS
  Filled 2019-04-09: qty 1

## 2019-04-09 MED ORDER — GABAPENTIN 250 MG/5ML PO SOLN
900.0000 mg | Freq: Two times a day (BID) | ORAL | Status: DC
  Administered 2019-04-09 – 2019-04-11 (×5): 900 mg
  Filled 2019-04-09 (×11): qty 18

## 2019-04-09 MED ORDER — GABAPENTIN 300 MG PO CAPS: 900.0000 mg | ORAL_CAPSULE | Freq: Two times a day (BID) | ORAL | Status: DC

## 2019-04-09 MED ORDER — SODIUM CHLORIDE 0.9 % IV SOLN
INTRAVENOUS | Status: DC
  Administered 2019-04-09 – 2019-04-11 (×7): via INTRAVENOUS

## 2019-04-09 MED ORDER — METOPROLOL TARTRATE 25 MG PO TABS: 12.5000 mg | ORAL_TABLET | Freq: Two times a day (BID) | ORAL | Status: DC

## 2019-04-09 MED ORDER — ONDANSETRON HCL 4 MG PO TABS: 4.0000 mg | ORAL_TABLET | Freq: Four times a day (QID) | ORAL | Status: DC | PRN

## 2019-04-09 MED ORDER — SUCCINYLCHOLINE CHLORIDE 20 MG/ML IJ SOLN
INTRAMUSCULAR | Status: DC | PRN
  Administered 2019-04-09: 140 mg via INTRAVENOUS

## 2019-04-09 MED ORDER — ENOXAPARIN SODIUM 60 MG/0.6ML ~~LOC~~ SOLN
60.0000 mg | SUBCUTANEOUS | Status: DC
  Administered 2019-04-09 – 2019-04-11 (×3): 60 mg via SUBCUTANEOUS
  Filled 2019-04-09 (×3): qty 0.6

## 2019-04-09 MED ORDER — METHYLPREDNISOLONE SODIUM SUCC 40 MG IJ SOLR
40.0000 mg | Freq: Two times a day (BID) | INTRAMUSCULAR | Status: DC
  Administered 2019-04-09 – 2019-04-12 (×7): 40 mg via INTRAVENOUS
  Filled 2019-04-09 (×7): qty 1

## 2019-04-09 MED ORDER — FENTANYL CITRATE (PF) 100 MCG/2ML IJ SOLN
50.0000 ug | INTRAMUSCULAR | Status: DC | PRN
  Administered 2019-04-09: 50 ug via INTRAVENOUS
  Filled 2019-04-09: qty 2

## 2019-04-09 MED ORDER — VANCOMYCIN HCL IN DEXTROSE 1-5 GM/200ML-% IV SOLN
1000.0000 mg | Freq: Once | INTRAVENOUS | Status: AC
  Administered 2019-04-09: 1000 mg via INTRAVENOUS
  Filled 2019-04-09: qty 200

## 2019-04-09 MED ORDER — PROCHLORPERAZINE MALEATE 5 MG PO TABS: 10.0000 mg | ORAL_TABLET | Freq: Four times a day (QID) | ORAL | Status: DC | PRN

## 2019-04-09 MED ORDER — PROPOFOL 1000 MG/100ML IV EMUL
INTRAVENOUS | Status: AC
  Administered 2019-04-09: 5 ug/kg/min via INTRAVENOUS
  Filled 2019-04-09: qty 100

## 2019-04-09 MED ORDER — PANTOPRAZOLE SODIUM 40 MG IV SOLR
40.0000 mg | INTRAVENOUS | Status: DC
  Administered 2019-04-09 – 2019-04-11 (×3): 40 mg via INTRAVENOUS
  Filled 2019-04-09 (×3): qty 40

## 2019-04-09 MED ORDER — ORAL CARE MOUTH RINSE: 15.0000 mL | Freq: Two times a day (BID) | OROMUCOSAL | Status: DC

## 2019-04-09 MED ORDER — MUPIROCIN 2 % EX OINT
TOPICAL_OINTMENT | Freq: Two times a day (BID) | CUTANEOUS | Status: DC
  Administered 2019-04-09: 1 via NASAL
  Administered 2019-04-09 – 2019-04-12 (×6): via NASAL
  Filled 2019-04-09: qty 22

## 2019-04-09 MED ORDER — FENTANYL 2500MCG IN NS 250ML (10MCG/ML) PREMIX INFUSION
0.0000 ug/h | INTRAVENOUS | Status: DC
  Administered 2019-04-09: 25 ug/h via INTRAVENOUS
  Administered 2019-04-10: 225 ug/h via INTRAVENOUS
  Administered 2019-04-10: 175 ug/h via INTRAVENOUS
  Administered 2019-04-11: 350 ug/h via INTRAVENOUS
  Administered 2019-04-11: 15:00:00 325 ug/h via INTRAVENOUS
  Administered 2019-04-11 (×2): 300 ug/h via INTRAVENOUS
  Administered 2019-04-12: 350 ug/h via INTRAVENOUS
  Filled 2019-04-09 (×8): qty 250

## 2019-04-09 MED ORDER — VANCOMYCIN HCL IN DEXTROSE 1-5 GM/200ML-% IV SOLN
1000.0000 mg | Freq: Three times a day (TID) | INTRAVENOUS | Status: DC
  Administered 2019-04-09 – 2019-04-12 (×8): 1000 mg via INTRAVENOUS
  Filled 2019-04-09 (×8): qty 200

## 2019-04-09 MED ORDER — POLYETHYLENE GLYCOL 3350 17 G PO PACK: 17.0000 g | PACK | Freq: Every day | ORAL | Status: DC | PRN

## 2019-04-09 MED ORDER — ORAL CARE MOUTH RINSE
15.0000 mL | OROMUCOSAL | Status: DC
  Administered 2019-04-09 – 2019-04-12 (×31): 15 mL via OROMUCOSAL

## 2019-04-09 MED ORDER — FENTANYL CITRATE (PF) 100 MCG/2ML IJ SOLN
50.0000 ug | INTRAMUSCULAR | Status: DC | PRN
  Administered 2019-04-09 – 2019-04-10 (×3): 100 ug via INTRAVENOUS
  Filled 2019-04-09: qty 4
  Filled 2019-04-09 (×2): qty 2

## 2019-04-09 MED ORDER — CYCLOBENZAPRINE HCL 10 MG PO TABS: 10.0000 mg | ORAL_TABLET | Freq: Three times a day (TID) | ORAL | Status: DC | PRN

## 2019-04-09 MED ORDER — METOPROLOL TARTRATE 5 MG/5ML IV SOLN
5.0000 mg | Freq: Three times a day (TID) | INTRAVENOUS | Status: DC
  Administered 2019-04-09 – 2019-04-12 (×10): 5 mg via INTRAVENOUS
  Filled 2019-04-09 (×10): qty 5

## 2019-04-09 MED ORDER — FENTANYL CITRATE (PF) 250 MCG/5ML IJ SOLN: 0.5000 ug/kg/h | INTRAVENOUS | Status: DC

## 2019-04-09 MED ORDER — OXYCODONE HCL 5 MG PO TABS: 10.0000 mg | ORAL_TABLET | ORAL | Status: DC | PRN

## 2019-04-09 MED ORDER — CHLORHEXIDINE GLUCONATE 0.12% ORAL RINSE (MEDLINE KIT)
15.0000 mL | Freq: Two times a day (BID) | OROMUCOSAL | Status: DC
  Administered 2019-04-09 – 2019-04-12 (×7): 15 mL via OROMUCOSAL

## 2019-04-09 NOTE — Progress Notes (Deleted)
[place] OFFICE PROGRESS NOTE  Patient Care Team: Rakes, Connye Burkitt, FNP as PCP - General (Family Medicine) Tish Men, MD as Medical Oncologist (Hematology) Cordelia Poche, RN as Oncology Nurse Navigator  HEME/ONC OVERVIEW: ***  ASSESSMENT & PLAN:  ***  No orders of the defined types were placed in this encounter.   All questions were answered. The patient knows to call the clinic with any problems, questions or concerns. No barriers to learning was detected.  A total of more than {CHL ONC TIME VISIT - HDQQI:[id number]} were spent face-to-face with the patient during this encounter and over half of that time was spent on counseling and coordination of care as outlined above.   Tish Men, MD 04/09/2019 11:57 AM  CHIEF COMPLAINT: "I am here for *** "  INTERVAL HISTORY:   SUMMARY OF ONCOLOGIC HISTORY: Oncology History  Metastatic breast cancer ([place])  09/30/2018 Imaging   CT chest with contrast (at [facility]): Impressions: 1.  Dermal thickening of the multiple masses within the left breast as well as a invasive 6 cm mass of the left pectoralis major, compatible with primary breast cancer. 2.  Numerous pulmonary metastases.  Left axillary, retropectoral, supraclavicular, and upper mediastinal metastatic lymphadenopathy.  Mildly enlarged right axillary and supraclavicular lymph nodes may be metastatic. 3.  Indeterminant subcentimeter lucency in the dome of the right liver. 4.  Small left pleural effusion.   10/10/2018 Initial Diagnosis   Breast cancer ([place])   10/17/2018 Imaging   CTA chest: IMPRESSION: 1. No evidence of lobar or more central pulmonary embolus. Nondiagnostic segmental assessment. 2. Moderate left pleural effusion, increased in size from the prior CT with increasing left lung atelectasis. 3. Unchanged appearance of multiple left breast masses including an invasive mass involving the pectoralis major. 4. Unchanged left axillary, left  subpectoral, and mediastinal lymphadenopathy. 5. Mild interval enlargement of multiple lung metastases and of right axillary lymph nodes.   10/18/2018 Imaging   MRI brain w/o and w/ contrast: Single 4 mm focus of enhancement within right frontal white matter probably representing metastatic disease. Attention at follow-up is recommended.   10/19/2018 Imaging   CT abdomen/pelvis w/ contrast: IMPRESSION: 1. No CT evidence of metastatic disease involving the abdomen or pelvis. 2. Left pleural effusion associated atelectasis consolidation with numerous pulmonary masses and nodules on partially imaged. Skin thickening of the left breast. Findings are in keeping with advanced breast malignancy and as seen on recent CT of the chest, 10/17/2018.   10/19/2018 Procedure   US-guided bx of left supraclavicular LN    10/19/2018 Pathology Results   Accession: [id number]  Lymph node, needle/core biopsy, left supraclavicular - METASTATIC CARCINOMA   11/09/2018 - 02/14/2019 Chemotherapy   The patient had pegfilgrastim-cbqv (UDENYCA) injection 6 mg, 6 mg, Subcutaneous, Once, 1 of 1 cycle Administration: 6 mg (11/10/2018) trastuzumab (HERCEPTIN) 900 mg in sodium chloride 0.9 % 250 mL chemo infusion, 966 mg, Intravenous,  Once, 4 of 7 cycles Administration: 900 mg (11/09/2018), 600 mg (12/14/2018), 600 mg (01/04/2019), 600 mg (01/25/2019) DOCEtaxel (TAXOTERE) 170 mg in sodium chloride 0.9 % 250 mL chemo infusion, 75 mg/m2 = 170 mg, Intravenous,  Once, 4 of 7 cycles Dose modification: 60 mg/m2 (original dose 75 mg/m2, Cycle 2, Reason: Dose not tolerated) Administration: 170 mg (11/09/2018), 140 mg (12/14/2018), 140 mg (01/04/2019), 140 mg (01/25/2019) pertuzumab (PERJETA) 840 mg in sodium chloride 0.9 % 250 mL chemo infusion, 840 mg, Intravenous, Once, 4 of 7 cycles Administration: 840 mg (11/09/2018), 420 mg (  12/14/2018), 420 mg (01/04/2019), 420 mg (01/25/2019)  for chemotherapy treatment.    02/06/2019 Imaging   CT  CAP: IMPRESSION: 1. There is mixed response to treatment, with decreased size of primary left breast mass and pulmonary nodules, however with enlarging lymphadenopathy and new, extensive sclerotic osseous metastatic disease.   2. Primary mass of the posterior left breast, involving the pectoralis major, has significantly decreased in size compared to prior examination, now measuring approximately 3.0 cm, previously at least 6.0 cm (series 2, image 14). There is unchanged, severe skin thickening of the left breast.   3. Numerous bilateral pulmonary masses and nodules have generally decreased in size, an index nodule of the right lower lobe measuring 2.0 cm, previously 3.1 cm on measured similarly (series 3, image 66). There has been interval increase in volume of bilateral pleural effusions, with extensive consolidation and volume loss of the left lung, with extensive nodular pleural disease. 4. However, bulky axillary lymph nodes have increased in size compared to prior examination, the largest in the right axilla 4.3 x 2.7 cm, previously 3.3 x 1.5 cm when measured similarly (series 2, image 11). Interval enlargement of supraclavicular and mediastinal lymph nodes, the largest supraclavicular nodes on the left measuring 3.0 x 1.5 cm, previously 2.4 x 1.1 cm (series 2, image 5). Newly enlarged epicardial lymph nodes measuring up to 1.4 x 1.0 cm (series 2, image 37). There are newly enlarged left retroperitoneal lymph nodes measuring up to 1.4 x 1.0 cm (series 2, image 58).   5. There are innumerable new sclerotic osseous metastatic lesions involving the included axial and appendicular skeleton. In retrospect, some of these lesions were likely subtly present on prior CT dated 10/19/2018 and to some extent this change reflects post treatment appearance of metastatic lesions.   02/15/2019 - 04/04/2019 Chemotherapy   The patient had ado-trastuzumab emtansine (KADCYLA) 320 mg in sodium  chloride 0.9 % 250 mL chemo infusion, 360 mg, Intravenous, Once, 2 of 5 cycles Administration: 320 mg (02/15/2019), 360 mg (03/15/2019)  for chemotherapy treatment.    04/10/2019 -  Chemotherapy   The patient had palonosetron (ALOXI) injection 0.25 mg, 0.25 mg, Intravenous,  Once, 0 of 6 cycles pegfilgrastim-jmdb (FULPHILA) injection 6 mg, 6 mg, Subcutaneous,  Once, 0 of 6 cycles CARBOplatin (PARAPLATIN) 750 mg in sodium chloride 0.9 % 250 mL chemo infusion, 750 mg (100 % of original dose 750 mg), Intravenous,  Once, 0 of 6 cycles Dose modification: 750 mg (original dose 750 mg, Cycle 1) PACLitaxel (TAXOL) 372 mg in sodium chloride 0.9 % 500 mL chemo infusion (> 73m/m2), 175 mg/m2 = 372 mg, Intravenous,  Once, 0 of 6 cycles fosaprepitant (EMEND) 150 mg, dexamethasone (DECADRON) 12 mg in sodium chloride 0.9 % 145 mL IVPB, , Intravenous,  Once, 0 of 6 cycles trastuzumab-dkst (OGIVRI) 798 mg in sodium chloride 0.9 % 250 mL chemo infusion, 8 mg/kg = 798 mg, Intravenous,  Once, 0 of 6 cycles  for chemotherapy treatment.      REVIEW OF SYSTEMS:   Constitutional: ( - ) fevers, ( - )  chills , ( - ) night sweats Eyes: ( - ) blurriness of vision, ( - ) double vision, ( - ) watery eyes Ears, nose, mouth, throat, and face: ( - ) mucositis, ( - ) sore throat Respiratory: ( - ) cough, ( - ) dyspnea, ( - ) wheezes Cardiovascular: ( - ) palpitation, ( - ) chest discomfort, ( - ) lower extremity swelling Gastrointestinal:  ( - )  nausea, ( - ) heartburn, ( - ) change in bowel habits Skin: ( - ) abnormal skin rashes Lymphatics: ( - ) new lymphadenopathy, ( - ) easy bruising Neurological: ( - ) numbness, ( - ) tingling, ( - ) new weaknesses Behavioral/Psych: ( - ) mood change, ( - ) new changes  All other systems were reviewed with the patient and are negative.  I have reviewed the past medical history, past surgical history, social history and family history with the patient and they are unchanged from  previous note.  ALLERGIES:  is allergic to aspirin.  MEDICATIONS:  No current facility-administered medications for this visit.    No current outpatient medications on file.   Facility-Administered Medications Ordered in Other Visits  Medication Dose Route Frequency Provider Last Rate Last Dose  . 0.9 %  sodium chloride infusion   Intravenous Continuous Zierle-Ghosh, Asia B, DO 100 mL/hr at 04/09/19 1102    . acetaminophen (TYLENOL) tablet 650 mg  650 mg Oral Q6H PRN Zierle-Ghosh, Asia B, DO       Or  . acetaminophen (TYLENOL) suppository 650 mg  650 mg Rectal Q6H PRN Zierle-Ghosh, Asia B, DO      . bisacodyl (DULCOLAX) suppository 10 mg  10 mg Rectal Daily PRN Zierle-Ghosh, Asia B, DO      . chlorhexidine gluconate (MEDLINE KIT) (PERIDEX) 0.12 % solution 15 mL  15 mL Mouth Rinse BID Manuella Ghazi, Pratik D, DO   15 mL at 04/09/19 0837  . Chlorhexidine Gluconate Cloth 2 % PADS 6 each  6 each Topical Daily Heath Lark D, DO   6 each at 04/09/19 [phone]  . enoxaparin (LOVENOX) injection 60 mg  60 mg Subcutaneous Q24H Zierle-Ghosh, Asia B, DO   60 mg at 04/09/19 0814  . fentaNYL (SUBLIMAZE) injection 50 mcg  50 mcg Intravenous Q15 min PRN Sinda Du, MD   50 mcg at 04/09/19 0858  . fentaNYL (SUBLIMAZE) injection 50-200 mcg  50-200 mcg Intravenous Q30 min PRN Sinda Du, MD   100 mcg at 04/09/19 0755  . fentaNYL 2551mg in NS 2546m(1051mml) infusion-PREMIX  0-400 mcg/hr Intravenous Continuous ShaManuella Ghaziratik D, DO 5 mL/hr at 04/09/19 1102 50 mcg/hr at 04/09/19 1102  . gabapentin (NEURONTIN) 250 MG/5ML solution 900 mg  900 mg Per Tube Q12H Shah, Pratik D, DO      . ipratropium-albuterol (DUONEB) 0.5-2.5 (3) MG/3ML nebulizer solution 3 mL  3 mL Nebulization Q2H PRN Zierle-Ghosh, Asia B, DO      . MEDLINE mouth rinse  15 mL Mouth Rinse 10 times per day ShaManuella Ghaziratik D, DO   15 mL at 04/09/19 1000  . methylPREDNISolone sodium succinate (SOLU-MEDROL) 40 mg/mL injection 40 mg  40 mg Intravenous Q12Emmit PomfretD   40 mg at 04/09/19 0825  . metoprolol tartrate (LOPRESSOR) injection 5 mg  5 mg Intravenous Q8HAlbesa SeenD   5 mg at 04/09/19 0804  . midazolam (VERSED) injection 2 mg  2 mg Intravenous Q1H PRN HawSinda DuD   2 mg at 04/09/19 0926  . mupirocin ointment (BACTROBAN) 2 %   Nasal BID ShaManuella Ghaziratik D, DO      . ondansetron (ZOFRAN) tablet 4 mg  4 mg Oral Q6H PRN Zierle-Ghosh, Asia B, DO       Or  . ondansetron (ZOFRAN) injection 4 mg  4 mg Intravenous Q6H PRN Zierle-Ghosh, Asia B, DO      . pantoprazole (PROTONIX) injection 40 mg  40  mg Intravenous Q24H Manuella Ghazi, Pratik D, DO      . piperacillin-tazobactam (ZOSYN) IVPB 3.375 g  3.375 g Intravenous Q8H Shah, Pratik D, DO      . propofol (DIPRIVAN) 1000 MG/100ML infusion  0-50 mcg/kg/min Intravenous Continuous Sinda Du, MD 32.9 mL/hr at 04/09/19 1102 50 mcg/kg/min at 04/09/19 1102  . vancomycin (VANCOCIN) IVPB 1000 mg/200 mL premix  1,000 mg Intravenous Q8H Shah, Pratik D, DO        PHYSICAL EXAMINATION: ECOG PERFORMANCE STATUS: {CHL ONC ECOG XB:[id number]}  There were no vitals filed for this visit. There is no height or weight on file to calculate BMI.  There were no vitals filed for this visit.  GENERAL: alert, no distress and comfortable SKIN: skin color, texture, turgor are normal, no rashes or significant lesions EYES: conjunctiva are pink and non-injected, sclera clear OROPHARYNX: no exudate, no erythema; lips, buccal mucosa, and tongue normal  NECK: supple, non-tender LYMPH:  no palpable lymphadenopathy in the cervical LUNGS: clear to auscultation with normal breathing effort HEART: regular rate & rhythm and no murmurs and no lower extremity edema ABDOMEN: soft, non-tender, non-distended, normal bowel sounds Musculoskeletal: no cyanosis of digits and no clubbing  PSYCH: alert & oriented x 3, fluent speech NEURO: no focal motor/sensory deficits  LABORATORY DATA:  I have reviewed the data as  listed    Component Value Date/Time   NA 139 04/09/2019 0721   K 4.0 04/09/2019 0721   CL 100 04/09/2019 0721   CO2 28 04/09/2019 0721   GLUCOSE 99 04/09/2019 0721   BUN 13 04/09/2019 0721   CREATININE 0.86 04/09/2019 0721   CREATININE 0.91 03/29/2019 1140   CALCIUM 9.0 04/09/2019 0721   PROT 7.0 04/09/2019 0721   ALBUMIN 2.3 (L) 04/09/2019 0721   AST 46 (H) 04/09/2019 0721   AST 38 03/29/2019 1140   ALT 25 04/09/2019 0721   ALT 21 03/29/2019 1140   ALKPHOS 142 (H) 04/09/2019 0721   BILITOT 0.2 (L) 04/09/2019 0721   BILITOT 0.4 03/29/2019 1140   GFRNONAA >60 04/09/2019 0721   GFRNONAA >60 03/29/2019 1140   GFRAA >60 04/09/2019 0721   GFRAA >60 03/29/2019 1140    No results found for: SPEP, UPEP  Lab Results  Component Value Date   WBC 9.3 04/09/2019   NEUTROABS 8.1 (H) 03/27/2019   HGB 10.2 (L) 04/09/2019   HCT 36.3 04/09/2019   MCV 90.3 04/09/2019   PLT 517 (H) 04/09/2019      Chemistry      Component Value Date/Time   NA 139 04/09/2019 0721   K 4.0 04/09/2019 0721   CL 100 04/09/2019 0721   CO2 28 04/09/2019 0721   BUN 13 04/09/2019 0721   CREATININE 0.86 04/09/2019 0721   CREATININE 0.91 03/29/2019 1140      Component Value Date/Time   CALCIUM 9.0 04/09/2019 0721   ALKPHOS 142 (H) 04/09/2019 0721   AST 46 (H) 04/09/2019 0721   AST 38 03/29/2019 1140   ALT 25 04/09/2019 0721   ALT 21 03/29/2019 1140   BILITOT 0.2 (L) 04/09/2019 0721   BILITOT 0.4 03/29/2019 1140       RADIOGRAPHIC STUDIES: I have personally reviewed the radiological images as listed below and agreed with the findings in the report. Ct Head Wo Contrast  Result Date: 04/09/2019 CLINICAL DATA:  Initial evaluation for acute altered mental status. EXAM: CT HEAD WITHOUT CONTRAST TECHNIQUE: Contiguous axial images were obtained from the base of the skull through the  vertex without intravenous contrast. COMPARISON:  Comparison made with recent brain MRI from 03/30/2019. FINDINGS: Brain:  Cerebral volume stable, and within normal limits for age. Previously question metastatic lesion involving the superior left cerebellum not visible by CT. No other discrete mass lesion, mass effect, or midline shift. No intracranial hemorrhage. No acute large vessel territory infarct. Ventricles normal size without hydrocephalus. No extra-axial fluid collection. Vascular: No hyperdense vessel. 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 Additional 6 mm soft tissue nodule at the right frontal scalp also unchanged. No other new focal osseous lesions. Sinuses/Orbits: Globes orbital soft tissues within normal limits. Mild scattered mucosal thickening within the sphenoid sinuses. Visualized paranasal sinuses are otherwise clear. Mastoid air cells and middle ear cavities are well pneumatized and free of fluid. Other: Malignant adenopathy partially visualized within the left supraclavicular fossa, better seen on recent neck CT. IMPRESSION: 1. No acute intracranial abnormality. 2. 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 3. Previously question left superficial cerebellar lesion not visible by CT. 4. Malignant adenopathy partially visualized within the left supraclavicular fossa, better seen on recent neck CT. Electronically Signed   By: Jeannine Boga M.D.   On: 04/09/2019 01:59   Ct Soft Tissue Neck W Contrast  Result Date: 03/30/2019 CLINICAL DATA:  Metastatic breast cancer EXAM: CT NECK WITH CONTRAST TECHNIQUE: Multidetector CT imaging of the neck was performed using the standard protocol following the bolus administration of intravenous contrast. CONTRAST:  130m OMNIPAQUE IOHEXOL 300 MG/ML  SOLN COMPARISON:  None. FINDINGS: Pharynx and larynx: No evidence of mass or swelling. Salivary glands: Negative Thyroid: Negative Lymph nodes: Malignant adenopathy in the left more than  right supraclavicular fossa, posterior triangle, and bilateral jugular chain reaching level 2. There has been no prior neck imaging for comparison. A right supraclavicular lymph node measuring 16 mm was covered on 03/09/2019 chest CT and is unchanged in size. The most dramatic nodal deposit is in the left supraclavicular fossa/posterior triangle where there is a confluent mass measuring nearly 5 cm and extending into the skin and along the trapezius. Bilateral submandibular lymph nodes are symmetrically enlarged and likely malignant. Vascular: Right IJ catheter in unremarkable position. Retropharyngeal course of the bilateral carotid. Limited intracranial: Separate brain MRI performed the same day. Visualized orbits: Negative for mass. Mastoids and visualized paranasal sinuses: Clear Skeleton: Sclerotic metastases in the C3, C5, C6, C7, and upper thoracic vertebrae. Metastases also seen in the medial right clavicle. No evidence of soft tissue extension or pathologic fracture. Upper chest: Described on dedicated chest CT. Notable bilateral masses at involving the bilateral scalenes and presumably the brachial plexus. IMPRESSION: 1. Baseline neck CT which shows widespread nodal metastatic disease including a confluent ~5 cm left posterior triangle mass which extends to the skin surface and into the trapezius. 2. Bilateral thoracic inlet masses invading scalenes and presumably the brachial plexus. 3. Multifocal sclerotic osseous metastatic disease. 4. Chest reported separately. Electronically Signed   By: JMonte FantasiaM.D.   On: 03/30/2019 15:34   Ct Chest W Contrast  Result Date: 03/29/2019 CLINICAL DATA:  Follow-up metastatic breast cancer. EXAM: CT CHEST, ABDOMEN, AND PELVIS WITH CONTRAST TECHNIQUE: Multidetector CT imaging of the chest, abdomen and pelvis was performed following the standard protocol during bolus administration of intravenous contrast. CONTRAST:  1082mOMNIPAQUE IOHEXOL 300 MG/ML  SOLN  COMPARISON:  02/06/2019 FINDINGS: CT CHEST FINDINGS Cardiovascular: The heart size is normal. Small pericardial  effusion. Mediastinum/Nodes: Normal appearance of the thyroid gland. The trachea appears patent and is midline. Normal appearance of the esophagus. Index right axillary node measures 3.2 cm short axis, image 13/2. Previously 2.5 cm. Index left axillary node measures 1.9 cm short axis. Previously this measured the same. Left supraclavicular lymph node measures 2 cm short axis, image 6/2. Previously 1.7 cm. Soft tissue infiltration throughout the mediastinal fat is identified which likely represents a combination of edema and tumor infiltration. Enlarged right pre-vascular lymph node measures 1.6 cm, image 13/2. Previously 1 cm. Soft tissue mass anterior to the superior vena cava measures 2.4 x 2.2 cm, image 20/2. Previously 1.3 by 1.0 cm. Lungs/Pleura: Small to moderate right pleural effusion is unchanged. Loculated left pleural effusion is again identified and also appears similar to previous exam. Markedly diminished aeration to the entire left lung is identified which is concerning for prior progression of disease throughout the left lung. Innumerable pulmonary nodules are identified within the right lung. The dominant lesion is in the right middle lobe measuring 4.8 x 4.1 cm. On the previous exam this measured 3.1 x 2.5 cm. Index lesion within the right upper lobe measures 1.7 by 1.3 cm, image 38/3. Previously 1.0 by 1.1 cm. Musculoskeletal: Bilateral skin thickening and subcutaneous soft tissue stranding involving both breasts. When compared with the previous exam soft tissue edema and skin thickening of the right breast is new. New right chest wall tumor involving the right pectoralis muscle with multiple small satellite nodules is identified measuring 5.3 cm, image 12/2. Enhancing lesion within the posterior left breast measures 3.7 cm, image 13/2. New from previous exam. Multifocal sclerotic bone  metastases are identified. The appearance is unchanged from previous exam. CT ABDOMEN PELVIS FINDINGS Hepatobiliary: Multifocal liver metastases are identified. These involve both lobes. Index lesion within segment 7/8 measures 3.6 cm, image 46/2. New from previous exam. Index lesion within segment 4 is new measuring 3.9 cm, image 53/2. Index central right lobe of liver lesion measures 2 cm, image 62/2. Gallbladder normal.  No biliary dilatation. Pancreas: Unremarkable. No pancreatic ductal dilatation or surrounding inflammatory changes. Spleen: Normal in size without focal abnormality. Adrenals/Urinary Tract: New left adrenal nodule measures 1.8 by 1.2 cm, image 59/2. Normal right adrenal gland. No kidney mass or hydronephrosis. Urinary bladder normal. Stomach/Bowel: Stomach is within normal limits. Appendix appears normal. No evidence of bowel wall thickening, distention, or inflammatory changes. Vascular/Lymphatic: Normal appearance of the abdominal aorta. No aneurysm. New left retroperitoneal adenopathy. Index lymph node measures 1.7 cm, image 58/2. No pelvic or inguinal adenopathy. Reproductive: Uterus and bilateral adnexa are unremarkable. Other: No ascites or fluid collection. No peritoneal nodularity or mass. Musculoskeletal: Multifocal sclerotic bone metastases are identified. The appearance is stable compared with previous exam. IMPRESSION: 1. Compared with 02/06/2019 there is been interval progression of disease. 2. Progression of thoracic adenopathy. New left retroperitoneal adenopathy noted. 3. Multifocal liver metastases new from previous exam. 4. Persistent bilateral pleural effusions with significant interval worsening of aeration to the left lung. Multifocal pulmonary metastasis within the right lung are increased in size. 5. New right breast skin thickening and soft tissue infiltration identified. There is a right chest wall mass with multiple small satellite nodules identified. Electronically  Signed   By: Kerby Moors M.D.   On: 03/29/2019 17:47   Ct Angio Chest Pe W And/or Wo Contrast  Result Date: 04/09/2019 CLINICAL DATA:  PE suspected, high pretest probability. Acute shortness of breath with exertion, receiving chemotherapy for breast cancer  with possible brain metastases. Bucket handle EXAM: CT ANGIOGRAPHY CHEST WITH CONTRAST TECHNIQUE: Multidetector CT imaging of the chest was performed using the standard protocol during bolus administration of intravenous contrast. Multiplanar CT image reconstructions and MIPs were obtained to evaluate the vascular anatomy. CONTRAST:  13m OMNIPAQUE IOHEXOL 350 MG/ML SOLN COMPARISON:  CTA 03/09/2019 FINDINGS: Cardiovascular: Right internal jugular approach Port-A-Cath tip terminates at the right atrium. While there is satisfactory opacification of the central pulmonary arteries the study is markedly limited by both body habitus, streak from the patient's arms, and respiratory motion artifact which will limit detection of distal segmental and subsegmental pulmonary emboli. Furthermore, there is near complete collapse of the left lung which results and hypoventilatory vasoconstriction of the left pulmonary arteries. No central filling defect is identified. There is mass effect upon the right lower lobe central pulmonary artery by a large right hilar lymph node complex. Mediastinum/Nodes: Extensive pathologic adenopathy throughout the axillary mediastinal and hilar nodal stations, many of which have increased in size from comparison dated 03/09/2019. Reference nodes include: *Left supraclavicular lymph node (7/8), stable at 20 mm *Right axillary lymph node (7/31) increased to 35 mm, previously 32 mm. There is abrupt truncation of the central airways of the left lung (7/35) there is significant narrowing of the bronchus intermedius and right upper lobe airways, likely in part due to compressive affect by the right hilar adenopathy. The esophagus is unchanged  from prior. Lungs/Pleura: There is progressive airlessness throughout the left lung with only few residual areas of air bronchograms within the left upper and lower lobes. There is heterogeneous enhancement of the atelectatic lung parenchyma as well as a thickened pleural rind seen anteriorly in the left hemithorax. A right pleural drain is in place, similarly positioned comparison exam. Small right effusion remains. Bandlike areas of atelectasis and scarring present in the right lung. Redemonstration of the large pulmonary nodules and masses throughout the aerated portions of the right lung. Reference lesions include: *Largest mass in the right middle lobe (7/45) measures 4.7 cm, enlarged from 3.7 cm on prior. *Subpleural right lower lobe mass (7/49) measures 3.7 cm, enlarged from 2.3 cm on prior. Upper Abdomen: Retrocrural adenopathy again noted. No other acute abnormalities present in the visualized portions of the upper abdomen. Musculoskeletal: Extensive circumferential body wall edema which is eccentric to the left chest wall and breast tissue. There is an enhancing lesion in the left pectoralis major measuring 3.6 cm which is is similar in size to comparison CT where this measured 3.7 cm. Furthermore there is increasing soft tissue nodularity along the medial right breast which is suspected tumor extending from the right pectoralis musculature. Innumerable sclerotic lesions are present throughout the imaged portions of the spine and appendicular skeleton. No acute pathologic fracture or other acute abnormality of the bones. Review of the MIP images confirms the above findings. IMPRESSION: 1. Markedly limited study due to both body habitus, streak from the patient's arms, and respiratory motion artifact which will limit detection of distal segmental and subsegmental pulmonary emboli. No large central pulmonary embolus identified. 2. Extensive pathologic adenopathy throughout the bilateral axillary mediastinal  and hilar nodal stations, many of which have increased in size from comparison CT dated 03/09/2019, consistent with progression of metastatic disease. There is mass effect upon the bronchus intermedius and right upper lobe airways, likely in part due to compressive affect by the right hilar adenopathy. 3. Worsening airlessness throughout the left lung with only few residual areas of air bronchograms within the left  upper and lower lobes. Persistent left pleural rind likely reflecting malignant involvement of the pleura 4. Extensive metastatic disease throughout the aerated portions of the right lung, many of which have increased in size from comparison dated 03/09/2019. Right pleural drain is in place, similarly positioned. Small right effusion remains. 5. Extensive osseous metastatic disease, similar to prior. 6. Extensive circumferential body wall edema which is eccentric to the left chest wall and breast tissue, concerning for lymphangitic spread of tumor. 7. Increasing soft tissue nodularity extending from the right pectoralis major towards the skin surface concerning for tumor involvement. Electronically Signed   By: Lovena Le M.D.   On: 04/09/2019 02:05   Mr Jeri Cos SK Contrast  Result Date: 03/30/2019 CLINICAL DATA:  Follow-up metastatic disease EXAM: MRI HEAD WITHOUT AND WITH CONTRAST TECHNIQUE: Multiplanar, multiecho pulse sequences of the brain and surrounding structures were obtained without and with intravenous contrast. CONTRAST:  39m MULTIHANCE GADOBENATE DIMEGLUMINE 529 MG/ML IV SOLN COMPARISON:  10/30/2018 FINDINGS: BRAIN New Lesions: None. Very limited postcontrast imaging with essentially nondiagnostic coronal and sagittal postcontrast images. There is a 12 mm area of FLAIR hyperintensity and stippled enhancement along the upper and superficial left cerebellum. Larger lesions: None. Stable or Smaller lesions: A right frontal white matter lesion is no longer seen. Other Brain findings: There  is smooth dural thickening along the right parietal convexity subjacent to a large new right parietal bone metastasis which also invades/involves the overlying scalp which is reticulated abnormally enhancing. No incidental infarct, hemorrhage, hydrocephalus, or collection. Vascular: Major vascular enhancements are preserved, including the superior sagittal sinus near the large parietal bone metastasis. Skull and upper cervical spine: Stable osseous metastatic disease in the C3 body. Right parietal metastasis as noted above. Sinuses/Orbits: Negative but very motion degraded IMPRESSION: 1. Significantly limited by motion. 2. New 12 mm area of enhancement along the superficial left cerebellum which could be parenchyma metastasis or leptomeningeal disease. 3. 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 Electronically Signed   By: JMonte FantasiaM.D.   On: 03/30/2019 15:42   Ct Abdomen Pelvis W Contrast  Result Date: 03/29/2019 CLINICAL DATA:  Follow-up metastatic breast cancer. EXAM: CT CHEST, ABDOMEN, AND PELVIS WITH CONTRAST TECHNIQUE: Multidetector CT imaging of the chest, abdomen and pelvis was performed following the standard protocol during bolus administration of intravenous contrast. CONTRAST:  1053mOMNIPAQUE IOHEXOL 300 MG/ML  SOLN COMPARISON:  02/06/2019 FINDINGS: CT CHEST FINDINGS Cardiovascular: The heart size is normal. Small pericardial effusion. Mediastinum/Nodes: Normal appearance of the thyroid gland. The trachea appears patent and is midline. Normal appearance of the esophagus. Index right axillary node measures 3.2 cm short axis, image 13/2. Previously 2.5 cm. Index left axillary node measures 1.9 cm short axis. Previously this measured the same. Left supraclavicular lymph node measures 2 cm short axis, image 6/2. Previously 1.7 cm. Soft tissue infiltration throughout the mediastinal fat is identified which likely  represents a combination of edema and tumor infiltration. Enlarged right pre-vascular lymph node measures 1.6 cm, image 13/2. Previously 1 cm. Soft tissue mass anterior to the superior vena cava measures 2.4 x 2.2 cm, image 20/2. Previously 1.3 by 1.0 cm. Lungs/Pleura: Small to moderate right pleural effusion is unchanged. Loculated left pleural effusion is again identified and also appears similar to previous exam. Markedly diminished aeration to the entire left lung is identified which is concerning for prior progression of disease throughout the left lung. Innumerable pulmonary nodules  are identified within the right lung. The dominant lesion is in the right middle lobe measuring 4.8 x 4.1 cm. On the previous exam this measured 3.1 x 2.5 cm. Index lesion within the right upper lobe measures 1.7 by 1.3 cm, image 38/3. Previously 1.0 by 1.1 cm. Musculoskeletal: Bilateral skin thickening and subcutaneous soft tissue stranding involving both breasts. When compared with the previous exam soft tissue edema and skin thickening of the right breast is new. New right chest wall tumor involving the right pectoralis muscle with multiple small satellite nodules is identified measuring 5.3 cm, image 12/2. Enhancing lesion within the posterior left breast measures 3.7 cm, image 13/2. New from previous exam. Multifocal sclerotic bone metastases are identified. The appearance is unchanged from previous exam. CT ABDOMEN PELVIS FINDINGS Hepatobiliary: Multifocal liver metastases are identified. These involve both lobes. Index lesion within segment 7/8 measures 3.6 cm, image 46/2. New from previous exam. Index lesion within segment 4 is new measuring 3.9 cm, image 53/2. Index central right lobe of liver lesion measures 2 cm, image 62/2. Gallbladder normal.  No biliary dilatation. Pancreas: Unremarkable. No pancreatic ductal dilatation or surrounding inflammatory changes. Spleen: Normal in size without focal abnormality.  Adrenals/Urinary Tract: New left adrenal nodule measures 1.8 by 1.2 cm, image 59/2. Normal right adrenal gland. No kidney mass or hydronephrosis. Urinary bladder normal. Stomach/Bowel: Stomach is within normal limits. Appendix appears normal. No evidence of bowel wall thickening, distention, or inflammatory changes. Vascular/Lymphatic: Normal appearance of the abdominal aorta. No aneurysm. New left retroperitoneal adenopathy. Index lymph node measures 1.7 cm, image 58/2. No pelvic or inguinal adenopathy. Reproductive: Uterus and bilateral adnexa are unremarkable. Other: No ascites or fluid collection. No peritoneal nodularity or mass. Musculoskeletal: Multifocal sclerotic bone metastases are identified. The appearance is stable compared with previous exam. IMPRESSION: 1. Compared with 02/06/2019 there is been interval progression of disease. 2. Progression of thoracic adenopathy. New left retroperitoneal adenopathy noted. 3. Multifocal liver metastases new from previous exam. 4. Persistent bilateral pleural effusions with significant interval worsening of aeration to the left lung. Multifocal pulmonary metastasis within the right lung are increased in size. 5. New right breast skin thickening and soft tissue infiltration identified. There is a right chest wall mass with multiple small satellite nodules identified. Electronically Signed   By: Kerby Moors M.D.   On: 03/29/2019 17:47   Portable Chest X-ray  Result Date: 04/09/2019 CLINICAL DATA:  Intubation. Metastatic breast cancer. EXAM: PORTABLE CHEST 1 VIEW 8:37 a.m. COMPARISON:  04/07/2019 at 9:21 p.m. and CT scan of the chest dated 04/09/2019 was FINDINGS: Endotracheal tube tip appears in good position approximately 5 cm above the carina. NG tube tip is below the diaphragm. Power port in place with the tip at the cavoatrial junction, unchanged. There is persistent complete opacification of the left hemithorax. Numerous metastatic lesions are noted in the  right lung as demonstrated on the recent CT scan. IMPRESSION: Endotracheal tube in good position. Persistent complete opacification of the left hemithorax. Extensive metastatic lesions in the right lung. Electronically Signed   By: Lorriane Shire M.D.   On: 04/09/2019 08:59   Dg Chest Portable 1 View  Result Date: 03/27/2019 CLINICAL DATA:  43 year old female with increasing shortness of breath and cough. Stage IV breast cancer. EXAM: PORTABLE CHEST 1 VIEW COMPARISON:  03/06/2019 FINDINGS: Complete opacification of LEFT hemithorax is noted. A RIGHT Port-A-Cath is present with tip overlying the RIGHT atrium. Decreased airspace opacities within the RIGHT lung noted with RIGHT basilar atelectasis/consolidation.  There is no evidence of pneumothorax. No acute bony abnormalities are identified. A sclerotic lesion within the distal RIGHT clavicle is again noted. IMPRESSION: Complete opacification of the LEFT hemithorax which may represent a combination of airspace disease/consolidation/effusion. Increasing RIGHT lung airspace disease and RIGHT basilar atelectasis/consolidation. Electronically Signed   By: Margarette Canada M.D.   On: 03/21/2019 21:39

## 2019-04-09 NOTE — Progress Notes (Signed)
Received notification that patient appointment for tomorrow needs to be cancelled due to patient being hospitalized.   Was going to call the patient to check in, and discovered that she had been intubated this morning. Notified Dr Maylon Peppers of change in patient status.   Will continue to follow for any possible needs.

## 2019-04-09 NOTE — Progress Notes (Signed)
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

## 2019-04-09 NOTE — Progress Notes (Signed)
Pharmacy Antibiotic Note  Sharon Floyd is a 43 y.o. female admitted on 03/28/2019 with pneumonia and sepsis.  Pharmacy has been consulted for Vancomycin and Zosyn dosing.  Plan: Vancomycin 2000 mg IV x 1 dose. Vancomycin 1000 mg IV every 8 hours.  Goal trough 15-20 mcg/mL. Zosyn 3.375g IV q8h (4 hour infusion).  Monitor labs, c/s, and vanco levels as indicated.  Height: 5\' 4"  (162.6 cm) Weight: 242 lb (109.8 kg) IBW/kg (Calculated) : 54.7  Temp (24hrs), Avg:99.1 F (37.3 C), Min:98.7 F (37.1 C), Max:99.6 F (37.6 C)  Recent Labs  Lab 03/22/2019 2146 04/09/19 0721  WBC 9.7 9.3  CREATININE 0.85 0.86  LATICACIDVEN 1.7  --     Estimated Creatinine Clearance: 101.1 mL/min (by C-G formula based on SCr of 0.86 mg/dL).    Allergies  Allergen Reactions  . Aspirin Other (See Comments)    CHILDHOOD REACTION:UNKNOWN    Antimicrobials this admission: Vanco 8/31 >>  Zosyn 8/31 >>  Azith 8/30 >> 8/31 CTX 8/30 >> 8/31  Dose adjustments this admission: N/A  Microbiology results: 8/30 BCx: ngtd 8/30 UCx: pending  8/30 Sputum: pending  8/31 MRSA PCR: pending  Thank you for allowing pharmacy to be a part of this patient's care.  Ramond Craver 04/09/2019 10:25 AM

## 2019-04-09 NOTE — Consult Note (Signed)
Consult requested by: [facility] hospital[facility], Dr. 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  She came to the emergency department because of increasing shortness of breath.  Not much history was available because she was having so much shortness of breath.  She was clearly struggling to breathe and ended up being intubated and placed on mechanical ventilation about 10 minutes ago.  History is from the medical record and from Dr. Manuella Ghazi as she is intubated and unable to provide any history.  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  Because of concerns that she might be having a pulmonary embolism causing her increased shortness of breath she had CT angios of the chest that I have personally reviewed.  This did not show any large central pulmonary embolism but was limited.  She had extensive adenopathy through the axillary mediastinal and hilar areas and apparently have increased in size from 1 month ago.  There is a masslike effect on the bronchus intermedius that looks like it is from compression from the right hilar adenopathy.  On the left she has almost complete white out of the left lung and what looks like a left pleural rind presumably from metastatic involvement of the pleura.  She has extensive metastatic disease throughout the right lung most of which has increased in size.  She has extensive bony metastatic disease.  She has what looks like body wall edema that is limited and may indicate lymphangitic spread of tumor.  She has soft tissue nodularity around the right pectoralis that looks like tumor.  Considering all of this discussion regarding CODE STATUS was made and the patient said that she she is 43 years old and has a small child at home and she is not ready for DNR status.  She chose her mother to make decisions for her in the event that she can  no longer make decisions on her own.  Past Medical History:  Diagnosis Date  . Arthritis   . Cancer [facility])    breast  . Family history of breast cancer   . Family history of colon cancer      Family History  Problem Relation Age of Onset  . Arthritis Mother   . Diabetes Father   . Hypertension Father   . Arthritis Father   . Heart disease Father   . Early death Maternal Grandmother   . Asthma Maternal Grandfather   . Heart disease Maternal Grandfather   . Migraines Maternal Grandfather   . Early death Maternal Grandfather   . Asthma Paternal Grandmother   . Hypertension Paternal Grandmother   . Asthma Paternal Grandfather   . Hypertension Paternal Grandfather   . Breast cancer Other        paternal cousin, female  . Colon cancer Cousin        paternal  . Breast cancer Cousin        paternal  . Lung cancer Other      Social History   Socioeconomic History  . Marital status: Single    Spouse name: Not on file  . Number of children: Not on file  . Years of education: Not on file  . Highest education level: Not on file  Occupational History  . Not on file  Social Needs  . Financial resource strain: Not on file  . Food insecurity  Worry: Not on file    Inability: Not on file  . Transportation needs    Medical: No    Non-medical: No  Tobacco Use  . Smoking status: Former Smoker    Types: Cigarettes    Quit date: 10/09/2018    Years since quitting: 0.4  . Smokeless tobacco: Never Used  Substance and Sexual Activity  . Alcohol use: Not Currently  . Drug use: Never  . Sexual activity: Not Currently  Lifestyle  . Physical activity    Days per week: Not on file    Minutes per session: Not on file  . Stress: Not on file  Relationships  . Social Herbalist on phone: Not on file    Gets together: Not on file    Attends religious service: Not on file    Active member of club or organization: Not on file    Attends meetings of clubs or organizations:  Not on file    Relationship status: Not on file  Other Topics Concern  . Not on file  Social History Narrative  . Not on file     ROS: Not obtainable    Objective: Vital signs in last 24 hours: Temp:  [98.7 F (37.1 C)-99.6 F (37.6 C)] 98.7 F (37.1 C) (08/31 0624) Pulse Rate:  [122-134] 131 (08/31 0708) Resp:  [14-40] 34 (08/31 0708) BP: (103-154)/(67-93) 112/72 (08/31 0708) SpO2:  [90 %-100 %] 100 % (08/31 0708) Weight:  [109.8 kg] 109.8 kg (08/30 2051) Weight change:  Last BM Date: 04/09/19  Intake/Output from previous day: 08/30 0701 - 08/31 0700 In: 362.7 [I.V.:14.6; IV Piggyback:348.1] Out: -   PHYSICAL EXAM Constitutional: She is intubated and on mechanical ventilation.  She is obese. eyes: Pupils react. ears nose mouth and throat: Mucous membranes are dry.  Cardiovascular: Her heart is regular with tachycardia at about 130.  Heart sounds are distant. respiratory: She is intubated and on the ventilator.  Breath sounds are very distant.  Gastrointestinal: Her abdomen is soft obese no masses.  Musculoskeletal: Cannot assess.  Neurological: Cannot assess.  Psychiatric: Cannot assess  Lab Results: Basic Metabolic Panel: Recent Labs    04/09/2019 2146 04/09/19 0721  NA 140 139  K 4.2 4.0  CL 101 100  CO2 29 28  GLUCOSE 96 99  BUN 14 13  CREATININE 0.85 0.86  CALCIUM 9.1 9.0  MG  --  2.2   Liver Function Tests: Recent Labs    03/28/2019 2146 04/09/19 0721  AST 49* 46*  ALT 27 25  ALKPHOS 145* 142*  BILITOT 0.3 0.2*  PROT 6.9 7.0  ALBUMIN 2.4* 2.3*   No results for input(s): LIPASE, AMYLASE in the last 72 hours. No results for input(s): AMMONIA in the last 72 hours. CBC: Recent Labs    03/27/2019 2146 04/09/19 0721  WBC 9.7 9.3  NEUTROABS 8.1*  --   HGB 10.5* 10.2*  HCT 36.3 36.3  MCV 89.6 90.3  PLT 548* 517*   Cardiac Enzymes: No results for input(s): CKTOTAL, CKMB, CKMBINDEX, TROPONINI in the last 72 hours. BNP: No results for input(s):  PROBNP in the last 72 hours. D-Dimer: No results for input(s): DDIMER in the last 72 hours. CBG: No results for input(s): GLUCAP in the last 72 hours. Hemoglobin A1C: No results for input(s): HGBA1C in the last 72 hours. Fasting Lipid Panel: No results for input(s): CHOL, HDL, LDLCALC, TRIG, CHOLHDL, LDLDIRECT in the last 72 hours. Thyroid Function Tests: No results for  input(s): TSH, T4TOTAL, FREET4, T3FREE, THYROIDAB in the last 72 hours. Anemia Panel: No results for input(s): VITAMINB12, FOLATE, FERRITIN, TIBC, IRON, RETICCTPCT in the last 72 hours. Coagulation: Recent Labs    03/19/2019 2146  LABPROT 13.2  INR 1.0   Urine Drug Screen: Drugs of Abuse  No results found for: LABOPIA, COCAINSCRNUR, LABBENZ, AMPHETMU, THCU, LABBARB  Alcohol Level: No results for input(s): ETH in the last 72 hours. Urinalysis: Recent Labs    04/02/2019 2124  COLORURINE YELLOW  LABSPEC >1.046*  PHURINE 5.0  GLUCOSEU NEGATIVE  HGBUR NEGATIVE  BILIRUBINUR NEGATIVE  KETONESUR NEGATIVE  PROTEINUR 30*  NITRITE NEGATIVE  LEUKOCYTESUR NEGATIVE   Misc. Labs:   ABGS: No results for input(s): PHART, PO2ART, TCO2, HCO3 in the last 72 hours.  Invalid input(s): PCO2   MICROBIOLOGY: Recent Results (from the past 240 hour(s))  Blood culture (routine x 2)     Status: None (Preliminary result)   Collection Time: 03/18/2019  9:46 PM   Specimen: A-Line; Blood  Result Value Ref Range Status   Specimen Description A-LINE  Final   Special Requests   Final    BOTTLES DRAWN AEROBIC AND ANAEROBIC Blood Culture adequate volume Performed at [facility], [address] [place], [place] [address]    Culture PENDING  Incomplete   Report Status PENDING  Incomplete  Blood culture (routine x 2)     Status: None (Preliminary result)   Collection Time: 03/22/2019  9:46 PM   Specimen: A-Line; Blood  Result Value Ref Range Status   Specimen Description A-LINE  Final   Special Requests   Final    BOTTLES DRAWN  AEROBIC AND ANAEROBIC Blood Culture adequate volume Performed at [facility], [address] [place], [place] [address]    Culture PENDING  Incomplete   Report Status PENDING  Incomplete  SARS Coronavirus 2 [facility] order, Performed in [place] hospital lab) Nasopharyngeal Nasopharyngeal Swab     Status: None   Collection Time: 03/18/2019 11:25 PM   Specimen: Nasopharyngeal Swab  Result Value Ref Range Status   SARS Coronavirus 2 NEGATIVE NEGATIVE Final    Comment: (NOTE) If result is NEGATIVE SARS-CoV-2 target nucleic acids are NOT DETECTED. The SARS-CoV-2 RNA is generally detectable in upper and lower  respiratory specimens during the acute phase of infection. The lowest  concentration of SARS-CoV-2 viral copies this assay can detect is 250  copies / mL. A negative result does not preclude SARS-CoV-2 infection  and should not be used as the sole basis for treatment or other  patient management decisions.  A negative result may occur with  improper specimen collection / handling, submission of specimen other  than nasopharyngeal swab, presence of viral mutation(s) within the  areas targeted by this assay, and inadequate number of viral copies  (<250 copies / mL). A negative result must be combined with clinical  observations, patient history, and epidemiological information. If result is POSITIVE SARS-CoV-2 target nucleic acids are DETECTED. The SARS-CoV-2 RNA is generally detectable in upper and lower  respiratory specimens dur ing the acute phase of infection.  Positive  results are indicative of active infection with SARS-CoV-2.  Clinical  correlation with patient history and other diagnostic information is  necessary to determine patient infection status.  Positive results do  not rule out bacterial infection or co-infection with other viruses. If result is PRESUMPTIVE POSTIVE SARS-CoV-2 nucleic acids MAY BE PRESENT.   A presumptive positive result was obtained on the  submitted specimen  and confirmed on repeat testing.  While 2019 novel coronavirus  (SARS-CoV-2) nucleic acids may be present in the submitted sample  additional confirmatory testing may be necessary for epidemiological  and / or clinical management purposes  to differentiate between  SARS-CoV-2 and other Sarbecovirus currently known to infect humans.  If clinically indicated additional testing with an alternate test  methodology [phone]) is advised. The SARS-CoV-2 RNA is generally  detectable in upper and lower respiratory sp ecimens during the acute  phase of infection. The expected result is Negative. Fact Sheet for Patients:  StrictlyIdeas.no Fact Sheet for Healthcare Providers: BankingDealers.co.za This test is not yet approved or cleared by the [place] FDA and has been authorized for detection and/or diagnosis of SARS-CoV-2 by FDA under an Emergency Use Authorization (EUA).  This EUA will remain in effect (meaning this test can be used) for the duration of the COVID-19 declaration under Section 564(b)(1) of the Act, 21 U.S.C. section 360bbb-3(b)(1), unless the authorization is terminated or revoked sooner. Performed at [facility], [address], [place], [place] [address]     Studies/Results: Ct Head Wo Contrast  Result Date: 04/09/2019 CLINICAL DATA:  Initial evaluation for acute altered mental status. EXAM: CT HEAD WITHOUT CONTRAST TECHNIQUE: Contiguous axial images were obtained from the base of the skull through the vertex without intravenous contrast. COMPARISON:  Comparison made with recent brain MRI from 03/30/2019. FINDINGS: Brain: Cerebral volume stable, and within normal limits for age. Previously question metastatic lesion involving the superior left cerebellum not visible by CT. No other discrete mass lesion, mass effect, or midline shift. No intracranial hemorrhage. No acute large vessel territory infarct.  Ventricles normal size without hydrocephalus. No extra-axial fluid collection. Vascular: No hyperdense vessel. 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 Additional 6 mm soft tissue nodule at the right frontal scalp also unchanged. No other new focal osseous lesions. Sinuses/Orbits: Globes orbital soft tissues within normal limits. Mild scattered mucosal thickening within the sphenoid sinuses. Visualized paranasal sinuses are otherwise clear. Mastoid air cells and middle ear cavities are well pneumatized and free of fluid. Other: Malignant adenopathy partially visualized within the left supraclavicular fossa, better seen on recent neck CT. IMPRESSION: 1. No acute intracranial abnormality. 2. 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 3. Previously question left superficial cerebellar lesion not visible by CT. 4. Malignant adenopathy partially visualized within the left supraclavicular fossa, better seen on recent neck CT. Electronically Signed   By: Jeannine Boga M.D.   On: 04/09/2019 01:59   Ct Angio Chest Pe W And/or Wo Contrast  Result Date: 04/09/2019 CLINICAL DATA:  PE suspected, high pretest probability. Acute shortness of breath with exertion, receiving chemotherapy for breast cancer with possible brain metastases. Bucket handle EXAM: CT ANGIOGRAPHY CHEST WITH CONTRAST TECHNIQUE: Multidetector CT imaging of the chest was performed using the standard protocol during bolus administration of intravenous contrast. Multiplanar CT image reconstructions and MIPs were obtained to evaluate the vascular anatomy. CONTRAST:  117mL OMNIPAQUE IOHEXOL 350 MG/ML SOLN COMPARISON:  CTA 03/09/2019 FINDINGS: Cardiovascular: Right internal jugular approach Port-A-Cath tip terminates at the right atrium. While there is satisfactory opacification of the central pulmonary arteries the study  is markedly limited by both body habitus, streak from the patient's arms, and respiratory motion artifact which will limit detection of distal segmental and subsegmental pulmonary emboli. Furthermore, there is near complete collapse of the left lung which results and hypoventilatory  vasoconstriction of the left pulmonary arteries. No central filling defect is identified. There is mass effect upon the right lower lobe central pulmonary artery by a large right hilar lymph node complex. Mediastinum/Nodes: Extensive pathologic adenopathy throughout the axillary mediastinal and hilar nodal stations, many of which have increased in size from comparison dated 03/09/2019. Reference nodes include: *Left supraclavicular lymph node (7/8), stable at 20 mm *Right axillary lymph node (7/31) increased to 35 mm, previously 32 mm. There is abrupt truncation of the central airways of the left lung (7/35) there is significant narrowing of the bronchus intermedius and right upper lobe airways, likely in part due to compressive affect by the right hilar adenopathy. The esophagus is unchanged from prior. Lungs/Pleura: There is progressive airlessness throughout the left lung with only few residual areas of air bronchograms within the left upper and lower lobes. There is heterogeneous enhancement of the atelectatic lung parenchyma as well as a thickened pleural rind seen anteriorly in the left hemithorax. A right pleural drain is in place, similarly positioned comparison exam. Small right effusion remains. Bandlike areas of atelectasis and scarring present in the right lung. Redemonstration of the large pulmonary nodules and masses throughout the aerated portions of the right lung. Reference lesions include: *Largest mass in the right middle lobe (7/45) measures 4.7 cm, enlarged from 3.7 cm on prior. *Subpleural right lower lobe mass (7/49) measures 3.7 cm, enlarged from 2.3 cm on prior. Upper Abdomen: Retrocrural adenopathy again noted.  No other acute abnormalities present in the visualized portions of the upper abdomen. Musculoskeletal: Extensive circumferential body wall edema which is eccentric to the left chest wall and breast tissue. There is an enhancing lesion in the left pectoralis major measuring 3.6 cm which is is similar in size to comparison CT where this measured 3.7 cm. Furthermore there is increasing soft tissue nodularity along the medial right breast which is suspected tumor extending from the right pectoralis musculature. Innumerable sclerotic lesions are present throughout the imaged portions of the spine and appendicular skeleton. No acute pathologic fracture or other acute abnormality of the bones. Review of the MIP images confirms the above findings. IMPRESSION: 1. Markedly limited study due to both body habitus, streak from the patient's arms, and respiratory motion artifact which will limit detection of distal segmental and subsegmental pulmonary emboli. No large central pulmonary embolus identified. 2. Extensive pathologic adenopathy throughout the bilateral axillary mediastinal and hilar nodal stations, many of which have increased in size from comparison CT dated 03/09/2019, consistent with progression of metastatic disease. There is mass effect upon the bronchus intermedius and right upper lobe airways, likely in part due to compressive affect by the right hilar adenopathy. 3. Worsening airlessness throughout the left lung with only few residual areas of air bronchograms within the left upper and lower lobes. Persistent left pleural rind likely reflecting malignant involvement of the pleura 4. Extensive metastatic disease throughout the aerated portions of the right lung, many of which have increased in size from comparison dated 03/09/2019. Right pleural drain is in place, similarly positioned. Small right effusion remains. 5. Extensive osseous metastatic disease, similar to prior. 6. Extensive circumferential body wall  edema which is eccentric to the left chest wall and breast tissue, concerning for lymphangitic spread of tumor. 7. Increasing soft tissue nodularity extending from the right pectoralis major towards the skin surface concerning for tumor involvement. Electronically Signed   By: Lovena Le M.D.   On: 04/09/2019 02:05   Dg Chest Portable 1 View  Result Date: 03/16/2019 CLINICAL DATA:  43 year old female with increasing shortness of breath and cough. Stage IV breast cancer. EXAM: PORTABLE CHEST 1 VIEW COMPARISON:  03/06/2019 FINDINGS: Complete opacification of LEFT hemithorax is noted. A RIGHT Port-A-Cath is present with tip overlying the RIGHT atrium. Decreased airspace opacities within the RIGHT lung noted with RIGHT basilar atelectasis/consolidation. There is no evidence of pneumothorax. No acute bony abnormalities are identified. A sclerotic lesion within the distal RIGHT clavicle is again noted. IMPRESSION: Complete opacification of the LEFT hemithorax which may represent a combination of airspace disease/consolidation/effusion. Increasing RIGHT lung airspace disease and RIGHT basilar atelectasis/consolidation. Electronically Signed   By: Margarette Canada M.D.   On: 03/27/2019 21:39    Medications:  Prior to Admission:  Medications Prior to Admission  Medication Sig Dispense Refill Last Dose  . cyclobenzaprine (FLEXERIL) 10 MG tablet Take 1 tablet (10 mg total) by mouth 3 (three) times daily as needed for muscle spasms. 30 tablet 0 unknown  . gabapentin (NEURONTIN) 300 MG capsule Take 3 capsules (900 mg total) by mouth 2 (two) times daily. 180 capsule 5 03/27/2019 at Unknown time  . guaiFENesin (MUCINEX) 600 MG 12 hr tablet Take 600 mg by mouth 2 (two) times daily.   03/16/2019 at Unknown time  . ipratropium (ATROVENT HFA) 17 MCG/ACT inhaler Inhale 2 puffs into the lungs every 4 (four) hours as needed for wheezing. 1 Inhaler 12 04/07/2019 at Unknown time  . lidocaine-prilocaine (EMLA) cream Apply to  affected area once 30 g 3 unknown  . LORazepam (ATIVAN) 0.5 MG tablet Take 1 tablet (0.5 mg total) by mouth every 6 (six) hours as needed (Nausea or vomiting). 50 tablet 0 03/29/2019 at Unknown time  . metoprolol tartrate (LOPRESSOR) 25 MG tablet Take 0.5 tablets (12.5 mg total) by mouth 2 (two) times daily. 30 tablet 1 03/16/2019 at 1900  . oxyCODONE (OXY IR/ROXICODONE) 5 MG immediate release tablet Take 2 tablets (10 mg total) by mouth every 4 (four) hours as needed for up to 10 days for severe pain. 120 tablet 0 03/24/2019 at Unknown time  . prochlorperazine (COMPAZINE) 10 MG tablet Take 1 tablet (10 mg total) by mouth every 6 (six) hours as needed (Nausea or vomiting). 30 tablet 4 unknown  . UNABLE TO FIND As per Medical necessity, Mastectomy Bra Q#2-Dx Code C50.812, Z17.1 Malignant neoplasm of overlapping sites of left breast in female, estrogen receptor negative 2 each 0 unknown  . dexamethasone (DECADRON) 4 MG tablet Take 2 tablets (8 mg total) by mouth daily. Start the day after carboplatin chemotherapy for 3 days. (Patient not taking: Reported on 04/04/2019) 30 tablet 1 Not Taking at Unknown time  . fentaNYL (DURAGESIC) 50 MCG/HR Place 1 patch onto the skin every 3 (three) days. (Patient not taking: Reported on 04/04/2019) 10 patch 0 Not Taking at Unknown time  . ondansetron (ZOFRAN) 8 MG tablet Take 1 tablet (8 mg total) by mouth 2 (two) times daily as needed for refractory nausea / vomiting. 30 tablet 4 unknown   Scheduled: . Chlorhexidine Gluconate Cloth  6 each Topical Daily  . enoxaparin (LOVENOX) injection  60 mg Subcutaneous Q24H  . fentaNYL  1 patch Transdermal Q72H  . gabapentin  900 mg Oral BID  . mouth rinse  15 mL Mouth Rinse BID  . metoprolol tartrate  5 mg Intravenous Q8H   Continuous: . sodium chloride 100 mL/hr at 04/09/19 0623  . azithromycin Stopped (04/09/19 0010)  . cefTRIAXone (ROCEPHIN)  IV Stopped (03/19/2019 2320)  .  propofol (DIPRIVAN) infusion 5 mcg/kg/min (04/09/19  0741)   [id number] **OR** acetaminophen, bisacodyl, fentaNYL (SUBLIMAZE) injection, fentaNYL (SUBLIMAZE) injection, ipratropium-albuterol, ondansetron **OR** ondansetron (ZOFRAN) IV  Assesment: She was admitted with sepsis.  She has pneumonia.  A lot of her problem is from her extensive metastatic disease.  She has had increasing respiratory difficulty culminating in her being intubated and placed on the ventilator.  She is critically  And likely fatally ill  She has breast cancer with extensive metastatic disease Active Problems:   Sepsis ([place])   Sepsis due to pneumonia [facility])    Plan: I would broaden antibiotics to cover healthcare associated pneumonia since she has been going to her oncologist office.  Add steroids because of history of smoking and potential COPD.  I have written ventilator orders and sedation orders.  Thanks for allowing me to see her with you    LOS: 0 days   Alonza Bogus 04/09/2019, 7:46 AM

## 2019-04-09 NOTE — Progress Notes (Signed)
I reviewed Sharon Floyd's medical records this morning, including her most recent CT and MRIs.  Unfortunately, she has had rapid disease progression, including extensive metastases involving the lungs, thoracic adenopathy, bony and liver metastases.  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  We have had several discussions regarding goals of care, but patient is very insistent on pursuing any treatment as she has a teenage son that she wants to be there for, which is completely understandable and admirable.    Unfortunately, despite her best effort to adhere to treatment, her cancer has continued to progress very rapidly, and she was intubated on the morning of 04/09/2019 due to worsening hypoxic respiratory failure.  I spoke at length with the patient's mother Tamela Oddi regarding the patient's cancer status.  In light of her rapidly progressing disease involving bilateral lungs, I expressed my concern that intubation was not going to change the overall prognosis, as [name]'s rapidly progressing disease and declining performance status make her a poor candidate for further chemotherapy at this time.  While I understand and appreciate her willingness to take on any treatment for her son's sake, unfortunately, further chemotherapy would unlikely provide any meaningful benefit at this time. After lengthy discussions, the patient's mother and I agreed that we would continue maximal supportive care, including IV antibiotics, to see if her breathing would improve.  However, if she does not make any improvement, then her respiratory failure is most likely due to her underlying metastatic breast cancer, and further ventilatory support would cause  unnecessary suffering to the patient without extending her life expectancy in light of the poor overall prognosis.   I would recommend consulting palliative care/hospice to continue goals of care discussion and possibly facilitating her transition toward end-of-life care if the patient does not make any significant improvement with maximal supportive care.  Thank you for caring for our mutual patient.  Please do not hesitate to contact me if there are any questions.

## 2019-04-09 NOTE — Progress Notes (Signed)
Ed never called for 0545 blood gas, then moved up to Unit 0630. Patient placed on BiPAP 14/6 f 12 100.    Blood gas drawn.

## 2019-04-09 NOTE — H&P (Signed)
TRH H&P    Patient Demographics:    Sharon Floyd, is a 43 y.o. female  MRN: [id number]  DOB - 1976-05-15  Admit Date - 03/30/2019  Referring MD/NP/PA: Dr. Tomi Bamberger  Outpatient Primary MD for the patient is Rakes, Connye Burkitt, FNP  Patient coming from: Home  Chief complaint-worsening shortness of breath   HPI:    Sharon Floyd  is a 43 y.o. 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  Patient reports that she wears 3 L nasal cannula at home.  She started feeling increased shortness of breath last night.  She also had an increase in her productive cough with thick secretions.  Patient denies hemoptysis.  Patient reports that she increased her nasal cannula at home and had no improvement in symptoms.  She does follow with Dr. Maylon Peppers reports that her last appointment was last week.  Patient denies any pain at this time.  Due to patient's work of breathing she was not able to give a more extensive history.  Remainder of history is taken from the ER medical record.  Patient did admit to a low-grade fever at home to the ER provider.  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  Family had reported that the patient has been acting more fatigued and worn out recently.  Patient denies any chest pain vomiting dysuria or known COVID exposures.  COVID test is negative.  Patient insists on remaining a full code.  We discussed the potential complications of a prolonged code.  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  She reports that she has a young son at home, and is not prepared to be a DNR.  In the event that she is not able to make decisions she wants her mother to make them for her contact info is listed below.   ED course Chemistry profile shows normal electrolytes, elevated alkaline phosphatase, decreased albumin at 2.4, lactic  acid of 1.7.  Hematology reveals no leukocytosis.  Patient had an x-ray of chest that showed complete opacification of the left hemithorax which may represent a combination of airspace disease/consolidation/effusion.  Increasing right lung airspace disease and right basilar atelectasis/consolidation.  CT angios shows #1.  Markedly limited study due to both body habitus, streak from the patient's arms, and respiratory motion artifact which will limit detection of distal segmental and subsegmental pulmonary emboli.  No large central pulmonary embolus identified. 2.  Extensive pathologic adenopathy throughout the bilateral axillary mediastinal and hilar node stations, many of which have increased in size from comparison CT dated 03/09/2019, consistent with progression of metastatic disease.  There is mass-effect upon the bronchus intermedius of the right upper lobe airways, likely in part due to compressive effect by the right hilar adenopathy. 3.  Worsening airlessness throughout the left lung with only few residual areas of air bronchograms within the left upper and lower lobes persistent left pleural rind likely reflecting malignant involvement of the pleura. 4.  Extensive  metastatic disease throughout the aerated portions of the right lung, many of which have increased in size from comparison study on 03/09/2019.  Right pleural drain is in place similarly positioned.  Small right effusion remains.   5. extensive osseous metastatic disease, similar to prior. 6.  Extensive circumferential body wall edema which is eccentric to the left chest wall and breast tissue, concerning for lymphangitic spread of tumor. 7.  Increasing soft tissue nodularity extending from the right pectorals major towards the skin surface concerning for tumor involvement.  Blood cultures were also drawn.  Patient was started on ceftriaxone and azithromycin for community-acquired pneumonia.  Patient is being admitted to the stepdown unit.       Preferred person to make decisions for patient in the even that she is unable to make her own decisions, Mother:Telephone number [phone]  Home                                    [phone]    Cell    Review of systems:    In addition to the HPI above,  No Fever-chills, No Headache, No changes with Vision or hearing, No problems swallowing food or Liquids, No Chest pain, endorses productive cough and shortness of breath. No Abdominal pain, No Nausea or Vomiting, bowel movements are regular, No Blood in stool or Urine, No dysuria, No new skin rashes or bruises reported No new joints pains-aches,  No new weakness, tingling, numbness in any extremity, No recent weight gain or loss, No polyuria, polydypsia or polyphagia, No significant Mental Stressors.  All other systems reviewed and are negative.    Past History of the following :    Past Medical History:  Diagnosis Date   Arthritis    Cancer ([place])    breast   Family history of breast cancer    Family history of colon cancer       Past Surgical History:  Procedure Laterality Date   IR IMAGING GUIDED PORT INSERTION  10/19/2018   IR PERC PLEURAL DRAIN W/INDWELL CATH W/IMG GUIDE  11/01/2018   IR PERC PLEURAL DRAIN W/INDWELL CATH W/IMG GUIDE  03/07/2019   IR REMOVAL OF PLURAL CATH W/CUFF  02/16/2019   IR SINUS/FIST TUBE CHK-NON GI  12/06/2018   IR THORACENTESIS ASP PLEURAL SPACE W/IMG GUIDE  02/19/2019   IR US GUIDE BX ASP/DRAIN  10/19/2018   PATELLA REALIGNMENT Left 1992      Social History:      Social History   Tobacco Use   Smoking status: Former Smoker    Types: Cigarettes    Quit date: 10/09/2018    Years since quitting: 0.4   Smokeless tobacco: Never Used  Substance Use Topics   Alcohol use: Not Currently       Family History :     Family History  Problem Relation Age of Onset   Arthritis Mother    Diabetes Father    Hypertension Father    Arthritis Father    Heart  disease Father    Early death Maternal Grandmother    Asthma Maternal Grandfather    Heart disease Maternal Grandfather    Migraines Maternal Grandfather    Early death Maternal Grandfather    Asthma Paternal Grandmother    Hypertension Paternal Grandmother    Asthma Paternal Grandfather    Hypertension Paternal Grandfather    Breast cancer Other  paternal cousin, female   Colon cancer Cousin        paternal   Breast cancer Cousin        paternal   Lung cancer Other       Home Medications:   Prior to Admission medications   Medication Sig Start Date End Date Taking? Authorizing Provider  cyclobenzaprine (FLEXERIL) 10 MG tablet Take 1 tablet (10 mg total) by mouth 3 (three) times daily as needed for muscle spasms. 03/15/19  Yes Tish Men, MD  gabapentin (NEURONTIN) 300 MG capsule Take 3 capsules (900 mg total) by mouth 2 (two) times daily. 03/15/19 04/14/19 Yes Tish Men, MD  guaiFENesin (MUCINEX) 600 MG 12 hr tablet Take 600 mg by mouth 2 (two) times daily.   Yes [provider]  ipratropium (ATROVENT HFA) 17 MCG/ACT inhaler Inhale 2 puffs into the lungs every 4 (four) hours as needed for wheezing. 03/15/19  Yes Tish Men, MD  lidocaine-prilocaine (EMLA) cream Apply to affected area once 11/03/18  Yes Tish Men, MD  LORazepam (ATIVAN) 0.5 MG tablet Take 1 tablet (0.5 mg total) by mouth every 6 (six) hours as needed (Nausea or vomiting). 03/15/19  Yes Tish Men, MD  metoprolol tartrate (LOPRESSOR) 25 MG tablet Take 0.5 tablets (12.5 mg total) by mouth 2 (two) times daily. 03/10/19 04/09/19 Yes Donne Hazel, MD  oxyCODONE (OXY IR/ROXICODONE) 5 MG immediate release tablet Take 2 tablets (10 mg total) by mouth every 4 (four) hours as needed for up to 10 days for severe pain. 04/04/19 04/14/19 Yes [place], Holli Humbles, NP  prochlorperazine (COMPAZINE) 10 MG tablet Take 1 tablet (10 mg total) by mouth every 6 (six) hours as needed (Nausea or vomiting). 11/03/18  Yes Tish Men, MD    UNABLE TO FIND As per Medical necessity, Mastectomy Bra Q#2-Dx Code C50.812, Z17.1 Malignant neoplasm of overlapping sites of left breast in female, estrogen receptor negative 02/15/19  Yes Tish Men, MD  dexamethasone (DECADRON) 4 MG tablet Take 2 tablets (8 mg total) by mouth daily. Start the day after carboplatin chemotherapy for 3 days. Patient not taking: Reported on 04/04/2019 04/03/19   Tish Men, MD  fentaNYL (DURAGESIC) 50 MCG/HR Place 1 patch onto the skin every 3 (three) days. Patient not taking: Reported on 04/04/2019 03/29/19 04/28/19  Tish Men, MD  ondansetron [facility]) 8 MG tablet Take 1 tablet (8 mg total) by mouth 2 (two) times daily as needed for refractory nausea / vomiting. 11/03/18   Tish Men, MD     Allergies:     Allergies  Allergen Reactions   Aspirin Other (See Comments)    CHILDHOOD REACTION:UNKNOWN     Physical Exam:   Vitals  Blood pressure 120/88, pulse (!) 122, temperature 99.6 F (37.6 C), temperature source Oral, resp. rate 20, height 5' 4"  (1.626 m), weight 109.8 kg, SpO2 95 %.  1.  General: Sitting up in bed hunched over with increased work of breathing  2. Psychiatric: Alert and oriented x3  3. Neurologic: Cranial nerves II through XII are grossly intact no focal deficits on limited exam  4. HEENMT:  Head is atraumatic normocephalic trachea is midline pupils are reactive to light  5. Respiratory : Rhonchi present in left upper lung field.  Diminished breath sounds in left lower lung field.  Crackles present in right lung field.  6. Cardiovascular : Tachycardic rate no murmurs rubs or gallops  7. Gastrointestinal:  Soft nondistended nontender to palpation  8. Skin:  Acanthosis nigricans on posterior  neck  9.Musculoskeletal:  2+ pitting edema to midshin    Data Review:    CBC Recent Labs  Lab 03/22/2019 2146  WBC 9.7  HGB 10.5*  HCT 36.3  PLT 548*  MCV 89.6  MCH 25.9*  MCHC 28.9*  RDW 18.6*  LYMPHSABS 0.5*  MONOABS 0.9   EOSABS 0.0  BASOSABS 0.0   ------------------------------------------------------------------------------------------------------------------  Results for orders placed or performed during the hospital encounter of 03/13/2019 (from the past 48 hour(s))  CBC with Differential     Status: Abnormal   Collection Time: 03/20/2019  9:46 PM  Result Value Ref Range   WBC 9.7 4.0 - 10.5 K/uL   RBC 4.05 3.87 - 5.11 MIL/uL   Hemoglobin 10.5 (L) 12.0 - 15.0 g/dL   HCT 36.3 36.0 - 46.0 %   MCV 89.6 80.0 - 100.0 fL   MCH 25.9 (L) 26.0 - 34.0 pg   MCHC 28.9 (L) 30.0 - 36.0 g/dL   RDW 18.6 (H) 11.5 - 15.5 %   Platelets 548 (H) 150 - 400 K/uL   nRBC 0.0 0.0 - 0.2 %   Neutrophils Relative % 83 %   Neutro Abs 8.1 (H) 1.7 - 7.7 K/uL   Lymphocytes Relative 5 %   Lymphs Abs 0.5 (L) 0.7 - 4.0 K/uL   Monocytes Relative 10 %   Monocytes Absolute 0.9 0.1 - 1.0 K/uL   Eosinophils Relative 0 %   Eosinophils Absolute 0.0 0.0 - 0.5 K/uL   Basophils Relative 0 %   Basophils Absolute 0.0 0.0 - 0.1 K/uL   Immature Granulocytes 2 %   Abs Immature Granulocytes 0.16 (H) 0.00 - 0.07 K/uL    Comment: Performed at [facility], [address], [place], [place] [address]  Comprehensive metabolic panel     Status: Abnormal   Collection Time: 04/07/2019  9:46 PM  Result Value Ref Range   Sodium 140 135 - 145 mmol/L   Potassium 4.2 3.5 - 5.1 mmol/L   Chloride 101 98 - 111 mmol/L   CO2 29 22 - 32 mmol/L   Glucose, Bld 96 70 - 99 mg/dL   BUN 14 6 - 20 mg/dL   Creatinine, Ser 0.85 0.44 - 1.00 mg/dL   Calcium 9.1 8.9 - 10.3 mg/dL   Total Protein 6.9 6.5 - 8.1 g/dL   Albumin 2.4 (L) 3.5 - 5.0 g/dL   AST 49 (H) 15 - 41 U/L   ALT 27 0 - 44 U/L   Alkaline Phosphatase 145 (H) 38 - 126 U/L   Total Bilirubin 0.3 0.3 - 1.2 mg/dL   GFR calc non Af Amer >60 >60 mL/min   GFR calc Af Amer >60 >60 mL/min   Anion gap 10 5 - 15    Comment: Performed at [facility], [address], [place], [place] [address]  Blood culture  (routine x 2)     Status: None (Preliminary result)   Collection Time: 03/13/2019  9:46 PM   Specimen: A-Line; Blood  Result Value Ref Range   Specimen Description A-LINE    Special Requests      BOTTLES DRAWN AEROBIC AND ANAEROBIC Blood Culture adequate volume Performed at [facility], [address] [place], [place] [address]    Culture PENDING    Report Status PENDING   Blood culture (routine x 2)     Status: None (Preliminary result)   Collection Time: 03/17/2019  9:46 PM   Specimen: A-Line; Blood  Result Value Ref Range   Specimen  Description A-LINE    Special Requests      BOTTLES DRAWN AEROBIC AND ANAEROBIC Blood Culture adequate volume Performed at [facility], [address] [place], [place] [address]    Culture PENDING    Report Status PENDING   Lactic acid, plasma     Status: None   Collection Time: 04/06/2019  9:46 PM  Result Value Ref Range   Lactic Acid, Venous 1.7 0.5 - 1.9 mmol/L    Comment: Performed at [facility], [address] [place], [place] [address]  APTT     Status: None   Collection Time: 03/29/2019  9:46 PM  Result Value Ref Range   aPTT 26 24 - 36 seconds    Comment: Performed at [facility], [address] [place], [place] [address]  Protime-INR     Status: None   Collection Time: 04/05/2019  9:46 PM  Result Value Ref Range   Prothrombin Time 13.2 11.4 - 15.2 seconds   INR 1.0 0.8 - 1.2    Comment: (NOTE) INR goal varies based on device and disease states. Performed at [facility], [address], [place],  [address]   SARS Coronavirus 2 [facility] order, Performed in [facility] hospital lab) Nasopharyngeal Nasopharyngeal Swab     Status: None   Collection Time: 03/10/2019 11:25 PM   Specimen: Nasopharyngeal Swab  Result Value Ref Range   SARS Coronavirus 2 NEGATIVE NEGATIVE    Comment: (NOTE) If result is NEGATIVE SARS-CoV-2 target nucleic acids are NOT DETECTED. The SARS-CoV-2 RNA is generally detectable in upper and lower  respiratory  specimens during the acute phase of infection. The lowest  concentration of SARS-CoV-2 viral copies this assay can detect is 250  copies / mL. A negative result does not preclude SARS-CoV-2 infection  and should not be used as the sole basis for treatment or other  patient management decisions.  A negative result may occur with  improper specimen collection / handling, submission of specimen other  than nasopharyngeal swab, presence of viral mutation(s) within the  areas targeted by this assay, and inadequate number of viral copies  (<250 copies / mL). A negative result must be combined with clinical  observations, patient history, and epidemiological information. If result is POSITIVE SARS-CoV-2 target nucleic acids are DETECTED. The SARS-CoV-2 RNA is generally detectable in upper and lower  respiratory specimens dur ing the acute phase of infection.  Positive  results are indicative of active infection with SARS-CoV-2.  Clinical  correlation with patient history and other diagnostic information is  necessary to determine patient infection status.  Positive results do  not rule out bacterial infection or co-infection with other viruses. If result is PRESUMPTIVE POSTIVE SARS-CoV-2 nucleic acids MAY BE PRESENT.   A presumptive positive result was obtained on the submitted specimen  and confirmed on repeat testing.  While 2019 novel coronavirus  (SARS-CoV-2) nucleic acids may be present in the submitted sample  additional confirmatory testing may be necessary for epidemiological  and / or clinical management purposes  to differentiate between  SARS-CoV-2 and other Sarbecovirus currently known to infect humans.  If clinically indicated additional testing with an alternate test  methodology [phone]) is advised. The SARS-CoV-2 RNA is generally  detectable in upper and lower respiratory sp ecimens during the acute  phase of infection. The expected result is Negative. Fact Sheet for  Patients:  StrictlyIdeas.no Fact Sheet for Healthcare Providers: BankingDealers.co.za This test is not yet approved or cleared by the [place] FDA  and has been authorized for detection and/or diagnosis of SARS-CoV-2 by FDA under an Emergency Use Authorization (EUA).  This EUA will remain in effect (meaning this test can be used) for the duration of the COVID-19 declaration under Section 564(b)(1) of the Act, 21 U.S.C. section 360bbb-3(b)(1), unless the authorization is terminated or revoked sooner. Performed at [facility], [address], [place], [place] [address]     Chemistries  Recent Labs  Lab 03/14/2019 2146  NA 140  K 4.2  CL 101  CO2 29  GLUCOSE 96  BUN 14  CREATININE 0.85  CALCIUM 9.1  AST 49*  ALT 27  ALKPHOS 145*  BILITOT 0.3   ------------------------------------------------------------------------------------------------------------------  ------------------------------------------------------------------------------------------------------------------ GFR: Estimated Creatinine Clearance: 102.3 mL/min (by C-G formula based on SCr of 0.85 mg/dL). Liver Function Tests: Recent Labs  Lab 04/01/2019 2146  AST 49*  ALT 27  ALKPHOS 145*  BILITOT 0.3  PROT 6.9  ALBUMIN 2.4*   No results for input(s): LIPASE, AMYLASE in the last 168 hours. No results for input(s): AMMONIA in the last 168 hours. Coagulation Profile: Recent Labs  Lab 03/24/2019 2146  INR 1.0   Cardiac Enzymes: No results for input(s): CKTOTAL, CKMB, CKMBINDEX, TROPONINI in the last 168 hours. BNP (last 3 results) No results for input(s): PROBNP in the last 8760 hours. HbA1C: No results for input(s): HGBA1C in the last 72 hours. CBG: No results for input(s): GLUCAP in the last 168 hours. Lipid Profile: No results for input(s): CHOL, HDL, LDLCALC, TRIG, CHOLHDL, LDLDIRECT in the last 72 hours. Thyroid Function Tests: No results for  input(s): TSH, T4TOTAL, FREET4, T3FREE, THYROIDAB in the last 72 hours. Anemia Panel: No results for input(s): VITAMINB12, FOLATE, FERRITIN, TIBC, IRON, RETICCTPCT in the last 72 hours.  --------------------------------------------------------------------------------------------------------------- Urine analysis: No results found for: COLORURINE, APPEARANCEUR, LABSPEC, PHURINE, GLUCOSEU, HGBUR, BILIRUBINUR, KETONESUR, PROTEINUR, UROBILINOGEN, NITRITE, LEUKOCYTESUR    Imaging Results:    Ct Head Wo Contrast  Result Date: 04/09/2019 CLINICAL DATA:  Initial evaluation for acute altered mental status. EXAM: CT HEAD WITHOUT CONTRAST TECHNIQUE: Contiguous axial images were obtained from the base of the skull through the vertex without intravenous contrast. COMPARISON:  Comparison made with recent brain MRI from 03/30/2019. FINDINGS: Brain: Cerebral volume stable, and within normal limits for age. Previously question metastatic lesion involving the superior left cerebellum not visible by CT. No other discrete mass lesion, mass effect, or midline shift. No intracranial hemorrhage. No acute large vessel territory infarct. Ventricles normal size without hydrocephalus. No extra-axial fluid collection. Vascular: No hyperdense vessel. 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 Additional 6 mm soft tissue nodule at the right frontal scalp also unchanged. No other new focal osseous lesions. Sinuses/Orbits: Globes orbital soft tissues within normal limits. Mild scattered mucosal thickening within the sphenoid sinuses. Visualized paranasal sinuses are otherwise clear. Mastoid air cells and middle ear cavities are well pneumatized and free of fluid. Other: Malignant adenopathy partially visualized within the left supraclavicular fossa, better seen on recent neck CT. IMPRESSION: 1. No acute intracranial abnormality. 2. Known right parietal osseous  metastasis with overlying scalp involvement, grossly stable from previous. 3. Previously question left superficial cerebellar lesion not visible by CT. 4. Malignant adenopathy partially visualized within the left supraclavicular fossa, better seen on recent neck CT. Electronically Signed   By: Jeannine Boga M.D.   On: 04/09/2019 01:59   Ct Angio Chest Pe W And/or Wo Contrast  Result Date: 04/09/2019 CLINICAL DATA:  PE suspected, high pretest probability. Acute shortness of breath with exertion, receiving chemotherapy for breast cancer with possible brain metastases. Bucket handle EXAM: CT ANGIOGRAPHY CHEST WITH CONTRAST TECHNIQUE: Multidetector CT imaging of the chest was performed using the standard protocol during bolus administration of intravenous contrast. Multiplanar CT image reconstructions and MIPs were obtained to evaluate the vascular anatomy. CONTRAST:  151m OMNIPAQUE IOHEXOL 350 MG/ML SOLN COMPARISON:  CTA 03/09/2019 FINDINGS: Cardiovascular: Right internal jugular approach Port-A-Cath tip terminates at the right atrium. While there is satisfactory opacification of the central pulmonary arteries the study is markedly limited by both body habitus, streak from the patient's arms, and respiratory motion artifact which will limit detection of distal segmental and subsegmental pulmonary emboli. Furthermore, there is near complete collapse of the left lung which results and hypoventilatory vasoconstriction of the left pulmonary arteries. No central filling defect is identified. There is mass effect upon the right lower lobe central pulmonary artery by a large right hilar lymph node complex. Mediastinum/Nodes: Extensive pathologic adenopathy throughout the axillary mediastinal and hilar nodal stations, many of which have increased in size from comparison dated 03/09/2019. Reference nodes include: *Left supraclavicular lymph node (7/8), stable at 20 mm *Right axillary lymph node (7/31) increased to  35 mm, previously 32 mm. There is abrupt truncation of the central airways of the left lung (7/35) there is significant narrowing of the bronchus intermedius and right upper lobe airways, likely in part due to compressive affect by the right hilar adenopathy. The esophagus is unchanged from prior. Lungs/Pleura: There is progressive airlessness throughout the left lung with only few residual areas of air bronchograms within the left upper and lower lobes. There is heterogeneous enhancement of the atelectatic lung parenchyma as well as a thickened pleural rind seen anteriorly in the left hemithorax. A right pleural drain is in place, similarly positioned comparison exam. Small right effusion remains. Bandlike areas of atelectasis and scarring present in the right lung. Redemonstration of the large pulmonary nodules and masses throughout the aerated portions of the right lung. Reference lesions include: *Largest mass in the right middle lobe (7/45) measures 4.7 cm, enlarged from 3.7 cm on prior. *Subpleural right lower lobe mass (7/49) measures 3.7 cm, enlarged from 2.3 cm on prior. Upper Abdomen: Retrocrural adenopathy again noted. No other acute abnormalities present in the visualized portions of the upper abdomen. Musculoskeletal: Extensive circumferential body wall edema which is eccentric to the left chest wall and breast tissue. There is an enhancing lesion in the left pectoralis major measuring 3.6 cm which is is similar in size to comparison CT where this measured 3.7 cm. Furthermore there is increasing soft tissue nodularity along the medial right breast which is suspected tumor extending from the right pectoralis musculature. Innumerable sclerotic lesions are present throughout the imaged portions of the spine and appendicular skeleton. No acute pathologic fracture or other acute abnormality of the bones. Review of the MIP images confirms the above findings. IMPRESSION: 1. Markedly limited study due to both  body habitus, streak from the patient's arms, and respiratory motion artifact which will limit detection of distal segmental and subsegmental pulmonary emboli. No large central pulmonary embolus identified. 2. Extensive pathologic adenopathy throughout the bilateral axillary mediastinal and hilar nodal stations, many of which have increased in size from comparison CT dated 03/09/2019, consistent with progression of metastatic disease. There is mass effect upon the bronchus intermedius and right upper lobe airways, likely in part due to compressive affect by the right hilar adenopathy. 3. Worsening  airlessness throughout the left lung with only few residual areas of air bronchograms within the left upper and lower lobes. Persistent left pleural rind likely reflecting malignant involvement of the pleura 4. Extensive metastatic disease throughout the aerated portions of the right lung, many of which have increased in size from comparison dated 03/09/2019. Right pleural drain is in place, similarly positioned. Small right effusion remains. 5. Extensive osseous metastatic disease, similar to prior. 6. Extensive circumferential body wall edema which is eccentric to the left chest wall and breast tissue, concerning for lymphangitic spread of tumor. 7. Increasing soft tissue nodularity extending from the right pectoralis major towards the skin surface concerning for tumor involvement. Electronically Signed   By: Lovena Le M.D.   On: 04/09/2019 02:05   Dg Chest Portable 1 View  Result Date: 03/25/2019 CLINICAL DATA:  43 year old female with increasing shortness of breath and cough. Stage IV breast cancer. EXAM: PORTABLE CHEST 1 VIEW COMPARISON:  03/06/2019 FINDINGS: Complete opacification of LEFT hemithorax is noted. A RIGHT Port-A-Cath is present with tip overlying the RIGHT atrium. Decreased airspace opacities within the RIGHT lung noted with RIGHT basilar atelectasis/consolidation. There is no evidence of  pneumothorax. No acute bony abnormalities are identified. A sclerotic lesion within the distal RIGHT clavicle is again noted. IMPRESSION: Complete opacification of the LEFT hemithorax which may represent a combination of airspace disease/consolidation/effusion. Increasing RIGHT lung airspace disease and RIGHT basilar atelectasis/consolidation. Electronically Signed   By: Margarette Canada M.D.   On: 03/29/2019 21:39    My personal review of EKG: Rhythm sinus tachycardia, Rate 125/min, QTc 424,no Acute ST changes   Assessment & Plan:   1. Sepsis secondary to pneumonia Sepsis order set utilized.  - Meets 2 or more SIRS criteria with heart rate of 125 respiratory rate above 30 - This patient is at high risk of poor outcomes - Source thought to be from pneumonia - Send blood, urine and sputum cultures.  -There is no leukocytosis and lactic acid is 1.7. Check pro-calcitoninin.  - Fluid volume resuscitate with 30 mg/kg using weight based algorithm per sepsis order set.  - Start targeted antibiotics with ceftriaxone and Zithromax, based on suspected source of infection. 2. Community-acquired pneumonia 1. Continue ceftriaxone and Zithromax 2. DuoNebs every 2 as needed wheezing and shortness of breath 3. Sputum culture ordered 4. Nasal cannula titrate chin to keep oxygen saturation above 90 3. Acute hypoxic respiratory failure 1. Reported oxygen saturations in the 80s at home 2. Check ABG 3. Continue supplemental oxygen therapy via nasal cannula 4. Admit to stepdown unit due to work of breathing and the risk of decompensation 4. 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 Patient follows with oncology 2. Consult palliative care 5. Elevated alkaline phosphatase 1. Likely due to bone mets 6. Elevated AST 1. Continue to monitor with daily CMP 7. Protein calorie malnutrition 1. Advised patient about the importance of nutrient dense foods   DVT Prophylaxis-   Lovenox - SCDs   AM  Labs Ordered, also please review Full Orders  Family Communication: Admission, patients condition and plan of care including tests being ordered have been discussed with the patient who verbalizes understanding and agrees to CODE STATUS Code Status: Full  Admission status: Inpatient: Based on patients clinical presentation and evaluation of above clinical data, I have made determination that patient meets Inpatient criteria at this time.  Time spent in minutes : 67   Rolla Plate M.D on 04/09/2019 at 4:46 AM

## 2019-04-09 NOTE — Progress Notes (Signed)
Initial Nutrition Assessment  DOCUMENTATION CODES:   Morbid obesity  INTERVENTION:  Based on care goal progression; if tube feeding desired:  -Vital High Protein @ 20 ml/hr via OGT    -60 ml Prostat TID  Tube feeding regimen provides 1080 kcal, 132 gr protein, and 389 ml of H2O.    Sedative contributing additional -869 kcal q 24 hr  NUTRITION DIAGNOSIS:   Inadequate oral intake related to inability to eat, acute illness(respiratory failure, sepsis, pneumonia) as evidenced by NPO status(intubated/sedated).  GOAL:   Provide needs based on ASPEN/SCCM guidelines MONITOR:   Vent status, Labs, Weight trends, I & O's, TF tolerance  REASON FOR ASSESSMENT:   Malnutrition Screening Tool, Ventilator    ASSESSMENT: Patient is a 43 yo female with stage 4-breast cancer with metastasis. She presents septic with pneumonia and respiratory distress. Intubated, sedated with poor prognosis. Palliative medicine following. Currently a full code. Discussed pt with her nurse.   Ventilator support MV: 12.2  L/min Temp (24hrs), Avg:99 F (37.2 C), Min:98.4 F (36.9 C), Max:99.6 F (37.6 C)  Propofol: 32.9 ml/hr provides-869 kcal q 24 hr at current rate.  Medications reviewed and include: solumedrol, protonix. Fentanyl.   Intake/Output Summary (Last 24 hours) at 04/09/2019 1315 Last data filed at 04/09/2019 1102 Gross per 24 hour  Intake 1221.54 ml  Output -  Net 1221.54 ml    Weight hx: review shows gain of 9 kg compared to 10 days ago.  Labs: BMP-wnl BMP Latest Ref Rng & Units 04/09/2019 04/02/2019 03/29/2019  Glucose 70 - 99 mg/dL 99 96 95  BUN 6 - 20 mg/dL 13 14 12   Creatinine 0.44 - 1.00 mg/dL 0.86 0.85 0.91  Sodium 135 - 145 mmol/L 139 140 136  Potassium 3.5 - 5.1 mmol/L 4.0 4.2 4.7  Chloride 98 - 111 mmol/L 100 101 97(L)  CO2 22 - 32 mmol/L 28 29 34(H)  Calcium 8.9 - 10.3 mg/dL 9.0 9.1 8.7(L)     NUTRITION - FOCUSED PHYSICAL EXAM:  Unable to complete Nutrition-Focused  physical exam at this time.     Diet Order:   Diet Order            Diet NPO time specified  Diet effective now              EDUCATION NEEDS:   Not appropriate for education at this time   Skin:  Skin Assessment: Reviewed RN Assessment  Last BM:  8/31  Height:   Ht Readings from Last 1 Encounters:  04/09/19 5\' 4"  (1.626 m)    Weight:   Wt Readings from Last 1 Encounters:  03/15/2019 109.8 kg    Ideal Body Weight:  55 kg  BMI:  Body mass index is 41.54 kg/m.  Estimated Nutritional Needs:   Kcal:  2025(based on 101 kg [employer])  Protein:  135-140 gr protein  Fluid:  per MD goal   Colman Cater MS,RD,CSG,LDN Office: [phone] Pager: [phone]

## 2019-04-09 NOTE — Progress Notes (Signed)
Ok to use OG tube per Dr. Luan Pulling

## 2019-04-09 NOTE — Progress Notes (Signed)
Patient's mother, Tanija Kerst), took patient belonging home with her.

## 2019-04-09 NOTE — Progress Notes (Signed)
Per HPI: Sharon Floyd  is a 43 y.o. 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  Patient reports that she wears 3 L nasal cannula at home.  She started feeling increased shortness of breath last night.  She also had an increase in her productive cough with thick secretions.  Patient denies hemoptysis.  Patient reports that she increased her nasal cannula at home and had no improvement in symptoms.  She does follow with Dr. Maylon Peppers reports that her last appointment was last week.  Patient denies any pain at this time.  Due to patient's work of breathing she was not able to give a more extensive history.  Remainder of history is taken from the ER medical record.  Patient did admit to a low-grade fever at home to the ER provider.  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  Family had reported that the patient has been acting more fatigued and worn out recently.  Patient denies any chest pain vomiting dysuria or known COVID exposures.  COVID test is negative.  Patient insists on remaining a full code.  We discussed the potential complications of a prolonged code.  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  She reports that she has a young son at home, and is not prepared to be a DNR.  In the event that she is not able to make decisions she wants her mother to make them for her contact info is listed below.  Patient was admitted with sepsis secondary to community-acquired pneumonia and started on Rocephin and Zithromax.  She was noted to have acute hypoxemic respiratory failure and was started on BiPAP and placed on the stepdown unit.  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  Patient and family members, unfortunately do not appear to have realistic goals and expectations from this hospitalization and palliative care has also been consulted to follow-up.   Unfortunately, patient was noted to have worsening respiratory distress and was given some Ativan for anxiety and is also having worsening mentation this morning.  She appears to be failing the trial of BiPAP and requires urgent intubation.  I had called anesthesia for intubation this morning and she has successfully been intubated and is currently on propofol for sedation, but likely requires additional sedation.  [facility] pulmonology consultation with broadening the spectrum of antibiotics to vancomycin and Zosyn for now and with the addition of Solu-Medrol.  Discussed care with patient's mother and emphasized that I feel that there will not be a good prognosis or outcome from this hospitalization.  Appreciate further palliative care follow-up and discussions with family.  Continue to monitor progress.  Total critical care time: 40 minutes.

## 2019-04-09 NOTE — TOC Initial Note (Signed)
Transition of Care [facility]) - Initial/Assessment Note    Patient Details  Name: Sharon Floyd MRN: [id number] Date of Birth: [birth year]  [facility] CM/SW Contact:    Shade Flood, LCSW Phone Number: 04/09/2019, 10:52 AM  Clinical Narrative:                  Pt admitted from home. She has metastatic breast cancer and currently is septic and has pneumonia. Per MD, pt has poor prognosis. He discussed DNR with pt who wanted to remain full code. She indicated that she would want her mother to make decisions if she is unable. Pt is currently intubated at mother's request.   Palliative is consulted. TOC will follow and assist as needed.   Barriers to Discharge: Continued Medical Work up   Patient Goals and CMS Choice        Expected Discharge Plan and Services         Living arrangements for the past 2 months: Single Family Home                                      Prior Living Arrangements/Services Living arrangements for the past 2 months: Single Family Home Lives with:: Parents Patient language and need for interpreter reviewed:: Yes        Need for Family Participation in Patient Care: Yes (Comment) Care giver support system in place?: Yes (comment) Current home services: DME Criminal Activity/Legal Involvement Pertinent to Current Situation/Hospitalization: No - Comment as needed  Activities of Daily Living Home Assistive Devices/Equipment: Oxygen ADL Screening (condition at time of admission) Patient's cognitive ability adequate to safely complete daily activities?: Yes Is the patient deaf or have difficulty hearing?: No Does the patient have difficulty seeing, even when wearing glasses/contacts?: No Does the patient have difficulty concentrating, remembering, or making decisions?: No Patient able to express need for assistance with ADLs?: Yes Does the patient have difficulty dressing or bathing?: Yes Independently performs ADLs?: Yes  (appropriate for developmental age) Does the patient have difficulty walking or climbing stairs?: Yes Weakness of Legs: None Weakness of Arms/Hands: None  Permission Sought/Granted                  Emotional Assessment Appearance:: Appears stated age Attitude/Demeanor/Rapport: Unable to Assess Affect (typically observed): Unable to Assess Orientation: : Oriented to Self, Oriented to Place, Oriented to  Time, Oriented to Situation Alcohol / Substance Use: Not Applicable Psych Involvement: No (comment)  Admission diagnosis:  shortness of breath Patient Active Problem List   Diagnosis Date Noted  . Sepsis ([place]) 04/09/2019  . Sepsis due to pneumonia ([place]) 04/09/2019  . Pneumonia due to infectious organism   . Thrombocytosis ([place])   . Antineoplastic chemotherapy induced anemia   . Acute on chronic respiratory failure with hypoxia ([place]) 03/06/2019  . Genetic testing 11/16/2018  . Normocytic anemia 11/03/2018  . Goals of care, counseling/discussion 11/03/2018  . Cancer-related pain 10/24/2018  . Metastasis to brain ([place])   . Pleural effusion, malignant 10/18/2018  . Elevated troponin 10/18/2018  . Malignant neoplasm of left female breast ([place])   . Acute respiratory failure with hypoxia ([place]) 10/17/2018  . Family history of breast cancer   . Family history of colon cancer   . Family history of cancer 10/12/2018  . Metastatic breast cancer ([place]) 10/10/2018   PCP:  Baruch Gouty, FNP Pharmacy:   [id number] -  [place], [place] [address] [place] [address] Phone: [phone] Fax: [phone]     Social Determinants of Health (SDOH) Interventions    Readmission Risk Interventions Readmission Risk Prevention Plan 04/09/2019  Transportation Screening Complete  Palliative Care Screening Complete  Medication Review (RN Care Manager) Complete

## 2019-04-09 NOTE — Progress Notes (Signed)
Total urine output since 7am has been 168ml. Dr Manuella Ghazi aware.

## 2019-04-09 NOTE — Progress Notes (Signed)
Lab called stated abg had clot , blood gas will be delayed till patient redrawn.

## 2019-04-09 NOTE — Anesthesia Procedure Notes (Signed)
Procedure Name: Intubation Date/Time: 04/09/2019 7:37 AM Performed by: Lenice Llamas, MD Pre-anesthesia Checklist: Patient identified, Patient being monitored, Timeout performed, Emergency Drugs available and Suction available Patient Re-evaluated:Patient Re-evaluated prior to induction Oxygen Delivery Method: Circle System Utilized Preoxygenation: Pre-oxygenation with 100% oxygen Induction Type: IV induction, Rapid sequence and Cricoid Pressure applied Laryngoscope Size: Mac and 4 Grade View: Grade I Tube type: Oral Tube size: 7.0 mm Number of attempts: 1 Airway Equipment and Method: Stylet Placement Confirmation: ETT inserted through vocal cords under direct vision,  positive ETCO2 and breath sounds checked- equal and bilateral Secured at: 23 cm Tube secured with: Tape Dental Injury: Teeth and Oropharynx as per pre-operative assessment  Comments: Easy ETT x1 , VSS , Dr. Jackquline Bosch at bedside , manage per Dr. Brigitte Pulse and ICU

## 2019-04-10 ENCOUNTER — Other Ambulatory Visit: Payer: BC Managed Care – PPO

## 2019-04-10 ENCOUNTER — Ambulatory Visit: Payer: BC Managed Care – PPO | Admitting: Hematology

## 2019-04-10 ENCOUNTER — Inpatient Hospital Stay: Payer: BC Managed Care – PPO | Admitting: Hematology

## 2019-04-10 ENCOUNTER — Inpatient Hospital Stay: Payer: BC Managed Care – PPO

## 2019-04-10 ENCOUNTER — Inpatient Hospital Stay (HOSPITAL_COMMUNITY): Payer: BC Managed Care – PPO

## 2019-04-10 DIAGNOSIS — Z515 Encounter for palliative care: Secondary | ICD-10-CM

## 2019-04-10 DIAGNOSIS — J9601 Acute respiratory failure with hypoxia: Secondary | ICD-10-CM

## 2019-04-10 DIAGNOSIS — C50919 Malignant neoplasm of unspecified site of unspecified female breast: Secondary | ICD-10-CM

## 2019-04-10 DIAGNOSIS — Z7189 Other specified counseling: Secondary | ICD-10-CM

## 2019-04-10 LAB — BLOOD GAS, ARTERIAL
Acid-Base Excess: 2.9 mmol/L — ABNORMAL HIGH (ref 0.0–2.0)
Bicarbonate: 26.6 mmol/L (ref 20.0–28.0)
Drawn by: 38235
FIO2: 50
MECHVT: 440 mL
O2 Saturation: 97.3 %
PEEP: 5 cmH2O
Patient temperature: 37
RATE: 20 resp/min
pCO2 arterial: 49.2 mmHg — ABNORMAL HIGH (ref 32.0–48.0)
pH, Arterial: 7.37 (ref 7.350–7.450)
pO2, Arterial: 112 mmHg — ABNORMAL HIGH (ref 83.0–108.0)

## 2019-04-10 LAB — CBC
HCT: 33.2 % — ABNORMAL LOW (ref 36.0–46.0)
Hemoglobin: 9.5 g/dL — ABNORMAL LOW (ref 12.0–15.0)
MCH: 26 pg (ref 26.0–34.0)
MCHC: 28.6 g/dL — ABNORMAL LOW (ref 30.0–36.0)
MCV: 91 fL (ref 80.0–100.0)
Platelets: 554 10*3/uL — ABNORMAL HIGH (ref 150–400)
RBC: 3.65 MIL/uL — ABNORMAL LOW (ref 3.87–5.11)
RDW: 18.7 % — ABNORMAL HIGH (ref 11.5–15.5)
WBC: 10 10*3/uL (ref 4.0–10.5)
nRBC: 0 % (ref 0.0–0.2)

## 2019-04-10 LAB — COMPREHENSIVE METABOLIC PANEL
ALT: 30 U/L (ref 0–44)
AST: 56 U/L — ABNORMAL HIGH (ref 15–41)
Albumin: 2.1 g/dL — ABNORMAL LOW (ref 3.5–5.0)
Alkaline Phosphatase: 197 U/L — ABNORMAL HIGH (ref 38–126)
Anion gap: 12 (ref 5–15)
BUN: 15 mg/dL (ref 6–20)
CO2: 26 mmol/L (ref 22–32)
Calcium: 8.7 mg/dL — ABNORMAL LOW (ref 8.9–10.3)
Chloride: 101 mmol/L (ref 98–111)
Creatinine, Ser: 0.89 mg/dL (ref 0.44–1.00)
GFR calc Af Amer: >60 mL/min (ref >60)
GFR calc non Af Amer: >60 mL/min (ref >60)
Glucose, Bld: 108 mg/dL — ABNORMAL HIGH (ref 70–99)
Potassium: 4.6 mmol/L (ref 3.5–5.1)
Sodium: 139 mmol/L (ref 135–145)
Total Bilirubin: 0.3 mg/dL (ref 0.3–1.2)
Total Protein: 6.6 g/dL (ref 6.5–8.1)

## 2019-04-10 LAB — GLUCOSE, CAPILLARY
Glucose-Capillary: 101 mg/dL — ABNORMAL HIGH (ref 70–99)
Glucose-Capillary: 105 mg/dL — ABNORMAL HIGH (ref 70–99)
Glucose-Capillary: 106 mg/dL — ABNORMAL HIGH (ref 70–99)
Glucose-Capillary: 112 mg/dL — ABNORMAL HIGH (ref 70–99)
Glucose-Capillary: 124 mg/dL — ABNORMAL HIGH (ref 70–99)
Glucose-Capillary: 97 mg/dL (ref 70–99)

## 2019-04-10 LAB — TRIGLYCERIDES: Triglycerides: 259 mg/dL — ABNORMAL HIGH (ref <150)

## 2019-04-10 LAB — URINE CULTURE

## 2019-04-10 LAB — LACTIC ACID, PLASMA: Lactic Acid, Venous: 1.8 mmol/L (ref 0.5–1.9)

## 2019-04-10 MED ORDER — VITAL HIGH PROTEIN PO LIQD
1000.0000 mL | ORAL | Status: DC
  Administered 2019-04-10 – 2019-04-11 (×2): 1000 mL

## 2019-04-10 MED ORDER — PRO-STAT SUGAR FREE PO LIQD
60.0000 mL | Freq: Three times a day (TID) | ORAL | Status: DC
  Administered 2019-04-10: 60 mL via ORAL
  Filled 2019-04-10: qty 60

## 2019-04-10 MED ORDER — PRO-STAT SUGAR FREE PO LIQD
60.0000 mL | Freq: Three times a day (TID) | ORAL | Status: DC
  Administered 2019-04-10 – 2019-04-11 (×4): 60 mL
  Filled 2019-04-10 (×4): qty 60

## 2019-04-10 MED ORDER — VITAL HIGH PROTEIN PO LIQD
1000.0000 mL | ORAL | Status: DC
  Administered 2019-04-10: 1000 mL

## 2019-04-10 MED ORDER — FENTANYL BOLUS VIA INFUSION
50.0000 ug | INTRAVENOUS | Status: DC | PRN
  Administered 2019-04-10: 11:00:00 50 ug via INTRAVENOUS
  Administered 2019-04-10 (×2): 100 ug via INTRAVENOUS
  Administered 2019-04-10: 50 ug via INTRAVENOUS
  Administered 2019-04-11 (×3): 100 ug via INTRAVENOUS
  Administered 2019-04-11: 50 ug via INTRAVENOUS
  Administered 2019-04-12: 100 ug via INTRAVENOUS
  Filled 2019-04-10: qty 200

## 2019-04-10 NOTE — Progress Notes (Signed)
PT urine output 250 mL all shift. MD aware of decreased output. Continue to monitor.

## 2019-04-10 NOTE — Progress Notes (Signed)
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 Patient reports that she wears 3 L nasal cannula at home. She started feeling increased shortness of breath last night. She also had an increase in her productive cough with thick secretions. Patient denies hemoptysis. Patient reports that she increased her nasal cannula at home and had no improvement in symptoms. She does follow with Dr. Lilian Coma that her last appointment was last week. Patient denies any pain at this time. Due to patient's work of breathing she was not able to give a more extensive history. Remainder of history is taken from the ER medical record. Patient did admit to a low-grade fever at home to the ER provider. 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 Family had reported that the patient has been acting more fatigued and worn out recently. Patient denies any chest pain vomiting dysuria or known COVID exposures. COVID test is negative. Patient insists on remaining a full code. We discussed the potential complications of a prolonged code. 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 She reports that she has a young son at home, and is not prepared to be a DNR. In the event that she is not able to make decisions she wants her mother to make them for her contact info is listed below.  Patient was admitted with sepsis secondary to community-acquired pneumonia and started on Rocephin and Zithromax.  She was noted to have acute hypoxemic respiratory failure and was started on BiPAP and placed on the stepdown unit.  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  Patient and family members, unfortunately do not appear to  have realistic goals and expectations from this hospitalization and palliative care has also been consulted to follow-up.  Unfortunately, patient was noted to have worsening respiratory distress and was given some Ativan for anxiety and is also having worsening mentation this morning.  She appears to be failing the trial of BiPAP and requires urgent intubation.  9/1: Patient is status post intubation on 8/31 and was transitioned to vancomycin and Zosyn with initiation of IV steroids on the same day.  Palliative consultation still pending this morning.  She does appear comfortable with the addition of fentanyl drip which was also started on 8/31.  Oncology note appreciated with further discussion had with the mother regarding a likely futile effort which may lead to unnecessary suffering given extensive metastatic disease.  Continue current support measures and appreciate palliative consultation.  Nutrition consulted to assist with feedings today.  Assessment & Plan:   Active Problems:   Sepsis ([place])   Sepsis due to pneumonia ([place])   Sepsis secondary to pneumonia -Continue aggressive IV fluid -Blood cultures negative x2 days -Continue empirically on vancomycin and Zosyn, patient noted to have positive MRSA nares -No signs of UTI on UA and urine cultures with multiple species noted -Appreciate palliative care consultation as more than likely, patient will not survive this hospitalization  Acute hypoxemic respiratory failure secondary to above status post intubation -Appreciate ongoing pulmonology recommendations -Continue IV steroids as ordered -Continue breathing treatments as needed -Continue on fentanyl and propofol for sedation  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 Maylon Peppers on 8/31  Elevated alkaline phosphatase/AST -Likely secondary to metastatic disease -Follow  CMP  Obesity Estimated body mass index is 36.4 kg/m as calculated from the following:   Height as of this encounter: _0  (1.626 m).   Weight as of this encounter: 96.2 kg.  -Ordered nutrition consult for tube feeding initiation on 9/1   DVT prophylaxis: Lovenox Code Status: Full Family Communication: With patient's mother Disposition Plan: Continue treatment of sepsis/pneumonia with IV antibiotics as ordered.  Maintain on ventilatory support.  Palliative consultation pending.  Very poor prognosis.   Consultants:   Pulmonology  Palliative care  Procedures:   Urgent endotracheal intubation on 8/31  Antimicrobials:  Anti-infectives (From admission, onward)   Start     Dose/Rate Route Frequency Ordered Stop   04/09/19 1800  vancomycin (VANCOCIN) IVPB 1000 mg/200 mL premix     1,000 mg 200 mL/hr over 60 Minutes Intravenous Every 8 hours 04/09/19 1025     04/09/19 1600  piperacillin-tazobactam (ZOSYN) IVPB 3.375 g     3.375 g 12.5 mL/hr over 240 Minutes Intravenous Every 8 hours 04/09/19 0942     04/09/19 1000  vancomycin (VANCOCIN) IVPB 1000 mg/200 mL premix     1,000 mg 200 mL/hr over 60 Minutes Intravenous  Once 04/09/19 0812 04/09/19 1032   04/09/19 0900  vancomycin (VANCOCIN) IVPB 1000 mg/200 mL premix     1,000 mg 200 mL/hr over 60 Minutes Intravenous  Once 04/09/19 0812 04/09/19 2210   04/09/19 0830  piperacillin-tazobactam (ZOSYN) IVPB 3.375 g     3.375 g 100 mL/hr over 30 Minutes Intravenous  Once 04/09/19 0811 04/09/19 0854   04/09/19 0545  cefTRIAXone (ROCEPHIN) 2 g in sodium chloride 0.9 % 100 mL IVPB  Status:  Discontinued     2 g 200 mL/hr over 30 Minutes Intravenous Every 24 hours 04/09/19 0541 04/09/19 0542   04/09/19 0545  azithromycin (ZITHROMAX) 500 mg in sodium chloride 0.9 % 250 mL IVPB  Status:  Discontinued     500 mg 250 mL/hr over 60 Minutes Intravenous Every 24 hours 04/09/19 0541 04/09/19 0542   03/10/2019 2215  cefTRIAXone (ROCEPHIN) 2 g in  sodium chloride 0.9 % 100 mL IVPB  Status:  Discontinued     2 g 200 mL/hr over 30 Minutes Intravenous Every 24 hours 03/13/2019 2204 04/09/19 0757   03/19/2019 2215  azithromycin (ZITHROMAX) 500 mg in sodium chloride 0.9 % 250 mL IVPB  Status:  Discontinued     500 mg 250 mL/hr over 60 Minutes Intravenous Every 24 hours 04/04/2019 2204 04/09/19 0757       Subjective: Patient seen and evaluated today with no new issues noted overnight.  She appears to be resting comfortably on the ventilator.  Objective: Vitals:   04/10/19 0745 04/10/19 0800 04/10/19 0815 04/10/19 0830  BP: 112/67 103/86 114/65 121/70  Pulse: 100 (!) 104 (!) 102 (!) 103  Resp: (!) 24 (!) 24 (!) 28 (!) 29  Temp:      TempSrc:      SpO2: 98% 99% 98% 99%  Weight:      Height:        Intake/Output Summary (Last 24 hours) at 04/10/2019 0925 Last data filed at 04/10/2019 1219 Gross per 24 hour  Intake 2758.07 ml  Output 600 ml  Net 2158.07 ml   Filed Weights   04/09/2019 2051 04/10/19 0430  Weight: 109.8 kg 96.2 kg    Examination:  General exam: Appears calm  and comfortable, obese Respiratory system: Clear to auscultation. Respiratory effort normal.  Intubated on mechanical ventilation with FiO2 50%. Cardiovascular system: S1 & S2 heard, RRR. No JVD, murmurs, rubs, gallops or clicks. No pedal edema. Gastrointestinal system: Abdomen is nondistended, soft and nontender. No organomegaly or masses felt. Normal bowel sounds heard. Central nervous system: Sedated on ventilator Extremities: No significant edema Skin: No rashes, lesions or ulcers Psychiatry: Cannot be assessed due to patient condition.    Data Reviewed: I have personally reviewed following labs and imaging studies  CBC: Recent Labs  Lab 03/27/2019 2146 04/09/19 0721 04/10/19 0426  WBC 9.7 9.3 10.0  NEUTROABS 8.1*  --   --   HGB 10.5* 10.2* 9.5*  HCT 36.3 36.3 33.2*  MCV 89.6 90.3 91.0  PLT 548* 517* 161*   Basic Metabolic Panel: Recent Labs   Lab 03/12/2019 2146 04/09/19 0721 04/10/19 0426  NA 140 139 139  K 4.2 4.0 4.6  CL 101 100 101  CO2 _0 GLUCOSE 96 99 108*  BUN _1 CREATININE 0.85 0.86 0.89  CALCIUM 9.1 9.0 8.7*  MG  --  2.2  --    GFR: Estimated Creatinine Clearance: 90.8 mL/min (by C-G formula based on SCr of 0.89 mg/dL). Liver Function Tests: Recent Labs  Lab 03/13/2019 2146 04/09/19 0721 04/10/19 0426  AST 49* 46* 56*  ALT _2 ALKPHOS 145* 142* 197*  BILITOT 0.3 0.2* 0.3  PROT 6.9 7.0 6.6  ALBUMIN 2.4* 2.3* 2.1*   No results for input(s): LIPASE, AMYLASE in the last 168 hours. No results for input(s): AMMONIA in the last 168 hours. Coagulation Profile: Recent Labs  Lab 03/11/2019 2146 04/09/19 0721  INR 1.0 1.1   Cardiac Enzymes: No results for input(s): CKTOTAL, CKMB, CKMBINDEX, TROPONINI in the last 168 hours. BNP (last 3 results) No results for input(s): PROBNP in the last 8760 hours. HbA1C: No results for input(s): HGBA1C in the last 72 hours. CBG: Recent Labs  Lab 04/09/19 1646 04/09/19 1944 04/10/19 0004 04/10/19 0405 04/10/19 0734  GLUCAP 119* 104* 106* 112* 105*   Lipid Profile: Recent Labs    04/10/19 0426  TRIG 259*   Thyroid Function Tests: No results for input(s): TSH, T4TOTAL, FREET4, T3FREE, THYROIDAB in the last 72 hours. Anemia Panel: No results for input(s): VITAMINB12, FOLATE, FERRITIN, TIBC, IRON, RETICCTPCT in the last 72 hours. Sepsis Labs: Recent Labs  Lab 04/03/2019 2146 04/09/19 0721 04/10/19 0426  PROCALCITON  --  0.10  --   LATICACIDVEN 1.7  --  1.8    Recent Results (from the past 240 hour(s))  Urine culture     Status: Abnormal   Collection Time: 03/18/2019  9:24 PM   Specimen: Urine, Catheterized  Result Value Ref Range Status   Specimen Description   Final    URINE, CATHETERIZED Performed at [facility], [address], [place], [place] [address]    Special Requests   Final    NONE Performed at [facility], [address] [place], [place] [address]    Culture MULTIPLE SPECIES PRESENT, SUGGEST RECOLLECTION (A)  Final   Report Status 04/10/2019 FINAL  Final  Blood culture (routine x 2)     Status: None (Preliminary result)   Collection Time: 04/09/2019  9:46 PM   Specimen: A-Line; Blood  Result Value Ref Range Status   Specimen Description A-LINE  Final   Special Requests   Final    BOTTLES DRAWN AEROBIC AND  ANAEROBIC Blood Culture adequate volume   Culture   Final    NO GROWTH 2 DAYS Performed at [facility], [address] [place], [place] [address]    Report Status PENDING  Incomplete  Blood culture (routine x 2)     Status: None (Preliminary result)   Collection Time: 03/11/2019  9:46 PM   Specimen: A-Line; Blood  Result Value Ref Range Status   Specimen Description A-LINE  Final   Special Requests   Final    BOTTLES DRAWN AEROBIC AND ANAEROBIC Blood Culture adequate volume   Culture   Final    NO GROWTH 2 DAYS Performed at [facility], [address] [place], [place] [address]    Report Status PENDING  Incomplete  SARS Coronavirus 2 [facility] order, Performed in [place] hospital lab) Nasopharyngeal Nasopharyngeal Swab     Status: None   Collection Time: 04/05/2019 11:25 PM   Specimen: Nasopharyngeal Swab  Result Value Ref Range Status   SARS Coronavirus 2 NEGATIVE NEGATIVE Final    Comment: (NOTE) If result is NEGATIVE SARS-CoV-2 target nucleic acids are NOT DETECTED. The SARS-CoV-2 RNA is generally detectable in upper and lower  respiratory specimens during the acute phase of infection. The lowest  concentration of SARS-CoV-2 viral copies this assay can detect is 250  copies / mL. A negative result does not preclude SARS-CoV-2 infection  and should not be used as the sole basis for treatment or other  patient management decisions.  A negative result may occur with  improper specimen collection / handling, submission of specimen other  than nasopharyngeal swab, presence of viral  mutation(s) within the  areas targeted by this assay, and inadequate number of viral copies  (<250 copies / mL). A negative result must be combined with clinical  observations, patient history, and epidemiological information. If result is POSITIVE SARS-CoV-2 target nucleic acids are DETECTED. The SARS-CoV-2 RNA is generally detectable in upper and lower  respiratory specimens dur ing the acute phase of infection.  Positive  results are indicative of active infection with SARS-CoV-2.  Clinical  correlation with patient history and other diagnostic information is  necessary to determine patient infection status.  Positive results do  not rule out bacterial infection or co-infection with other viruses. If result is PRESUMPTIVE POSTIVE SARS-CoV-2 nucleic acids MAY BE PRESENT.   A presumptive positive result was obtained on the submitted specimen  and confirmed on repeat testing.  While 2019 novel coronavirus  (SARS-CoV-2) nucleic acids may be present in the submitted sample  additional confirmatory testing may be necessary for epidemiological  and / or clinical management purposes  to differentiate between  SARS-CoV-2 and other Sarbecovirus currently known to infect humans.  If clinically indicated additional testing with an alternate test  methodology [phone]) is advised. The SARS-CoV-2 RNA is generally  detectable in upper and lower respiratory sp ecimens during the acute  phase of infection. The expected result is Negative. Fact Sheet for Patients:  StrictlyIdeas.no Fact Sheet for Healthcare Providers: BankingDealers.co.za This test is not yet approved or cleared by the [place] FDA and has been authorized for detection and/or diagnosis of SARS-CoV-2 by FDA under an Emergency Use Authorization (EUA).  This EUA will remain in effect (meaning this test can be used) for the duration of the COVID-19 declaration under Section 564(b)(1)  of the Act, 21 U.S.C. section 360bbb-3(b)(1), unless the authorization is terminated or revoked sooner. Performed at [facility], [address], [place],  [address]  MRSA PCR Screening     Status: Abnormal   Collection Time: 04/09/19  6:18 AM   Specimen: Nasal Mucosa; Nasopharyngeal  Result Value Ref Range Status   MRSA by PCR POSITIVE (A) NEGATIVE Final    Comment:        The GeneXpert MRSA Assay (FDA approved for NASAL specimens only), is one component of a comprehensive MRSA colonization surveillance program. It is not intended to diagnose MRSA infection nor to guide or monitor treatment for MRSA infections. CALLED TO [facility] SMITH AT 0947 BY HFLYNT 04/09/19 Performed at [facility], [address], [place], [place] [address]          Radiology Studies: Ct Head Wo Contrast  Result Date: 04/09/2019 CLINICAL DATA:  Initial evaluation for acute altered mental status. EXAM: CT HEAD WITHOUT CONTRAST TECHNIQUE: Contiguous axial images were obtained from the base of the skull through the vertex without intravenous contrast. COMPARISON:  Comparison made with recent brain MRI from 03/30/2019. FINDINGS: Brain: Cerebral volume stable, and within normal limits for age. Previously question metastatic lesion involving the superior left cerebellum not visible by CT. No other discrete mass lesion, mass effect, or midline shift. No intracranial hemorrhage. No acute large vessel territory infarct. Ventricles normal size without hydrocephalus. No extra-axial fluid collection. Vascular: No hyperdense vessel. 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 Additional 6 mm soft tissue nodule at the right frontal scalp also unchanged. No other new focal osseous lesions. Sinuses/Orbits: Globes orbital soft tissues within normal limits. Mild scattered mucosal thickening within the sphenoid sinuses. Visualized paranasal sinuses  are otherwise clear. Mastoid air cells and middle ear cavities are well pneumatized and free of fluid. Other: Malignant adenopathy partially visualized within the left supraclavicular fossa, better seen on recent neck CT. IMPRESSION: 1. No acute intracranial abnormality. 2. 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 3. Previously question left superficial cerebellar lesion not visible by CT. 4. Malignant adenopathy partially visualized within the left supraclavicular fossa, better seen on recent neck CT. Electronically Signed   By: Jeannine Boga M.D.   On: 04/09/2019 01:59   Ct Angio Chest Pe W And/or Wo Contrast  Result Date: 04/09/2019 CLINICAL DATA:  PE suspected, high pretest probability. Acute shortness of breath with exertion, receiving chemotherapy for breast cancer with possible brain metastases. Bucket handle EXAM: CT ANGIOGRAPHY CHEST WITH CONTRAST TECHNIQUE: Multidetector CT imaging of the chest was performed using the standard protocol during bolus administration of intravenous contrast. Multiplanar CT image reconstructions and MIPs were obtained to evaluate the vascular anatomy. CONTRAST:  120m OMNIPAQUE IOHEXOL 350 MG/ML SOLN COMPARISON:  CTA 03/09/2019 FINDINGS: Cardiovascular: Right internal jugular approach Port-A-Cath tip terminates at the right atrium. While there is satisfactory opacification of the central pulmonary arteries the study is markedly limited by both body habitus, streak from the patient's arms, and respiratory motion artifact which will limit detection of distal segmental and subsegmental pulmonary emboli. Furthermore, there is near complete collapse of the left lung which results and hypoventilatory vasoconstriction of the left pulmonary arteries. No central filling defect is identified. There is mass effect upon the right lower lobe central pulmonary artery by a large right hilar lymph node complex.  Mediastinum/Nodes: Extensive pathologic adenopathy throughout the axillary mediastinal and hilar nodal stations, many of which have increased in size from comparison dated 03/09/2019. Reference nodes include: *Left supraclavicular lymph node (7/8), stable at 20 mm *Right axillary lymph node (7/31)  increased to 35 mm, previously 32 mm. There is abrupt truncation of the central airways of the left lung (7/35) there is significant narrowing of the bronchus intermedius and right upper lobe airways, likely in part due to compressive affect by the right hilar adenopathy. The esophagus is unchanged from prior. Lungs/Pleura: There is progressive airlessness throughout the left lung with only few residual areas of air bronchograms within the left upper and lower lobes. There is heterogeneous enhancement of the atelectatic lung parenchyma as well as a thickened pleural rind seen anteriorly in the left hemithorax. A right pleural drain is in place, similarly positioned comparison exam. Small right effusion remains. Bandlike areas of atelectasis and scarring present in the right lung. Redemonstration of the large pulmonary nodules and masses throughout the aerated portions of the right lung. Reference lesions include: *Largest mass in the right middle lobe (7/45) measures 4.7 cm, enlarged from 3.7 cm on prior. *Subpleural right lower lobe mass (7/49) measures 3.7 cm, enlarged from 2.3 cm on prior. Upper Abdomen: Retrocrural adenopathy again noted. No other acute abnormalities present in the visualized portions of the upper abdomen. Musculoskeletal: Extensive circumferential body wall edema which is eccentric to the left chest wall and breast tissue. There is an enhancing lesion in the left pectoralis major measuring 3.6 cm which is is similar in size to comparison CT where this measured 3.7 cm. Furthermore there is increasing soft tissue nodularity along the medial right breast which is suspected tumor extending from the right  pectoralis musculature. Innumerable sclerotic lesions are present throughout the imaged portions of the spine and appendicular skeleton. No acute pathologic fracture or other acute abnormality of the bones. Review of the MIP images confirms the above findings. IMPRESSION: 1. Markedly limited study due to both body habitus, streak from the patient's arms, and respiratory motion artifact which will limit detection of distal segmental and subsegmental pulmonary emboli. No large central pulmonary embolus identified. 2. Extensive pathologic adenopathy throughout the bilateral axillary mediastinal and hilar nodal stations, many of which have increased in size from comparison CT dated 03/09/2019, consistent with progression of metastatic disease. There is mass effect upon the bronchus intermedius and right upper lobe airways, likely in part due to compressive affect by the right hilar adenopathy. 3. Worsening airlessness throughout the left lung with only few residual areas of air bronchograms within the left upper and lower lobes. Persistent left pleural rind likely reflecting malignant involvement of the pleura 4. Extensive metastatic disease throughout the aerated portions of the right lung, many of which have increased in size from comparison dated 03/09/2019. Right pleural drain is in place, similarly positioned. Small right effusion remains. 5. Extensive osseous metastatic disease, similar to prior. 6. Extensive circumferential body wall edema which is eccentric to the left chest wall and breast tissue, concerning for lymphangitic spread of tumor. 7. Increasing soft tissue nodularity extending from the right pectoralis major towards the skin surface concerning for tumor involvement. Electronically Signed   By: Lovena Le M.D.   On: 04/09/2019 02:05   Portable Chest Xray  Result Date: 04/10/2019 CLINICAL DATA:  Metastatic breast cancer, respiratory failure. EXAM: PORTABLE CHEST 1 VIEW COMPARISON:  04/09/2019  FINDINGS: Endotracheal tube tip is 2.6 cm above the carina. Nasogastric tube enters the stomach. Power injectable right Port-A-Cath tip: Cavoatrial junction. Stable right basilar pleural drainage catheter. The patient is rotated to the left on today's radiograph, reducing diagnostic sensitivity and specificity. Complete opacification of the left hemithorax, as before. Numerous scattered pulmonary nodules  and masses in the right lung. Stable mild hazy density in the right lung potentially from mild edema. Sclerotic osseous metastatic disease for example involving the right distal clavicle. IMPRESSION: 1. Similar appearance to yesterday's exam with complete opacification of the left hemithorax and prominent scattered pulmonary nodules and masses in the right superimposed on some indistinctness of the right pulmonary vasculature which may reflect mild edema. 2. Osseous metastatic disease similar to prior. 3. Tubes and lines appear satisfactorily position. Electronically Signed   By: Van Clines M.D.   On: 04/10/2019 08:20   Portable Chest X-ray  Result Date: 04/09/2019 CLINICAL DATA:  Intubation. Metastatic breast cancer. EXAM: PORTABLE CHEST 1 VIEW 8:37 a.m. COMPARISON:  03/29/2019 at 9:21 p.m. and CT scan of the chest dated 04/09/2019 was FINDINGS: Endotracheal tube tip appears in good position approximately 5 cm above the carina. NG tube tip is below the diaphragm. Power port in place with the tip at the cavoatrial junction, unchanged. There is persistent complete opacification of the left hemithorax. Numerous metastatic lesions are noted in the right lung as demonstrated on the recent CT scan. IMPRESSION: Endotracheal tube in good position. Persistent complete opacification of the left hemithorax. Extensive metastatic lesions in the right lung. Electronically Signed   By: Lorriane Shire M.D.   On: 04/09/2019 08:59   Dg Chest Portable 1 View  Result Date: 04/02/2019 CLINICAL DATA:  44 year old female  with increasing shortness of breath and cough. Stage IV breast cancer. EXAM: PORTABLE CHEST 1 VIEW COMPARISON:  03/06/2019 FINDINGS: Complete opacification of LEFT hemithorax is noted. A RIGHT Port-A-Cath is present with tip overlying the RIGHT atrium. Decreased airspace opacities within the RIGHT lung noted with RIGHT basilar atelectasis/consolidation. There is no evidence of pneumothorax. No acute bony abnormalities are identified. A sclerotic lesion within the distal RIGHT clavicle is again noted. IMPRESSION: Complete opacification of the LEFT hemithorax which may represent a combination of airspace disease/consolidation/effusion. Increasing RIGHT lung airspace disease and RIGHT basilar atelectasis/consolidation. Electronically Signed   By: Margarette Canada M.D.   On: 03/30/2019 21:39        Scheduled Meds:  chlorhexidine gluconate (MEDLINE KIT)  15 mL Mouth Rinse BID   Chlorhexidine Gluconate Cloth  6 each Topical Daily   enoxaparin (LOVENOX) injection  60 mg Subcutaneous Q24H   gabapentin  900 mg Per Tube Q12H   mouth rinse  15 mL Mouth Rinse 10 times per day   methylPREDNISolone (SOLU-MEDROL) injection  40 mg Intravenous Q12H   metoprolol tartrate  5 mg Intravenous Q8H   mupirocin ointment   Nasal BID   pantoprazole (PROTONIX) IV  40 mg Intravenous Q24H   Continuous Infusions:  sodium chloride 100 mL/hr at 04/10/19 0605   fentaNYL infusion INTRAVENOUS 175 mcg/hr (04/10/19 0605)   piperacillin-tazobactam (ZOSYN)  IV 3.375 g (04/10/19 0753)   propofol (DIPRIVAN) infusion 30 mcg/kg/min (04/10/19 0655)   vancomycin Stopped (04/10/19 0314)     LOS: 1 day    Time spent: 30 minutes    Kathy Wahid Darleen Crocker, DO [facility] Pager [phone]  If 7PM-7AM, please contact night-coverage [email or web address] Password [email or web address] 04/10/2019, 9:25 AM

## 2019-04-10 NOTE — Progress Notes (Signed)
Subjective: She remains intubated on the ventilator.  She is on fentanyl and propofol.  She is still occasionally agitated but not as badly.  Objective: Vital signs in last 24 hours: Temp:  [98.2 F (36.8 C)-99.2 F (37.3 C)] 99.2 F (37.3 C) (09/01 0400) Pulse Rate:  [86-127] 101 (09/01 0715) Resp:  [0-48] 23 (09/01 0715) BP: (76-188)/(39-169) 108/72 (09/01 0715) SpO2:  [97 %-100 %] 98 % (09/01 0715) FiO2 (%):  [50 %-100 %] 50 % (09/01 0512) Weight:  [96.2 kg] 96.2 kg (09/01 0430) Weight change: -13.6 kg Last BM Date: 04/09/19  Intake/Output from previous day: 08/31 0701 - 09/01 0700 In: 2758.1 [I.V.:1808.3; NG/GT:30; IV Piggyback:919.8] Out: 600 [Urine:400; Emesis/NG output:200]  PHYSICAL EXAM General appearance: Intubated sedated on mechanical ventilation Resp: rhonchi bilaterally Cardio: regular rate and rhythm, S1, S2 normal, no murmur, click, rub or gallop GI: soft, non-tender; bowel sounds normal; no masses,  no organomegaly Extremities: extremities normal, atraumatic, no cyanosis or edema  Lab Results:  Results for orders placed or performed during the hospital encounter of 03/22/2019 (from the past 48 hour(s))  Urinalysis, Routine w reflex microscopic     Status: Abnormal   Collection Time: 03/29/2019  9:24 PM  Result Value Ref Range   Color, Urine YELLOW YELLOW   APPearance CLOUDY (A) CLEAR   Specific Gravity, Urine >1.046 (H) 1.005 - 1.030   pH 5.0 5.0 - 8.0   Glucose, UA NEGATIVE NEGATIVE mg/dL   Hgb urine dipstick NEGATIVE NEGATIVE   Bilirubin Urine NEGATIVE NEGATIVE   Ketones, ur NEGATIVE NEGATIVE mg/dL   Protein, ur 30 (A) NEGATIVE mg/dL   Nitrite NEGATIVE NEGATIVE   Leukocytes,Ua NEGATIVE NEGATIVE   RBC / HPF 0-5 0 - 5 RBC/hpf   WBC, UA 0-5 0 - 5 WBC/hpf   Bacteria, UA NONE SEEN NONE SEEN   Squamous Epithelial / LPF 0-5 0 - 5   Mucus PRESENT     Comment: Performed at [facility], [address] [place], [place] [address]  CBC with Differential      Status: Abnormal   Collection Time: 04/07/2019  9:46 PM  Result Value Ref Range   WBC 9.7 4.0 - 10.5 K/uL   RBC 4.05 3.87 - 5.11 MIL/uL   Hemoglobin 10.5 (L) 12.0 - 15.0 g/dL   HCT 36.3 36.0 - 46.0 %   MCV 89.6 80.0 - 100.0 fL   MCH 25.9 (L) 26.0 - 34.0 pg   MCHC 28.9 (L) 30.0 - 36.0 g/dL   RDW 18.6 (H) 11.5 - 15.5 %   Platelets 548 (H) 150 - 400 K/uL   nRBC 0.0 0.0 - 0.2 %   Neutrophils Relative % 83 %   Neutro Abs 8.1 (H) 1.7 - 7.7 K/uL   Lymphocytes Relative 5 %   Lymphs Abs 0.5 (L) 0.7 - 4.0 K/uL   Monocytes Relative 10 %   Monocytes Absolute 0.9 0.1 - 1.0 K/uL   Eosinophils Relative 0 %   Eosinophils Absolute 0.0 0.0 - 0.5 K/uL   Basophils Relative 0 %   Basophils Absolute 0.0 0.0 - 0.1 K/uL   Immature Granulocytes 2 %   Abs Immature Granulocytes 0.16 (H) 0.00 - 0.07 K/uL    Comment: Performed at [facility], [address], [place], [place] [address]  Comprehensive metabolic panel     Status: Abnormal   Collection Time: 03/18/2019  9:46 PM  Result Value Ref Range   Sodium 140 135 - 145 mmol/L   Potassium 4.2 3.5 -  5.1 mmol/L   Chloride 101 98 - 111 mmol/L   CO2 29 22 - 32 mmol/L   Glucose, Bld 96 70 - 99 mg/dL   BUN 14 6 - 20 mg/dL   Creatinine, Ser 0.85 0.44 - 1.00 mg/dL   Calcium 9.1 8.9 - 10.3 mg/dL   Total Protein 6.9 6.5 - 8.1 g/dL   Albumin 2.4 (L) 3.5 - 5.0 g/dL   AST 49 (H) 15 - 41 U/L   ALT 27 0 - 44 U/L   Alkaline Phosphatase 145 (H) 38 - 126 U/L   Total Bilirubin 0.3 0.3 - 1.2 mg/dL   GFR calc non Af Amer >60 >60 mL/min   GFR calc Af Amer >60 >60 mL/min   Anion gap 10 5 - 15    Comment: Performed at [facility], [address], [place], [place] [address]  Blood culture (routine x 2)     Status: None (Preliminary result)   Collection Time: 03/23/2019  9:46 PM   Specimen: A-Line; Blood  Result Value Ref Range   Specimen Description A-LINE    Special Requests      BOTTLES DRAWN AEROBIC AND ANAEROBIC Blood Culture adequate volume   Culture      NO GROWTH  2 DAYS Performed at [facility], [address], [place], [place] [address]    Report Status PENDING   Blood culture (routine x 2)     Status: None (Preliminary result)   Collection Time: 03/23/2019  9:46 PM   Specimen: A-Line; Blood  Result Value Ref Range   Specimen Description A-LINE    Special Requests      BOTTLES DRAWN AEROBIC AND ANAEROBIC Blood Culture adequate volume   Culture      NO GROWTH 2 DAYS Performed at [facility], [address] [place], [place] [address]    Report Status PENDING   Lactic acid, plasma     Status: None   Collection Time: 03/28/2019  9:46 PM  Result Value Ref Range   Lactic Acid, Venous 1.7 0.5 - 1.9 mmol/L    Comment: Performed at [facility], [address] [place], [place] [address]  APTT     Status: None   Collection Time: 03/19/2019  9:46 PM  Result Value Ref Range   aPTT 26 24 - 36 seconds    Comment: Performed at [facility], [address] [place], [place] [address]  Protime-INR     Status: None   Collection Time: 03/14/2019  9:46 PM  Result Value Ref Range   Prothrombin Time 13.2 11.4 - 15.2 seconds   INR 1.0 0.8 - 1.2    Comment: (NOTE) INR goal varies based on device and disease states. Performed at [facility], [address] [place], [place] [address]   SARS Coronavirus 2 [facility] order, Performed in [facility] hospital lab) Nasopharyngeal Nasopharyngeal Swab     Status: None   Collection Time: 03/16/2019 11:25 PM   Specimen: Nasopharyngeal Swab  Result Value Ref Range   SARS Coronavirus 2 NEGATIVE NEGATIVE    Comment: (NOTE) If result is NEGATIVE SARS-CoV-2 target nucleic acids are NOT DETECTED. The SARS-CoV-2 RNA is generally detectable in upper and lower  respiratory specimens during the acute phase of infection. The lowest  concentration of SARS-CoV-2 viral copies this assay can detect is 250  copies / mL. A negative result does not preclude SARS-CoV-2 infection  and should not be used as the sole basis for treatment or  other  patient management  decisions.  A negative result may occur with  improper specimen collection / handling, submission of specimen other  than nasopharyngeal swab, presence of viral mutation(s) within the  areas targeted by this assay, and inadequate number of viral copies  (<250 copies / mL). A negative result must be combined with clinical  observations, patient history, and epidemiological information. If result is POSITIVE SARS-CoV-2 target nucleic acids are DETECTED. The SARS-CoV-2 RNA is generally detectable in upper and lower  respiratory specimens dur ing the acute phase of infection.  Positive  results are indicative of active infection with SARS-CoV-2.  Clinical  correlation with patient history and other diagnostic information is  necessary to determine patient infection status.  Positive results do  not rule out bacterial infection or co-infection with other viruses. If result is PRESUMPTIVE POSTIVE SARS-CoV-2 nucleic acids MAY BE PRESENT.   A presumptive positive result was obtained on the submitted specimen  and confirmed on repeat testing.  While 2019 novel coronavirus  (SARS-CoV-2) nucleic acids may be present in the submitted sample  additional confirmatory testing may be necessary for epidemiological  and / or clinical management purposes  to differentiate between  SARS-CoV-2 and other Sarbecovirus currently known to infect humans.  If clinically indicated additional testing with an alternate test  methodology [phone]) is advised. The SARS-CoV-2 RNA is generally  detectable in upper and lower respiratory sp ecimens during the acute  phase of infection. The expected result is Negative. Fact Sheet for Patients:  StrictlyIdeas.no Fact Sheet for Healthcare Providers: BankingDealers.co.za This test is not yet approved or cleared by the [place] FDA and has been authorized for detection and/or diagnosis of  SARS-CoV-2 by FDA under an Emergency Use Authorization (EUA).  This EUA will remain in effect (meaning this test can be used) for the duration of the COVID-19 declaration under Section 564(b)(1) of the Act, 21 U.S.C. section 360bbb-3(b)(1), unless the authorization is terminated or revoked sooner. Performed at [facility], [address], [place], [place] [address]   MRSA PCR Screening     Status: Abnormal   Collection Time: 04/09/19  6:18 AM   Specimen: Nasal Mucosa; Nasopharyngeal  Result Value Ref Range   MRSA by PCR POSITIVE (A) NEGATIVE    Comment:        The GeneXpert MRSA Assay (FDA approved for NASAL specimens only), is one component of a comprehensive MRSA colonization surveillance program. It is not intended to diagnose MRSA infection nor to guide or monitor treatment for MRSA infections. CALLED TO [facility] SMITH AT 1572 BY HFLYNT 04/09/19 Performed at [facility] [address], [place], [place] [address]   Protime-INR     Status: None   Collection Time: 04/09/19  7:21 AM  Result Value Ref Range   Prothrombin Time 13.9 11.4 - 15.2 seconds   INR 1.1 0.8 - 1.2    Comment: (NOTE) INR goal varies based on device and disease states. Performed at [facility], [address], [place], [place] [address]   Cortisol-am, blood     Status: Abnormal   Collection Time: 04/09/19  7:21 AM  Result Value Ref Range   Cortisol - AM 36.4 (H) 6.7 - 22.6 ug/dL    Comment: Performed at [facility] [address] [place], [place] [address]  Procalcitonin     Status: None   Collection Time: 04/09/19  7:21 AM  Result Value Ref Range   Procalcitonin 0.10 ng/mL    Comment:  Interpretation: PCT (Procalcitonin) <= 0.5 ng/mL: Systemic infection (sepsis) is not likely. Local bacterial infection is possible. (NOTE)       Sepsis PCT Algorithm           Lower Respiratory Tract                                      Infection PCT Algorithm    ----------------------------      ----------------------------         PCT < 0.25 ng/mL                PCT < 0.10 ng/mL         Strongly encourage             Strongly discourage   discontinuation of antibiotics    initiation of antibiotics    ----------------------------     -----------------------------       PCT 0.25 - 0.50 ng/mL            PCT 0.10 - 0.25 ng/mL               OR       >80% decrease in PCT            Discourage initiation of                                            antibiotics      Encourage discontinuation           of antibiotics    ----------------------------     -----------------------------         PCT >= 0.50 ng/mL              PCT 0.26 - 0.50 ng/mL               AND        <80% decrease in PCT             Encourage initiation of                                             antibiotics       Encourage continuation           of antibiotics    ----------------------------     -----------------------------        PCT >= 0.50 ng/mL                  PCT > 0.50 ng/mL               AND         increase in PCT                  Strongly encourage                                      initiation of antibiotics    Strongly encourage escalation           of antibiotics                                     -----------------------------  PCT <= 0.25 ng/mL                                                 OR                                        > 80% decrease in PCT                                     Discontinue / Do not initiate                                             antibiotics Performed at [facility], [address] [place], [place] [address]   CBC     Status: Abnormal   Collection Time: 04/09/19  7:21 AM  Result Value Ref Range   WBC 9.3 4.0 - 10.5 K/uL   RBC 4.02 3.87 - 5.11 MIL/uL   Hemoglobin 10.2 (L) 12.0 - 15.0 g/dL   HCT 36.3 36.0 - 46.0 %   MCV 90.3 80.0 - 100.0 fL   MCH 25.4 (L) 26.0 - 34.0 pg   MCHC 28.1 (L) 30.0 - 36.0 g/dL   RDW 18.6  (H) 11.5 - 15.5 %   Platelets 517 (H) 150 - 400 K/uL   nRBC 0.0 0.0 - 0.2 %    Comment: Performed at [facility], [address], [place], [place] [address]  Magnesium     Status: None   Collection Time: 04/09/19  7:21 AM  Result Value Ref Range   Magnesium 2.2 1.7 - 2.4 mg/dL    Comment: Performed at [facility], [address], [place], [place] [address]  Comprehensive metabolic panel     Status: Abnormal   Collection Time: 04/09/19  7:21 AM  Result Value Ref Range   Sodium 139 135 - 145 mmol/L   Potassium 4.0 3.5 - 5.1 mmol/L   Chloride 100 98 - 111 mmol/L   CO2 28 22 - 32 mmol/L   Glucose, Bld 99 70 - 99 mg/dL   BUN 13 6 - 20 mg/dL   Creatinine, Ser 0.86 0.44 - 1.00 mg/dL   Calcium 9.0 8.9 - 10.3 mg/dL   Total Protein 7.0 6.5 - 8.1 g/dL   Albumin 2.3 (L) 3.5 - 5.0 g/dL   AST 46 (H) 15 - 41 U/L   ALT 25 0 - 44 U/L   Alkaline Phosphatase 142 (H) 38 - 126 U/L   Total Bilirubin 0.2 (L) 0.3 - 1.2 mg/dL   GFR calc non Af Amer >60 >60 mL/min   GFR calc Af Amer >60 >60 mL/min   Anion gap 11 5 - 15    Comment: Performed at [facility], [address] [place], [place] [address]  Blood gas, arterial     Status: Abnormal   Collection Time: 04/09/19  7:25 AM  Result Value Ref Range   FIO2 100.00    pH, Arterial 7.390 7.350 - 7.450   pCO2 arterial 47.9 32.0 - 48.0 mmHg   pO2, Arterial 339 (H) 83.0 -  108.0 mmHg   Bicarbonate 27.5 20.0 - 28.0 mmol/L   Acid-Base Excess 3.8 (H) 0.0 - 2.0 mmol/L   O2 Saturation 99.6 %   Patient temperature 37.7    Allens test (pass/fail) PASS PASS    Comment: Performed at [facility], [address] [place], [place] [address]  Glucose, capillary     Status: Abnormal   Collection Time: 04/09/19  8:15 AM  Result Value Ref Range   Glucose-Capillary 111 (H) 70 - 99 mg/dL  Glucose, capillary     Status: Abnormal   Collection Time: 04/09/19 11:36 AM  Result Value Ref Range   Glucose-Capillary 104 (H) 70 - 99 mg/dL  Pregnancy, urine     Status: None    Collection Time: 04/09/19  4:45 PM  Result Value Ref Range   Preg Test, Ur NEGATIVE NEGATIVE    Comment:        THE SENSITIVITY OF THIS METHODOLOGY IS >20 mIU/mL. Performed at [facility], [address], [place], [place] [address]   Glucose, capillary     Status: Abnormal   Collection Time: 04/09/19  4:46 PM  Result Value Ref Range   Glucose-Capillary 119 (H) 70 - 99 mg/dL  Glucose, capillary     Status: Abnormal   Collection Time: 04/09/19  7:44 PM  Result Value Ref Range   Glucose-Capillary 104 (H) 70 - 99 mg/dL  Glucose, capillary     Status: Abnormal   Collection Time: 04/10/19 12:04 AM  Result Value Ref Range   Glucose-Capillary 106 (H) 70 - 99 mg/dL  Glucose, capillary     Status: Abnormal   Collection Time: 04/10/19  4:05 AM  Result Value Ref Range   Glucose-Capillary 112 (H) 70 - 99 mg/dL  Triglycerides     Status: Abnormal   Collection Time: 04/10/19  4:26 AM  Result Value Ref Range   Triglycerides 259 (H) <150 mg/dL    Comment: Performed at [facility], [address] [place], [place] [address]  Comprehensive metabolic panel     Status: Abnormal   Collection Time: 04/10/19  4:26 AM  Result Value Ref Range   Sodium 139 135 - 145 mmol/L   Potassium 4.6 3.5 - 5.1 mmol/L   Chloride 101 98 - 111 mmol/L   CO2 26 22 - 32 mmol/L   Glucose, Bld 108 (H) 70 - 99 mg/dL   BUN 15 6 - 20 mg/dL   Creatinine, Ser 0.89 0.44 - 1.00 mg/dL   Calcium 8.7 (L) 8.9 - 10.3 mg/dL   Total Protein 6.6 6.5 - 8.1 g/dL   Albumin 2.1 (L) 3.5 - 5.0 g/dL   AST 56 (H) 15 - 41 U/L   ALT 30 0 - 44 U/L   Alkaline Phosphatase 197 (H) 38 - 126 U/L   Total Bilirubin 0.3 0.3 - 1.2 mg/dL   GFR calc non Af Amer >60 >60 mL/min   GFR calc Af Amer >60 >60 mL/min   Anion gap 12 5 - 15    Comment: Performed at [facility], [address] [place], [place] [address]  CBC     Status: Abnormal   Collection Time: 04/10/19  4:26 AM  Result Value Ref Range   WBC 10.0 4.0 - 10.5 K/uL   RBC 3.65 (L) 3.87 -  5.11 MIL/uL   Hemoglobin 9.5 (L) 12.0 - 15.0 g/dL   HCT 33.2 (L) 36.0 - 46.0 %   MCV 91.0 80.0 - 100.0 fL   MCH 26.0 26.0 -  34.0 pg   MCHC 28.6 (L) 30.0 - 36.0 g/dL   RDW 18.7 (H) 11.5 - 15.5 %   Platelets 554 (H) 150 - 400 K/uL   nRBC 0.0 0.0 - 0.2 %    Comment: Performed at [facility], [address] [place], [place] [address]  Lactic acid, plasma     Status: None   Collection Time: 04/10/19  4:26 AM  Result Value Ref Range   Lactic Acid, Venous 1.8 0.5 - 1.9 mmol/L    Comment: Performed at [facility], [address], [place], [place] [address]  Blood gas, arterial     Status: Abnormal   Collection Time: 04/10/19  5:18 AM  Result Value Ref Range   FIO2 50.00    Delivery systems VENTILATOR    Mode PRESSURE REGULATED VOLUME CONTROL    VT 440 mL   LHR 20 resp/min   Peep/cpap 5.0 cm H20   pH, Arterial 7.370 7.350 - 7.450   pCO2 arterial 49.2 (H) 32.0 - 48.0 mmHg   pO2, Arterial 112 (H) 83.0 - 108.0 mmHg   Bicarbonate 26.6 20.0 - 28.0 mmol/L   Acid-Base Excess 2.9 (H) 0.0 - 2.0 mmol/L   O2 Saturation 97.3 %   Patient temperature 37.0    Collection site RIGHT RADIAL    Drawn by [id number]    Allens test (pass/fail) PASS PASS    Comment: Performed at [facility], [address], [place], [place] [address]    ABGS Recent Labs    04/10/19 0518  PHART 7.370  PO2ART 112*  HCO3 26.6   CULTURES Recent Results (from the past 240 hour(s))  Blood culture (routine x 2)     Status: None (Preliminary result)   Collection Time: 03/26/2019  9:46 PM   Specimen: A-Line; Blood  Result Value Ref Range Status   Specimen Description A-LINE  Final   Special Requests   Final    BOTTLES DRAWN AEROBIC AND ANAEROBIC Blood Culture adequate volume   Culture   Final    NO GROWTH 2 DAYS Performed at [facility], [address] [place], [place] [address]    Report Status PENDING  Incomplete  Blood culture (routine x 2)     Status: None (Preliminary result)   Collection Time: 04/03/2019  9:46 PM    Specimen: A-Line; Blood  Result Value Ref Range Status   Specimen Description A-LINE  Final   Special Requests   Final    BOTTLES DRAWN AEROBIC AND ANAEROBIC Blood Culture adequate volume   Culture   Final    NO GROWTH 2 DAYS Performed at [facility], [address] [place], [place] [address]    Report Status PENDING  Incomplete  SARS Coronavirus 2 [facility] order, Performed in [place] hospital lab) Nasopharyngeal Nasopharyngeal Swab     Status: None   Collection Time: 03/11/2019 11:25 PM   Specimen: Nasopharyngeal Swab  Result Value Ref Range Status   SARS Coronavirus 2 NEGATIVE NEGATIVE Final    Comment: (NOTE) If result is NEGATIVE SARS-CoV-2 target nucleic acids are NOT DETECTED. The SARS-CoV-2 RNA is generally detectable in upper and lower  respiratory specimens during the acute phase of infection. The lowest  concentration of SARS-CoV-2 viral copies this assay can detect is 250  copies / mL. A negative result does not preclude SARS-CoV-2 infection  and should not be used as the sole basis for treatment or other  patient management decisions.  A negative result may occur with  improper specimen  collection / handling, submission of specimen other  than nasopharyngeal swab, presence of viral mutation(s) within the  areas targeted by this assay, and inadequate number of viral copies  (<250 copies / mL). A negative result must be combined with clinical  observations, patient history, and epidemiological information. If result is POSITIVE SARS-CoV-2 target nucleic acids are DETECTED. The SARS-CoV-2 RNA is generally detectable in upper and lower  respiratory specimens dur ing the acute phase of infection.  Positive  results are indicative of active infection with SARS-CoV-2.  Clinical  correlation with patient history and other diagnostic information is  necessary to determine patient infection status.  Positive results do  not rule out bacterial infection or co-infection  with other viruses. If result is PRESUMPTIVE POSTIVE SARS-CoV-2 nucleic acids MAY BE PRESENT.   A presumptive positive result was obtained on the submitted specimen  and confirmed on repeat testing.  While 2019 novel coronavirus  (SARS-CoV-2) nucleic acids may be present in the submitted sample  additional confirmatory testing may be necessary for epidemiological  and / or clinical management purposes  to differentiate between  SARS-CoV-2 and other Sarbecovirus currently known to infect humans.  If clinically indicated additional testing with an alternate test  methodology [phone]) is advised. The SARS-CoV-2 RNA is generally  detectable in upper and lower respiratory sp ecimens during the acute  phase of infection. The expected result is Negative. Fact Sheet for Patients:  StrictlyIdeas.no Fact Sheet for Healthcare Providers: BankingDealers.co.za This test is not yet approved or cleared by the [place] FDA and has been authorized for detection and/or diagnosis of SARS-CoV-2 by FDA under an Emergency Use Authorization (EUA).  This EUA will remain in effect (meaning this test can be used) for the duration of the COVID-19 declaration under Section 564(b)(1) of the Act, 21 U.S.C. section 360bbb-3(b)(1), unless the authorization is terminated or revoked sooner. Performed at [facility], [address], [place], [place] [address]   MRSA PCR Screening     Status: Abnormal   Collection Time: 04/09/19  6:18 AM   Specimen: Nasal Mucosa; Nasopharyngeal  Result Value Ref Range Status   MRSA by PCR POSITIVE (A) NEGATIVE Final    Comment:        The GeneXpert MRSA Assay (FDA approved for NASAL specimens only), is one component of a comprehensive MRSA colonization surveillance program. It is not intended to diagnose MRSA infection nor to guide or monitor treatment for MRSA infections. CALLED TO [facility] SMITH AT 8110 BY HFLYNT  04/09/19 Performed at [facility], [address] [place], [place] [address]    Studies/Results: Ct Head Wo Contrast  Result Date: 04/09/2019 CLINICAL DATA:  Initial evaluation for acute altered mental status. EXAM: CT HEAD WITHOUT CONTRAST TECHNIQUE: Contiguous axial images were obtained from the base of the skull through the vertex without intravenous contrast. COMPARISON:  Comparison made with recent brain MRI from 03/30/2019. FINDINGS: Brain: Cerebral volume stable, and within normal limits for age. Previously question metastatic lesion involving the superior left cerebellum not visible by CT. No other discrete mass lesion, mass effect, or midline shift. No intracranial hemorrhage. No acute large vessel territory infarct. Ventricles normal size without hydrocephalus. No extra-axial fluid collection. Vascular: No hyperdense vessel. 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 Additional 6 mm soft tissue nodule at the right frontal scalp also unchanged. No other new focal osseous lesions. Sinuses/Orbits: Globes orbital soft tissues within normal limits. Mild scattered mucosal thickening within  the sphenoid sinuses. Visualized paranasal sinuses are otherwise clear. Mastoid air cells and middle ear cavities are well pneumatized and free of fluid. Other: Malignant adenopathy partially visualized within the left supraclavicular fossa, better seen on recent neck CT. IMPRESSION: 1. No acute intracranial abnormality. 2. 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 3. Previously question left superficial cerebellar lesion not visible by CT. 4. Malignant adenopathy partially visualized within the left supraclavicular fossa, better seen on recent neck CT. Electronically Signed   By: Jeannine Boga M.D.   On: 04/09/2019 01:59   Ct Angio Chest Pe W And/or Wo Contrast  Result Date:  04/09/2019 CLINICAL DATA:  PE suspected, high pretest probability. Acute shortness of breath with exertion, receiving chemotherapy for breast cancer with possible brain metastases. Bucket handle EXAM: CT ANGIOGRAPHY CHEST WITH CONTRAST TECHNIQUE: Multidetector CT imaging of the chest was performed using the standard protocol during bolus administration of intravenous contrast. Multiplanar CT image reconstructions and MIPs were obtained to evaluate the vascular anatomy. CONTRAST:  195m OMNIPAQUE IOHEXOL 350 MG/ML SOLN COMPARISON:  CTA 03/09/2019 FINDINGS: Cardiovascular: Right internal jugular approach Port-A-Cath tip terminates at the right atrium. While there is satisfactory opacification of the central pulmonary arteries the study is markedly limited by both body habitus, streak from the patient's arms, and respiratory motion artifact which will limit detection of distal segmental and subsegmental pulmonary emboli. Furthermore, there is near complete collapse of the left lung which results and hypoventilatory vasoconstriction of the left pulmonary arteries. No central filling defect is identified. There is mass effect upon the right lower lobe central pulmonary artery by a large right hilar lymph node complex. Mediastinum/Nodes: Extensive pathologic adenopathy throughout the axillary mediastinal and hilar nodal stations, many of which have increased in size from comparison dated 03/09/2019. Reference nodes include: *Left supraclavicular lymph node (7/8), stable at 20 mm *Right axillary lymph node (7/31) increased to 35 mm, previously 32 mm. There is abrupt truncation of the central airways of the left lung (7/35) there is significant narrowing of the bronchus intermedius and right upper lobe airways, likely in part due to compressive affect by the right hilar adenopathy. The esophagus is unchanged from prior. Lungs/Pleura: There is progressive airlessness throughout the left lung with only few residual areas of  air bronchograms within the left upper and lower lobes. There is heterogeneous enhancement of the atelectatic lung parenchyma as well as a thickened pleural rind seen anteriorly in the left hemithorax. A right pleural drain is in place, similarly positioned comparison exam. Small right effusion remains. Bandlike areas of atelectasis and scarring present in the right lung. Redemonstration of the large pulmonary nodules and masses throughout the aerated portions of the right lung. Reference lesions include: *Largest mass in the right middle lobe (7/45) measures 4.7 cm, enlarged from 3.7 cm on prior. *Subpleural right lower lobe mass (7/49) measures 3.7 cm, enlarged from 2.3 cm on prior. Upper Abdomen: Retrocrural adenopathy again noted. No other acute abnormalities present in the visualized portions of the upper abdomen. Musculoskeletal: Extensive circumferential body wall edema which is eccentric to the left chest wall and breast tissue. There is an enhancing lesion in the left pectoralis major measuring 3.6 cm which is is similar in size to comparison CT where this measured 3.7 cm. Furthermore there is increasing soft tissue nodularity along the medial right breast which is suspected tumor extending from the right pectoralis musculature. Innumerable sclerotic lesions are present throughout the imaged portions of the spine  and appendicular skeleton. No acute pathologic fracture or other acute abnormality of the bones. Review of the MIP images confirms the above findings. IMPRESSION: 1. Markedly limited study due to both body habitus, streak from the patient's arms, and respiratory motion artifact which will limit detection of distal segmental and subsegmental pulmonary emboli. No large central pulmonary embolus identified. 2. Extensive pathologic adenopathy throughout the bilateral axillary mediastinal and hilar nodal stations, many of which have increased in size from comparison CT dated 03/09/2019, consistent with  progression of metastatic disease. There is mass effect upon the bronchus intermedius and right upper lobe airways, likely in part due to compressive affect by the right hilar adenopathy. 3. Worsening airlessness throughout the left lung with only few residual areas of air bronchograms within the left upper and lower lobes. Persistent left pleural rind likely reflecting malignant involvement of the pleura 4. Extensive metastatic disease throughout the aerated portions of the right lung, many of which have increased in size from comparison dated 03/09/2019. Right pleural drain is in place, similarly positioned. Small right effusion remains. 5. Extensive osseous metastatic disease, similar to prior. 6. Extensive circumferential body wall edema which is eccentric to the left chest wall and breast tissue, concerning for lymphangitic spread of tumor. 7. Increasing soft tissue nodularity extending from the right pectoralis major towards the skin surface concerning for tumor involvement. Electronically Signed   By: Lovena Le M.D.   On: 04/09/2019 02:05   Portable Chest X-ray  Result Date: 04/09/2019 CLINICAL DATA:  Intubation. Metastatic breast cancer. EXAM: PORTABLE CHEST 1 VIEW 8:37 a.m. COMPARISON:  04/02/2019 at 9:21 p.m. and CT scan of the chest dated 04/09/2019 was FINDINGS: Endotracheal tube tip appears in good position approximately 5 cm above the carina. NG tube tip is below the diaphragm. Power port in place with the tip at the cavoatrial junction, unchanged. There is persistent complete opacification of the left hemithorax. Numerous metastatic lesions are noted in the right lung as demonstrated on the recent CT scan. IMPRESSION: Endotracheal tube in good position. Persistent complete opacification of the left hemithorax. Extensive metastatic lesions in the right lung. Electronically Signed   By: Lorriane Shire M.D.   On: 04/09/2019 08:59   Dg Chest Portable 1 View  Result Date: 03/30/2019 CLINICAL  DATA:  43 year old female with increasing shortness of breath and cough. Stage IV breast cancer. EXAM: PORTABLE CHEST 1 VIEW COMPARISON:  03/06/2019 FINDINGS: Complete opacification of LEFT hemithorax is noted. A RIGHT Port-A-Cath is present with tip overlying the RIGHT atrium. Decreased airspace opacities within the RIGHT lung noted with RIGHT basilar atelectasis/consolidation. There is no evidence of pneumothorax. No acute bony abnormalities are identified. A sclerotic lesion within the distal RIGHT clavicle is again noted. IMPRESSION: Complete opacification of the LEFT hemithorax which may represent a combination of airspace disease/consolidation/effusion. Increasing RIGHT lung airspace disease and RIGHT basilar atelectasis/consolidation. Electronically Signed   By: Margarette Canada M.D.   On: 04/04/2019 21:39    Medications:  Prior to Admission:  Medications Prior to Admission  Medication Sig Dispense Refill Last Dose  . cyclobenzaprine (FLEXERIL) 10 MG tablet Take 1 tablet (10 mg total) by mouth 3 (three) times daily as needed for muscle spasms. 30 tablet 0 unknown  . gabapentin (NEURONTIN) 300 MG capsule Take 3 capsules (900 mg total) by mouth 2 (two) times daily. 180 capsule 5 03/22/2019 at Unknown time  . guaiFENesin (MUCINEX) 600 MG 12 hr tablet Take 600 mg by mouth 2 (two) times daily.  03/19/2019 at Unknown time  . ipratropium (ATROVENT HFA) 17 MCG/ACT inhaler Inhale 2 puffs into the lungs every 4 (four) hours as needed for wheezing. 1 Inhaler 12 03/10/2019 at Unknown time  . lidocaine-prilocaine (EMLA) cream Apply to affected area once 30 g 3 unknown  . LORazepam (ATIVAN) 0.5 MG tablet Take 1 tablet (0.5 mg total) by mouth every 6 (six) hours as needed (Nausea or vomiting). 50 tablet 0 04/07/2019 at Unknown time  . metoprolol tartrate (LOPRESSOR) 25 MG tablet Take 0.5 tablets (12.5 mg total) by mouth 2 (two) times daily. 30 tablet 1 03/29/2019 at 1900  . oxyCODONE (OXY IR/ROXICODONE) 5 MG immediate  release tablet Take 2 tablets (10 mg total) by mouth every 4 (four) hours as needed for up to 10 days for severe pain. 120 tablet 0 03/18/2019 at Unknown time  . prochlorperazine (COMPAZINE) 10 MG tablet Take 1 tablet (10 mg total) by mouth every 6 (six) hours as needed (Nausea or vomiting). 30 tablet 4 unknown  . UNABLE TO FIND As per Medical necessity, Mastectomy Bra Q#2-Dx Code C50.812, Z17.1 Malignant neoplasm of overlapping sites of left breast in female, estrogen receptor negative 2 each 0 unknown  . dexamethasone (DECADRON) 4 MG tablet Take 2 tablets (8 mg total) by mouth daily. Start the day after carboplatin chemotherapy for 3 days. (Patient not taking: Reported on 04/04/2019) 30 tablet 1 Not Taking at Unknown time  . fentaNYL (DURAGESIC) 50 MCG/HR Place 1 patch onto the skin every 3 (three) days. (Patient not taking: Reported on 04/04/2019) 10 patch 0 Not Taking at Unknown time  . ondansetron (ZOFRAN) 8 MG tablet Take 1 tablet (8 mg total) by mouth 2 (two) times daily as needed for refractory nausea / vomiting. 30 tablet 4 unknown   Scheduled: . chlorhexidine gluconate (MEDLINE KIT)  15 mL Mouth Rinse BID  . Chlorhexidine Gluconate Cloth  6 each Topical Daily  . enoxaparin (LOVENOX) injection  60 mg Subcutaneous Q24H  . gabapentin  900 mg Per Tube Q12H  . mouth rinse  15 mL Mouth Rinse 10 times per day  . methylPREDNISolone (SOLU-MEDROL) injection  40 mg Intravenous Q12H  . metoprolol tartrate  5 mg Intravenous Q8H  . mupirocin ointment   Nasal BID  . pantoprazole (PROTONIX) IV  40 mg Intravenous Q24H   Continuous: . sodium chloride 100 mL/hr at 04/10/19 0605  . fentaNYL infusion INTRAVENOUS 175 mcg/hr (04/10/19 0605)  . piperacillin-tazobactam (ZOSYN)  IV Stopped (04/10/19 0304)  . propofol (DIPRIVAN) infusion 30 mcg/kg/min (04/10/19 0655)  . vancomycin Stopped (04/10/19 0314)   NID:[id number] **OR** acetaminophen, bisacodyl, fentaNYL (SUBLIMAZE) injection, fentaNYL (SUBLIMAZE)  injection, ipratropium-albuterol, midazolam, ondansetron **OR** ondansetron (ZOFRAN) IV  Assesment: She was admitted with sepsis with pneumonia.  She had acute hypoxic respiratory failure and ended up having to be intubated and placed on mechanical ventilation.  She has essentially white out of her left lung.  This appears to be mostly from metastatic breast cancer.  She probably has some element of pneumonia which would likely be postobstructive which is being treated  She no longer has sepsis pathophysiology  Her metastatic breast cancer has progressed rapidly.  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  She is critically and likely terminally ill Active Problems:   Sepsis ([place])   Sepsis due to pneumonia [facility])    Plan: Continue support.  Palliative care consult.  Note  from [place] seen and appreciated    LOS: 1 day   Alonza Bogus 04/10/2019, 7:31 AM

## 2019-04-10 NOTE — Consult Note (Addendum)
[facility] Nurse wound consult note Reason for Consult:Patient with Stage 4 breast cancer and chronic, nonhealing (maintenance) left breast wounds, full thickness. She has been seen by the outpatient wound care center in [place], where medihoney and silver hydrofiber were used.  The patient is following at the [facility] cancer center. She is admitted for shortness of breath, increased pain. She missed her oncology appointment on 8/27.  She was last seen by my partner, D. Engels on 03/07/19 for these wounds. Silver hydrofiber was continued. Medihoney is a non formulary item. Wound type:full thickness, neoplastic Pressure Injury POA: N/A Measurement: Bedside RN obtained measurements today and documented on flow sheet 4cm x 1.5cm x 0.3cm Wound DO:[id number] dry, pink, painful (per Bedside RN, B.Harris) Drainage (amount, consistency, odor) scant serosanguinous Periwound:intact, firm Dressing procedure/placement/frequency: The silver hydrofiber dressing is keeping odor to a minimum, but the wound is currently clean, red and dry with scant exudate with pain and scant bleeding upon dressing removal. We will discontinue the silver hydrofiber dressing in favor of a nonadherent antimicrobial, xeroform, topped with gauze. Securement will be with a silicone foam to avoid trauma in removal.  [facility] nursing team will not follow, but will remain available to this patient, the nursing and medical teams.  Please re-consult if needed. Thanks, Maudie Flakes, MSN, RN, [place], Arther Abbott  Pager# [phone]

## 2019-04-10 NOTE — Consult Note (Signed)
Consultation Note Date: 04/10/2019   Patient Name: Sharon Floyd  DOB: 1976-05-13  MRN: [id number]  Age / Sex: 43 y.o., female  PCP: Baruch Gouty, FNP Referring Physician: Rodena Goldmann, DO  Reason for Consultation: Establishing goals of care  HPI/Patient Profile: 42 y.o. female  with past medical history of progressing stage 4 breast cancer (mets to lung, brain, liver, bilateral pleural effusions, progressive thoracic adenopathy), s/p R PleurX catheter, arthritis admitted on 03/25/2019 with worsening SOB requiring intubation after failing BiPAP trial. Complete left lung white out with progressing cancer and likely postobstructive pneumonia.   Clinical Assessment and Goals of Care: I met today at Sharon Floyd's bedside with her mother, Sharon Floyd [facility]). I have reviewed records for [name] and that she has had progressive and aggressive metastatic breast cancer. Chemotherapy has not been very effective as her cancer continues to progress. Radiation therapy is not an option as she has not been able to lie flat.   I spoke further with Sharon Floyd and she understands the severity of her daughter's illness. She has been hopeful for improvement but understands this is unlikely. We discussed the main concern is for Sharon Floyd's 96 yo son. I am helping to get them connected with [facility]. Sharon Floyd has been trying to prepare him but needs help. I have offered to help son visit or to even speak with him outside the hospital if he does not wish to visit. She will let me know what she feels is needed.   We discussed GOC and Sharon Floyd agrees that her daughter would not want to be left alive on machines. She agrees at this time that her daughter would not want to be resuscitated but rather be comfortable if she were to worsen and be at EOL. We also discussed that if there is no improvement or if she worsens that the next step would be one way  extubation and focus on comfort. Comfort is a priority regardless as prognosis is poor given her significant progression and lack of options to slow progression.   I spent time speaking with Sharon Floyd and offering support. All questions/concerns addressed. Emotional support provided.   Primary Decision Maker HCPOA    SUMMARY OF RECOMMENDATIONS   - Partial code (continue intubation) - Discussed consideration/preparation for one way extubation - Biggest concern is [name]'s comfort and support for her son.   Code Status/Advance Care Planning:  Limited code - continue vent   Symptom Management:   Pain/sedation: Fentanyl infusion with bolus prn.   Agitation: Versed prn.   Palliative Prophylaxis:   Aspiration, Delirium Protocol, Frequent Pain Assessment, Oral Care and Turn Reposition  Psycho-social/Spiritual:   Desire for further Chaplaincy support:yes  Additional Recommendations: Grief/Bereavement Support and [facility] Referral  Prognosis:   Overall poor and unlikely to survive hospitalization.   Discharge Planning: Possible hospital death.      Primary Diagnoses: Present on Admission: . Sepsis ([place]) . Sepsis due to pneumonia [facility])   I have reviewed the medical record, interviewed the patient and family,  and examined the patient. The following aspects are pertinent.  Past Medical History:  Diagnosis Date  . Arthritis   . Cancer [facility])    breast  . Family history of breast cancer   . Family history of colon cancer    Social History   Socioeconomic History  . Marital status: Single    Spouse name: Not on file  . Number of children: Not on file  . Years of education: Not on file  . Highest education level: Not on file  Occupational History  . Not on file  Social Needs  . Financial resource strain: Not on file  . Food insecurity    Worry: Not on file    Inability: Not on file  . Transportation needs    Medical: No    Non-medical: No  Tobacco Use  .  Smoking status: Former Smoker    Types: Cigarettes    Quit date: 10/09/2018    Years since quitting: 0.5  . Smokeless tobacco: Never Used  Substance and Sexual Activity  . Alcohol use: Not Currently  . Drug use: Never  . Sexual activity: Not Currently  Lifestyle  . Physical activity    Days per week: Not on file    Minutes per session: Not on file  . Stress: Not on file  Relationships  . Social Herbalist on phone: Not on file    Gets together: Not on file    Attends religious service: Not on file    Active member of club or organization: Not on file    Attends meetings of clubs or organizations: Not on file    Relationship status: Not on file  Other Topics Concern  . Not on file  Social History Narrative  . Not on file   Family History  Problem Relation Age of Onset  . Arthritis Mother   . Diabetes Father   . Hypertension Father   . Arthritis Father   . Heart disease Father   . Early death Maternal Grandmother   . Asthma Maternal Grandfather   . Heart disease Maternal Grandfather   . Migraines Maternal Grandfather   . Early death Maternal Grandfather   . Asthma Paternal Grandmother   . Hypertension Paternal Grandmother   . Asthma Paternal Grandfather   . Hypertension Paternal Grandfather   . Breast cancer Other        paternal cousin, female  . Colon cancer Cousin        paternal  . Breast cancer Cousin        paternal  . Lung cancer Other    Scheduled Meds: . chlorhexidine gluconate (MEDLINE KIT)  15 mL Mouth Rinse BID  . Chlorhexidine Gluconate Cloth  6 each Topical Daily  . enoxaparin (LOVENOX) injection  60 mg Subcutaneous Q24H  . feeding supplement (PRO-STAT SUGAR FREE 64)  60 mL Per Tube TID  . feeding supplement (VITAL HIGH PROTEIN)  1,000 mL Per Tube Q24H  . gabapentin  900 mg Per Tube Q12H  . mouth rinse  15 mL Mouth Rinse 10 times per day  . methylPREDNISolone (SOLU-MEDROL) injection  40 mg Intravenous Q12H  . metoprolol tartrate  5 mg  Intravenous Q8H  . mupirocin ointment   Nasal BID  . pantoprazole (PROTONIX) IV  40 mg Intravenous Q24H   Continuous Infusions: . sodium chloride 100 mL/hr at 04/10/19 0605  . fentaNYL infusion INTRAVENOUS 175 mcg/hr (04/10/19 0605)  . piperacillin-tazobactam (ZOSYN)  IV 3.375 g (04/10/19  4709)  . propofol (DIPRIVAN) infusion 30 mcg/kg/min (04/10/19 0655)  . vancomycin 1,000 mg (04/10/19 1018)   PRN Meds:.acetaminophen **OR** acetaminophen, bisacodyl, fentaNYL, ipratropium-albuterol, midazolam, ondansetron **OR** ondansetron (ZOFRAN) IV Allergies  Allergen Reactions  . Aspirin Other (See Comments)    CHILDHOOD REACTION:UNKNOWN   Review of Systems  Unable to perform ROS: Intubated    Physical Exam Vitals signs and nursing note reviewed.  Constitutional:      General: She is not in acute distress.    Appearance: She is ill-appearing.     Interventions: She is intubated.  Cardiovascular:     Rate and Rhythm: Normal rate.  Pulmonary:     Effort: No tachypnea, accessory muscle usage or respiratory distress. She is intubated.     Breath sounds: Decreased breath sounds present.     Comments: Decreased breath sounds L > R Abdominal:     Palpations: Abdomen is soft.  Neurological:     Comments: Sedated on vent     Vital Signs: BP 126/63   Pulse 99   Temp 98.8 F (37.1 C) (Axillary)   Resp (!) 21   Ht 5' 4"  (1.626 m)   Wt 96.2 kg   SpO2 97%   BMI 36.40 kg/m  Pain Scale: CPOT   Pain Score: 6    SpO2: SpO2: 97 % O2 Device:SpO2: 97 % O2 Flow Rate: .O2 Flow Rate (L/min): 4 L/min  IO: Intake/output summary:   Intake/Output Summary (Last 24 hours) at 04/10/2019 1033 Last data filed at 04/10/2019 0605 Gross per 24 hour  Intake 2758.07 ml  Output 600 ml  Net 2158.07 ml    LBM: Last BM Date: 04/09/19 Baseline Weight: Weight: 109.8 kg Most recent weight: Weight: 96.2 kg     Palliative Assessment/Data:     Time In/Out: 0930-1000, 1430-1530 Time Total: 90 min  Greater than 50%  of this time was spent counseling and coordinating care related to the above assessment and plan.  Signed by: Vinie Sill, NP Palliative Medicine Team Pager # [phone] (M-F 8a-5p) Team Phone # [phone] (Nights/Weekends)

## 2019-04-10 NOTE — Progress Notes (Addendum)
Nutrition Follow-up  DOCUMENTATION CODES:   Morbid obesity  INTERVENTION:   -Vital High Protein @ 20 ml/hr via OGT    -60 ml Prostat TID  Tube feeding regimen provides 1080 kcal, 132 gr protein, and 389 ml of H2O.    Sedative contributing additional -528 kcal q 24 hr  NUTRITION DIAGNOSIS:   Inadequate oral intake related to inability to eat, acute illness(respiratory failure, sepsis, pneumonia) as evidenced by NPO status(intubated/sedated).  GOAL:   Provide needs based on ASPEN/SCCM guidelines   MONITOR:   Vent status, Labs, Weight trends, I & O's, TF tolerance  REASON FOR ASSESSMENT:   Malnutrition Screening Tool, Ventilator    ASSESSMENT: Patient is a 43 yo female with stage 4-breast cancer with metastasis. She presents septic with pneumonia and respiratory distress. Intubated, sedated with poor prognosis. Palliative medicine following. Currently a full code. Discussed pt with her nurse. Tube feeding being initiated.   Ventilator support MV: 9.6  L/min Temp (24hrs), Avg:98.7 F (37.1 C), Min:98.2 F (36.8 C), Max:99.2 F (37.3 C)  Propofol: 19.76 ml/hr @0655  this morning- provides 528 kcal q 24 hr at current rate.  Medications reviewed and include: solumedrol, protonix. Fentanyl.   Intake/Output Summary (Last 24 hours) at 04/10/2019 0917 Last data filed at 04/10/2019 [id number] Gross per 24 hour  Intake 2758.07 ml  Output 600 ml  Net 2158.07 ml  I/O-+2.521 liters since admission.   Weight hx: review shows gain of 9 kg compared to 10 days ago.  Nutrition-Focused physical exam: no fat depletion, no edema.   Labs: BMP-wnl BMP Latest Ref Rng & Units 04/10/2019 04/09/2019 04/07/2019  Glucose 70 - 99 mg/dL 108(H) 99 96  BUN 6 - 20 mg/dL 15 13 14   Creatinine 0.44 - 1.00 mg/dL 0.89 0.86 0.85  Sodium 135 - 145 mmol/L 139 139 140  Potassium 3.5 - 5.1 mmol/L 4.6 4.0 4.2  Chloride 98 - 111 mmol/L 101 100 101  CO2 22 - 32 mmol/L 26 28 29   Calcium 8.9 - 10.3 mg/dL 8.7(L) 9.0  9.1      Diet Order:   Diet Order            Diet NPO time specified  Diet effective now              EDUCATION NEEDS:   Not appropriate for education at this time   Skin:  Skin Assessment: Reviewed RN Assessment  Last BM:  8/31  Height:   Ht Readings from Last 1 Encounters:  04/09/19 5\' 4"  (1.626 m)    Weight:   Wt Readings from Last 1 Encounters:  04/10/19 96.2 kg    Ideal Body Weight:  55 kg  BMI:  Body mass index is 36.4 kg/m.  Estimated Nutritional Needs:   Kcal:  2025(based on 101 kg [employer])  Protein:  135-140 gr protein  Fluid:  per MD goal   Colman Cater MS,RD,CSG,LDN Office: [phone] Pager: [phone]

## 2019-04-10 NOTE — Progress Notes (Addendum)
Palliative:  Full note to follow. I have discussed with Ms. Ruan's mother, Tamela Oddi, who is her HCPOA. She has decided for no resuscitation but to continue ventilator support at this time. We did not place any further limitations for care except for no ACLS at this time. We have discussed likely need for one way extubation in the near future and plan to meet and discuss further tomorrow. At this time I am trying to connect Ms. Morain's 44 yo son with [facility] for support. I have offered to speak with her son even outside the hospital if desired and thought to be helpful and visitation will need to be allowed if desired given the extenuating circumstances. I will continue to follow and support Ms. Haris and her family in any way I am able.   Vinie Sill, NP Palliative Medicine Team Pager [phone] (Please see [email or web address] for schedule) Team Phone [phone]

## 2019-04-10 NOTE — Progress Notes (Signed)
WOC nurse called unit at [facility] stating Sharon Floyd is familiar and has treated PT's wounds to Left Breast. Orders for wound care to be ordered by [facility] RN. Continue to monitor site and maintain dressing.

## 2019-04-10 DEATH — deceased

## 2019-04-11 ENCOUNTER — Ambulatory Visit
Admission: RE | Admit: 2019-04-11 | Payer: BC Managed Care – PPO | Source: Ambulatory Visit | Admitting: Radiation Oncology

## 2019-04-11 ENCOUNTER — Inpatient Hospital Stay (HOSPITAL_COMMUNITY): Payer: BC Managed Care – PPO

## 2019-04-11 DIAGNOSIS — J9602 Acute respiratory failure with hypercapnia: Secondary | ICD-10-CM

## 2019-04-11 DIAGNOSIS — J9620 Acute and chronic respiratory failure, unspecified whether with hypoxia or hypercapnia: Secondary | ICD-10-CM

## 2019-04-11 LAB — BLOOD GAS, ARTERIAL
Acid-Base Excess: 0.9 mmol/L (ref 0.0–2.0)
Bicarbonate: 24.6 mmol/L (ref 20.0–28.0)
Drawn by: 317771
FIO2: 40
MECHVT: 440 mL
O2 Saturation: 92 %
PEEP: 5 cmH2O
Patient temperature: 37
RATE: 20 resp/min
pCO2 arterial: 52.7 mmHg — ABNORMAL HIGH (ref 32.0–48.0)
pH, Arterial: 7.319 — ABNORMAL LOW (ref 7.350–7.450)
pO2, Arterial: 74 mmHg — ABNORMAL LOW (ref 83.0–108.0)

## 2019-04-11 LAB — COMPREHENSIVE METABOLIC PANEL
ALT: 36 U/L (ref 0–44)
AST: 52 U/L — ABNORMAL HIGH (ref 15–41)
Albumin: 2.1 g/dL — ABNORMAL LOW (ref 3.5–5.0)
Alkaline Phosphatase: 149 U/L — ABNORMAL HIGH (ref 38–126)
Anion gap: 8 (ref 5–15)
BUN: 24 mg/dL — ABNORMAL HIGH (ref 6–20)
CO2: 25 mmol/L (ref 22–32)
Calcium: 8.5 mg/dL — ABNORMAL LOW (ref 8.9–10.3)
Chloride: 107 mmol/L (ref 98–111)
Creatinine, Ser: 0.84 mg/dL (ref 0.44–1.00)
GFR calc Af Amer: >60 mL/min (ref >60)
GFR calc non Af Amer: >60 mL/min (ref >60)
Glucose, Bld: 115 mg/dL — ABNORMAL HIGH (ref 70–99)
Potassium: 4.1 mmol/L (ref 3.5–5.1)
Sodium: 140 mmol/L (ref 135–145)
Total Bilirubin: 0.4 mg/dL (ref 0.3–1.2)
Total Protein: 6.4 g/dL — ABNORMAL LOW (ref 6.5–8.1)

## 2019-04-11 LAB — GLUCOSE, CAPILLARY
Glucose-Capillary: 103 mg/dL — ABNORMAL HIGH (ref 70–99)
Glucose-Capillary: 106 mg/dL — ABNORMAL HIGH (ref 70–99)
Glucose-Capillary: 111 mg/dL — ABNORMAL HIGH (ref 70–99)
Glucose-Capillary: 120 mg/dL — ABNORMAL HIGH (ref 70–99)
Glucose-Capillary: 86 mg/dL (ref 70–99)
Glucose-Capillary: 96 mg/dL (ref 70–99)

## 2019-04-11 LAB — CBC
HCT: 32.2 % — ABNORMAL LOW (ref 36.0–46.0)
Hemoglobin: 9.2 g/dL — ABNORMAL LOW (ref 12.0–15.0)
MCH: 26.1 pg (ref 26.0–34.0)
MCHC: 28.6 g/dL — ABNORMAL LOW (ref 30.0–36.0)
MCV: 91.5 fL (ref 80.0–100.0)
Platelets: 538 10*3/uL — ABNORMAL HIGH (ref 150–400)
RBC: 3.52 MIL/uL — ABNORMAL LOW (ref 3.87–5.11)
RDW: 18.8 % — ABNORMAL HIGH (ref 11.5–15.5)
WBC: 15 10*3/uL — ABNORMAL HIGH (ref 4.0–10.5)
nRBC: 0 % (ref 0.0–0.2)

## 2019-04-11 LAB — TRIGLYCERIDES: Triglycerides: 316 mg/dL — ABNORMAL HIGH (ref <150)

## 2019-04-11 MED ORDER — GLYCOPYRROLATE 0.2 MG/ML IJ SOLN
0.4000 mg | INTRAMUSCULAR | Status: DC
  Administered 2019-04-11 – 2019-04-12 (×3): 0.4 mg via INTRAVENOUS
  Filled 2019-04-11 (×4): qty 2

## 2019-04-11 MED ORDER — ENOXAPARIN SODIUM 40 MG/0.4ML ~~LOC~~ SOLN: 40.0000 mg | SUBCUTANEOUS | Status: DC

## 2019-04-11 NOTE — Progress Notes (Signed)
Daily Progress Note   Patient Name: Sharon Floyd       Date: 04/11/2019 DOB: May 14, 1976  Age: 43 y.o. MRN#: [id number] Attending Physician: Murlean Iba, MD Primary Care Physician: Baruch Gouty, FNP Admit Date: 03/15/2019  Reason for Consultation/Follow-up: Establishing goals of care  Subjective: Sedated on vent.   Length of Stay: 2  Current Medications: Scheduled Meds:  . chlorhexidine gluconate (MEDLINE KIT)  15 mL Mouth Rinse BID  . Chlorhexidine Gluconate Cloth  6 each Topical Daily  . [START ON 04-18-19] enoxaparin (LOVENOX) injection  40 mg Subcutaneous Q24H  . feeding supplement (PRO-STAT SUGAR FREE 64)  60 mL Per Tube TID  . feeding supplement (VITAL HIGH PROTEIN)  1,000 mL Per Tube Q24H  . gabapentin  900 mg Per Tube Q12H  . mouth rinse  15 mL Mouth Rinse 10 times per day  . methylPREDNISolone (SOLU-MEDROL) injection  40 mg Intravenous Q12H  . metoprolol tartrate  5 mg Intravenous Q8H  . mupirocin ointment   Nasal BID  . pantoprazole (PROTONIX) IV  40 mg Intravenous Q24H    Continuous Infusions: . sodium chloride 100 mL/hr at 04/11/19 0383  . fentaNYL infusion INTRAVENOUS 300 mcg/hr (04/11/19 0730)  . piperacillin-tazobactam (ZOSYN)  IV 3.375 g (04/11/19 0735)  . propofol (DIPRIVAN) infusion 45 mcg/kg/min (04/11/19 0740)  . vancomycin Stopped (04/11/19 0253)    PRN Meds: acetaminophen **OR** acetaminophen, bisacodyl, fentaNYL, ipratropium-albuterol, midazolam, ondansetron **OR** ondansetron (ZOFRAN) IV  Physical Exam Vitals signs and nursing note reviewed.  Constitutional:      General: She is not in acute distress.    Appearance: She is overweight. She is ill-appearing.     Interventions: She is sedated.  Cardiovascular:     Rate and Rhythm: Normal  rate.     Comments: Extremities warm to touch; pedal pulses strong Pulmonary:     Breath sounds: Examination of the right-upper field reveals rhonchi. Examination of the right-middle field reveals rhonchi. Examination of the right-lower field reveals rhonchi. Decreased breath sounds and rhonchi present.     Comments: Decreased L > R; lungs sounds worse than yesterday Abdominal:     General: Abdomen is flat.     Palpations: Abdomen is soft.  Neurological:     Comments: Sedated on vent  Vital Signs: BP 116/63   Pulse 90   Temp 98.3 F (36.8 C) (Axillary)   Resp (!) 0   Ht 5' 4"  (1.626 m)   Wt 98 kg   SpO2 98%   BMI 37.09 kg/m  SpO2: SpO2: 98 % O2 Device: O2 Device: Ventilator O2 Flow Rate: O2 Flow Rate (L/min): 4 L/min  Intake/output summary:   Intake/Output Summary (Last 24 hours) at 04/11/2019 1005 Last data filed at 04/11/2019 5176 Gross per 24 hour  Intake 3326.36 ml  Output 1275 ml  Net 2051.36 ml   LBM: Last BM Date: 04/09/19 Baseline Weight: Weight: 109.8 kg Most recent weight: Weight: 98 kg       Palliative Assessment/Data:      Patient Active Problem List   Diagnosis Date Noted  . Sepsis ([place]) 04/09/2019  . Sepsis due to pneumonia ([place]) 04/09/2019  . Pneumonia due to infectious organism   . Thrombocytosis ([place])   . Antineoplastic chemotherapy induced anemia   . Acute on chronic respiratory failure with hypoxia ([place]) 03/06/2019  . Genetic testing 11/16/2018  . Normocytic anemia 11/03/2018  . Goals of care, counseling/discussion 11/03/2018  . Cancer-related pain 10/24/2018  . Metastasis to brain ([place])   . Pleural effusion, malignant 10/18/2018  . Elevated troponin 10/18/2018  . Malignant neoplasm of left female breast ([place])   . Acute respiratory failure with hypoxia ([place]) 10/17/2018  . Family history of breast cancer   . Family history of colon cancer   . Family history of cancer 10/12/2018  . Metastatic breast cancer ([place]) 10/10/2018     Palliative Care Assessment & Plan   HPI: 43 y.o. female  with past medical history of progressing stage 4 breast cancer (mets to lung, brain, liver, bilateral pleural effusions, progressive thoracic adenopathy), s/p R PleurX catheter, arthritis admitted on 03/13/2019 with worsening SOB requiring intubation after failing BiPAP trial. Complete left lung white out with progressing cancer and likely postobstructive pneumonia. Planning for potential one way extubation to comfort.   Assessment: I met today at "Sharon Floyd's" bedside but after I had a discussion with her son, Mylo Red, and her mother, Sharon Floyd. We discussed with Sharon Floyd that his mother is critically ill and her lungs are failing because of her cancer. We explained that we are not able to get her better and that she is at the end of her life. I explained that we do not believe that she has long left to live and could be anytime now. This comes as a shock to him but he is processing this information. He was given time to ask questions. I gave him permission to feel angry or sad and to acknowledge that this is not fair.   I explained his mother's condition and explained the vent and tubes/wires that he would see upon entering her room. I accompanied them to the room and we allowed [facility] time alone with his mother. I encouraged him to speak to her if desired as she can hear him but unable to respond.   I spoke with Sharon Floyd outside the room and we discussed the next step is to extubate from ventilator. She confirms from our discussion yesterday that Sharon Floyd would not want to be prolonged on machines if she is not improving. She plans to speak with her stepson as he would be the one to accompany her to extubation. Will also allow Sharon Floyd whatever time he needs to say goodbye to his mother.   All questions/concerns addressed. Emotional support provided. Chaplain was present  for support during visit. Sharon Floyd is connected with Chief Strategy Officer for grief support.    Update: Doris called me with plans for one way extubation to comfort tomorrow 9/3 9-10am. I will be present to facilitate.   Recommendations/Plan:  IVF KVO, scheduled robinul, and d/c tube feedings in preparation for one way extubation.   Consider transition to dilaudid infusion prior to extubation given high doses of fentanyl required to achieve comfort.   Code Status:  Limited code - continue vent  Prognosis:   Hours - Days  Discharge Planning:  Anticipated Hospital Death  Care plan was discussed with RN, Dr. Wynetta Emery.   Thank you for allowing the Palliative Medicine Team to assist in the care of this patient.   Time In: 1500 Time Out: 1620 Total Time 80 min Prolonged Time Billed  yes       Greater than 50%  of this time was spent counseling and coordinating care related to the above assessment and plan.  Vinie Sill, NP Palliative Medicine Team Pager # [phone] (M-F 8a-5p) Team Phone # [phone] (Nights/Weekends)

## 2019-04-11 NOTE — Progress Notes (Signed)
PROGRESS NOTE   Sharon Floyd  HAF:[id number]  DOB: 01/17/1976  DOA: 03/10/2019 PCP: Baruch Gouty, FNP   Brief Admission Hx: 43 year old female with metastatic inflammatory breast cancer presented with acute on chronic respiratory failure and subsequently intubated in the hospital due to worsening shortness of breath.  Unfortunately her breast cancer has not been responsive to aggressive chemotherapy and treatments and according to her oncologist she is no longer a candidate for further treatment given that it is a not expected to improve her prognosis.  Patient is being treated for presumed postobstructive pneumonia and after family has been meeting with palliative medicine they have elected to focus on comfort care and now understand that patient is unlikely to have meaningful improvement.  MDM/Assessment & Plan:   1. Severe sepsis secondary to postobstructive pneumonia-patient has been treated maximally with inpatient treatments and not having any significant or meaningful improvements.  Patient appears terminal and likely will be extubated after family discussions with palliative. 2. Acute hypoxemic respiratory failure - multifactorial but likely secondary to metastatic breast cancer.  Appreciate assistance from pulmonology team.  3. 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 Maylon Peppers.   4. Obesity - stable, patient is being tube fed at this time.   DVT prophylaxis: lovenox  Code Status: Partial  Family Communication: none at bedside during rounds  Disposition Plan: likely terminal, further palliative discussions today  Consultants:  Palliative medicine  Pulmonology  Procedures:    Subjective: Pt fully sedated on vent  Objective: Vitals:   04/11/19 0830 04/11/19 0845 04/11/19 0900 04/11/19 0945  BP: 105/78 115/66 109/80 116/63  Pulse:  92 93 92 90  Resp: _0 (!) 0  Temp:      TempSrc:      SpO2: 97% 96% 97% 98%  Weight:      Height:        Intake/Output Summary (Last 24 hours) at 04/11/2019 0957 Last data filed at 04/11/2019 3291 Gross per 24 hour  Intake 3326.36 ml  Output 1275 ml  Net 2051.36 ml   Filed Weights   03/29/2019 2051 04/10/19 0430 04/11/19 0500  Weight: 109.8 kg 96.2 kg 98 kg   REVIEW OF SYSTEMS  UTO due to condition   Exam:  General exam: critically ill female intubated on vent.  Sedated.  Respiratory system: poor air movement.  Cardiovascular system: S1 & S2 heard. No JVD, murmurs, gallops, clicks or pedal edema. Gastrointestinal system: Abdomen is nondistended, soft and nontender. Normal bowel sounds heard. Central nervous system:Sedated. No focal neurological deficits. Extremities: no CCE.  Data Reviewed: Basic Metabolic Panel: Recent Labs  Lab 03/27/2019 2146 04/09/19 0721 04/10/19 0426 04/11/19 0453  NA 140 139 139 140  K 4.2 4.0 4.6 4.1  CL 101 100 101 107  CO2 _1 GLUCOSE 96 99 108* 115*  BUN _2 24*  CREATININE 0.85 0.86 0.89 0.84  CALCIUM 9.1 9.0 8.7* 8.5*  MG  --  2.2  --   --    Liver Function Tests: Recent Labs  Lab 03/13/2019 2146 04/09/19 0721 04/10/19 0426 04/11/19 0453  AST 49* 46* 56* 52*  ALT _3 36  ALKPHOS 145* 142* 197* 149*  BILITOT 0.3 0.2* 0.3 0.4  PROT 6.9 7.0 6.6 6.4*  ALBUMIN 2.4* 2.3* 2.1* 2.1*   No results for input(s): LIPASE, AMYLASE in the last  168 hours. No results for input(s): AMMONIA in the last 168 hours. CBC: Recent Labs  Lab 03/10/2019 2146 04/09/19 0721 04/10/19 0426 04/11/19 0453  WBC 9.7 9.3 10.0 15.0*  NEUTROABS 8.1*  --   --   --   HGB 10.5* 10.2* 9.5* 9.2*  HCT 36.3 36.3 33.2* 32.2*  MCV 89.6 90.3 91.0 91.5  PLT 548* 517* 554* 538*   Cardiac Enzymes: No results for input(s): CKTOTAL, CKMB, CKMBINDEX, TROPONINI in the last 168 hours. CBG (last 3)  Recent Labs    04/11/19 0002 04/11/19 0500  04/11/19 0748  GLUCAP 106* 111* 103*   Recent Results (from the past 240 hour(s))  Urine culture     Status: Abnormal   Collection Time: 03/17/2019  9:24 PM   Specimen: Urine, Catheterized  Result Value Ref Range Status   Specimen Description   Final    URINE, CATHETERIZED Performed at [facility], [address] [place], [place] [address]    Special Requests   Final    NONE Performed at , [address] [place], [place] [address]    [place], SUGGEST RECOLLECTION (A)  Final   Report Status 04/10/2019 FINAL  Final  Blood culture (routine x 2)     Status: None (Preliminary result)   Collection Time: 03/14/2019  9:46 PM   Specimen: A-Line; Blood  Result Value Ref Range Status   Specimen Description A-LINE  Final   Special Requests   Final    BOTTLES DRAWN AEROBIC AND ANAEROBIC Blood Culture adequate volume   Culture   Final    NO GROWTH 2 DAYS Performed at [facility], [address], [place], [place] [address]    Report Status PENDING  Incomplete  Blood culture (routine x 2)     Status: None (Preliminary result)   Collection Time: 03/10/2019  9:46 PM   Specimen: A-Line; Blood  Result Value Ref Range Status   Specimen Description A-LINE  Final   Special Requests   Final    BOTTLES DRAWN AEROBIC AND ANAEROBIC Blood Culture adequate volume   Culture   Final    NO GROWTH 2 DAYS Performed at [facility], [address], [place], [place] [address]    Report Status PENDING  Incomplete  SARS Coronavirus 2 [facility] order, Performed in [place] hospital lab) Nasopharyngeal Nasopharyngeal Swab     Status: None   Collection Time: 03/16/2019 11:25 PM   Specimen: Nasopharyngeal Swab  Result Value Ref Range Status   SARS Coronavirus 2 NEGATIVE NEGATIVE Final    Comment: (NOTE) If result is NEGATIVE SARS-CoV-2 target nucleic acids are NOT DETECTED. The SARS-CoV-2 RNA is generally detectable in upper and lower  respiratory specimens during the acute  phase of infection. The lowest  concentration of SARS-CoV-2 viral copies this assay can detect is 250  copies / mL. A negative result does not preclude SARS-CoV-2 infection  and should not be used as the sole basis for treatment or other  patient management decisions.  A negative result may occur with  improper specimen collection / handling, submission of specimen other  than nasopharyngeal swab, presence of viral mutation(s) within the  areas targeted by this assay, and inadequate number of viral copies  (<250 copies / mL). A negative result must be combined with clinical  observations, patient history, and epidemiological information. If result is POSITIVE SARS-CoV-2 target nucleic acids are DETECTED. The SARS-CoV-2 RNA is generally detectable in upper and lower  respiratory specimens dur  ing the acute phase of infection.  Positive  results are indicative of active infection with SARS-CoV-2.  Clinical  correlation with patient history and other diagnostic information is  necessary to determine patient infection status.  Positive results do  not rule out bacterial infection or co-infection with other viruses. If result is PRESUMPTIVE POSTIVE SARS-CoV-2 nucleic acids MAY BE PRESENT.   A presumptive positive result was obtained on the submitted specimen  and confirmed on repeat testing.  While 2019 novel coronavirus  (SARS-CoV-2) nucleic acids may be present in the submitted sample  additional confirmatory testing may be necessary for epidemiological  and / or clinical management purposes  to differentiate between  SARS-CoV-2 and other Sarbecovirus currently known to infect humans.  If clinically indicated additional testing with an alternate test  methodology [phone]) is advised. The SARS-CoV-2 RNA is generally  detectable in upper and lower respiratory sp ecimens during the acute  phase of infection. The expected result is Negative. Fact Sheet for Patients:   StrictlyIdeas.no Fact Sheet for Healthcare Providers: BankingDealers.co.za This test is not yet approved or cleared by the [place] FDA and has been authorized for detection and/or diagnosis of SARS-CoV-2 by FDA under an Emergency Use Authorization (EUA).  This EUA will remain in effect (meaning this test can be used) for the duration of the COVID-19 declaration under Section 564(b)(1) of the Act, 21 U.S.C. section 360bbb-3(b)(1), unless the authorization is terminated or revoked sooner. Performed at [facility], [address], [place], [place] [address]   MRSA PCR Screening     Status: Abnormal   Collection Time: 04/09/19  6:18 AM   Specimen: Nasal Mucosa; Nasopharyngeal  Result Value Ref Range Status   MRSA by PCR POSITIVE (A) NEGATIVE Final    Comment:        The GeneXpert MRSA Assay (FDA approved for NASAL specimens only), is one component of a comprehensive MRSA colonization surveillance program. It is not intended to diagnose MRSA infection nor to guide or monitor treatment for MRSA infections. CALLED TO [facility] SMITH AT [phone] BY HFLYNT 04/09/19 Performed at [facility], [address], [place], [place] [address]      Studies: Portable Chest Xray  Result Date: 04/10/2019 CLINICAL DATA:  Metastatic breast cancer, respiratory failure. EXAM: PORTABLE CHEST 1 VIEW COMPARISON:  04/09/2019 FINDINGS: Endotracheal tube tip is 2.6 cm above the carina. Nasogastric tube enters the stomach. Power injectable right Port-A-Cath tip: Cavoatrial junction. Stable right basilar pleural drainage catheter. The patient is rotated to the left on today's radiograph, reducing diagnostic sensitivity and specificity. Complete opacification of the left hemithorax, as before. Numerous scattered pulmonary nodules and masses in the right lung. Stable mild hazy density in the right lung potentially from mild edema. Sclerotic osseous metastatic disease for  example involving the right distal clavicle. IMPRESSION: 1. Similar appearance to yesterday's exam with complete opacification of the left hemithorax and prominent scattered pulmonary nodules and masses in the right superimposed on some indistinctness of the right pulmonary vasculature which may reflect mild edema. 2. Osseous metastatic disease similar to prior. 3. Tubes and lines appear satisfactorily position. Electronically Signed   By: Van Clines M.D.   On: 04/10/2019 08:20   Scheduled Meds: . chlorhexidine gluconate (MEDLINE KIT)  15 mL Mouth Rinse BID  . Chlorhexidine Gluconate Cloth  6 each Topical Daily  . [START ON 2019-04-19] enoxaparin (LOVENOX) injection  40 mg Subcutaneous Q24H  . feeding supplement (PRO-STAT SUGAR FREE 64)  60 mL Per Tube TID  .  feeding supplement (VITAL HIGH PROTEIN)  1,000 mL Per Tube Q24H  . gabapentin  900 mg Per Tube Q12H  . mouth rinse  15 mL Mouth Rinse 10 times per day  . methylPREDNISolone (SOLU-MEDROL) injection  40 mg Intravenous Q12H  . metoprolol tartrate  5 mg Intravenous Q8H  . mupirocin ointment   Nasal BID  . pantoprazole (PROTONIX) IV  40 mg Intravenous Q24H   Continuous Infusions: . sodium chloride 100 mL/hr at 04/11/19 4935  . fentaNYL infusion INTRAVENOUS 300 mcg/hr (04/11/19 0730)  . piperacillin-tazobactam (ZOSYN)  IV 3.375 g (04/11/19 0735)  . propofol (DIPRIVAN) infusion 45 mcg/kg/min (04/11/19 0740)  . vancomycin Stopped (04/11/19 0253)   Active Problems:   Sepsis ([place])   Sepsis due to pneumonia [facility])  Critical Care Time spent: 48 mins  Irwin Brakeman, MD [facility] 04/11/2019, 9:57 AM    LOS: 2 days  How to contact the [facility] Attending or Consulting provider [place] or covering provider during after hours [place], for this patient?  1. Check the care team in [facility] and look for a) attending/consulting TR[facility] provider listed and b) the [facility] team listed 2. Log into [email or web address] and use [place] universal password to  access. If you do not have the password, please contact the hospital operator. 3. Locate the [facility] provider you are looking for under [facility] and page to a number that you can be directly reached. 4. If you still have difficulty reaching the provider, please page the [facility] (Director on Call) for the Hospitalists listed on amion for assistance.

## 2019-04-11 NOTE — Progress Notes (Signed)
Patient's mother, Rosangela Garrette), called and stated that she will be here in the morning and would like to have the ventilator tube removed at around [place] she get's here (patient's brother is coming too). Writer called Dr. Wynetta Emery and made him aware.

## 2019-04-11 NOTE — Progress Notes (Signed)
Subjective: She remains intubated and on the ventilator.  Palliative care consult noted she is on pretty much maximum dose of propofol and fentanyl and is still having to receive fentanyl pushes.  She also has Versed available if needed.  She still has episodes of being aroused.  Objective: Vital signs in last 24 hours: Temp:  [98.3 F (36.8 C)-99.4 F (37.4 C)] 98.3 F (36.8 C) (09/02 0400) Pulse Rate:  [88-128] 93 (09/02 0630) Resp:  [0-31] 15 (09/02 0630) BP: (84-144)/(40-108) 127/68 (09/02 0630) SpO2:  [94 %-99 %] 95 % (09/02 0630) FiO2 (%):  [40 %] 40 % (09/02 0400) Weight:  [98 kg] 98 kg (09/02 0500) Weight change: 1.8 kg Last BM Date: 04/09/19  Intake/Output from previous day: 09/01 0701 - 09/02 0700 In: 3326.4 [I.V.:2568.9; IV Piggyback:757.5] Out: 1275 [Urine:750; Drains:525]  PHYSICAL EXAM General appearance: Intubated sedated on mechanical ventilation Resp: Diminished breath sounds on the left significant rhonchi on the right Cardio: regular rate and rhythm, S1, S2 normal, no murmur, click, rub or gallop GI: soft, non-tender; bowel sounds normal; no masses,  no organomegaly Extremities: Starting to have some third spacing  Lab Results:  Results for orders placed or performed during the hospital encounter of 03/21/2019 (from the past 48 hour(s))  Glucose, capillary     Status: Abnormal   Collection Time: 04/09/19  8:15 AM  Result Value Ref Range   Glucose-Capillary 111 (H) 70 - 99 mg/dL  Glucose, capillary     Status: Abnormal   Collection Time: 04/09/19 11:36 AM  Result Value Ref Range   Glucose-Capillary 104 (H) 70 - 99 mg/dL  Pregnancy, urine     Status: None   Collection Time: 04/09/19  4:45 PM  Result Value Ref Range   Preg Test, Ur NEGATIVE NEGATIVE    Comment:        THE SENSITIVITY OF THIS METHODOLOGY IS >20 mIU/mL. Performed at [facility], [address], [place], [place] [address]   Glucose, capillary     Status: Abnormal   Collection Time:  04/09/19  4:46 PM  Result Value Ref Range   Glucose-Capillary 119 (H) 70 - 99 mg/dL  Glucose, capillary     Status: Abnormal   Collection Time: 04/09/19  7:44 PM  Result Value Ref Range   Glucose-Capillary 104 (H) 70 - 99 mg/dL  Glucose, capillary     Status: Abnormal   Collection Time: 04/10/19 12:04 AM  Result Value Ref Range   Glucose-Capillary 106 (H) 70 - 99 mg/dL  Glucose, capillary     Status: Abnormal   Collection Time: 04/10/19  4:05 AM  Result Value Ref Range   Glucose-Capillary 112 (H) 70 - 99 mg/dL  Triglycerides     Status: Abnormal   Collection Time: 04/10/19  4:26 AM  Result Value Ref Range   Triglycerides 259 (H) <150 mg/dL    Comment: Performed at [facility], [address] [place], [place] [address]  Comprehensive metabolic panel     Status: Abnormal   Collection Time: 04/10/19  4:26 AM  Result Value Ref Range   Sodium 139 135 - 145 mmol/L   Potassium 4.6 3.5 - 5.1 mmol/L   Chloride 101 98 - 111 mmol/L   CO2 26 22 - 32 mmol/L   Glucose, Bld 108 (H) 70 - 99 mg/dL   BUN 15 6 - 20 mg/dL   Creatinine, Ser 0.89 0.44 - 1.00 mg/dL   Calcium 8.7 (L) 8.9 - 10.3 mg/dL   Total Protein  6.6 6.5 - 8.1 g/dL   Albumin 2.1 (L) 3.5 - 5.0 g/dL   AST 56 (H) 15 - 41 U/L   ALT 30 0 - 44 U/L   Alkaline Phosphatase 197 (H) 38 - 126 U/L   Total Bilirubin 0.3 0.3 - 1.2 mg/dL   GFR calc non Af Amer >60 >60 mL/min   GFR calc Af Amer >60 >60 mL/min   Anion gap 12 5 - 15    Comment: Performed at [facility], [address] [place], [place] [address]  CBC     Status: Abnormal   Collection Time: 04/10/19  4:26 AM  Result Value Ref Range   WBC 10.0 4.0 - 10.5 K/uL   RBC 3.65 (L) 3.87 - 5.11 MIL/uL   Hemoglobin 9.5 (L) 12.0 - 15.0 g/dL   HCT 33.2 (L) 36.0 - 46.0 %   MCV 91.0 80.0 - 100.0 fL   MCH 26.0 26.0 - 34.0 pg   MCHC 28.6 (L) 30.0 - 36.0 g/dL   RDW 18.7 (H) 11.5 - 15.5 %   Platelets 554 (H) 150 - 400 K/uL   nRBC 0.0 0.0 - 0.2 %    Comment: Performed at [facility], [address] [place], [place] [address]  Lactic acid, plasma     Status: None   Collection Time: 04/10/19  4:26 AM  Result Value Ref Range   Lactic Acid, Venous 1.8 0.5 - 1.9 mmol/L    Comment: Performed at [facility], [address] [place], [place] [address]  Blood gas, arterial     Status: Abnormal   Collection Time: 04/10/19  5:18 AM  Result Value Ref Range   FIO2 50.00    Delivery systems VENTILATOR    Mode PRESSURE REGULATED VOLUME CONTROL    VT 440 mL   LHR 20 resp/min   Peep/cpap 5.0 cm H20   pH, Arterial 7.370 7.350 - 7.450   pCO2 arterial 49.2 (H) 32.0 - 48.0 mmHg   pO2, Arterial 112 (H) 83.0 - 108.0 mmHg   Bicarbonate 26.6 20.0 - 28.0 mmol/L   Acid-Base Excess 2.9 (H) 0.0 - 2.0 mmol/L   O2 Saturation 97.3 %   Patient temperature 37.0    Collection site RIGHT RADIAL    Drawn by [id number]    Allens test (pass/fail) PASS PASS    Comment: Performed at [facility] [address], [place],  [address]  Glucose, capillary     Status: Abnormal   Collection Time: 04/10/19  7:34 AM  Result Value Ref Range   Glucose-Capillary 105 (H) 70 - 99 mg/dL  Glucose, capillary     Status: Abnormal   Collection Time: 04/10/19 12:03 PM  Result Value Ref Range   Glucose-Capillary 124 (H) 70 - 99 mg/dL  Glucose, capillary     Status: Abnormal   Collection Time: 04/10/19  4:35 PM  Result Value Ref Range   Glucose-Capillary 101 (H) 70 - 99 mg/dL  Glucose, capillary     Status: None   Collection Time: 04/10/19  8:04 PM  Result Value Ref Range   Glucose-Capillary 97 70 - 99 mg/dL  Glucose, capillary     Status: Abnormal   Collection Time: 04/11/19 12:02 AM  Result Value Ref Range   Glucose-Capillary 106 (H) 70 - 99 mg/dL  Blood gas, arterial     Status: Abnormal   Collection Time: 04/11/19  4:14 AM  Result Value Ref Range   FIO2 40.00    Delivery systems VENTILATOR  Mode PRESSURE REGULATED VOLUME CONTROL    VT 440 mL   LHR 20 resp/min   Peep/cpap 5.0 cm H20   pH,  Arterial 7.319 (L) 7.350 - 7.450   pCO2 arterial 52.7 (H) 32.0 - 48.0 mmHg   pO2, Arterial 74.0 (L) 83.0 - 108.0 mmHg   Bicarbonate 24.6 20.0 - 28.0 mmol/L   Acid-Base Excess 0.9 0.0 - 2.0 mmol/L   O2 Saturation 92.0 %   Patient temperature 37.0    Collection site RIGHT RADIAL    Drawn by [id number]    Allens test (pass/fail) PASS PASS    Comment: Performed at [facility], [address], [place], [place] [address]  Triglycerides     Status: Abnormal   Collection Time: 04/11/19  4:53 AM  Result Value Ref Range   Triglycerides 316 (H) <150 mg/dL    Comment: Performed at [facility], [address], [place], [place] [address]  Comprehensive metabolic panel     Status: Abnormal   Collection Time: 04/11/19  4:53 AM  Result Value Ref Range   Sodium 140 135 - 145 mmol/L   Potassium 4.1 3.5 - 5.1 mmol/L   Chloride 107 98 - 111 mmol/L   CO2 25 22 - 32 mmol/L   Glucose, Bld 115 (H) 70 - 99 mg/dL   BUN 24 (H) 6 - 20 mg/dL   Creatinine, Ser 0.84 0.44 - 1.00 mg/dL   Calcium 8.5 (L) 8.9 - 10.3 mg/dL   Total Protein 6.4 (L) 6.5 - 8.1 g/dL   Albumin 2.1 (L) 3.5 - 5.0 g/dL   AST 52 (H) 15 - 41 U/L   ALT 36 0 - 44 U/L   Alkaline Phosphatase 149 (H) 38 - 126 U/L   Total Bilirubin 0.4 0.3 - 1.2 mg/dL   GFR calc non Af Amer >60 >60 mL/min   GFR calc Af Amer >60 >60 mL/min   Anion gap 8 5 - 15    Comment: Performed at [facility], [address] [place], [place] [address]  CBC     Status: Abnormal   Collection Time: 04/11/19  4:53 AM  Result Value Ref Range   WBC 15.0 (H) 4.0 - 10.5 K/uL   RBC 3.52 (L) 3.87 - 5.11 MIL/uL   Hemoglobin 9.2 (L) 12.0 - 15.0 g/dL   HCT 32.2 (L) 36.0 - 46.0 %   MCV 91.5 80.0 - 100.0 fL   MCH 26.1 26.0 - 34.0 pg   MCHC 28.6 (L) 30.0 - 36.0 g/dL   RDW 18.8 (H) 11.5 - 15.5 %   Platelets 538 (H) 150 - 400 K/uL   nRBC 0.0 0.0 - 0.2 %    Comment: Performed at [facility], [address], [place], [place] [address]  Glucose, capillary     Status: Abnormal   Collection  Time: 04/11/19  5:00 AM  Result Value Ref Range   Glucose-Capillary 111 (H) 70 - 99 mg/dL    ABGS Recent Labs    04/11/19 0414  PHART 7.319*  PO2ART 74.0*  HCO3 24.6   CULTURES Recent Results (from the past 240 hour(s))  Urine culture     Status: Abnormal   Collection Time: 04/09/2019  9:24 PM   Specimen: Urine, Catheterized  Result Value Ref Range Status   Specimen Description   Final    URINE, CATHETERIZED Performed at [facility], [address], [place], [place] [address]    Special Requests   Final    NONE Performed at [facility], [address], [place], [place] [address]    Culture MULTIPLE SPECIES PRESENT, SUGGEST RECOLLECTION (A)  Final   Report Status 04/10/2019 FINAL  Final  Blood culture (routine x 2)     Status: None (Preliminary result)   Collection Time: 03/28/2019  9:46 PM   Specimen: A-Line; Blood  Result Value Ref Range Status   Specimen Description A-LINE  Final   Special Requests   Final    BOTTLES DRAWN AEROBIC AND ANAEROBIC Blood Culture adequate volume   Culture   Final    NO GROWTH 2 DAYS Performed at [facility], [address] [place], [place] [address]    Report Status PENDING  Incomplete  Blood culture (routine x 2)     Status: None (Preliminary result)   Collection Time: 03/13/2019  9:46 PM   Specimen: A-Line; Blood  Result Value Ref Range Status   Specimen Description A-LINE  Final   Special Requests   Final    BOTTLES DRAWN AEROBIC AND ANAEROBIC Blood Culture adequate volume   Culture   Final    NO GROWTH 2 DAYS Performed at [facility], [address], [place], [place] [address]    Report Status PENDING  Incomplete  SARS Coronavirus 2 [facility] order, Performed in [place] hospital lab) Nasopharyngeal Nasopharyngeal Swab     Status: None   Collection Time: 04/04/2019 11:25 PM   Specimen: Nasopharyngeal Swab  Result Value Ref Range Status   SARS Coronavirus 2 NEGATIVE NEGATIVE Final    Comment: (NOTE) If result is  NEGATIVE SARS-CoV-2 target nucleic acids are NOT DETECTED. The SARS-CoV-2 RNA is generally detectable in upper and lower  respiratory specimens during the acute phase of infection. The lowest  concentration of SARS-CoV-2 viral copies this assay can detect is 250  copies / mL. A negative result does not preclude SARS-CoV-2 infection  and should not be used as the sole basis for treatment or other  patient management decisions.  A negative result may occur with  improper specimen collection / handling, submission of specimen other  than nasopharyngeal swab, presence of viral mutation(s) within the  areas targeted by this assay, and inadequate number of viral copies  (<250 copies / mL). A negative result must be combined with clinical  observations, patient history, and epidemiological information. If result is POSITIVE SARS-CoV-2 target nucleic acids are DETECTED. The SARS-CoV-2 RNA is generally detectable in upper and lower  respiratory specimens dur ing the acute phase of infection.  Positive  results are indicative of active infection with SARS-CoV-2.  Clinical  correlation with patient history and other diagnostic information is  necessary to determine patient infection status.  Positive results do  not rule out bacterial infection or co-infection with other viruses. If result is PRESUMPTIVE POSTIVE SARS-CoV-2 nucleic acids MAY BE PRESENT.   A presumptive positive result was obtained on the submitted specimen  and confirmed on repeat testing.  While 2019 novel coronavirus  (SARS-CoV-2) nucleic acids may be present in the submitted sample  additional confirmatory testing may be necessary for epidemiological  and / or clinical management purposes  to differentiate between  SARS-CoV-2 and other Sarbecovirus currently known to infect humans.  If clinically indicated additional testing with an alternate test  methodology [phone]) is advised. The SARS-CoV-2 RNA is generally  detectable  in upper and lower respiratory sp ecimens during the acute  phase of infection. The expected result is Negative. Fact Sheet for Patients:  StrictlyIdeas.no Fact Sheet for Healthcare Providers: BankingDealers.co.za This  test is not yet approved or cleared by the [place] and has been authorized for detection and/or diagnosis of SARS-CoV-2 by FDA under an Emergency Use Authorization (EUA).  This EUA will remain in effect (meaning this test can be used) for the duration of the COVID-19 declaration under Section 564(b)(1) of the Act, 21 U.S.C. section 360bbb-3(b)(1), unless the authorization is terminated or revoked sooner. Performed at [facility], [address], [place], [place] [address]   MRSA PCR Screening     Status: Abnormal   Collection Time: 04/09/19  6:18 AM   Specimen: Nasal Mucosa; Nasopharyngeal  Result Value Ref Range Status   MRSA by PCR POSITIVE (A) NEGATIVE Final    Comment:        The GeneXpert MRSA Assay (FDA approved for NASAL specimens only), is one component of a comprehensive MRSA colonization surveillance program. It is not intended to diagnose MRSA infection nor to guide or monitor treatment for MRSA infections. CALLED TO [facility] AT 2229 BY HFLYNT 04/09/19 Performed at [facility], [address], [place], [place] [address]    Studies/Results: Portable Chest Xray  Result Date: 04/10/2019 CLINICAL DATA:  Metastatic breast cancer, respiratory failure. EXAM: PORTABLE CHEST 1 VIEW COMPARISON:  04/09/2019 FINDINGS: Endotracheal tube tip is 2.6 cm above the carina. Nasogastric tube enters the stomach. Power injectable right Port-A-Cath tip: Cavoatrial junction. Stable right basilar pleural drainage catheter. The patient is rotated to the left on today's radiograph, reducing diagnostic sensitivity and specificity. Complete opacification of the left hemithorax, as before. Numerous scattered pulmonary  nodules and masses in the right lung. Stable mild hazy density in the right lung potentially from mild edema. Sclerotic osseous metastatic disease for example involving the right distal clavicle. IMPRESSION: 1. Similar appearance to yesterday's exam with complete opacification of the left hemithorax and prominent scattered pulmonary nodules and masses in the right superimposed on some indistinctness of the right pulmonary vasculature which may reflect mild edema. 2. Osseous metastatic disease similar to prior. 3. Tubes and lines appear satisfactorily position. Electronically Signed   By: Van Clines M.D.   On: 04/10/2019 08:20   Portable Chest X-ray  Result Date: 04/09/2019 CLINICAL DATA:  Intubation. Metastatic breast cancer. EXAM: PORTABLE CHEST 1 VIEW 8:37 a.m. COMPARISON:  04/05/2019 at 9:21 p.m. and CT scan of the chest dated 04/09/2019 was FINDINGS: Endotracheal tube tip appears in good position approximately 5 cm above the carina. NG tube tip is below the diaphragm. Power port in place with the tip at the cavoatrial junction, unchanged. There is persistent complete opacification of the left hemithorax. Numerous metastatic lesions are noted in the right lung as demonstrated on the recent CT scan. IMPRESSION: Endotracheal tube in good position. Persistent complete opacification of the left hemithorax. Extensive metastatic lesions in the right lung. Electronically Signed   By: Lorriane Shire M.D.   On: 04/09/2019 08:59    Medications:  Prior to Admission:  Medications Prior to Admission  Medication Sig Dispense Refill Last Dose  . cyclobenzaprine (FLEXERIL) 10 MG tablet Take 1 tablet (10 mg total) by mouth 3 (three) times daily as needed for muscle spasms. 30 tablet 0 unknown  . gabapentin (NEURONTIN) 300 MG capsule Take 3 capsules (900 mg total) by mouth 2 (two) times daily. 180 capsule 5 04/01/2019 at Unknown time  . guaiFENesin (MUCINEX) 600 MG 12 hr tablet Take 600 mg by mouth 2 (two)  times daily.   04/09/2019 at Unknown time  . ipratropium (ATROVENT HFA) 17  MCG/ACT inhaler Inhale 2 puffs into the lungs every 4 (four) hours as needed for wheezing. 1 Inhaler 12 03/30/2019 at Unknown time  . lidocaine-prilocaine (EMLA) cream Apply to affected area once 30 g 3 unknown  . LORazepam (ATIVAN) 0.5 MG tablet Take 1 tablet (0.5 mg total) by mouth every 6 (six) hours as needed (Nausea or vomiting). 50 tablet 0 04/06/2019 at Unknown time  . metoprolol tartrate (LOPRESSOR) 25 MG tablet Take 0.5 tablets (12.5 mg total) by mouth 2 (two) times daily. 30 tablet 1 03/12/2019 at 1900  . oxyCODONE (OXY IR/ROXICODONE) 5 MG immediate release tablet Take 2 tablets (10 mg total) by mouth every 4 (four) hours as needed for up to 10 days for severe pain. 120 tablet 0 03/19/2019 at Unknown time  . prochlorperazine (COMPAZINE) 10 MG tablet Take 1 tablet (10 mg total) by mouth every 6 (six) hours as needed (Nausea or vomiting). 30 tablet 4 unknown  . UNABLE TO FIND As per Medical necessity, Mastectomy Bra Q#2-Dx Code C50.812, Z17.1 Malignant neoplasm of overlapping sites of left breast in female, estrogen receptor negative 2 each 0 unknown  . dexamethasone (DECADRON) 4 MG tablet Take 2 tablets (8 mg total) by mouth daily. Start the day after carboplatin chemotherapy for 3 days. (Patient not taking: Reported on 04/04/2019) 30 tablet 1 Not Taking at Unknown time  . fentaNYL (DURAGESIC) 50 MCG/HR Place 1 patch onto the skin every 3 (three) days. (Patient not taking: Reported on 04/04/2019) 10 patch 0 Not Taking at Unknown time  . ondansetron (ZOFRAN) 8 MG tablet Take 1 tablet (8 mg total) by mouth 2 (two) times daily as needed for refractory nausea / vomiting. 30 tablet 4 unknown   Scheduled: . chlorhexidine gluconate (MEDLINE KIT)  15 mL Mouth Rinse BID  . Chlorhexidine Gluconate Cloth  6 each Topical Daily  . enoxaparin (LOVENOX) injection  60 mg Subcutaneous Q24H  . feeding supplement (PRO-STAT SUGAR FREE 64)  60  mL Per Tube TID  . feeding supplement (VITAL HIGH PROTEIN)  1,000 mL Per Tube Q24H  . gabapentin  900 mg Per Tube Q12H  . mouth rinse  15 mL Mouth Rinse 10 times per day  . methylPREDNISolone (SOLU-MEDROL) injection  40 mg Intravenous Q12H  . metoprolol tartrate  5 mg Intravenous Q8H  . mupirocin ointment   Nasal BID  . pantoprazole (PROTONIX) IV  40 mg Intravenous Q24H   Continuous: . sodium chloride 100 mL/hr at 04/11/19 9758  . fentaNYL infusion INTRAVENOUS 300 mcg/hr (04/11/19 0730)  . piperacillin-tazobactam (ZOSYN)  IV Stopped (04/11/19 0338)  . propofol (DIPRIVAN) infusion 45 mcg/kg/min (04/11/19 8325)  . vancomycin Stopped (04/11/19 0253)   QDI:[id number] **OR** acetaminophen, bisacodyl, fentaNYL, ipratropium-albuterol, midazolam, ondansetron **OR** ondansetron (ZOFRAN) IV  Assesment: She was admitted with sepsis felt to be related to pneumonia.  She no longer has sepsis pathophysiology.  She is on treatment for healthcare associated pneumonia  She has acute hypoxic respiratory failure requiring ventilator support.  She has very aggressive breast cancer with widespread metastatic disease including to her brain bones and significantly to her lungs.  She also has mediastinal adenopathy.  She has had essentially white out of her left lung which I think is a combination of tumor infiltration plus airway obstruction from metastatic disease and adenopathy  She has a malignant right pleural effusion and has a pleural catheter in place and that is being drained once a day.  Family has decided at this point to continue with intubation with  consideration for one-way extubation in the future.  Comfort is a priority Active Problems:   Sepsis ([place])   Sepsis due to pneumonia [facility])    Plan: Chest x-ray today to assess any changes.  If she does have one-way extubation she would need to continue with IV analgesia because of her bony metastatic disease and pain from that    LOS: 2  days   Sharon Floyd 04/11/2019, 7:31 AM

## 2019-04-12 ENCOUNTER — Ambulatory Visit: Payer: BC Managed Care – PPO

## 2019-04-12 ENCOUNTER — Inpatient Hospital Stay (HOSPITAL_COMMUNITY): Payer: BC Managed Care – PPO

## 2019-04-12 LAB — GLUCOSE, CAPILLARY
Glucose-Capillary: 103 mg/dL — ABNORMAL HIGH (ref 70–99)
Glucose-Capillary: 106 mg/dL — ABNORMAL HIGH (ref 70–99)
Glucose-Capillary: 93 mg/dL (ref 70–99)

## 2019-04-12 MED ORDER — LORAZEPAM 2 MG/ML IJ SOLN: 2.0000 mg/h | INTRAVENOUS | Status: DC

## 2019-04-12 MED ORDER — LORAZEPAM 2 MG/ML IJ SOLN
2.0000 mg | INTRAMUSCULAR | Status: DC
  Administered 2019-04-12: 2 mg via INTRAVENOUS
  Filled 2019-04-12: qty 1

## 2019-04-12 MED ORDER — LORAZEPAM 2 MG/ML IJ SOLN
2.0000 mg | INTRAMUSCULAR | Status: DC | PRN
  Administered 2019-04-12: 2 mg via INTRAVENOUS
  Filled 2019-04-12: qty 1

## 2019-04-12 MED ORDER — HYDROMORPHONE BOLUS VIA INFUSION
2.0000 mg | INTRAVENOUS | Status: DC | PRN
  Filled 2019-04-12: qty 4

## 2019-04-12 MED ORDER — LORAZEPAM BOLUS VIA INFUSION: 2.0000 mg | INTRAVENOUS | Status: DC | PRN

## 2019-04-12 MED ORDER — MIDAZOLAM BOLUS VIA INFUSION
2.0000 mg | INTRAVENOUS | Status: DC | PRN
  Filled 2019-04-12: qty 4

## 2019-04-12 MED ORDER — HYDROMORPHONE BOLUS VIA INFUSION
4.0000 mg | INTRAVENOUS | Status: DC | PRN
  Filled 2019-04-12: qty 6

## 2019-04-12 MED ORDER — FENTANYL 2500MCG IN NS 250ML (10MCG/ML) PREMIX INFUSION: 400.0000 ug/h | INTRAVENOUS | Status: DC

## 2019-04-12 MED ORDER — MIDAZOLAM 50MG/50ML (1MG/ML) PREMIX INFUSION
2.0000 mg/h | INTRAVENOUS | Status: DC
  Administered 2019-04-12: 09:00:00 2 mg/h via INTRAVENOUS
  Filled 2019-04-12: qty 50

## 2019-04-12 MED ORDER — SODIUM CHLORIDE 0.9 % IV SOLN
6.0000 mg/h | INTRAVENOUS | Status: DC
  Administered 2019-04-12: 4 mg/h via INTRAVENOUS
  Filled 2019-04-12: qty 10

## 2019-04-13 LAB — CULTURE, BLOOD (ROUTINE X 2)
Culture: NO GROWTH
Culture: NO GROWTH
Special Requests: ADEQUATE
Special Requests: ADEQUATE

## 2019-04-19 ENCOUNTER — Ambulatory Visit: Payer: Self-pay | Admitting: Hematology

## 2019-04-19 ENCOUNTER — Other Ambulatory Visit: Payer: Self-pay

## 2019-04-19 ENCOUNTER — Ambulatory Visit: Payer: Self-pay

## 2019-04-26 ENCOUNTER — Ambulatory Visit: Payer: Self-pay

## 2019-04-26 ENCOUNTER — Other Ambulatory Visit: Payer: Self-pay

## 2019-04-26 ENCOUNTER — Ambulatory Visit: Payer: Self-pay | Admitting: Hematology

## 2019-05-01 ENCOUNTER — Ambulatory Visit: Payer: BC Managed Care – PPO | Admitting: Hematology

## 2019-05-01 ENCOUNTER — Other Ambulatory Visit: Payer: BC Managed Care – PPO

## 2019-05-01 ENCOUNTER — Ambulatory Visit: Payer: BC Managed Care – PPO

## 2019-05-03 ENCOUNTER — Ambulatory Visit: Payer: BC Managed Care – PPO

## 2019-05-07 ENCOUNTER — Ambulatory Visit (HOSPITAL_COMMUNITY): Payer: BC Managed Care – PPO

## 2019-05-10 ENCOUNTER — Ambulatory Visit (HOSPITAL_COMMUNITY): Payer: BC Managed Care – PPO

## 2019-05-10 NOTE — Progress Notes (Signed)
Daily Progress Note   Patient Name: Sharon Floyd       Date: 04/22/19 DOB: [birth year]  Age: 43 y.o. MRN#: [id number] Attending Physician: Murlean Iba, MD Primary Care Physician: Baruch Gouty, FNP Admit Date: 03/17/2019  Reason for Consultation/Follow-up: Establishing goals of care and Terminal Care  Subjective: Unresponsive on multiple infusions to ensure comfort at EOL.   Length of Stay: 3  Current Medications: Scheduled Meds:  . chlorhexidine gluconate (MEDLINE KIT)  15 mL Mouth Rinse BID  . Chlorhexidine Gluconate Cloth  6 each Topical Daily  . glycopyrrolate  0.4 mg Intravenous Q4H  . LORazepam  2 mg Intravenous Q4H  . mouth rinse  15 mL Mouth Rinse 10 times per day  . methylPREDNISolone (SOLU-MEDROL) injection  40 mg Intravenous Q12H  . metoprolol tartrate  5 mg Intravenous Q8H  . mupirocin ointment   Nasal BID  . pantoprazole (PROTONIX) IV  40 mg Intravenous Q24H    Continuous Infusions: . sodium chloride 10 mL/hr at 04/11/19 2048  . fentaNYL infusion INTRAVENOUS 350 mcg/hr (04-22-19 0412)  . HYDROmorphone    . piperacillin-tazobactam (ZOSYN)  IV 3.375 g (2019/04/22 0822)  . propofol (DIPRIVAN) infusion 50 mcg/kg/min (2019/04/22 0558)  . vancomycin 1,000 mg (2019-04-22 0235)    PRN Meds: acetaminophen **OR** acetaminophen, bisacodyl, fentaNYL, HYDROmorphone, ipratropium-albuterol, LORazepam, ondansetron **OR** ondansetron (ZOFRAN) IV  Physical Exam Vitals signs and nursing note reviewed.  Constitutional:      General: She is in acute distress.     Appearance: She is morbidly obese. She is ill-appearing.     Interventions: She is intubated.     Comments: Restless on vent upon initial arrival  Cardiovascular:     Rate and Rhythm: Tachycardia present.   Pulmonary:     Effort: She is intubated.     Breath sounds: Decreased breath sounds and rhonchi present.  Abdominal:     Palpations: Abdomen is soft.  Neurological:     Mental Status: She is unresponsive.     Comments: Sedated for comfort at EOL             Vital Signs: BP 122/70   Pulse 100   Temp (!) 100.7 F (38.2 C) (Axillary)   Resp 19   Ht '5\' 4"'$  (1.626 m)   Wt 100.4 kg  SpO2 96%   BMI 37.99 kg/m  SpO2: SpO2: 96 % O2 Device: O2 Device: Ventilator O2 Flow Rate: O2 Flow Rate (L/min): 4 L/min  Intake/output summary:   Intake/Output Summary (Last 24 hours) at 04-16-19 0824 Last data filed at 04/11/2019 2048 Gross per 24 hour  Intake 2399.14 ml  Output 900 ml  Net 1499.14 ml   LBM: Last BM Date: 04/09/19 Baseline Weight: Weight: 109.8 kg Most recent weight: Weight: 100.4 kg       Palliative Assessment/Data:      Patient Active Problem List   Diagnosis Date Noted  . Sepsis ([place]) 04/09/2019  . Sepsis due to pneumonia ([place]) 04/09/2019  . Pneumonia due to infectious organism   . Thrombocytosis ([place])   . Antineoplastic chemotherapy induced anemia   . Acute on chronic respiratory failure with hypoxia ([place]) 03/06/2019  . Genetic testing 11/16/2018  . Normocytic anemia 11/03/2018  . Goals of care, counseling/discussion 11/03/2018  . Cancer-related pain 10/24/2018  . Metastasis to brain ([place])   . Pleural effusion, malignant 10/18/2018  . Elevated troponin 10/18/2018  . Malignant neoplasm of left female breast ([place])   . Acute respiratory failure with hypoxia ([place]) 10/17/2018  . Family history of breast cancer   . Family history of colon cancer   . Family history of cancer 10/12/2018  . Metastatic breast cancer ([place]) 10/10/2018    Palliative Care Assessment & Plan   HPI: 43 y.o.femalewith past medical history of progressing stage 4 breast cancer (mets to lung, brain, liver, bilateral pleural effusions, progressive thoracic adenopathy), s/p R PleurX  catheter, arthritisadmitted on 8/30/2020with worsening SOB requiring intubation after failing BiPAP trial.Complete left lung white out with progressing cancer and likely postobstructive pneumonia.Planning for potential one way extubation to comfort. 9/3 Extubated to comfort with subsequent death.   Assessment: I met today at [facility] bedside. Worked with RN and pharmacy to adjust medications to ensure comfort once extubated. She required multiple bolus and adjustments to medication to get her comfortable.   I spoke with mother, Tamela Oddi, and brother, Mliss Fritz. We discussed the process of extubation, comfort care, and signs at end of life that they may witness. Goals clarified for full comfort care. They remained at bedside during extubation and extubation went smoothly as she remained comfortably. Stayed with family for some time as we discussed Sam and her life and her son, Emory. They shared that Inocente Salles was a gifted Marine scientist and Optometrist. I explained to them signs of discomfort and apnea that has already began. Chaplain, Luz Brazen, also at bedside for support.   Family left the hospital around 1100 and Tamela Oddi was called by RN when HR began to drop. I held Sam's hand and reassured her that Tamela Oddi will take good care of Emory and he will be supported. Time of death pronounced at 71.   Recommendations/Plan:  Fentanyl, versed, and dilaudid infusions required multiple adjustments and boluses to achieve comfort prior to death.   Goals of Care and Additional Recommendations:  Limitations on Scope of Treatment: Full Comfort Care  Code Status:  DNR  Prognosis:   Hours - Days  Discharge Planning:  Anticipated Hospital Death  Care plan was discussed with Dr. Wynetta Emery, Dr. Luan Pulling, pharmacy, RN.   Thank you for allowing the Palliative Medicine Team to assist in the care of this patient.   Time In/Out: 0900-1050, 4008-6761 Total Time 120 min Prolonged Time Billed  yes       Greater than  50%  of this time was  spent counseling and coordinating care related to the above assessment and plan.  Kathreen Dileo, NP Palliative Medicine Team Pager # [phone] (M-F 8a-5p) Team Phone # [phone] (Nights/Weekends)      

## 2019-05-10 NOTE — Progress Notes (Signed)
CDS notified of death. Referral # [id number]. No further inquiries needed per CDS. OK for release.

## 2019-05-10 NOTE — Addendum Note (Signed)
Addendum  created April 25, 2019 1255 by Vista Deck, CRNA   Charge Capture section accepted

## 2019-05-10 NOTE — Progress Notes (Signed)
Nutrition Follow-up  Nutrition Brief Note  Chart reviewed. Pt now transitioning to comfort care.  No further nutrition interventions warranted at this time.  Please consult as needed.   Lajuan Lines, [place], [place] Office [phone] After Hours/Weekend Pager: [phone]

## 2019-05-10 NOTE — Discharge Summary (Signed)
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 Sepsis ([place]) . Sepsis due to pneumonia [facility])  Hospital Course: 43 year old female with metastatic inflammatory breast cancer presented with acute on chronic respiratory failure and subsequently intubated in the hospital due to worsening shortness of breath.  Unfortunately her breast cancer has not been responsive to aggressive chemotherapy and treatments and according to her oncologist she is no longer a candidate for further treatment given that it is a not expected to improve her prognosis.  Patient is being treated for presumed postobstructive pneumonia and after family has been meeting with palliative medicine they have elected to focus on comfort care and now understand that patient is unlikely to have meaningful improvement.  1. Severe sepsis secondary to postobstructive pneumonia-patient has been treated maximally with inpatient treatments and not having any significant or meaningful improvements.  Patient appears terminal and will be extubated with family present and full comfort care will begin.  Pt expired at 1224 pm.  2. Acute hypoxemic respiratory failure - multifactorial but likely secondary to metastatic breast cancer.  Appreciate assistance from pulmonology team.  3. 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 Maylon Peppers.  Full comfort care to begin. Pt expired 2019/05/12 at 1224 pm.   Signed: Irwin Brakeman 2019-05-12, 12:33 PM  How to contact the [facility] Attending or Consulting provider [place] or  covering provider during after hours [place], for this patient?  1. Check the care team in [facility] and look for a) attending/consulting TRH provider listed and b) the [facility] team listed 2. Log into [email or web address] and use [place]'s universal password to access. If you do not have the password, please contact the hospital operator. 3. Locate the [facility] provider you are looking for under [facility] and page to a number that you can be directly reached. 4. If you still have difficulty reaching the provider, please page the [facility] (Director on Call) for the Hospitalists listed on amion for assistance.

## 2019-05-10 NOTE — Progress Notes (Signed)
Present with Ms Nordmann's mother, Sharon Floyd and step brother, Reggie for emotional and spiritual support before and after her extubation.

## 2019-05-10 NOTE — Progress Notes (Signed)
Plan for extubation and full comfort care today.  I will plan to sign off.  Thanks for allowing me to participate in her care

## 2019-05-10 NOTE — Progress Notes (Signed)
At 1057HR patient was extubated for comfort care. RT extubated patient to 4L nasal cannula O2. Patient family, RN and chaplain at bedside.

## 2019-05-10 NOTE — Progress Notes (Signed)
PROGRESS NOTE  Sharon Floyd  KAJ:[id number]  DOB: October 19, 1975  DOA: 03/31/2019 PCP: Baruch Gouty, FNP  Brief Admission Hx: 43 year old female with metastatic inflammatory breast cancer presented with acute on chronic respiratory failure and subsequently intubated in the hospital due to worsening shortness of breath.  Unfortunately her breast cancer has not been responsive to aggressive chemotherapy and treatments and according to her oncologist she is no longer a candidate for further treatment given that it is a not expected to improve her prognosis.  Patient is being treated for presumed postobstructive pneumonia and after family has been meeting with palliative medicine they have elected to focus on comfort care and now understand that patient is unlikely to have meaningful improvement.  MDM/Assessment & Plan:   1. Severe sepsis secondary to postobstructive pneumonia-patient has been treated maximally with inpatient treatments and not having any significant or meaningful improvements.  Patient appears terminal and will be extubated with family present and full comfort care will begin. 2. Acute hypoxemic respiratory failure - multifactorial but likely secondary to metastatic breast cancer.  Appreciate assistance from pulmonology team.  3. 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 Maylon Peppers.  Full comfort care to begin.    Code Status: DNR  Disposition Plan: extubate and start full comfort care   Consultants:  Palliative medicine  Pulmonology  Procedures:    Subjective: Pt fully sedated on vent  Objective: Vitals:   04-25-19 0645 Apr 25, 2019 0700 04/25/2019 0805 2019/04/25 1000  BP: 118/64 122/70  139/62  Pulse: (!) 102 100  (!) 118  Resp: _0 Temp:   (!) 100.7 F (38.2 C)   TempSrc:   Axillary   SpO2: 97% 96%  (!) 78%  Weight:       Height:        Intake/Output Summary (Last 24 hours) at Apr 25, 2019 1116 Last data filed at 25-Apr-2019 1000 Gross per 24 hour  Intake 3702.76 ml  Output 900 ml  Net 2802.76 ml   Filed Weights   04/10/19 0430 04/11/19 0500 2019-04-25 0500  Weight: 96.2 kg 98 kg 100.4 kg   REVIEW OF SYSTEMS  UTO due to condition   Exam:  General exam: critically ill female intubated on vent.  Sedated.  Respiratory system: poor air movement.  Cardiovascular system: S1 & S2 heard. No JVD, murmurs, gallops, clicks or pedal edema. Gastrointestinal system: Abdomen is nondistended, soft and nontender. Normal bowel sounds heard. Central nervous system:Sedated. No focal neurological deficits. Extremities: no CCE.  Data Reviewed: Basic Metabolic Panel: Recent Labs  Lab 03/20/2019 2146 04/09/19 0721 04/10/19 0426 04/11/19 0453  NA 140 139 139 140  K 4.2 4.0 4.6 4.1  CL 101 100 101 107  CO2 _1 GLUCOSE 96 99 108* 115*  BUN _2 24*  CREATININE 0.85 0.86 0.89 0.84  CALCIUM 9.1 9.0 8.7* 8.5*  MG  --  2.2  --   --    Liver Function Tests: Recent Labs  Lab 03/19/2019 2146 04/09/19 0721 04/10/19 0426 04/11/19 0453  AST 49* 46* 56* 52*  ALT _3 36  ALKPHOS 145* 142* 197* 149*  BILITOT 0.3 0.2* 0.3 0.4  PROT 6.9 7.0 6.6 6.4*  ALBUMIN 2.4* 2.3* 2.1* 2.1*   No results for input(s): LIPASE, AMYLASE in the last 168 hours. No results for input(s): AMMONIA in the last 168  hours. CBC: Recent Labs  Lab 04/09/2019 2146 04/10/19 0426 04/11/19 0453  WBC 9.7 10.0 15.0*  NEUTROABS 8.1*  --   --   HGB 10.5* 9.5* 9.2*  HCT 36.3 33.2* 32.2*  MCV 89.6 91.0 91.5  PLT 548* 554* 538*   Cardiac Enzymes: No results for input(s): CKTOTAL, CKMB, CKMBINDEX, TROPONINI in the last 168 hours. CBG (last 3)  Recent Labs    2019-05-05 0044 2019-05-05 0451 05-05-2019 0804  GLUCAP 106* 103* 93   Recent Results (from the past 240 hour(s))  Urine culture     Status: Abnormal   Collection Time: 03/19/2019   9:24 PM   Specimen: Urine, Catheterized  Result Value Ref Range Status   Specimen Description   Final    URINE, CATHETERIZED Performed at [facility], [address], [place], [place] [address]    Special Requests   Final    NONE Performed at [facility], [address] [place], [place] [address]    Culture MULTIPLE SPECIES PRESENT, SUGGEST RECOLLECTION (A)  Final   Report Status 04/10/2019 FINAL  Final  Blood culture (routine x 2)     Status: None (Preliminary result)   Collection Time: 03/17/2019  9:46 PM   Specimen: A-Line; Blood  Result Value Ref Range Status   Specimen Description A-LINE  Final   Special Requests   Final    BOTTLES DRAWN AEROBIC AND ANAEROBIC Blood Culture adequate volume   Culture   Final    NO GROWTH 4 DAYS Performed at [facility], [address], [place], [place] [address]    Report Status PENDING  Incomplete  Blood culture (routine x 2)     Status: None (Preliminary result)   Collection Time: 03/12/2019  9:46 PM   Specimen: A-Line; Blood  Result Value Ref Range Status   Specimen Description A-LINE  Final   Special Requests   Final    BOTTLES DRAWN AEROBIC AND ANAEROBIC Blood Culture adequate volume   Culture   Final    NO GROWTH 4 DAYS Performed at [facility], [address], [place], [place] [address]    Report Status PENDING  Incomplete  SARS Coronavirus 2 [facility] order, Performed in [place] hospital lab) Nasopharyngeal Nasopharyngeal Swab     Status: None   Collection Time: 04/06/2019 11:25 PM   Specimen: Nasopharyngeal Swab  Result Value Ref Range Status   SARS Coronavirus 2 NEGATIVE NEGATIVE Final    Comment: (NOTE) If result is NEGATIVE SARS-CoV-2 target nucleic acids are NOT DETECTED. The SARS-CoV-2 RNA is generally detectable in upper and lower  respiratory specimens during the acute phase of infection. The lowest  concentration of SARS-CoV-2 viral copies this assay can detect is 250  copies / mL. A negative result does not  preclude SARS-CoV-2 infection  and should not be used as the sole basis for treatment or other  patient management decisions.  A negative result may occur with  improper specimen collection / handling, submission of specimen other  than nasopharyngeal swab, presence of viral mutation(s) within the  areas targeted by this assay, and inadequate number of viral copies  (<250 copies / mL). A negative result must be combined with clinical  observations, patient history, and epidemiological information. If result is POSITIVE SARS-CoV-2 target nucleic acids are DETECTED. The SARS-CoV-2 RNA is generally detectable in upper and lower  respiratory specimens dur ing the acute phase of infection.  Positive  results are indicative of active infection with SARS-CoV-2.  Clinical  correlation  with patient history and other diagnostic information is  necessary to determine patient infection status.  Positive results do  not rule out bacterial infection or co-infection with other viruses. If result is PRESUMPTIVE POSTIVE SARS-CoV-2 nucleic acids MAY BE PRESENT.   A presumptive positive result was obtained on the submitted specimen  and confirmed on repeat testing.  While 2019 novel coronavirus  (SARS-CoV-2) nucleic acids may be present in the submitted sample  additional confirmatory testing may be necessary for epidemiological  and / or clinical management purposes  to differentiate between  SARS-CoV-2 and other Sarbecovirus currently known to infect humans.  If clinically indicated additional testing with an alternate test  methodology [phone]) is advised. The SARS-CoV-2 RNA is generally  detectable in upper and lower respiratory sp ecimens during the acute  phase of infection. The expected result is Negative. Fact Sheet for Patients:  StrictlyIdeas.no Fact Sheet for Healthcare Providers: BankingDealers.co.za This test is not yet approved or cleared by  the [place] FDA and has been authorized for detection and/or diagnosis of SARS-CoV-2 by FDA under an Emergency Use Authorization (EUA).  This EUA will remain in effect (meaning this test can be used) for the duration of the COVID-19 declaration under Section 564(b)(1) of the Act, 21 U.S.C. section 360bbb-3(b)(1), unless the authorization is terminated or revoked sooner. Performed at [facility], [address], [place], [place] [address]   MRSA PCR Screening     Status: Abnormal   Collection Time: 04/09/19  6:18 AM   Specimen: Nasal Mucosa; Nasopharyngeal  Result Value Ref Range Status   MRSA by PCR POSITIVE (A) NEGATIVE Final    Comment:        The GeneXpert MRSA Assay (FDA approved for NASAL specimens only), is one component of a comprehensive MRSA colonization surveillance program. It is not intended to diagnose MRSA infection nor to guide or monitor treatment for MRSA infections. CALLED TO [facility] AT 5329 BY HFLYNT 04/09/19 Performed at [facility], [address], [place],  [address]      Studies: Dg Chest [facility] 1 View  Result Date: 2019/05/04 CLINICAL DATA:  Respiratory failure. EXAM: PORTABLE CHEST 1 VIEW COMPARISON:  CTA chest 04/09/2019.  One-view chest x-ray 04/11/2019 FINDINGS: Cardiac shadow is obscured. There is opacification of left hemithorax. The endotracheal tube terminates 2 cm from the carina. NG tube courses off the inferior border the film. Right IJ Port-A-Cath is in place. Right infrahilar mass is again seen. Additional nodules are present in the right lung. IMPRESSION: 1. Support apparatus stable. 2. Stable appearance of opacified left hemithorax, right infrahilar mass, and right upper lobe nodules. Electronically Signed   By: San Morelle M.D.   On: May 04, 2019 08:03   Dg Chest Port 1 View  Result Date: 04/11/2019 CLINICAL DATA:  43 year old female with history of metastatic breast cancer. EXAM: PORTABLE CHEST 1 VIEW COMPARISON:   Chest radiograph dated 04/10/2019 FINDINGS: Endotracheal tube with tip above the carina, enteric tube extending below the diaphragm with tip beyond the inferior margin of the image, and right-sided Port-A-Cath similar position as before. There is complete opacification of the left hemithorax. There is an area of consolidative change in the right lower lung field similar to prior radiograph corresponding to the known mass. There is mild vascular congestion. No pneumothorax identified. Right lung base atelectasis. A small right pleural effusion may be present. IMPRESSION: Overall no significant interval change since the prior radiograph when accounting for patient's positioning. Electronically Signed   By: Milas Hock  Radparvar M.D.   On: 04/11/2019 10:02   Scheduled Meds: . chlorhexidine gluconate (MEDLINE KIT)  15 mL Mouth Rinse BID  . Chlorhexidine Gluconate Cloth  6 each Topical Daily  . glycopyrrolate  0.4 mg Intravenous Q4H   Continuous Infusions: . sodium chloride Stopped (2019-04-15 0828)  . fentaNYL infusion INTRAVENOUS    . HYDROmorphone 6 mg/hr (Apr 15, 2019 1000)  . midazolam 2 mg/hr (2019/04/15 1000)   Active Problems:   Sepsis ([place])   Sepsis due to pneumonia [facility])  Critical Care Time spent: 30 mins  Irwin Brakeman, MD [facility] 04-15-19, 11:16 AM    LOS: 3 days  How to contact the [facility] Attending or Consulting provider [place] or covering provider during after hours [place], for this patient?  1. Check the care team in [facility] and look for a) attending/consulting TRH provider listed and b) the [facility] team listed 2. Log into [email or web address] and use [place]'s universal password to access. If you do not have the password, please contact the hospital operator. 3. Locate the [facility] provider you are looking for under [facility] and page to a number that you can be directly reached. 4. If you still have difficulty reaching the provider, please page the [facility] (Director on Call) for the  Hospitalists listed on amion for assistance.

## 2019-05-10 DEATH — deceased

## 2019-05-17 ENCOUNTER — Ambulatory Visit: Payer: Self-pay

## 2019-05-17 ENCOUNTER — Other Ambulatory Visit: Payer: Self-pay

## 2019-05-17 ENCOUNTER — Ambulatory Visit: Payer: Self-pay | Admitting: Hematology

## 2019-05-22 ENCOUNTER — Other Ambulatory Visit: Payer: BC Managed Care – PPO

## 2019-05-22 ENCOUNTER — Ambulatory Visit: Payer: BC Managed Care – PPO

## 2019-05-22 ENCOUNTER — Ambulatory Visit: Payer: BC Managed Care – PPO | Admitting: Hematology

## 2019-05-24 ENCOUNTER — Ambulatory Visit: Payer: BC Managed Care – PPO

## 2020-12-17 IMAGING — MR MRI HEAD WITHOUT AND WITH CONTRAST
11 series · 48 of 48 positions shown · IV contrast (20 ml multihance)
Comparison: 10/18/2018

CLINICAL DATA: Metastatic breast cancer. Recent discovery of a
right frontal metastasis. S RS planning.

EXAM:
MRI HEAD WITHOUT AND WITH CONTRAST
TECHNIQUE: Multiplanar, multiecho pulse sequences of the brain and surrounding
structures were obtained without and with intravenous contrast.
CONTRAST:  20mL MULTIHANCE GADOBENATE DIMEGLUMINE 529 MG/ML IV SOLN

[Series 2: FLAIR · sagittal · 3.0mm · 0.75mm/px · 2 of 39 slices shown (1 of 2)]
[im 1/39]
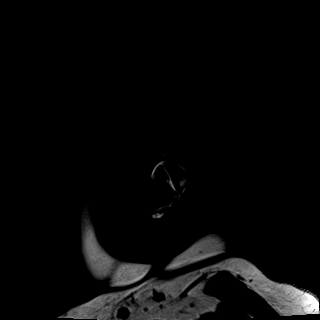
[im 39/39]
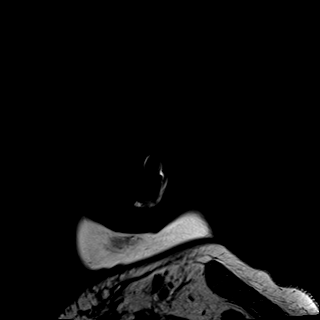

[Series 3: DWI · axial · 3.0mm · 1.50mm/px · z∈[-63,+84]mm · 5 of 78 slices shown (1 of 2)]
[im 1/78]
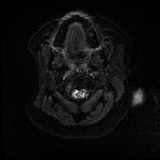
[im 20/78]
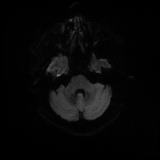
[im 39/78]
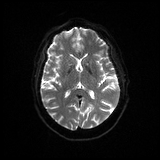
[im 58/78]
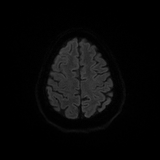
[im 78/78]
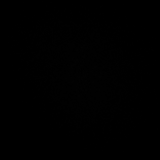

[Series 4: DWI · axial · 3.0mm · 1.50mm/px · z∈[-63,+80]mm · 2 of 38 slices shown (2 of 2)]
[im 1/38]
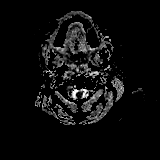
[im 38/38]
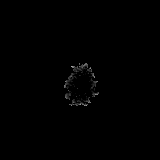

[Series 5: T2 · axial · 5.0mm · 0.57mm/px · z∈[-68,+87]mm · 2 of 27 slices shown]
[im 1/27]
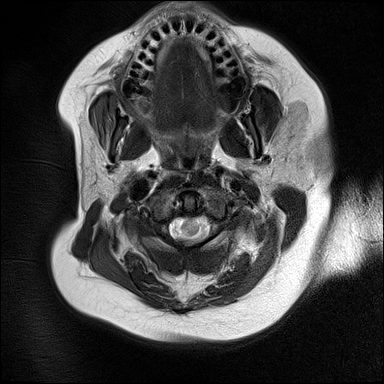
[im 27/27]
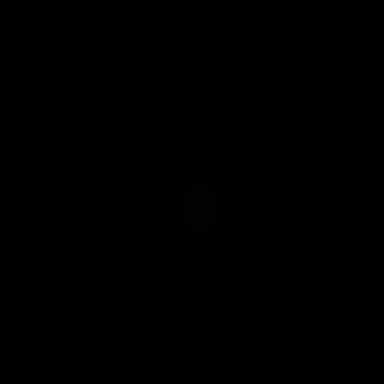

[Series 8: FLAIR · axial · 3.0mm · 0.57mm/px · z∈[-81,+72]mm · 3 of 52 slices shown (2 of 2)]
[im 1/52]
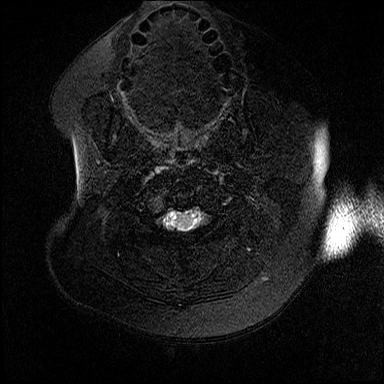
[im 26/52]
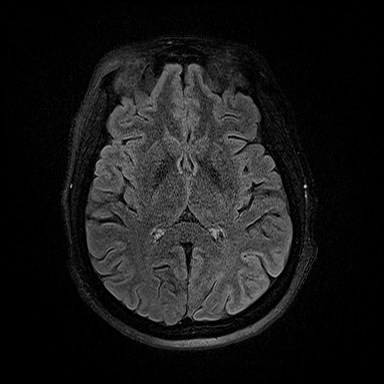
[im 52/52]
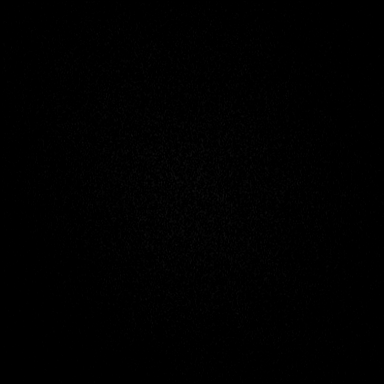

[Series 9: T1 · axial · 1.0mm · 0.75mm/px · z∈[-84,+75]mm · 10 of 160 slices shown]
[im 1/160]
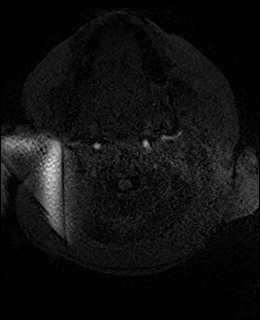
[im 18/160]
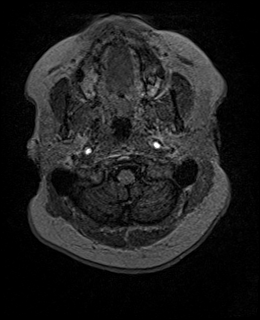
[im 36/160]
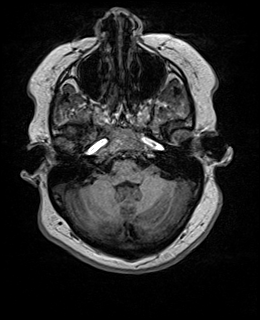
[im 54/160]
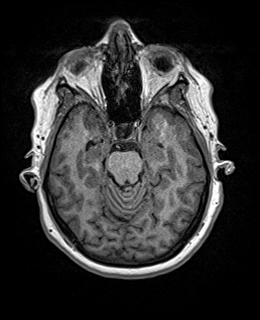
[im 71/160]
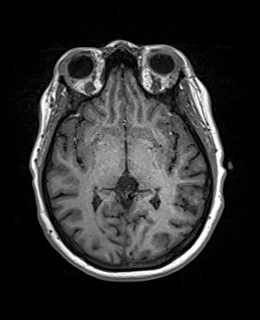
[im 89/160]
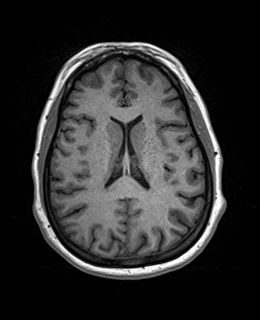
[im 107/160]
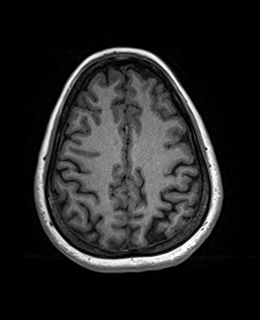
[im 124/160]
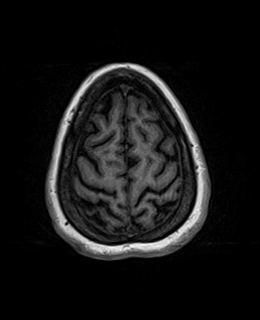
[im 142/160]
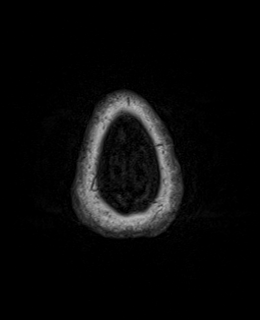
[im 160/160]
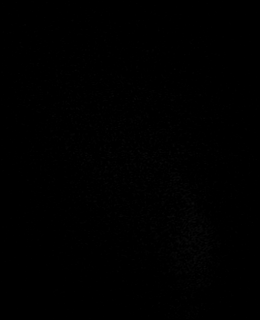

[Series 11: swi_images · axial · 1.5mm · 0.90mm/px · z∈[-61,+80]mm · 6 of 96 slices shown]
[im 1/96]
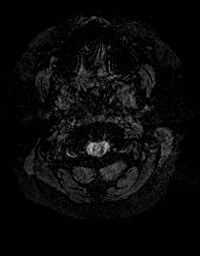
[im 20/96]
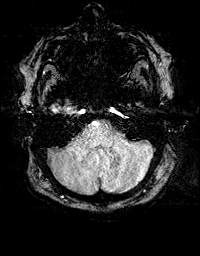
[im 39/96]
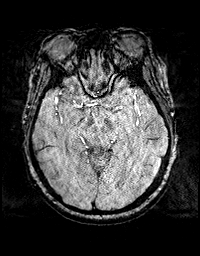
[im 58/96]
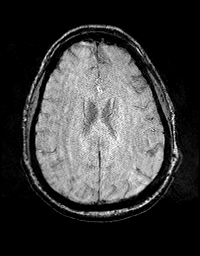
[im 77/96]
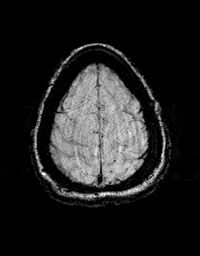
[im 96/96]
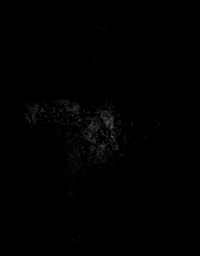

[Series 12: T2 post-contrast · coronal · 3.0mm · 0.57mm/px · 3 of 47 slices shown]
[im 1/47]
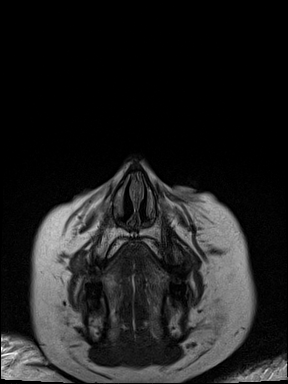
[im 24/47]
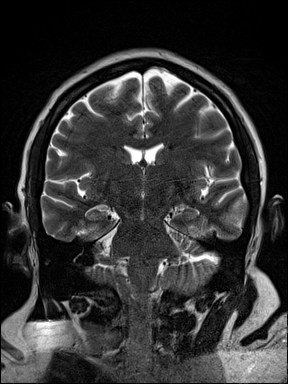
[im 47/47]
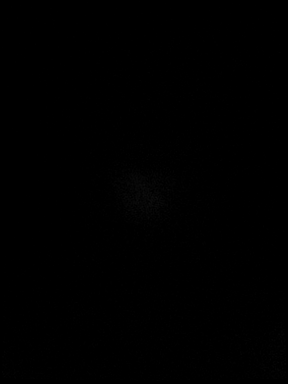

[Series 13: T1 post-contrast · axial · 1.0mm · 0.75mm/px · z∈[-84,+75]mm · 10 of 160 slices shown (1 of 2)]
[im 1/160]
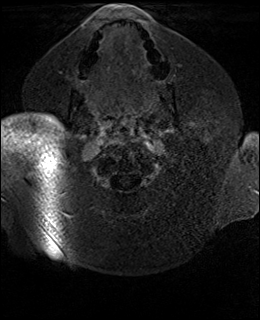
[im 18/160]
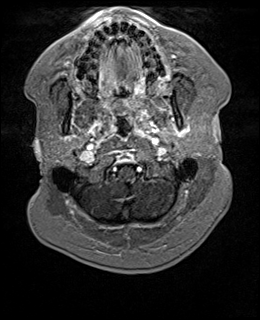
[im 36/160]
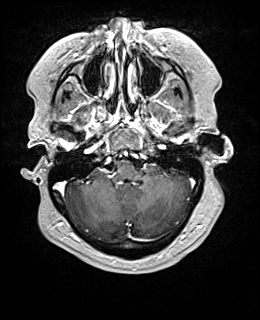
[im 54/160]
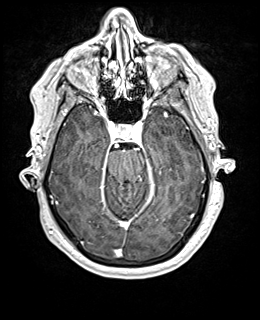
[im 71/160]
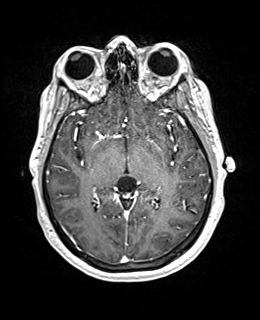
[im 89/160]
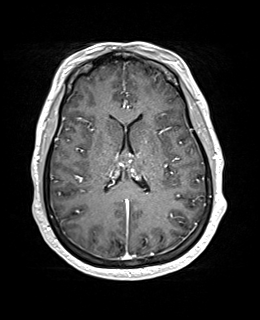
[im 107/160]
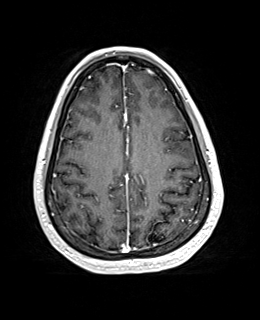
[im 124/160]
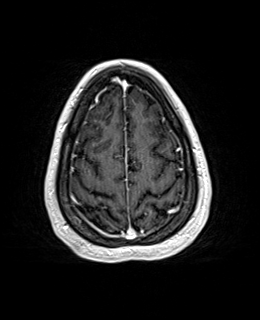
[im 142/160]
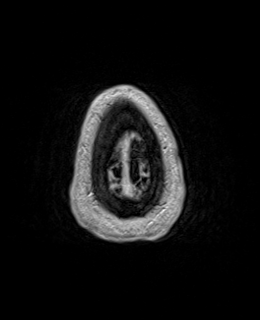
[im 160/160]
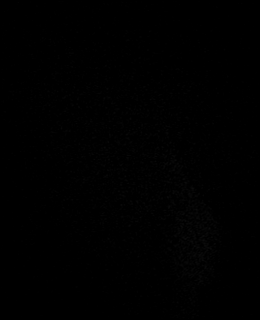

[Series 14: T1 post-contrast · coronal · 3.0mm · 0.57mm/px · 3 of 47 slices shown (2 of 2)]
[im 1/47]
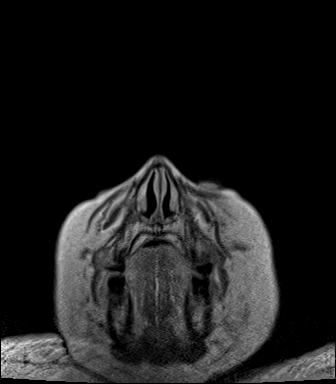
[im 24/47]
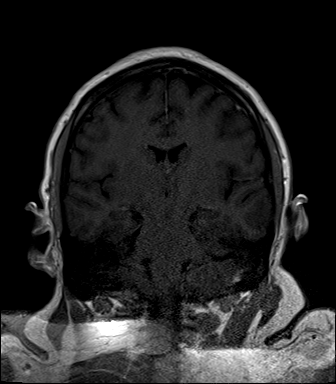
[im 47/47]
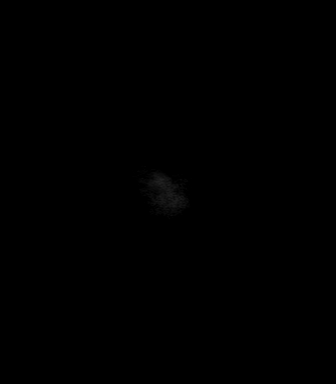

[Series 15: FLAIR post-contrast · sagittal · 3.0mm · 0.75mm/px · 2 of 39 slices shown]
[im 1/39]
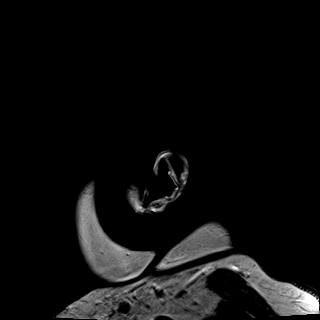
[im 39/39]
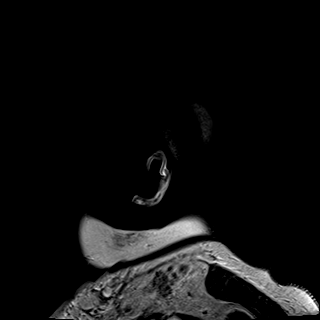

[48 of 48 positions shown; findings below may reference images not displayed]

FINDINGS: Brain: Today's study suffers from some motion degradation. No change
is appreciated compared to the previous study. Brain has normal
appearance with exception of a 4 mm metastasis in the right frontal
white matter, without mass effect or edema. No second lesion. No
hydrocephalus or extra-axial collection. No skull or skull base
lesion is seen.

Vascular: Major vessels at the base of the brain show flow.

Skull and upper cervical spine: Negative

Sinuses/Orbits: Clear/normal

Other: None
IMPRESSION: No change since the previous study. Single 4 mm metastasis in the
right frontal white matter. Axial image 100, series [DATE].
